# Patient Record
Sex: Male | Born: 1935 | Race: White | Hispanic: No | Smoking: Never smoker
Health system: Southern US, Community
[De-identification: ages and names within clinical notes are randomized; demographics above are authoritative.]

## PROBLEM LIST (undated history)

## (undated) DIAGNOSIS — M25559 Pain in unspecified hip: Secondary | ICD-10-CM

## (undated) DIAGNOSIS — I10 Essential (primary) hypertension: Secondary | ICD-10-CM

## (undated) DIAGNOSIS — K5900 Constipation, unspecified: Secondary | ICD-10-CM

## (undated) HISTORY — PX: BACK SURGERY: SHX140

---

## 2003-10-25 ENCOUNTER — Ambulatory Visit (HOSPITAL_COMMUNITY): Admission: RE | Admit: 2003-10-25 | Discharge: 2003-10-25 | Payer: Self-pay | Admitting: Family Medicine

## 2008-05-24 ENCOUNTER — Encounter: Admission: RE | Admit: 2008-05-24 | Discharge: 2008-05-24 | Payer: Self-pay | Admitting: Family Medicine

## 2015-06-11 DIAGNOSIS — N400 Enlarged prostate without lower urinary tract symptoms: Secondary | ICD-10-CM | POA: Diagnosis not present

## 2015-06-11 DIAGNOSIS — I10 Essential (primary) hypertension: Secondary | ICD-10-CM | POA: Diagnosis not present

## 2015-06-11 DIAGNOSIS — R252 Cramp and spasm: Secondary | ICD-10-CM | POA: Diagnosis not present

## 2015-06-21 DIAGNOSIS — M5416 Radiculopathy, lumbar region: Secondary | ICD-10-CM | POA: Diagnosis not present

## 2015-07-18 DIAGNOSIS — M5136 Other intervertebral disc degeneration, lumbar region: Secondary | ICD-10-CM | POA: Diagnosis not present

## 2015-08-06 DIAGNOSIS — M4806 Spinal stenosis, lumbar region: Secondary | ICD-10-CM | POA: Diagnosis not present

## 2015-08-06 DIAGNOSIS — M4126 Other idiopathic scoliosis, lumbar region: Secondary | ICD-10-CM | POA: Diagnosis not present

## 2015-08-06 DIAGNOSIS — M5136 Other intervertebral disc degeneration, lumbar region: Secondary | ICD-10-CM | POA: Diagnosis not present

## 2015-08-15 DIAGNOSIS — M545 Low back pain: Secondary | ICD-10-CM | POA: Diagnosis not present

## 2015-08-21 DIAGNOSIS — M4806 Spinal stenosis, lumbar region: Secondary | ICD-10-CM | POA: Diagnosis not present

## 2015-08-21 DIAGNOSIS — M5136 Other intervertebral disc degeneration, lumbar region: Secondary | ICD-10-CM | POA: Diagnosis not present

## 2015-08-21 DIAGNOSIS — M4126 Other idiopathic scoliosis, lumbar region: Secondary | ICD-10-CM | POA: Diagnosis not present

## 2015-08-28 DIAGNOSIS — M5136 Other intervertebral disc degeneration, lumbar region: Secondary | ICD-10-CM | POA: Diagnosis not present

## 2015-09-02 DIAGNOSIS — Z0000 Encounter for general adult medical examination without abnormal findings: Secondary | ICD-10-CM | POA: Diagnosis not present

## 2015-09-02 DIAGNOSIS — N138 Other obstructive and reflux uropathy: Secondary | ICD-10-CM | POA: Diagnosis not present

## 2015-09-02 DIAGNOSIS — N401 Enlarged prostate with lower urinary tract symptoms: Secondary | ICD-10-CM | POA: Diagnosis not present

## 2015-09-02 DIAGNOSIS — N281 Cyst of kidney, acquired: Secondary | ICD-10-CM | POA: Diagnosis not present

## 2015-09-02 DIAGNOSIS — N433 Hydrocele, unspecified: Secondary | ICD-10-CM | POA: Diagnosis not present

## 2015-09-09 DIAGNOSIS — N281 Cyst of kidney, acquired: Secondary | ICD-10-CM | POA: Diagnosis not present

## 2015-10-01 DIAGNOSIS — M4806 Spinal stenosis, lumbar region: Secondary | ICD-10-CM | POA: Diagnosis not present

## 2015-10-15 DIAGNOSIS — D3001 Benign neoplasm of right kidney: Secondary | ICD-10-CM | POA: Diagnosis not present

## 2015-10-24 DIAGNOSIS — I10 Essential (primary) hypertension: Secondary | ICD-10-CM | POA: Diagnosis not present

## 2015-10-24 DIAGNOSIS — E782 Mixed hyperlipidemia: Secondary | ICD-10-CM | POA: Diagnosis not present

## 2015-10-24 DIAGNOSIS — K219 Gastro-esophageal reflux disease without esophagitis: Secondary | ICD-10-CM | POA: Diagnosis not present

## 2015-10-24 DIAGNOSIS — E559 Vitamin D deficiency, unspecified: Secondary | ICD-10-CM | POA: Diagnosis not present

## 2015-10-24 DIAGNOSIS — N400 Enlarged prostate without lower urinary tract symptoms: Secondary | ICD-10-CM | POA: Diagnosis not present

## 2015-10-24 DIAGNOSIS — M5416 Radiculopathy, lumbar region: Secondary | ICD-10-CM | POA: Diagnosis not present

## 2015-10-31 DIAGNOSIS — M5416 Radiculopathy, lumbar region: Secondary | ICD-10-CM | POA: Diagnosis not present

## 2015-11-13 DIAGNOSIS — M4806 Spinal stenosis, lumbar region: Secondary | ICD-10-CM | POA: Diagnosis not present

## 2015-12-02 ENCOUNTER — Other Ambulatory Visit: Payer: Self-pay | Admitting: Family Medicine

## 2015-12-02 ENCOUNTER — Ambulatory Visit
Admission: RE | Admit: 2015-12-02 | Discharge: 2015-12-02 | Disposition: A | Discharge status: Home / Self Care | Payer: Medicare Other | Source: Ambulatory Visit | Attending: Family Medicine | Admitting: Family Medicine

## 2015-12-02 DIAGNOSIS — K5900 Constipation, unspecified: Secondary | ICD-10-CM

## 2015-12-02 DIAGNOSIS — I10 Essential (primary) hypertension: Secondary | ICD-10-CM | POA: Diagnosis not present

## 2015-12-02 DIAGNOSIS — M5416 Radiculopathy, lumbar region: Secondary | ICD-10-CM | POA: Diagnosis not present

## 2015-12-19 DIAGNOSIS — H01024 Squamous blepharitis left upper eyelid: Secondary | ICD-10-CM | POA: Diagnosis not present

## 2015-12-19 DIAGNOSIS — H01022 Squamous blepharitis right lower eyelid: Secondary | ICD-10-CM | POA: Diagnosis not present

## 2015-12-19 DIAGNOSIS — H01025 Squamous blepharitis left lower eyelid: Secondary | ICD-10-CM | POA: Diagnosis not present

## 2015-12-19 DIAGNOSIS — H01021 Squamous blepharitis right upper eyelid: Secondary | ICD-10-CM | POA: Diagnosis not present

## 2015-12-19 DIAGNOSIS — H26491 Other secondary cataract, right eye: Secondary | ICD-10-CM | POA: Diagnosis not present

## 2015-12-19 DIAGNOSIS — Z961 Presence of intraocular lens: Secondary | ICD-10-CM | POA: Diagnosis not present

## 2015-12-19 DIAGNOSIS — H04123 Dry eye syndrome of bilateral lacrimal glands: Secondary | ICD-10-CM | POA: Diagnosis not present

## 2015-12-19 DIAGNOSIS — H10413 Chronic giant papillary conjunctivitis, bilateral: Secondary | ICD-10-CM | POA: Diagnosis not present

## 2015-12-27 DIAGNOSIS — H26491 Other secondary cataract, right eye: Secondary | ICD-10-CM | POA: Diagnosis not present

## 2016-01-14 DIAGNOSIS — M5416 Radiculopathy, lumbar region: Secondary | ICD-10-CM | POA: Diagnosis not present

## 2016-02-20 ENCOUNTER — Encounter: Payer: Self-pay | Admitting: Internal Medicine

## 2016-04-16 DIAGNOSIS — L570 Actinic keratosis: Secondary | ICD-10-CM | POA: Diagnosis not present

## 2016-04-16 DIAGNOSIS — D1801 Hemangioma of skin and subcutaneous tissue: Secondary | ICD-10-CM | POA: Diagnosis not present

## 2016-04-16 DIAGNOSIS — L821 Other seborrheic keratosis: Secondary | ICD-10-CM | POA: Diagnosis not present

## 2016-04-16 DIAGNOSIS — Z85828 Personal history of other malignant neoplasm of skin: Secondary | ICD-10-CM | POA: Diagnosis not present

## 2016-04-16 DIAGNOSIS — L814 Other melanin hyperpigmentation: Secondary | ICD-10-CM | POA: Diagnosis not present

## 2016-04-16 DIAGNOSIS — D225 Melanocytic nevi of trunk: Secondary | ICD-10-CM | POA: Diagnosis not present

## 2016-04-21 ENCOUNTER — Ambulatory Visit: Payer: Medicare Other | Admitting: Internal Medicine

## 2016-04-21 DIAGNOSIS — K219 Gastro-esophageal reflux disease without esophagitis: Secondary | ICD-10-CM | POA: Diagnosis not present

## 2016-04-21 DIAGNOSIS — Z23 Encounter for immunization: Secondary | ICD-10-CM | POA: Diagnosis not present

## 2016-04-21 DIAGNOSIS — N400 Enlarged prostate without lower urinary tract symptoms: Secondary | ICD-10-CM | POA: Diagnosis not present

## 2016-04-21 DIAGNOSIS — E559 Vitamin D deficiency, unspecified: Secondary | ICD-10-CM | POA: Diagnosis not present

## 2016-04-21 DIAGNOSIS — E782 Mixed hyperlipidemia: Secondary | ICD-10-CM | POA: Diagnosis not present

## 2016-04-21 DIAGNOSIS — M5416 Radiculopathy, lumbar region: Secondary | ICD-10-CM | POA: Diagnosis not present

## 2016-04-21 DIAGNOSIS — I10 Essential (primary) hypertension: Secondary | ICD-10-CM | POA: Diagnosis not present

## 2016-05-11 DIAGNOSIS — M7138 Other bursal cyst, other site: Secondary | ICD-10-CM | POA: Diagnosis not present

## 2016-05-11 DIAGNOSIS — M5126 Other intervertebral disc displacement, lumbar region: Secondary | ICD-10-CM | POA: Diagnosis not present

## 2016-05-12 ENCOUNTER — Other Ambulatory Visit: Payer: Self-pay | Admitting: Neurosurgery

## 2016-05-12 DIAGNOSIS — M7138 Other bursal cyst, other site: Secondary | ICD-10-CM

## 2016-05-16 ENCOUNTER — Ambulatory Visit
Admission: RE | Admit: 2016-05-16 | Discharge: 2016-05-16 | Disposition: A | Discharge status: Home / Self Care | Payer: Medicare Other | Source: Ambulatory Visit | Attending: Neurosurgery | Admitting: Neurosurgery

## 2016-05-16 DIAGNOSIS — M48061 Spinal stenosis, lumbar region without neurogenic claudication: Secondary | ICD-10-CM | POA: Diagnosis not present

## 2016-05-16 DIAGNOSIS — M7138 Other bursal cyst, other site: Secondary | ICD-10-CM

## 2016-06-12 DIAGNOSIS — M7138 Other bursal cyst, other site: Secondary | ICD-10-CM | POA: Diagnosis not present

## 2016-06-12 DIAGNOSIS — M5126 Other intervertebral disc displacement, lumbar region: Secondary | ICD-10-CM | POA: Diagnosis not present

## 2016-07-23 ENCOUNTER — Other Ambulatory Visit: Payer: Self-pay | Admitting: Nurse Practitioner

## 2016-07-23 DIAGNOSIS — M5126 Other intervertebral disc displacement, lumbar region: Secondary | ICD-10-CM

## 2016-07-23 DIAGNOSIS — M7138 Other bursal cyst, other site: Secondary | ICD-10-CM | POA: Diagnosis not present

## 2016-07-24 ENCOUNTER — Ambulatory Visit
Admission: RE | Admit: 2016-07-24 | Discharge: 2016-07-24 | Disposition: A | Discharge status: Home / Self Care | Payer: Medicare Other | Source: Ambulatory Visit | Attending: Nurse Practitioner | Admitting: Nurse Practitioner

## 2016-07-24 DIAGNOSIS — M5126 Other intervertebral disc displacement, lumbar region: Secondary | ICD-10-CM | POA: Diagnosis not present

## 2016-07-24 MED ORDER — METHYLPREDNISOLONE ACETATE 40 MG/ML INJ SUSP (RADIOLOG
120.0000 mg | Freq: Once | INTRAMUSCULAR | Status: AC
  Administered 2016-07-24: 120 mg via EPIDURAL

## 2016-07-24 MED ORDER — IOPAMIDOL (ISOVUE-M 200) INJECTION 41%
1.0000 mL | Freq: Once | INTRAMUSCULAR | Status: AC
  Administered 2016-07-24: 1 mL via EPIDURAL

## 2016-07-24 NOTE — Discharge Instructions (Signed)

## 2016-08-06 DIAGNOSIS — M5126 Other intervertebral disc displacement, lumbar region: Secondary | ICD-10-CM | POA: Diagnosis not present

## 2016-08-06 DIAGNOSIS — M7138 Other bursal cyst, other site: Secondary | ICD-10-CM | POA: Diagnosis not present

## 2016-08-14 DIAGNOSIS — Z7982 Long term (current) use of aspirin: Secondary | ICD-10-CM | POA: Diagnosis not present

## 2016-08-14 DIAGNOSIS — Z79899 Other long term (current) drug therapy: Secondary | ICD-10-CM | POA: Diagnosis not present

## 2016-08-14 DIAGNOSIS — E875 Hyperkalemia: Secondary | ICD-10-CM | POA: Diagnosis not present

## 2016-08-14 DIAGNOSIS — M47816 Spondylosis without myelopathy or radiculopathy, lumbar region: Secondary | ICD-10-CM | POA: Diagnosis not present

## 2016-08-14 DIAGNOSIS — N400 Enlarged prostate without lower urinary tract symptoms: Secondary | ICD-10-CM | POA: Diagnosis not present

## 2016-08-14 DIAGNOSIS — M48061 Spinal stenosis, lumbar region without neurogenic claudication: Secondary | ICD-10-CM | POA: Diagnosis not present

## 2016-08-14 DIAGNOSIS — M6281 Muscle weakness (generalized): Secondary | ICD-10-CM | POA: Diagnosis not present

## 2016-08-14 DIAGNOSIS — R262 Difficulty in walking, not elsewhere classified: Secondary | ICD-10-CM | POA: Diagnosis not present

## 2016-08-14 DIAGNOSIS — M5126 Other intervertebral disc displacement, lumbar region: Secondary | ICD-10-CM | POA: Diagnosis not present

## 2016-08-14 DIAGNOSIS — I10 Essential (primary) hypertension: Secondary | ICD-10-CM | POA: Diagnosis not present

## 2016-08-17 DIAGNOSIS — R262 Difficulty in walking, not elsewhere classified: Secondary | ICD-10-CM | POA: Diagnosis not present

## 2016-08-17 DIAGNOSIS — I10 Essential (primary) hypertension: Secondary | ICD-10-CM | POA: Diagnosis not present

## 2016-08-17 DIAGNOSIS — M5136 Other intervertebral disc degeneration, lumbar region: Secondary | ICD-10-CM | POA: Diagnosis not present

## 2016-08-17 DIAGNOSIS — Z79899 Other long term (current) drug therapy: Secondary | ICD-10-CM | POA: Diagnosis not present

## 2016-08-17 DIAGNOSIS — Z7982 Long term (current) use of aspirin: Secondary | ICD-10-CM | POA: Diagnosis not present

## 2016-08-17 DIAGNOSIS — M6281 Muscle weakness (generalized): Secondary | ICD-10-CM | POA: Diagnosis not present

## 2016-08-17 DIAGNOSIS — M48061 Spinal stenosis, lumbar region without neurogenic claudication: Secondary | ICD-10-CM | POA: Diagnosis not present

## 2016-08-17 DIAGNOSIS — E875 Hyperkalemia: Secondary | ICD-10-CM | POA: Diagnosis not present

## 2016-08-17 DIAGNOSIS — M5126 Other intervertebral disc displacement, lumbar region: Secondary | ICD-10-CM | POA: Diagnosis not present

## 2016-08-17 DIAGNOSIS — N400 Enlarged prostate without lower urinary tract symptoms: Secondary | ICD-10-CM | POA: Diagnosis not present

## 2016-08-17 DIAGNOSIS — M47816 Spondylosis without myelopathy or radiculopathy, lumbar region: Secondary | ICD-10-CM | POA: Diagnosis not present

## 2016-08-18 DIAGNOSIS — M48061 Spinal stenosis, lumbar region without neurogenic claudication: Secondary | ICD-10-CM | POA: Diagnosis not present

## 2016-08-18 DIAGNOSIS — M47816 Spondylosis without myelopathy or radiculopathy, lumbar region: Secondary | ICD-10-CM | POA: Diagnosis not present

## 2016-08-18 DIAGNOSIS — Z79899 Other long term (current) drug therapy: Secondary | ICD-10-CM | POA: Diagnosis not present

## 2016-08-18 DIAGNOSIS — R262 Difficulty in walking, not elsewhere classified: Secondary | ICD-10-CM | POA: Diagnosis not present

## 2016-08-18 DIAGNOSIS — I10 Essential (primary) hypertension: Secondary | ICD-10-CM | POA: Diagnosis not present

## 2016-08-18 DIAGNOSIS — M5126 Other intervertebral disc displacement, lumbar region: Secondary | ICD-10-CM | POA: Diagnosis not present

## 2016-08-18 DIAGNOSIS — N400 Enlarged prostate without lower urinary tract symptoms: Secondary | ICD-10-CM | POA: Diagnosis not present

## 2016-08-18 DIAGNOSIS — M6281 Muscle weakness (generalized): Secondary | ICD-10-CM | POA: Diagnosis not present

## 2016-08-18 DIAGNOSIS — Z7982 Long term (current) use of aspirin: Secondary | ICD-10-CM | POA: Diagnosis not present

## 2016-08-18 DIAGNOSIS — E875 Hyperkalemia: Secondary | ICD-10-CM | POA: Diagnosis not present

## 2016-08-19 DIAGNOSIS — M5126 Other intervertebral disc displacement, lumbar region: Secondary | ICD-10-CM | POA: Diagnosis not present

## 2016-08-19 DIAGNOSIS — R262 Difficulty in walking, not elsewhere classified: Secondary | ICD-10-CM | POA: Diagnosis not present

## 2016-08-19 DIAGNOSIS — M519 Unspecified thoracic, thoracolumbar and lumbosacral intervertebral disc disorder: Secondary | ICD-10-CM | POA: Diagnosis not present

## 2016-08-19 DIAGNOSIS — I10 Essential (primary) hypertension: Secondary | ICD-10-CM | POA: Diagnosis not present

## 2016-08-19 DIAGNOSIS — Z7982 Long term (current) use of aspirin: Secondary | ICD-10-CM | POA: Diagnosis not present

## 2016-08-19 DIAGNOSIS — M47816 Spondylosis without myelopathy or radiculopathy, lumbar region: Secondary | ICD-10-CM | POA: Diagnosis not present

## 2016-08-19 DIAGNOSIS — Z79899 Other long term (current) drug therapy: Secondary | ICD-10-CM | POA: Diagnosis not present

## 2016-08-19 DIAGNOSIS — M48061 Spinal stenosis, lumbar region without neurogenic claudication: Secondary | ICD-10-CM | POA: Diagnosis not present

## 2016-08-19 DIAGNOSIS — M6281 Muscle weakness (generalized): Secondary | ICD-10-CM | POA: Diagnosis not present

## 2016-08-19 DIAGNOSIS — N400 Enlarged prostate without lower urinary tract symptoms: Secondary | ICD-10-CM | POA: Diagnosis not present

## 2016-08-19 DIAGNOSIS — E875 Hyperkalemia: Secondary | ICD-10-CM | POA: Diagnosis not present

## 2016-08-20 DIAGNOSIS — M5126 Other intervertebral disc displacement, lumbar region: Secondary | ICD-10-CM | POA: Diagnosis not present

## 2016-08-20 DIAGNOSIS — R262 Difficulty in walking, not elsewhere classified: Secondary | ICD-10-CM | POA: Diagnosis not present

## 2016-08-20 DIAGNOSIS — N400 Enlarged prostate without lower urinary tract symptoms: Secondary | ICD-10-CM | POA: Diagnosis not present

## 2016-08-20 DIAGNOSIS — E875 Hyperkalemia: Secondary | ICD-10-CM | POA: Diagnosis not present

## 2016-08-20 DIAGNOSIS — M47816 Spondylosis without myelopathy or radiculopathy, lumbar region: Secondary | ICD-10-CM | POA: Diagnosis not present

## 2016-08-20 DIAGNOSIS — I10 Essential (primary) hypertension: Secondary | ICD-10-CM | POA: Diagnosis not present

## 2016-08-20 DIAGNOSIS — Z79899 Other long term (current) drug therapy: Secondary | ICD-10-CM | POA: Diagnosis not present

## 2016-08-20 DIAGNOSIS — M6281 Muscle weakness (generalized): Secondary | ICD-10-CM | POA: Diagnosis not present

## 2016-08-20 DIAGNOSIS — Z7982 Long term (current) use of aspirin: Secondary | ICD-10-CM | POA: Diagnosis not present

## 2016-08-20 DIAGNOSIS — M48061 Spinal stenosis, lumbar region without neurogenic claudication: Secondary | ICD-10-CM | POA: Diagnosis not present

## 2016-08-21 DIAGNOSIS — Z79899 Other long term (current) drug therapy: Secondary | ICD-10-CM | POA: Diagnosis not present

## 2016-08-21 DIAGNOSIS — M48061 Spinal stenosis, lumbar region without neurogenic claudication: Secondary | ICD-10-CM | POA: Diagnosis not present

## 2016-08-21 DIAGNOSIS — G629 Polyneuropathy, unspecified: Secondary | ICD-10-CM | POA: Diagnosis not present

## 2016-08-21 DIAGNOSIS — R278 Other lack of coordination: Secondary | ICD-10-CM | POA: Diagnosis not present

## 2016-08-21 DIAGNOSIS — M47816 Spondylosis without myelopathy or radiculopathy, lumbar region: Secondary | ICD-10-CM | POA: Diagnosis not present

## 2016-08-21 DIAGNOSIS — M5126 Other intervertebral disc displacement, lumbar region: Secondary | ICD-10-CM | POA: Diagnosis not present

## 2016-08-21 DIAGNOSIS — Z7982 Long term (current) use of aspirin: Secondary | ICD-10-CM | POA: Diagnosis not present

## 2016-08-21 DIAGNOSIS — R2689 Other abnormalities of gait and mobility: Secondary | ICD-10-CM | POA: Diagnosis not present

## 2016-08-21 DIAGNOSIS — Z4789 Encounter for other orthopedic aftercare: Secondary | ICD-10-CM | POA: Diagnosis not present

## 2016-08-21 DIAGNOSIS — R262 Difficulty in walking, not elsewhere classified: Secondary | ICD-10-CM | POA: Diagnosis not present

## 2016-08-21 DIAGNOSIS — M6281 Muscle weakness (generalized): Secondary | ICD-10-CM | POA: Diagnosis not present

## 2016-08-21 DIAGNOSIS — E875 Hyperkalemia: Secondary | ICD-10-CM | POA: Diagnosis not present

## 2016-08-21 DIAGNOSIS — N400 Enlarged prostate without lower urinary tract symptoms: Secondary | ICD-10-CM | POA: Diagnosis not present

## 2016-08-21 DIAGNOSIS — M47896 Other spondylosis, lumbar region: Secondary | ICD-10-CM | POA: Diagnosis not present

## 2016-08-21 DIAGNOSIS — I10 Essential (primary) hypertension: Secondary | ICD-10-CM | POA: Diagnosis not present

## 2016-09-07 DIAGNOSIS — Z981 Arthrodesis status: Secondary | ICD-10-CM | POA: Diagnosis not present

## 2016-09-07 DIAGNOSIS — Z4789 Encounter for other orthopedic aftercare: Secondary | ICD-10-CM | POA: Diagnosis not present

## 2016-09-07 DIAGNOSIS — N400 Enlarged prostate without lower urinary tract symptoms: Secondary | ICD-10-CM | POA: Diagnosis not present

## 2016-09-07 DIAGNOSIS — R2689 Other abnormalities of gait and mobility: Secondary | ICD-10-CM | POA: Diagnosis not present

## 2016-09-07 DIAGNOSIS — I10 Essential (primary) hypertension: Secondary | ICD-10-CM | POA: Diagnosis not present

## 2016-09-15 DIAGNOSIS — N400 Enlarged prostate without lower urinary tract symptoms: Secondary | ICD-10-CM | POA: Diagnosis not present

## 2016-09-15 DIAGNOSIS — R609 Edema, unspecified: Secondary | ICD-10-CM | POA: Diagnosis not present

## 2016-09-15 DIAGNOSIS — M5416 Radiculopathy, lumbar region: Secondary | ICD-10-CM | POA: Diagnosis not present

## 2016-09-15 DIAGNOSIS — I10 Essential (primary) hypertension: Secondary | ICD-10-CM | POA: Diagnosis not present

## 2016-09-24 DIAGNOSIS — E785 Hyperlipidemia, unspecified: Secondary | ICD-10-CM | POA: Diagnosis not present

## 2016-09-24 DIAGNOSIS — I4510 Unspecified right bundle-branch block: Secondary | ICD-10-CM | POA: Diagnosis not present

## 2016-09-24 DIAGNOSIS — R609 Edema, unspecified: Secondary | ICD-10-CM | POA: Diagnosis not present

## 2016-09-24 DIAGNOSIS — R9431 Abnormal electrocardiogram [ECG] [EKG]: Secondary | ICD-10-CM | POA: Diagnosis not present

## 2016-09-24 DIAGNOSIS — I10 Essential (primary) hypertension: Secondary | ICD-10-CM | POA: Diagnosis not present

## 2016-10-05 DIAGNOSIS — R600 Localized edema: Secondary | ICD-10-CM | POA: Diagnosis not present

## 2016-10-08 IMAGING — CR DG ABDOMEN 2V
2 series · 2 of 2 positions shown · non-contrast
Comparison: CT abdomen 09/09/2015

CLINICAL DATA: Constipation

EXAM:
ABDOMEN - 2 VIEW

[w abdomen upright *]
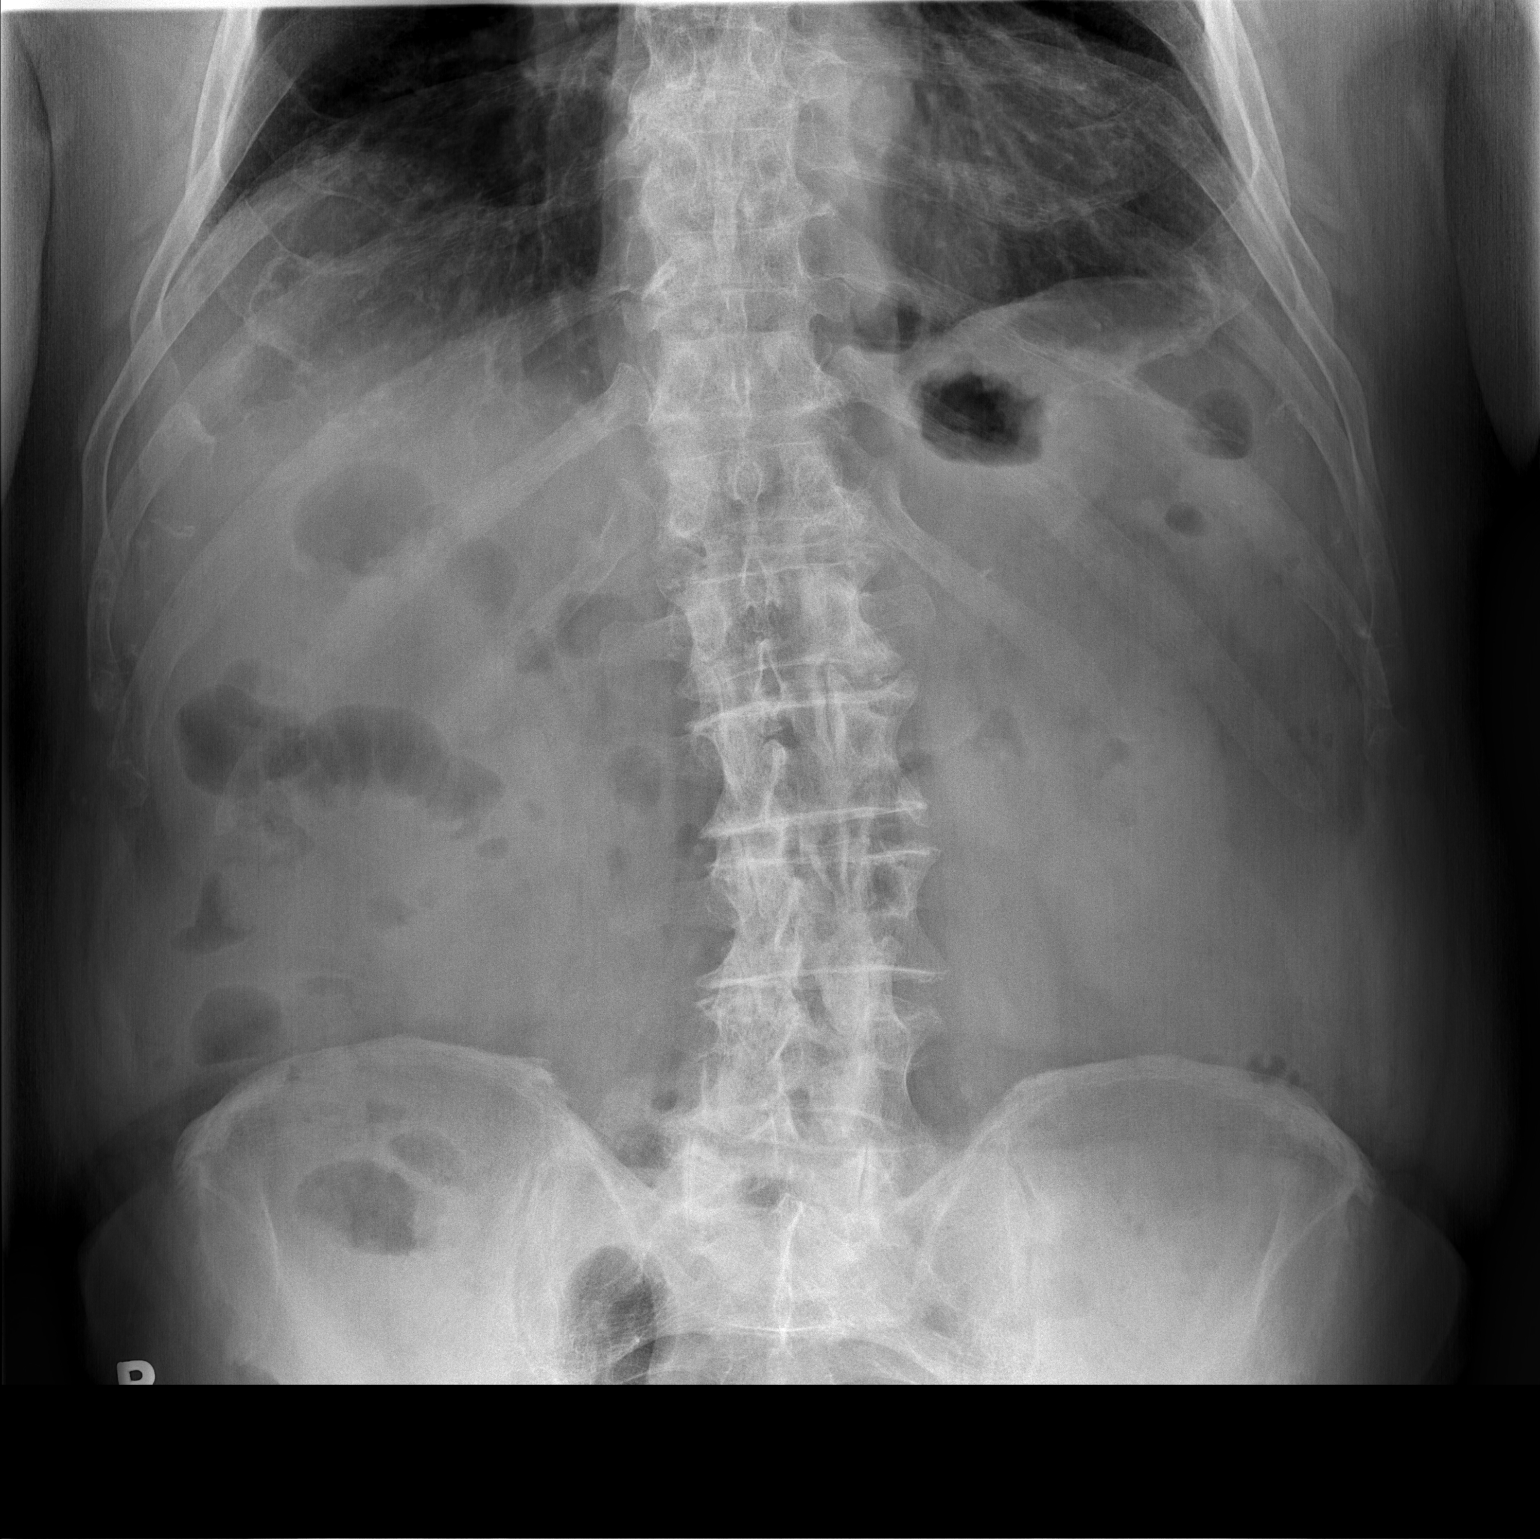

[t abdomen supine]
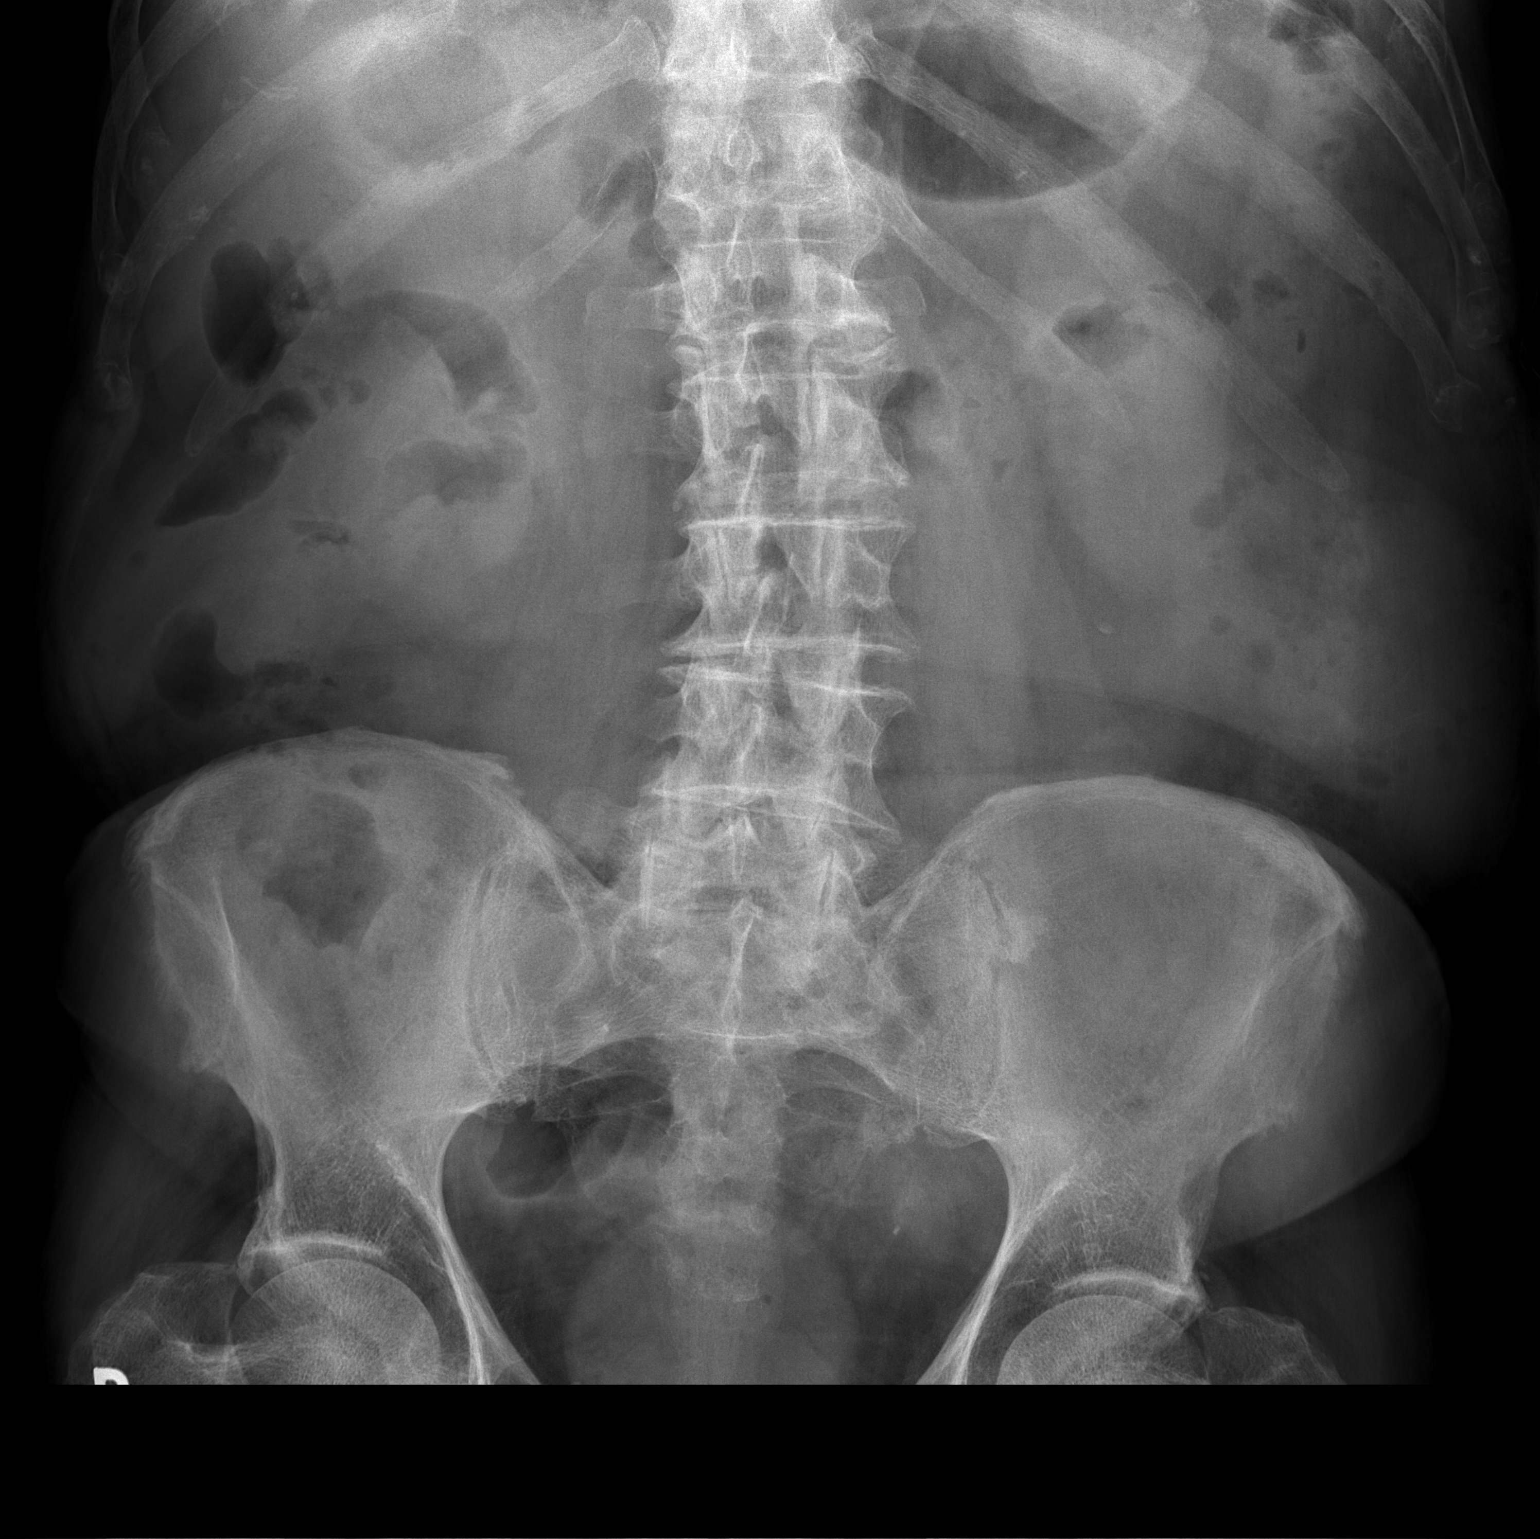

[2 of 2 positions shown; findings below may reference images not displayed]

FINDINGS: Nonobstructive bowel gas pattern. No free air. No dilated bowel
loops.

Lumbar levoscoliosis. No acute bony abnormality. No urinary tract
calculi.
IMPRESSION: Negative.

## 2016-10-15 DIAGNOSIS — L570 Actinic keratosis: Secondary | ICD-10-CM | POA: Diagnosis not present

## 2016-10-15 DIAGNOSIS — D1801 Hemangioma of skin and subcutaneous tissue: Secondary | ICD-10-CM | POA: Diagnosis not present

## 2016-10-15 DIAGNOSIS — D225 Melanocytic nevi of trunk: Secondary | ICD-10-CM | POA: Diagnosis not present

## 2016-10-15 DIAGNOSIS — L821 Other seborrheic keratosis: Secondary | ICD-10-CM | POA: Diagnosis not present

## 2016-10-27 DIAGNOSIS — I4510 Unspecified right bundle-branch block: Secondary | ICD-10-CM | POA: Diagnosis not present

## 2016-10-27 DIAGNOSIS — R609 Edema, unspecified: Secondary | ICD-10-CM | POA: Diagnosis not present

## 2016-10-27 DIAGNOSIS — I119 Hypertensive heart disease without heart failure: Secondary | ICD-10-CM | POA: Diagnosis not present

## 2016-10-27 DIAGNOSIS — R9431 Abnormal electrocardiogram [ECG] [EKG]: Secondary | ICD-10-CM | POA: Diagnosis not present

## 2016-10-28 ENCOUNTER — Other Ambulatory Visit: Payer: Self-pay | Admitting: Cardiology

## 2016-10-28 DIAGNOSIS — M7989 Other specified soft tissue disorders: Secondary | ICD-10-CM

## 2016-10-28 DIAGNOSIS — M79669 Pain in unspecified lower leg: Secondary | ICD-10-CM

## 2016-10-29 ENCOUNTER — Ambulatory Visit (HOSPITAL_COMMUNITY)
Admission: RE | Admit: 2016-10-29 | Discharge: 2016-10-29 | Disposition: A | Discharge status: Home / Self Care | Payer: Medicare Other | Source: Ambulatory Visit | Attending: Vascular Surgery | Admitting: Vascular Surgery

## 2016-10-29 DIAGNOSIS — M79661 Pain in right lower leg: Secondary | ICD-10-CM | POA: Insufficient documentation

## 2016-10-29 DIAGNOSIS — M7989 Other specified soft tissue disorders: Secondary | ICD-10-CM | POA: Diagnosis not present

## 2016-10-29 DIAGNOSIS — M79669 Pain in unspecified lower leg: Secondary | ICD-10-CM | POA: Diagnosis not present

## 2016-10-29 DIAGNOSIS — M79662 Pain in left lower leg: Secondary | ICD-10-CM | POA: Diagnosis present

## 2017-02-02 DIAGNOSIS — R9431 Abnormal electrocardiogram [ECG] [EKG]: Secondary | ICD-10-CM | POA: Diagnosis not present

## 2017-02-02 DIAGNOSIS — I4510 Unspecified right bundle-branch block: Secondary | ICD-10-CM | POA: Diagnosis not present

## 2017-02-02 DIAGNOSIS — R609 Edema, unspecified: Secondary | ICD-10-CM | POA: Diagnosis not present

## 2017-02-02 DIAGNOSIS — I119 Hypertensive heart disease without heart failure: Secondary | ICD-10-CM | POA: Diagnosis not present

## 2017-02-23 DIAGNOSIS — L570 Actinic keratosis: Secondary | ICD-10-CM | POA: Diagnosis not present

## 2017-02-23 DIAGNOSIS — C44311 Basal cell carcinoma of skin of nose: Secondary | ICD-10-CM | POA: Diagnosis not present

## 2017-02-23 DIAGNOSIS — D485 Neoplasm of uncertain behavior of skin: Secondary | ICD-10-CM | POA: Diagnosis not present

## 2017-03-18 DIAGNOSIS — I10 Essential (primary) hypertension: Secondary | ICD-10-CM | POA: Diagnosis not present

## 2017-03-18 DIAGNOSIS — N400 Enlarged prostate without lower urinary tract symptoms: Secondary | ICD-10-CM | POA: Diagnosis not present

## 2017-03-18 DIAGNOSIS — R609 Edema, unspecified: Secondary | ICD-10-CM | POA: Diagnosis not present

## 2017-03-18 DIAGNOSIS — E559 Vitamin D deficiency, unspecified: Secondary | ICD-10-CM | POA: Diagnosis not present

## 2017-03-23 DIAGNOSIS — M4716 Other spondylosis with myelopathy, lumbar region: Secondary | ICD-10-CM | POA: Diagnosis not present

## 2017-04-19 DIAGNOSIS — D229 Melanocytic nevi, unspecified: Secondary | ICD-10-CM | POA: Diagnosis not present

## 2017-04-19 DIAGNOSIS — L821 Other seborrheic keratosis: Secondary | ICD-10-CM | POA: Diagnosis not present

## 2017-04-19 DIAGNOSIS — Z85828 Personal history of other malignant neoplasm of skin: Secondary | ICD-10-CM | POA: Diagnosis not present

## 2017-05-11 DIAGNOSIS — D225 Melanocytic nevi of trunk: Secondary | ICD-10-CM | POA: Diagnosis not present

## 2017-05-11 DIAGNOSIS — L570 Actinic keratosis: Secondary | ICD-10-CM | POA: Diagnosis not present

## 2017-05-11 DIAGNOSIS — L821 Other seborrheic keratosis: Secondary | ICD-10-CM | POA: Diagnosis not present

## 2017-06-18 DIAGNOSIS — I10 Essential (primary) hypertension: Secondary | ICD-10-CM | POA: Diagnosis not present

## 2017-06-18 DIAGNOSIS — N400 Enlarged prostate without lower urinary tract symptoms: Secondary | ICD-10-CM | POA: Diagnosis not present

## 2017-06-18 DIAGNOSIS — E559 Vitamin D deficiency, unspecified: Secondary | ICD-10-CM | POA: Diagnosis not present

## 2017-07-23 DIAGNOSIS — I10 Essential (primary) hypertension: Secondary | ICD-10-CM | POA: Diagnosis not present

## 2017-08-03 DIAGNOSIS — I4510 Unspecified right bundle-branch block: Secondary | ICD-10-CM | POA: Diagnosis not present

## 2017-08-03 DIAGNOSIS — I872 Venous insufficiency (chronic) (peripheral): Secondary | ICD-10-CM | POA: Diagnosis not present

## 2017-08-03 DIAGNOSIS — I119 Hypertensive heart disease without heart failure: Secondary | ICD-10-CM | POA: Diagnosis not present

## 2017-08-03 DIAGNOSIS — R609 Edema, unspecified: Secondary | ICD-10-CM | POA: Diagnosis not present

## 2017-08-19 DIAGNOSIS — R609 Edema, unspecified: Secondary | ICD-10-CM | POA: Diagnosis not present

## 2017-08-19 DIAGNOSIS — I10 Essential (primary) hypertension: Secondary | ICD-10-CM | POA: Diagnosis not present

## 2017-08-19 DIAGNOSIS — R1310 Dysphagia, unspecified: Secondary | ICD-10-CM | POA: Diagnosis not present

## 2017-08-31 DIAGNOSIS — D3132 Benign neoplasm of left choroid: Secondary | ICD-10-CM | POA: Diagnosis not present

## 2017-08-31 DIAGNOSIS — H02103 Unspecified ectropion of right eye, unspecified eyelid: Secondary | ICD-10-CM | POA: Diagnosis not present

## 2017-08-31 DIAGNOSIS — Z961 Presence of intraocular lens: Secondary | ICD-10-CM | POA: Diagnosis not present

## 2017-08-31 DIAGNOSIS — H26492 Other secondary cataract, left eye: Secondary | ICD-10-CM | POA: Diagnosis not present

## 2017-09-08 DIAGNOSIS — H26492 Other secondary cataract, left eye: Secondary | ICD-10-CM | POA: Diagnosis not present

## 2017-11-08 DIAGNOSIS — D1801 Hemangioma of skin and subcutaneous tissue: Secondary | ICD-10-CM | POA: Diagnosis not present

## 2017-11-08 DIAGNOSIS — L821 Other seborrheic keratosis: Secondary | ICD-10-CM | POA: Diagnosis not present

## 2017-11-08 DIAGNOSIS — L814 Other melanin hyperpigmentation: Secondary | ICD-10-CM | POA: Diagnosis not present

## 2017-11-08 DIAGNOSIS — D229 Melanocytic nevi, unspecified: Secondary | ICD-10-CM | POA: Diagnosis not present

## 2017-12-16 DIAGNOSIS — N400 Enlarged prostate without lower urinary tract symptoms: Secondary | ICD-10-CM | POA: Diagnosis not present

## 2017-12-16 DIAGNOSIS — M5416 Radiculopathy, lumbar region: Secondary | ICD-10-CM | POA: Diagnosis not present

## 2017-12-16 DIAGNOSIS — E559 Vitamin D deficiency, unspecified: Secondary | ICD-10-CM | POA: Diagnosis not present

## 2017-12-16 DIAGNOSIS — I10 Essential (primary) hypertension: Secondary | ICD-10-CM | POA: Diagnosis not present

## 2018-02-10 DIAGNOSIS — M79605 Pain in left leg: Secondary | ICD-10-CM | POA: Diagnosis not present

## 2018-02-10 DIAGNOSIS — Z23 Encounter for immunization: Secondary | ICD-10-CM | POA: Diagnosis not present

## 2018-02-11 ENCOUNTER — Telehealth: Payer: Self-pay

## 2018-02-11 DIAGNOSIS — M7989 Other specified soft tissue disorders: Secondary | ICD-10-CM | POA: Diagnosis not present

## 2018-02-16 ENCOUNTER — Telehealth: Payer: Self-pay | Admitting: Cardiology

## 2018-02-16 NOTE — Telephone Encounter (Signed)
° ° ° °  1. Which medications need to be refilled? (please list name of each medication and dose if known) furosemide 20mg  tablets  2. Which pharmacy/location (including street and city if local pharmacy) is medication to be sent to? [facility] on [address] [place]  3. Do they need a 30 day or 90 day supply? 90 day

## 2018-02-17 ENCOUNTER — Other Ambulatory Visit: Payer: Self-pay

## 2018-02-17 MED ORDER — FUROSEMIDE 20 MG PO TABS: 20.0000 mg | ORAL_TABLET | Freq: Every day | ORAL | 0 refills | Status: DC

## 2018-02-17 NOTE — Telephone Encounter (Signed)
Refill has been sent.  °

## 2018-02-22 DIAGNOSIS — M25562 Pain in left knee: Secondary | ICD-10-CM | POA: Diagnosis not present

## 2018-03-18 NOTE — Telephone Encounter (Signed)
Refill already sent.

## 2018-03-22 DIAGNOSIS — M5432 Sciatica, left side: Secondary | ICD-10-CM | POA: Diagnosis not present

## 2018-03-22 DIAGNOSIS — M7122 Synovial cyst of popliteal space [Baker], left knee: Secondary | ICD-10-CM | POA: Diagnosis not present

## 2018-03-22 DIAGNOSIS — I10 Essential (primary) hypertension: Secondary | ICD-10-CM | POA: Diagnosis not present

## 2018-03-23 ENCOUNTER — Other Ambulatory Visit: Payer: Self-pay | Admitting: Family Medicine

## 2018-03-23 DIAGNOSIS — M5432 Sciatica, left side: Secondary | ICD-10-CM

## 2018-04-03 ENCOUNTER — Ambulatory Visit
Admission: RE | Admit: 2018-04-03 | Discharge: 2018-04-03 | Disposition: A | Discharge status: Home / Self Care | Payer: Medicare Other | Source: Ambulatory Visit | Attending: Family Medicine | Admitting: Family Medicine

## 2018-04-03 DIAGNOSIS — M5432 Sciatica, left side: Secondary | ICD-10-CM

## 2018-04-03 DIAGNOSIS — M48061 Spinal stenosis, lumbar region without neurogenic claudication: Secondary | ICD-10-CM | POA: Diagnosis not present

## 2018-04-28 DIAGNOSIS — M4716 Other spondylosis with myelopathy, lumbar region: Secondary | ICD-10-CM | POA: Diagnosis not present

## 2018-05-11 DIAGNOSIS — L578 Other skin changes due to chronic exposure to nonionizing radiation: Secondary | ICD-10-CM | POA: Diagnosis not present

## 2018-05-11 DIAGNOSIS — Z85828 Personal history of other malignant neoplasm of skin: Secondary | ICD-10-CM | POA: Diagnosis not present

## 2018-05-11 DIAGNOSIS — L570 Actinic keratosis: Secondary | ICD-10-CM | POA: Diagnosis not present

## 2018-05-16 DIAGNOSIS — M5432 Sciatica, left side: Secondary | ICD-10-CM | POA: Diagnosis not present

## 2018-05-16 DIAGNOSIS — R1310 Dysphagia, unspecified: Secondary | ICD-10-CM | POA: Diagnosis not present

## 2018-05-16 DIAGNOSIS — H6120 Impacted cerumen, unspecified ear: Secondary | ICD-10-CM | POA: Diagnosis not present

## 2018-05-16 DIAGNOSIS — N400 Enlarged prostate without lower urinary tract symptoms: Secondary | ICD-10-CM | POA: Diagnosis not present

## 2018-05-19 DIAGNOSIS — M25562 Pain in left knee: Secondary | ICD-10-CM | POA: Diagnosis not present

## 2018-05-19 DIAGNOSIS — M25561 Pain in right knee: Secondary | ICD-10-CM | POA: Diagnosis not present

## 2018-05-19 DIAGNOSIS — M170 Bilateral primary osteoarthritis of knee: Secondary | ICD-10-CM | POA: Diagnosis not present

## 2018-06-23 DIAGNOSIS — I119 Hypertensive heart disease without heart failure: Secondary | ICD-10-CM | POA: Diagnosis not present

## 2018-06-23 DIAGNOSIS — I872 Venous insufficiency (chronic) (peripheral): Secondary | ICD-10-CM | POA: Diagnosis not present

## 2018-06-23 DIAGNOSIS — M5416 Radiculopathy, lumbar region: Secondary | ICD-10-CM | POA: Diagnosis not present

## 2018-06-23 DIAGNOSIS — N400 Enlarged prostate without lower urinary tract symptoms: Secondary | ICD-10-CM | POA: Diagnosis not present

## 2018-10-18 DIAGNOSIS — Z85828 Personal history of other malignant neoplasm of skin: Secondary | ICD-10-CM | POA: Diagnosis not present

## 2018-10-18 DIAGNOSIS — L819 Disorder of pigmentation, unspecified: Secondary | ICD-10-CM | POA: Diagnosis not present

## 2018-10-18 DIAGNOSIS — D485 Neoplasm of uncertain behavior of skin: Secondary | ICD-10-CM | POA: Diagnosis not present

## 2018-10-19 DIAGNOSIS — C44321 Squamous cell carcinoma of skin of nose: Secondary | ICD-10-CM | POA: Diagnosis not present

## 2018-11-02 DIAGNOSIS — X32XXXD Exposure to sunlight, subsequent encounter: Secondary | ICD-10-CM | POA: Diagnosis not present

## 2018-11-02 DIAGNOSIS — L570 Actinic keratosis: Secondary | ICD-10-CM | POA: Diagnosis not present

## 2018-11-02 DIAGNOSIS — D0439 Carcinoma in situ of skin of other parts of face: Secondary | ICD-10-CM | POA: Diagnosis not present

## 2018-12-06 DIAGNOSIS — Z85828 Personal history of other malignant neoplasm of skin: Secondary | ICD-10-CM | POA: Diagnosis not present

## 2018-12-06 DIAGNOSIS — Z08 Encounter for follow-up examination after completed treatment for malignant neoplasm: Secondary | ICD-10-CM | POA: Diagnosis not present

## 2018-12-20 DIAGNOSIS — I119 Hypertensive heart disease without heart failure: Secondary | ICD-10-CM | POA: Diagnosis not present

## 2018-12-20 DIAGNOSIS — N400 Enlarged prostate without lower urinary tract symptoms: Secondary | ICD-10-CM | POA: Diagnosis not present

## 2018-12-20 DIAGNOSIS — M5416 Radiculopathy, lumbar region: Secondary | ICD-10-CM | POA: Diagnosis not present

## 2018-12-20 DIAGNOSIS — I872 Venous insufficiency (chronic) (peripheral): Secondary | ICD-10-CM | POA: Diagnosis not present

## 2018-12-26 DIAGNOSIS — E559 Vitamin D deficiency, unspecified: Secondary | ICD-10-CM | POA: Diagnosis not present

## 2018-12-26 DIAGNOSIS — I119 Hypertensive heart disease without heart failure: Secondary | ICD-10-CM | POA: Diagnosis not present

## 2019-04-05 DIAGNOSIS — Z0120 Encounter for dental examination and cleaning without abnormal findings: Secondary | ICD-10-CM | POA: Diagnosis not present

## 2019-04-07 DIAGNOSIS — N400 Enlarged prostate without lower urinary tract symptoms: Secondary | ICD-10-CM | POA: Diagnosis not present

## 2019-04-07 DIAGNOSIS — E782 Mixed hyperlipidemia: Secondary | ICD-10-CM | POA: Diagnosis not present

## 2019-04-07 DIAGNOSIS — I119 Hypertensive heart disease without heart failure: Secondary | ICD-10-CM | POA: Diagnosis not present

## 2019-04-07 DIAGNOSIS — I10 Essential (primary) hypertension: Secondary | ICD-10-CM | POA: Diagnosis not present

## 2019-06-06 DIAGNOSIS — Z08 Encounter for follow-up examination after completed treatment for malignant neoplasm: Secondary | ICD-10-CM | POA: Diagnosis not present

## 2019-06-06 DIAGNOSIS — D225 Melanocytic nevi of trunk: Secondary | ICD-10-CM | POA: Diagnosis not present

## 2019-06-06 DIAGNOSIS — Z85828 Personal history of other malignant neoplasm of skin: Secondary | ICD-10-CM | POA: Diagnosis not present

## 2019-06-06 DIAGNOSIS — L570 Actinic keratosis: Secondary | ICD-10-CM | POA: Diagnosis not present

## 2019-06-22 DIAGNOSIS — I119 Hypertensive heart disease without heart failure: Secondary | ICD-10-CM | POA: Diagnosis not present

## 2019-06-22 DIAGNOSIS — E782 Mixed hyperlipidemia: Secondary | ICD-10-CM | POA: Diagnosis not present

## 2019-06-22 DIAGNOSIS — I872 Venous insufficiency (chronic) (peripheral): Secondary | ICD-10-CM | POA: Diagnosis not present

## 2019-06-22 DIAGNOSIS — N400 Enlarged prostate without lower urinary tract symptoms: Secondary | ICD-10-CM | POA: Diagnosis not present

## 2019-07-25 DIAGNOSIS — N400 Enlarged prostate without lower urinary tract symptoms: Secondary | ICD-10-CM | POA: Diagnosis not present

## 2019-07-25 DIAGNOSIS — I10 Essential (primary) hypertension: Secondary | ICD-10-CM | POA: Diagnosis not present

## 2019-07-25 DIAGNOSIS — I119 Hypertensive heart disease without heart failure: Secondary | ICD-10-CM | POA: Diagnosis not present

## 2019-07-25 DIAGNOSIS — E782 Mixed hyperlipidemia: Secondary | ICD-10-CM | POA: Diagnosis not present

## 2019-09-12 ENCOUNTER — Ambulatory Visit: Payer: Medicare Other | Attending: Internal Medicine

## 2019-09-12 DIAGNOSIS — M5416 Radiculopathy, lumbar region: Secondary | ICD-10-CM | POA: Diagnosis not present

## 2019-09-12 DIAGNOSIS — Z23 Encounter for immunization: Secondary | ICD-10-CM

## 2019-09-12 DIAGNOSIS — K5900 Constipation, unspecified: Secondary | ICD-10-CM | POA: Diagnosis not present

## 2019-09-12 DIAGNOSIS — I10 Essential (primary) hypertension: Secondary | ICD-10-CM | POA: Diagnosis not present

## 2019-09-12 DIAGNOSIS — I872 Venous insufficiency (chronic) (peripheral): Secondary | ICD-10-CM | POA: Diagnosis not present

## 2019-09-12 NOTE — Progress Notes (Signed)
   Covid-19 Vaccination Clinic  Name:  Alexander Rich    MRN: [id number] DOB: [birth year]  09/12/2019  Mr. Alexander Rich was observed post Covid-19 immunization for 15 minutes without incident. He was provided with Vaccine Information Sheet and instruction to access the V-Safe system.   Mr. Alexander Rich was instructed to call 911 with any severe reactions post vaccine: Marland Kitchen Difficulty breathing  . Swelling of face and throat  . A fast heartbeat  . A bad rash all over body  . Dizziness and weakness   Immunizations Administered    Name Date Dose VIS Date Route   Pfizer COVID-19 Vaccine 09/12/2019  3:42 PM 0.3 mL 06/28/2018 Intramuscular   Manufacturer: [employer], [employer]   Lot: [id number]   NDC: [address]-8

## 2019-09-15 ENCOUNTER — Other Ambulatory Visit: Payer: Self-pay

## 2019-09-15 ENCOUNTER — Encounter (HOSPITAL_COMMUNITY): Payer: Self-pay | Admitting: Emergency Medicine

## 2019-09-15 ENCOUNTER — Emergency Department (HOSPITAL_COMMUNITY): Payer: Medicare Other

## 2019-09-15 ENCOUNTER — Emergency Department (HOSPITAL_COMMUNITY)
Admission: EM | Admit: 2019-09-15 | Discharge: 2019-09-15 | Disposition: A | Discharge status: Home / Self Care | Payer: Medicare Other | Attending: Emergency Medicine | Admitting: Emergency Medicine

## 2019-09-15 DIAGNOSIS — K5900 Constipation, unspecified: Secondary | ICD-10-CM | POA: Diagnosis not present

## 2019-09-15 DIAGNOSIS — Z79899 Other long term (current) drug therapy: Secondary | ICD-10-CM | POA: Insufficient documentation

## 2019-09-15 DIAGNOSIS — K5730 Diverticulosis of large intestine without perforation or abscess without bleeding: Secondary | ICD-10-CM | POA: Diagnosis not present

## 2019-09-15 LAB — CBC WITH DIFFERENTIAL/PLATELET
Abs Immature Granulocytes: 0.02 10*3/uL (ref 0.00–0.07)
Basophils Absolute: 0 10*3/uL (ref 0.0–0.1)
Basophils Relative: 1 %
Eosinophils Absolute: 0.1 10*3/uL (ref 0.0–0.5)
Eosinophils Relative: 1 %
HCT: 44 % (ref 39.0–52.0)
Hemoglobin: 14.3 g/dL (ref 13.0–17.0)
Immature Granulocytes: 0 %
Lymphocytes Relative: 30 %
Lymphs Abs: 2.1 10*3/uL (ref 0.7–4.0)
MCH: 33.5 pg (ref 26.0–34.0)
MCHC: 32.5 g/dL (ref 30.0–36.0)
MCV: 103 fL — ABNORMAL HIGH (ref 80.0–100.0)
Monocytes Absolute: 0.8 10*3/uL (ref 0.1–1.0)
Monocytes Relative: 10 %
Neutro Abs: 4.2 10*3/uL (ref 1.7–7.7)
Neutrophils Relative %: 58 %
Platelets: 133 10*3/uL — ABNORMAL LOW (ref 150–400)
RBC: 4.27 MIL/uL (ref 4.22–5.81)
RDW: 12.8 % (ref 11.5–15.5)
WBC: 7.2 10*3/uL (ref 4.0–10.5)
nRBC: 0 % (ref 0.0–0.2)

## 2019-09-15 LAB — COMPREHENSIVE METABOLIC PANEL
ALT: 23 U/L (ref 0–44)
AST: 31 U/L (ref 15–41)
Albumin: 3.7 g/dL (ref 3.5–5.0)
Alkaline Phosphatase: 54 U/L (ref 38–126)
Anion gap: 6 (ref 5–15)
BUN: 9 mg/dL (ref 8–23)
CO2: 28 mmol/L (ref 22–32)
Calcium: 8.7 mg/dL — ABNORMAL LOW (ref 8.9–10.3)
Chloride: 101 mmol/L (ref 98–111)
Creatinine, Ser: 1.08 mg/dL (ref 0.61–1.24)
GFR calc Af Amer: >60 mL/min (ref >60)
GFR calc non Af Amer: 60 mL/min — ABNORMAL LOW (ref >60)
Glucose, Bld: 97 mg/dL (ref 70–99)
Potassium: 4.1 mmol/L (ref 3.5–5.1)
Sodium: 135 mmol/L (ref 135–145)
Total Bilirubin: 1.5 mg/dL — ABNORMAL HIGH (ref 0.3–1.2)
Total Protein: 6.9 g/dL (ref 6.5–8.1)

## 2019-09-15 MED ORDER — IOHEXOL 300 MG/ML  SOLN
100.0000 mL | Freq: Once | INTRAMUSCULAR | Status: AC | PRN
  Administered 2019-09-15: 100 mL via INTRAVENOUS

## 2019-09-15 NOTE — Discharge Instructions (Addendum)
Per our discussion, I would recommend that you start taking your MiraLAX 2-3 times daily for the next few days.  Start with a dose tonight when you eat dinner.  Make sure you are adequately hydrating.  Tomorrow you can increase the dose to 2-3 times per day.  If you find that you have not had a bowel movement by Monday morning please follow-up with your primary care provider to discuss your symptoms.  You can always return to the emergency department if you have new or worsening symptoms.  It was a pleasure to meet you.

## 2019-09-15 NOTE — ED Notes (Signed)
ED Provider at bedside. 

## 2019-09-15 NOTE — ED Provider Notes (Signed)
Medical screening examination/treatment/procedure(s) were conducted as a shared visit with non-physician practitioner(s) and myself.  I personally evaluated the patient during the encounter.      Patient seen by me along with the physician assistant.  Patient is concerned about constipation.  Has not had a normal bowel movement for about a week.  But no vomiting.  He has tried some prune juice at home which resulted in a little bit of a liquid bowel movement.  Physician assistant seeing him is already done a rectal exam.  There was no evidence of any significant impaction.  So patient is gone to get CT scan of abdomen to determine if there is anything significant going inside the abdomen like an obstruction.  Once CT results are known then again plan for treatment can be worked out.  If it does show constipation would recommend MiraLAX.  Follow back up with primary care provider.  Patient's abdomen is soft and nontender no significant point tenderness anywhere.  No guarding no mass.  Patient nontoxic.  Not febrile not tachycardic little bit hypertensive oxygen sats are in the upper 90s on room air.   Vanetta Mulders, MD 09/15/19 [phone]

## 2019-09-15 NOTE — ED Provider Notes (Signed)
[facility]-EMERGENCY DEPT Provider Note   CSN: [id number] Arrival date & time: 09/15/19  1318     History Chief Complaint  Patient presents with  . Constipation    Alexander Rich is a 86 y.o. male.  HPI HPI Comments: Alexander Rich is a 86 y.o. male who presents to the Emergency Department complaining of constipation.  Patient reports a history of constipation but never of this severity.  He states he has not had a regular bowel movement in 6 days.  He saw his primary care provider 3 days ago was placed on 20 mg of lactulose daily in addition to once daily MiraLAX.  He denies relief.  He states he is passing gas as well as a small amount of diarrhea but denies any other appreciable bowel movement.  He reports associated abdominal distention with mild diffuse pain.  He denies a surgical history to his abdomen.  He denies nausea, vomiting, fevers, chills, chest pain, shortness of breath, urinary changes.      History reviewed. No pertinent past medical history.  There are no problems to display for this patient.   History reviewed. No pertinent surgical history.     No family history on file.  Social History   Tobacco Use  . Smoking status: Not on file  Substance Use Topics  . Alcohol use: Not on file  . Drug use: Not on file    Home Medications Prior to Admission medications   Medication Sig Start Date End Date Taking? Authorizing Provider  amLODipine (NORVASC) 2.5 MG tablet Take 2.5 mg by mouth daily. 09/12/19  Yes [provider]  Ascorbic Acid (VITAMIN C) 1000 MG tablet Take 1,000 mg by mouth daily.   Yes [provider]  furosemide (LASIX) 20 MG tablet Take 1 tablet (20 mg total) by mouth daily. 02/17/18 09/15/19 Yes Baldo Daub, MD  lactulose (CHRONULAC) 10 GM/15ML solution Take 15 mLs by mouth 2 (two) times daily as needed for moderate constipation. 09/12/19  Yes [provider]  losartan (COZAAR) 100 MG tablet  Take 100 mg by mouth daily. 09/12/19  Yes [provider]  oxyCODONE-acetaminophen (PERCOCET/ROXICET) 5-325 MG tablet Take 1-2 tablets by mouth every 6 (six) hours as needed for pain. 08/15/19  Yes [provider]  polyethylene glycol (MIRALAX / GLYCOLAX) 17 g packet Take 17 g by mouth daily as needed for mild constipation.   Yes [provider]  tamsulosin (FLOMAX) 0.4 MG CAPS capsule Take 0.4 mg by mouth daily. 09/12/19  Yes [provider]  zinc gluconate 50 MG tablet Take 50 mg by mouth daily.   Yes [provider]    Allergies    Patient has no known allergies.  Review of Systems   Review of Systems  All other systems reviewed and are negative. Ten systems reviewed and are negative for acute change, except as noted in the HPI.   Physical Exam Updated Vital Signs BP (!) 187/89 (BP Location: Right Arm)   Pulse 71   Temp 97.6 F (36.4 C) (Oral)   Resp 17   SpO2 97%   Physical Exam Vitals and nursing note reviewed.  Constitutional:      General: He is not in acute distress.    Appearance: Normal appearance. He is not ill-appearing, toxic-appearing or diaphoretic.  HENT:     Head: Normocephalic and atraumatic.     Right Ear: External ear normal.     Left Ear: External ear normal.  Nose: Nose normal.     Mouth/Throat:     Pharynx: Oropharynx is clear.  Eyes:     Extraocular Movements: Extraocular movements intact.  Cardiovascular:     Rate and Rhythm: Normal rate and regular rhythm.     Pulses: Normal pulses.     Heart sounds: Normal heart sounds. No murmur. No friction rub. No gallop.   Pulmonary:     Effort: Pulmonary effort is normal. No respiratory distress.     Breath sounds: Normal breath sounds. No stridor. No wheezing, rhonchi or rales.  Abdominal:     General: Abdomen is flat. There is distension.     Palpations: Abdomen is soft.     Tenderness: There is no abdominal tenderness.     Comments: Mildly distended abdomen  that is soft and nontender in all 4 abdominal quadrants.  Hypoactive bowel sounds noted diffusely.  Genitourinary:    Comments: Nursing chaperone present.  Normal-appearing anal sphincter.  No signs of external hemorrhoids.  No rectal pain appreciated.  No internal hemorrhoids palpated.  No gross rectal bleeding.  Unable to palpate hard stool in the rectum. Musculoskeletal:        General: Normal range of motion.     Cervical back: Normal range of motion and neck supple. No tenderness.  Skin:    General: Skin is warm and dry.  Neurological:     General: No focal deficit present.     Mental Status: He is alert and oriented to person, place, and time.  Psychiatric:        Mood and Affect: Mood normal.        Behavior: Behavior normal.    ED Results / Procedures / Treatments   Labs (all labs ordered are listed, but only abnormal results are displayed) Labs Reviewed - No data to display  EKG None  Radiology CT ABDOMEN PELVIS W CONTRAST  Result Date: 09/15/2019 CLINICAL DATA:  Constipation.  Abdominal distention EXAM: CT ABDOMEN AND PELVIS WITH CONTRAST TECHNIQUE: Multidetector CT imaging of the abdomen and pelvis was performed using the standard protocol following bolus administration of intravenous contrast. CONTRAST:  OMNIPAQUE IOHEXOL 300 MG/ML  SOLN COMPARISON:  None. FINDINGS: Lower chest: Small to moderate-sized hiatal hernia. Bronchiectasis and scarring in the right lung base. No acute abnormality. Hepatobiliary: Numerous hepatic cysts are stable. Small gallstone layering within the gallbladder, stable. No biliary ductal dilatation. Pancreas: No focal abnormality or ductal dilatation. Spleen: No focal abnormality.  Normal size. Adrenals/Urinary Tract: No adrenal abnormality. No focal renal abnormality. No stones or hydronephrosis. Urinary bladder is unremarkable. Stomach/Bowel: No evidence of bowel obstruction. Stomach and small bowel decompressed. Left colonic diverticulosis. No  active diverticulitis. Vascular/Lymphatic: Aortic atherosclerosis. No enlarged abdominal or pelvic lymph nodes. Reproductive: Marked prostate enlargement. Other: No free fluid or free air. Bilateral inguinal hernias containing fat. Musculoskeletal: No acute bony abnormality. IMPRESSION: No acute findings.  No evidence of bowel obstruction. Left colonic diverticulosis.  No active diverticulitis. Aortic atherosclerosis. Prostate enlargement. Small to moderate-sized hiatal hernia. Electronically Signed   By: Charlett Nose M.D.   On: 09/15/2019 18:38    Procedures Procedures   Medications Ordered in ED Medications  iohexol (OMNIPAQUE) 300 MG/ML solution 100 mL (100 mLs Intravenous Contrast Given 09/15/19 1740)    ED Course  I have reviewed the triage vital signs and the nursing notes.  Pertinent labs & imaging results that were available during my care of the patient were reviewed by me and considered in my medical decision  making (see chart for details).    MDM Rules/Calculators/A&P                      Patient is a 86 year old male that presents with acute on chronic constipation.  Patient states he has not had a bowel movement for 6 days.  Physical exam is generally reassuring.  I performed a rectal exam as well with no stool burden noted in the rectal vault.  Based on his age I obtain basic labs as well as a CT scan of his abdomen and pelvis.  CT scan showed no acute findings.  No evidence of small bowel obstruction.  Lab results are generally reassuring.  I discussed the imaging results as well as lab results with the patient.  He seemed relieved.  Upon further discussion he notes that he has never experienced any significant health crises and when he could not have a bowel movement 6 days ago decreased his p.o. intake because he was "nervous" of becoming obstructed.  He states for the last 6 days he has been eating very small meals or not eating at all due to this.  I recommended that he  start eating this evening and also begin taking MiraLAX starting tonight.  I recommended that he increase his dose to 2-3 times per day beginning tomorrow.  We discussed adequate hydration as well.  It is Friday, so I recommended that he follow-up with his primary care first thing on Monday morning if he still feels constipated.  He was given strict return precautions as well and knows he can return to the emergency department if he has any new or worsening symptoms.  His questions were answered and he was amicable the time of discharge.  His vital signs are stable.  Patient discharged to home/self care.  Condition at discharge: Stable  Note: Portions of this report may have been transcribed using voice recognition software. Every effort was made to ensure accuracy; however, inadvertent computerized transcription errors may be present.     Final Clinical Impression(s) / ED Diagnoses Final diagnoses:  Constipation, unspecified constipation type   Rx / DC Orders ED Discharge Orders    None       Rayna Sexton, PA-C 09/15/19 1947    Fredia Sorrow, MD 09/16/19 [phone]

## 2019-09-15 NOTE — ED Triage Notes (Signed)
Patient reports constipation x1 week. Last BM x6 days ago. Denies vomiting. States abdominal distension since yesterday.

## 2019-09-26 ENCOUNTER — Emergency Department (HOSPITAL_COMMUNITY)
Admission: EM | Admit: 2019-09-26 | Discharge: 2019-09-26 | Disposition: A | Discharge status: Home / Self Care | Payer: Medicare Other | Attending: Emergency Medicine | Admitting: Emergency Medicine

## 2019-09-26 ENCOUNTER — Emergency Department (HOSPITAL_COMMUNITY): Payer: Medicare Other

## 2019-09-26 ENCOUNTER — Encounter (HOSPITAL_COMMUNITY): Payer: Self-pay | Admitting: Emergency Medicine

## 2019-09-26 ENCOUNTER — Other Ambulatory Visit: Payer: Self-pay

## 2019-09-26 DIAGNOSIS — R1084 Generalized abdominal pain: Secondary | ICD-10-CM | POA: Diagnosis not present

## 2019-09-26 DIAGNOSIS — K8020 Calculus of gallbladder without cholecystitis without obstruction: Secondary | ICD-10-CM | POA: Diagnosis not present

## 2019-09-26 DIAGNOSIS — R140 Abdominal distension (gaseous): Secondary | ICD-10-CM | POA: Diagnosis present

## 2019-09-26 DIAGNOSIS — K5900 Constipation, unspecified: Secondary | ICD-10-CM | POA: Diagnosis not present

## 2019-09-26 DIAGNOSIS — Z79899 Other long term (current) drug therapy: Secondary | ICD-10-CM | POA: Insufficient documentation

## 2019-09-26 DIAGNOSIS — K5730 Diverticulosis of large intestine without perforation or abscess without bleeding: Secondary | ICD-10-CM | POA: Diagnosis not present

## 2019-09-26 LAB — LIPASE, BLOOD: Lipase: 29 U/L (ref 11–51)

## 2019-09-26 LAB — CBC WITH DIFFERENTIAL/PLATELET
Abs Immature Granulocytes: 0.02 10*3/uL (ref 0.00–0.07)
Basophils Absolute: 0 10*3/uL (ref 0.0–0.1)
Basophils Relative: 1 %
Eosinophils Absolute: 0.1 10*3/uL (ref 0.0–0.5)
Eosinophils Relative: 1 %
HCT: 45.5 % (ref 39.0–52.0)
Hemoglobin: 14.5 g/dL (ref 13.0–17.0)
Immature Granulocytes: 0 %
Lymphocytes Relative: 33 %
Lymphs Abs: 2.1 10*3/uL (ref 0.7–4.0)
MCH: 32.8 pg (ref 26.0–34.0)
MCHC: 31.9 g/dL (ref 30.0–36.0)
MCV: 102.9 fL — ABNORMAL HIGH (ref 80.0–100.0)
Monocytes Absolute: 0.7 10*3/uL (ref 0.1–1.0)
Monocytes Relative: 11 %
Neutro Abs: 3.5 10*3/uL (ref 1.7–7.7)
Neutrophils Relative %: 54 %
Platelets: 148 10*3/uL — ABNORMAL LOW (ref 150–400)
RBC: 4.42 MIL/uL (ref 4.22–5.81)
RDW: 12.9 % (ref 11.5–15.5)
WBC: 6.5 10*3/uL (ref 4.0–10.5)
nRBC: 0 % (ref 0.0–0.2)

## 2019-09-26 LAB — COMPREHENSIVE METABOLIC PANEL
ALT: 25 U/L (ref 0–44)
AST: 32 U/L (ref 15–41)
Albumin: 4.1 g/dL (ref 3.5–5.0)
Alkaline Phosphatase: 53 U/L (ref 38–126)
Anion gap: 9 (ref 5–15)
BUN: 13 mg/dL (ref 8–23)
CO2: 28 mmol/L (ref 22–32)
Calcium: 9.2 mg/dL (ref 8.9–10.3)
Chloride: 102 mmol/L (ref 98–111)
Creatinine, Ser: 1.13 mg/dL (ref 0.61–1.24)
GFR calc Af Amer: >60 mL/min (ref >60)
GFR calc non Af Amer: 57 mL/min — ABNORMAL LOW (ref >60)
Glucose, Bld: 106 mg/dL — ABNORMAL HIGH (ref 70–99)
Potassium: 4.4 mmol/L (ref 3.5–5.1)
Sodium: 139 mmol/L (ref 135–145)
Total Bilirubin: 1.3 mg/dL — ABNORMAL HIGH (ref 0.3–1.2)
Total Protein: 7.2 g/dL (ref 6.5–8.1)

## 2019-09-26 MED ORDER — IOHEXOL 9 MG/ML PO SOLN
500.0000 mL | ORAL | Status: AC
  Administered 2019-09-26: 500 mL via ORAL

## 2019-09-26 MED ORDER — FENTANYL CITRATE (PF) 100 MCG/2ML IJ SOLN: 50.0000 ug | Freq: Once | INTRAMUSCULAR | Status: DC

## 2019-09-26 MED ORDER — SENNOSIDES-DOCUSATE SODIUM 8.6-50 MG PO TABS: 2.0000 | ORAL_TABLET | Freq: Every day | ORAL | 0 refills | Status: AC

## 2019-09-26 MED ORDER — ONDANSETRON HCL 4 MG/2ML IJ SOLN: 4.0000 mg | Freq: Once | INTRAMUSCULAR | Status: DC

## 2019-09-26 MED ORDER — SODIUM CHLORIDE 0.9% FLUSH: 3.0000 mL | Freq: Once | INTRAVENOUS | Status: DC

## 2019-09-26 MED ORDER — IOHEXOL 9 MG/ML PO SOLN
ORAL | Status: AC
  Filled 2019-09-26: qty 1000

## 2019-09-26 MED ORDER — SODIUM CHLORIDE 0.9 % IV SOLN: INTRAVENOUS | Status: DC

## 2019-09-26 NOTE — Discharge Instructions (Signed)
As discussed, your evaluation today has been largely reassuring.  But, it is important that you monitor your condition carefully, and do not hesitate to return to the ED if you develop new, or concerning changes in your condition.  Otherwise, please follow-up with your physician and the gastroenterology team for appropriate ongoing care.

## 2019-09-26 NOTE — ED Notes (Signed)
Patient transported to CT 

## 2019-09-26 NOTE — ED Provider Notes (Signed)
[facility]-EMERGENCY DEPT Provider Note   CSN: [id number] Arrival date & time: 09/26/19  0753     History Chief Complaint  Patient presents with  . Constipation    Alexander Rich is a 86 y.o. male.  HPI    Patient presents with concern of ongoing inconsistent bowel movements. Patient has intermittent abdominal distention, swelling, and substantial change in bowel movement pattern. He was otherwise well until about 2 weeks ago, when this began. There is no clear precipitant. Initially patient notes diminished stool production, spoke with his physician and was seen here.  He is since started taking MiraLAX, and milk of magnesia.  He now notes that he has small amounts of liquidy stool about every other day, not necessarily associated with the timing of his MiraLAX and milk of magnesia intake. Currently no pain, vomiting.  No fever, no chills.  He was seen and evaluated here.,  Had a CT scan, which did not demonstrate obstruction.  Rectal exam at that point also did not have resulted report of palpable stool. History reviewed. No pertinent past medical history.  There are no problems to display for this patient.   History reviewed. No pertinent surgical history.     No family history on file.  Social History   Tobacco Use  . Smoking status: Never Smoker  . Smokeless tobacco: Never Used  Substance Use Topics  . Alcohol use: Never  . Drug use: Never    Home Medications Prior to Admission medications   Medication Sig Start Date End Date Taking? Authorizing Provider  amLODipine (NORVASC) 2.5 MG tablet Take 2.5 mg by mouth daily. 09/12/19  Yes [provider]  Ascorbic Acid (VITAMIN C) 1000 MG tablet Take 1,000 mg by mouth daily.   Yes [provider]  furosemide (LASIX) 20 MG tablet Take 1 tablet (20 mg total) by mouth daily. 02/17/18 09/26/19 Yes Baldo Daub, MD  lactulose (CHRONULAC) 10 GM/15ML solution Take 15 mLs by mouth 2  (two) times daily as needed for moderate constipation. 09/12/19  Yes [provider]  losartan (COZAAR) 100 MG tablet Take 100 mg by mouth daily. 09/12/19  Yes [provider]  magnesium hydroxide (MILK OF MAGNESIA) 400 MG/5ML suspension Take 30 mLs by mouth daily as needed for moderate constipation.   Yes [provider]  oxyCODONE-acetaminophen (PERCOCET/ROXICET) 5-325 MG tablet Take 1-2 tablets by mouth every 6 (six) hours as needed for pain. 08/15/19  Yes [provider]  polyethylene glycol (MIRALAX / GLYCOLAX) 17 g packet Take 17 g by mouth daily as needed for mild constipation.   Yes [provider]  tamsulosin (FLOMAX) 0.4 MG CAPS capsule Take 0.4 mg by mouth daily. 09/12/19  Yes [provider]  zinc gluconate 50 MG tablet Take 50 mg by mouth daily.   Yes [provider]    Allergies    Patient has no known allergies.  Review of Systems   Review of Systems  Constitutional:       Per HPI, otherwise negative  HENT:       Per HPI, otherwise negative  Respiratory:       Per HPI, otherwise negative  Cardiovascular:       Per HPI, otherwise negative  Gastrointestinal: Positive for diarrhea. Negative for vomiting.  Endocrine:       Negative aside from HPI  Genitourinary:       Neg aside from HPI   Musculoskeletal:       Per  HPI, otherwise negative  Skin: Negative.   Neurological: Negative for syncope.    Physical Exam Updated Vital Signs BP (!) 161/85   Pulse 60   Temp 98.8 F (37.1 C) (Oral)   Resp 17   SpO2 97%   Physical Exam Vitals and nursing note reviewed.  Constitutional:      General: He is not in acute distress.    Appearance: He is well-developed.  HENT:     Head: Normocephalic and atraumatic.  Eyes:     Conjunctiva/sclera: Conjunctivae normal.  Cardiovascular:     Rate and Rhythm: Normal rate and regular rhythm.  Pulmonary:     Effort: Pulmonary effort is normal. No respiratory distress.      Breath sounds: No stridor.  Abdominal:     General: There is no distension.     Tenderness: There is no abdominal tenderness. There is no guarding.  Skin:    General: Skin is warm and dry.  Neurological:     Mental Status: He is alert and oriented to person, place, and time.     ED Results / Procedures / Treatments   Labs (all labs ordered are listed, but only abnormal results are displayed) Labs Reviewed  COMPREHENSIVE METABOLIC PANEL - Abnormal; Notable for the following components:      Result Value   Glucose, Bld 106 (*)    Total Bilirubin 1.3 (*)    GFR calc non Af Amer 57 (*)    All other components within normal limits  CBC WITH DIFFERENTIAL/PLATELET - Abnormal; Notable for the following components:   MCV 102.9 (*)    Platelets 148 (*)    All other components within normal limits  LIPASE, BLOOD    EKG None  Radiology CT Abdomen Pelvis Wo Contrast  Result Date: 09/26/2019 CLINICAL DATA:  Constipation for 2 weeks, question bowel obstruction EXAM: CT ABDOMEN AND PELVIS WITHOUT CONTRAST TECHNIQUE: Multidetector CT imaging of the abdomen and pelvis was performed following the standard protocol without IV contrast. Sagittal and coronal MPR images reconstructed from axial data set. Patient drank dilute oral contrast for exam COMPARISON:  09/15/2019 FINDINGS: Lower chest: Peribronchial thickening and bronchiectasis in RIGHT lower lobe. Interstitial prominence at BILATERAL lung bases. Hepatobiliary: Multiple hepatic cysts largest 7.6 x 5.7 cm image 17. Tiny dependent calculus within gallbladder. Calcified granuloma RIGHT lobe liver. No additional hepatic lesions. Pancreas: Partial fatty replacement. 11 x 9 mm nodular focus proximal pancreatic tail image 19, appears cystic by measurement of attenuation and unchanged from prior exam. No additional masses. Spleen: Normal appearance Adrenals/Urinary Tract: Adrenal glands normal appearance. Question tiny RIGHT renal cyst 10 x 9 mm diameter  image 34. Kidneys and ureters normal appearance. Minimal bladder wall thickening. Stomach/Bowel: Small to moderate-sized hiatal hernia. Remainder of stomach unremarkable. Appendix not definitely visualized but no pericecal inflammatory process seen. Diverticulosis of distal descending and sigmoid colon without evidence of diverticulitis. Remaining bowel loops unremarkable. Vascular/Lymphatic: Atherosclerotic calcifications of aorta and iliac arteries without aneurysm. Additional calcified plaque at origin of celiac artery and SMA. No adenopathy. Reproductive: Prostatic enlargement, gland measuring 7.0 x 6.0 x 7.5 cm (volume = 160 cm^3). Other: Tiny umbilical hernia containing fat. BILATERAL inguinal hernias containing fat. No free air or free fluid. Musculoskeletal: Osseous demineralization with degenerative disc and facet disease changes lumbar spine. IMPRESSION: Distal colonic diverticulosis without evidence of diverticulitis. Small to moderate-sized hiatal hernia. Hepatic cysts. Stable tiny cystic lesion within proximal pancreatic tail 11 x 9 mm. Cholelithiasis. Prostatic enlargement. Hiatal hernia. BILATERAL  inguinal hernias and tiny umbilical hernia containing fat. Chronic bronchitic changes and bronchiectasis in RIGHT lower lobe. Aortic Atherosclerosis (ICD10-I70.0). Electronically Signed   By: Lavonia Dana M.D.   On: 09/26/2019 11:53    Procedures Procedures (including critical care time)  Medications Ordered in ED Medications  sodium chloride flush (NS) 0.9 % injection 3 mL (0 mLs Intravenous Hold 09/26/19 0929)  0.9 %  sodium chloride infusion ( Intravenous New Bag/Given 09/26/19 0929)  iohexol (OMNIPAQUE) 9 MG/ML oral solution 500 mL (500 mLs Oral Contrast Given 09/26/19 0934)  iohexol (OMNIPAQUE) 9 MG/ML oral solution (has no administration in time range)    ED Course  I have reviewed the triage vital signs and the nursing notes.  Pertinent labs & imaging results that were available during my  care of the patient were reviewed by me and considered in my medical decision making (see chart for details).   12:43 PM Patient awake, alert, in no distress.  He notes that he feels substantially better. He has received meds here, and recently had a substantial bowel movement, again urine production.  We lengthy conversation about patient CT scan which I reviewed, labs, also reviewed.  No evidence for obstruction, mass, diverticulitis, infection.  Given his substantial improvement, passage of stool, no medication for likely occult partial obstruction either. Patient has been speak with his primary care physician, has had a referral to our GI physicians made on their behalf, and he was encouraged to keep that appointment. Given his improvement, the patient is appropriate for discharge, though with a slight change in his stool softener regimen. Final Clinical Impression(s) / ED Diagnoses Final diagnoses:  Generalized abdominal pain    Rx / DC Orders ED Discharge Orders         Ordered    senna-docusate (SENOKOT-S) 8.6-50 MG tablet  Daily     09/26/19 1245           Carmin Muskrat, MD 09/26/19 1245

## 2019-09-26 NOTE — ED Notes (Signed)
Patient ambulatory to restroom at this time. 

## 2019-09-26 NOTE — ED Triage Notes (Signed)
Patient here from home with complaints of constipation x2 weeks. Hx of same. MOM with no relief.

## 2019-09-27 DIAGNOSIS — I119 Hypertensive heart disease without heart failure: Secondary | ICD-10-CM | POA: Diagnosis not present

## 2019-09-27 DIAGNOSIS — E782 Mixed hyperlipidemia: Secondary | ICD-10-CM | POA: Diagnosis not present

## 2019-09-27 DIAGNOSIS — N400 Enlarged prostate without lower urinary tract symptoms: Secondary | ICD-10-CM | POA: Diagnosis not present

## 2019-09-27 DIAGNOSIS — I10 Essential (primary) hypertension: Secondary | ICD-10-CM | POA: Diagnosis not present

## 2019-09-29 DIAGNOSIS — K5904 Chronic idiopathic constipation: Secondary | ICD-10-CM | POA: Diagnosis not present

## 2019-09-29 DIAGNOSIS — K449 Diaphragmatic hernia without obstruction or gangrene: Secondary | ICD-10-CM | POA: Diagnosis not present

## 2019-09-29 DIAGNOSIS — K5730 Diverticulosis of large intestine without perforation or abscess without bleeding: Secondary | ICD-10-CM | POA: Diagnosis not present

## 2019-10-03 ENCOUNTER — Ambulatory Visit: Payer: Medicare Other | Attending: Internal Medicine

## 2019-10-03 DIAGNOSIS — Z23 Encounter for immunization: Secondary | ICD-10-CM

## 2019-10-03 NOTE — Progress Notes (Signed)
   Covid-19 Vaccination Clinic  Name:  Alexander Rich    MRN: [id number] DOB: 02/18/1926  10/03/2019  [name] was observed post Covid-19 immunization for 15 minutes without incident. He was provided with Vaccine Information Sheet and instruction to access the V-Safe system.   [name] was instructed to call 911 with any severe reactions post vaccine: Marland Kitchen Difficulty breathing  . Swelling of face and throat  . A fast heartbeat  . A bad rash all over body  . Dizziness and weakness   Immunizations Administered    Name Date Dose VIS Date Route   Pfizer COVID-19 Vaccine 10/03/2019 10:34 AM 0.3 mL 06/28/2018 Intramuscular   Manufacturer: [employer], [employer]   Lot: [id number]   NDC: [address]-2

## 2019-10-10 DIAGNOSIS — N400 Enlarged prostate without lower urinary tract symptoms: Secondary | ICD-10-CM | POA: Diagnosis not present

## 2019-10-10 DIAGNOSIS — F339 Major depressive disorder, recurrent, unspecified: Secondary | ICD-10-CM | POA: Diagnosis not present

## 2019-10-10 DIAGNOSIS — I10 Essential (primary) hypertension: Secondary | ICD-10-CM | POA: Diagnosis not present

## 2019-10-10 DIAGNOSIS — E782 Mixed hyperlipidemia: Secondary | ICD-10-CM | POA: Diagnosis not present

## 2019-10-12 DIAGNOSIS — Z1159 Encounter for screening for other viral diseases: Secondary | ICD-10-CM | POA: Diagnosis not present

## 2019-10-12 DIAGNOSIS — R194 Change in bowel habit: Secondary | ICD-10-CM | POA: Diagnosis not present

## 2019-10-12 DIAGNOSIS — K5904 Chronic idiopathic constipation: Secondary | ICD-10-CM | POA: Diagnosis not present

## 2019-11-07 DIAGNOSIS — Z1159 Encounter for screening for other viral diseases: Secondary | ICD-10-CM | POA: Diagnosis not present

## 2019-11-10 DIAGNOSIS — K5730 Diverticulosis of large intestine without perforation or abscess without bleeding: Secondary | ICD-10-CM | POA: Diagnosis not present

## 2019-11-10 DIAGNOSIS — K648 Other hemorrhoids: Secondary | ICD-10-CM | POA: Diagnosis not present

## 2019-11-10 DIAGNOSIS — D122 Benign neoplasm of ascending colon: Secondary | ICD-10-CM | POA: Diagnosis not present

## 2019-11-10 DIAGNOSIS — K5900 Constipation, unspecified: Secondary | ICD-10-CM | POA: Diagnosis not present

## 2019-11-10 DIAGNOSIS — D123 Benign neoplasm of transverse colon: Secondary | ICD-10-CM | POA: Diagnosis not present

## 2019-11-10 DIAGNOSIS — R194 Change in bowel habit: Secondary | ICD-10-CM | POA: Diagnosis not present

## 2019-11-15 DIAGNOSIS — D122 Benign neoplasm of ascending colon: Secondary | ICD-10-CM | POA: Diagnosis not present

## 2019-11-15 DIAGNOSIS — D123 Benign neoplasm of transverse colon: Secondary | ICD-10-CM | POA: Diagnosis not present

## 2019-12-20 DIAGNOSIS — Z0120 Encounter for dental examination and cleaning without abnormal findings: Secondary | ICD-10-CM | POA: Diagnosis not present

## 2019-12-26 DIAGNOSIS — I119 Hypertensive heart disease without heart failure: Secondary | ICD-10-CM | POA: Diagnosis not present

## 2019-12-26 DIAGNOSIS — Z0000 Encounter for general adult medical examination without abnormal findings: Secondary | ICD-10-CM | POA: Diagnosis not present

## 2019-12-26 DIAGNOSIS — F330 Major depressive disorder, recurrent, mild: Secondary | ICD-10-CM | POA: Diagnosis not present

## 2019-12-26 DIAGNOSIS — E782 Mixed hyperlipidemia: Secondary | ICD-10-CM | POA: Diagnosis not present

## 2020-02-22 DIAGNOSIS — F339 Major depressive disorder, recurrent, unspecified: Secondary | ICD-10-CM | POA: Diagnosis not present

## 2020-02-22 DIAGNOSIS — N400 Enlarged prostate without lower urinary tract symptoms: Secondary | ICD-10-CM | POA: Diagnosis not present

## 2020-02-22 DIAGNOSIS — I10 Essential (primary) hypertension: Secondary | ICD-10-CM | POA: Diagnosis not present

## 2020-02-22 DIAGNOSIS — E782 Mixed hyperlipidemia: Secondary | ICD-10-CM | POA: Diagnosis not present

## 2020-05-08 DIAGNOSIS — X32XXXD Exposure to sunlight, subsequent encounter: Secondary | ICD-10-CM | POA: Diagnosis not present

## 2020-05-08 DIAGNOSIS — L218 Other seborrheic dermatitis: Secondary | ICD-10-CM | POA: Diagnosis not present

## 2020-05-08 DIAGNOSIS — C44311 Basal cell carcinoma of skin of nose: Secondary | ICD-10-CM | POA: Diagnosis not present

## 2020-05-08 DIAGNOSIS — L570 Actinic keratosis: Secondary | ICD-10-CM | POA: Diagnosis not present

## 2020-05-16 DIAGNOSIS — Z01812 Encounter for preprocedural laboratory examination: Secondary | ICD-10-CM | POA: Diagnosis not present

## 2020-06-18 DIAGNOSIS — C44311 Basal cell carcinoma of skin of nose: Secondary | ICD-10-CM | POA: Diagnosis not present

## 2020-06-18 DIAGNOSIS — C44319 Basal cell carcinoma of skin of other parts of face: Secondary | ICD-10-CM | POA: Diagnosis not present

## 2020-06-27 DIAGNOSIS — N400 Enlarged prostate without lower urinary tract symptoms: Secondary | ICD-10-CM | POA: Diagnosis not present

## 2020-06-27 DIAGNOSIS — E782 Mixed hyperlipidemia: Secondary | ICD-10-CM | POA: Diagnosis not present

## 2020-06-27 DIAGNOSIS — I119 Hypertensive heart disease without heart failure: Secondary | ICD-10-CM | POA: Diagnosis not present

## 2020-06-27 DIAGNOSIS — I872 Venous insufficiency (chronic) (peripheral): Secondary | ICD-10-CM | POA: Diagnosis not present

## 2020-07-16 DIAGNOSIS — I119 Hypertensive heart disease without heart failure: Secondary | ICD-10-CM | POA: Diagnosis not present

## 2020-07-23 DIAGNOSIS — H47091 Other disorders of optic nerve, not elsewhere classified, right eye: Secondary | ICD-10-CM | POA: Diagnosis not present

## 2020-07-23 DIAGNOSIS — H40011 Open angle with borderline findings, low risk, right eye: Secondary | ICD-10-CM | POA: Diagnosis not present

## 2020-07-23 DIAGNOSIS — X32XXXD Exposure to sunlight, subsequent encounter: Secondary | ICD-10-CM | POA: Diagnosis not present

## 2020-07-23 DIAGNOSIS — L570 Actinic keratosis: Secondary | ICD-10-CM | POA: Diagnosis not present

## 2020-07-23 DIAGNOSIS — H1045 Other chronic allergic conjunctivitis: Secondary | ICD-10-CM | POA: Diagnosis not present

## 2020-07-23 DIAGNOSIS — D3132 Benign neoplasm of left choroid: Secondary | ICD-10-CM | POA: Diagnosis not present

## 2020-07-23 DIAGNOSIS — Z85828 Personal history of other malignant neoplasm of skin: Secondary | ICD-10-CM | POA: Diagnosis not present

## 2020-07-23 DIAGNOSIS — Z08 Encounter for follow-up examination after completed treatment for malignant neoplasm: Secondary | ICD-10-CM | POA: Diagnosis not present

## 2020-08-02 IMAGING — CT CT ABD-PELV W/O CM
2 of 4 series · 15 of 46 positions shown, 17 images · non-contrast
Comparison: 09/15/2019

CLINICAL DATA: Constipation for 2 weeks, question bowel obstruction

EXAM:
CT ABDOMEN AND PELVIS WITHOUT CONTRAST
TECHNIQUE: Multidetector CT imaging of the abdomen and pelvis was performed
following the standard protocol without IV contrast. Sagittal and
coronal MPR images reconstructed from axial data set. Patient drank
dilute oral contrast for exam

[Series 2: axial st · axial · 0.98mm/px · z∈[-486,-96]mm · 12 of 88 slices shown, 14 images]
[im 5/88  soft-tissue]
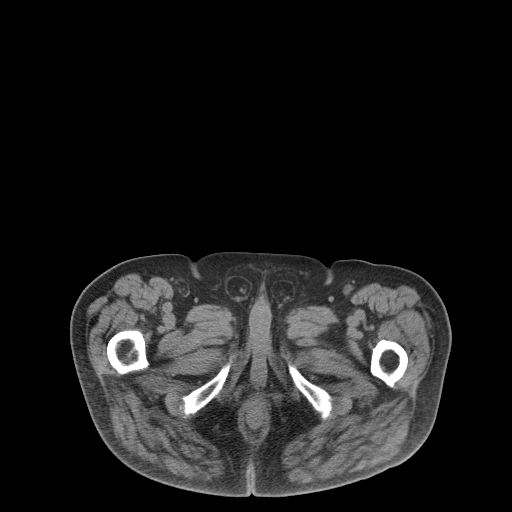
[im 5/88  bone]
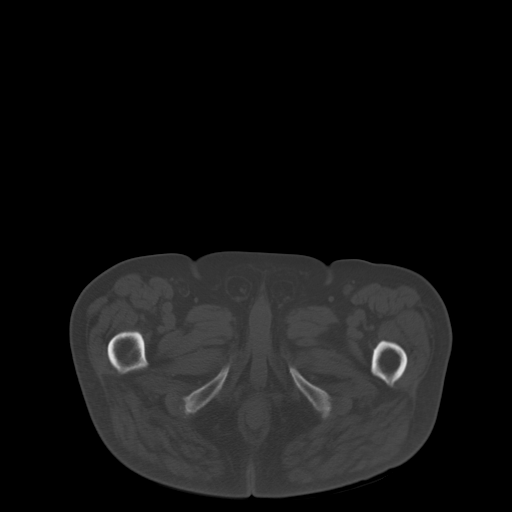
[im 14/88  soft-tissue]
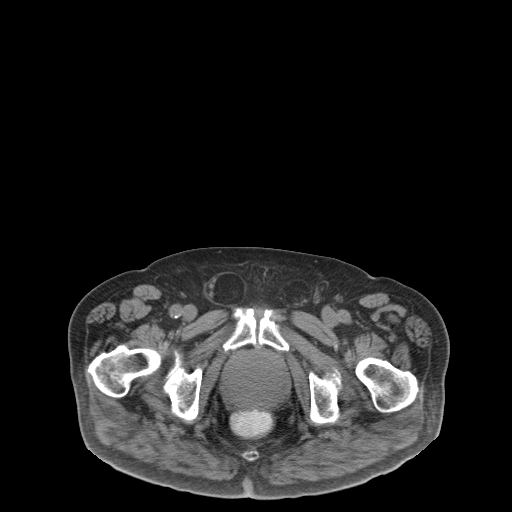
[im 19/88  soft-tissue]
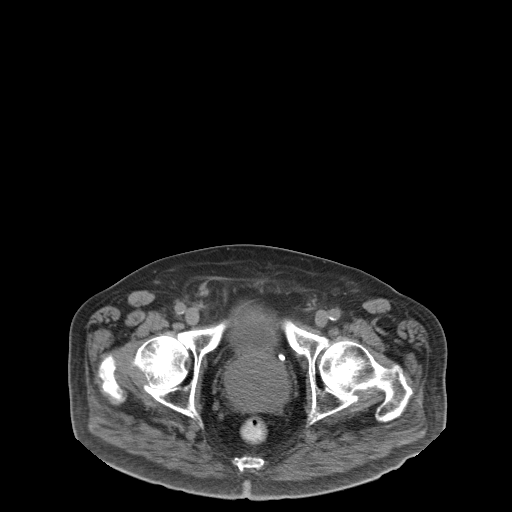
[im 28/88  soft-tissue]
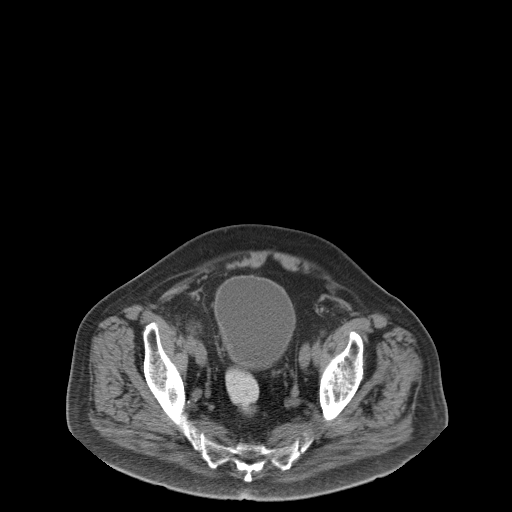
[im 33/88  soft-tissue]
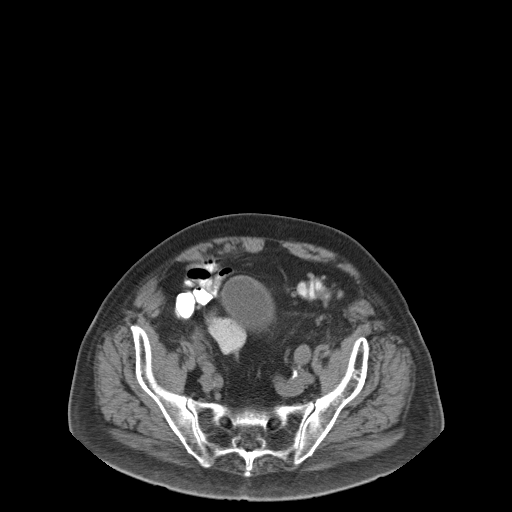
[im 42/88  soft-tissue]
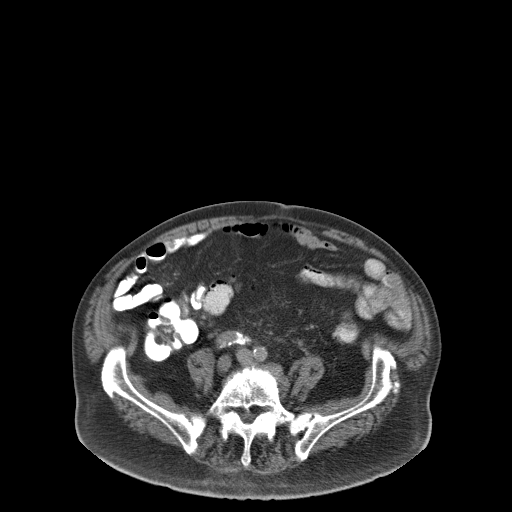
[im 46/88  soft-tissue]
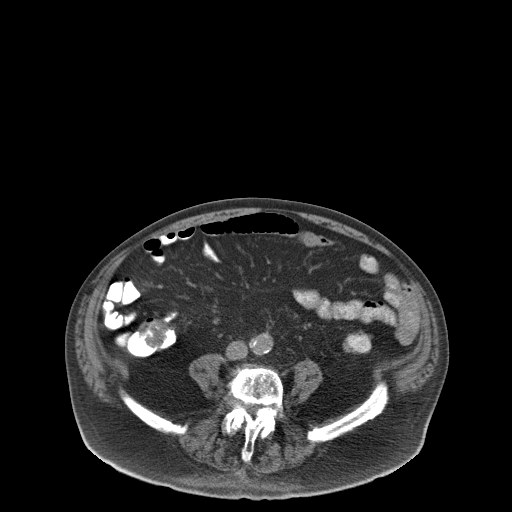
[im 55/88  soft-tissue]
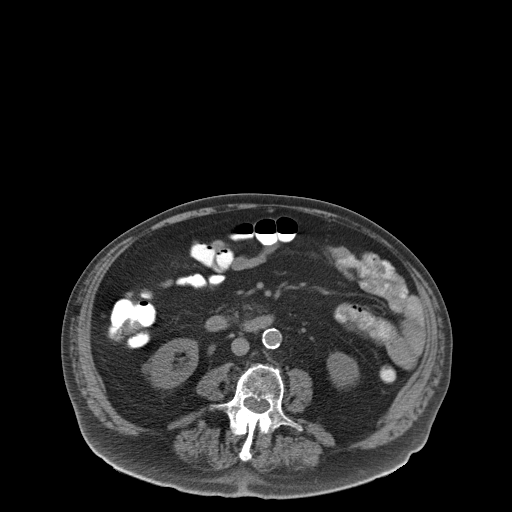
[im 60/88  soft-tissue]
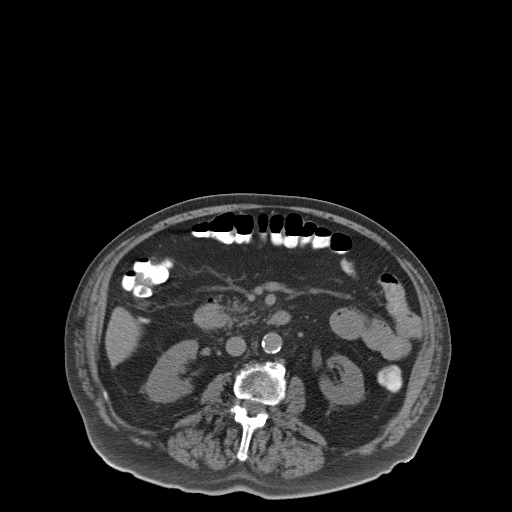
[im 60/88  bone]
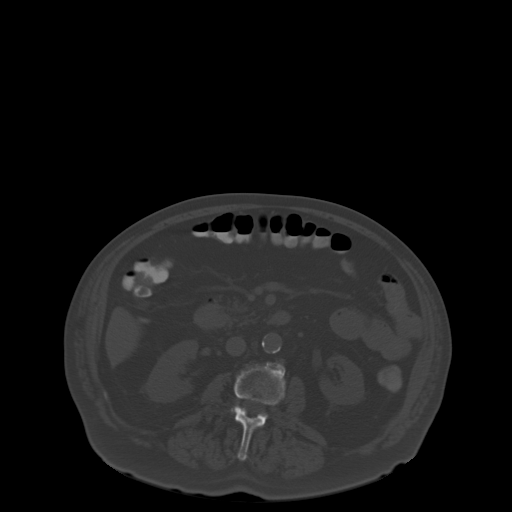
[im 69/88  soft-tissue]
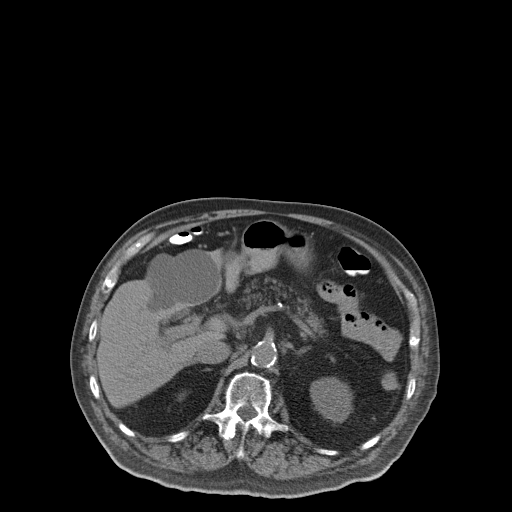
[im 74/88  soft-tissue]
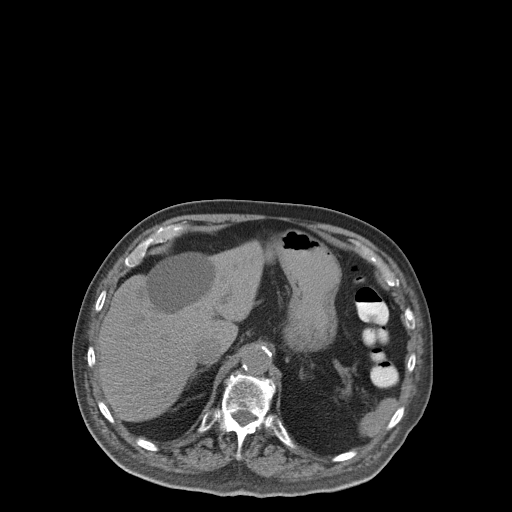
[im 83/88  soft-tissue]
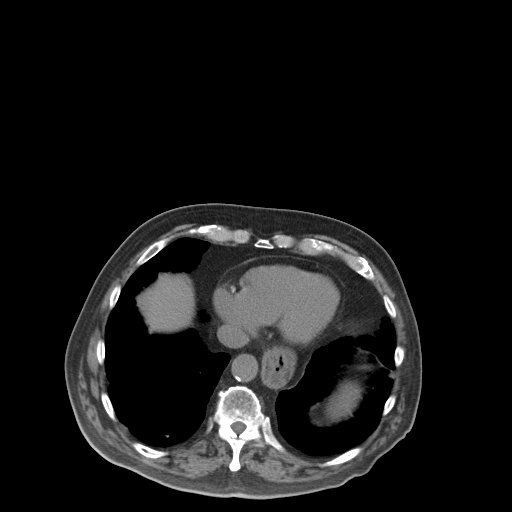

[Series 4: coronal st · coronal · 0.80mm/px · 3 of 136 slices shown]
[im 46/136  soft-tissue]
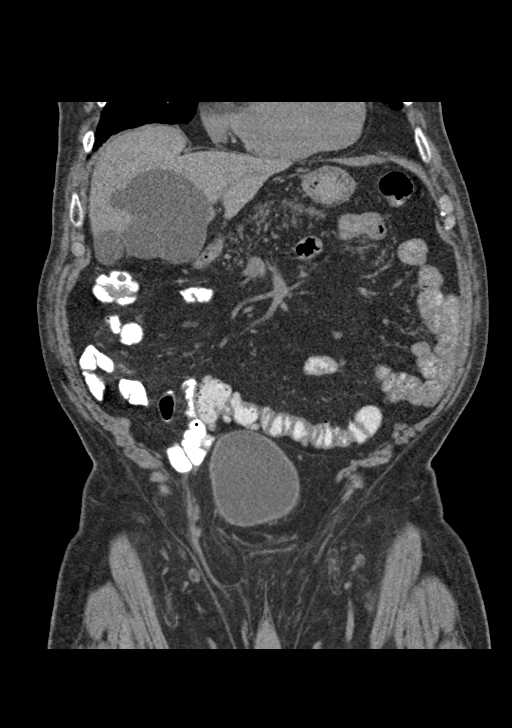
[im 61/136  soft-tissue]
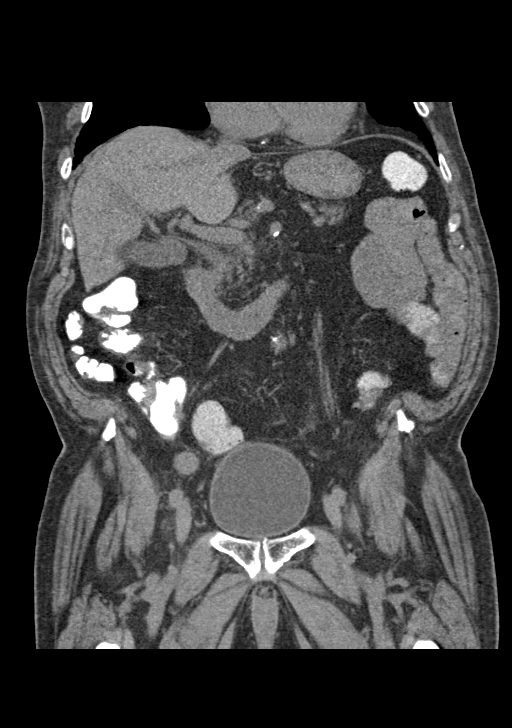
[im 76/136  soft-tissue]
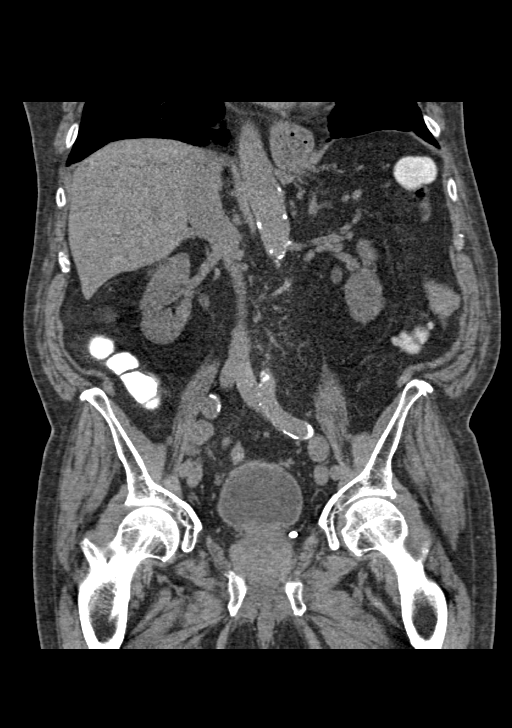

[15 of 46 positions shown; findings below may reference images not displayed]

FINDINGS: Lower chest: Peribronchial thickening and bronchiectasis in RIGHT
lower lobe. Interstitial prominence at BILATERAL lung bases.

Hepatobiliary: Multiple hepatic cysts largest 7.6 x 5.7 cm image 17.
Tiny dependent calculus within gallbladder. Calcified granuloma
RIGHT lobe liver. No additional hepatic lesions.

Pancreas: Partial fatty replacement. 11 x 9 mm nodular focus
proximal pancreatic tail image 19, appears cystic by measurement of
attenuation and unchanged from prior exam. No additional masses.

Spleen: Normal appearance

Adrenals/Urinary Tract: Adrenal glands normal appearance. Question
tiny RIGHT renal cyst 10 x 9 mm diameter image 34. Kidneys and
ureters normal appearance. Minimal bladder wall thickening.

Stomach/Bowel: Small to moderate-sized hiatal hernia. Remainder of
stomach unremarkable. Appendix not definitely visualized but no
pericecal inflammatory process seen. Diverticulosis of distal
descending and sigmoid colon without evidence of diverticulitis.
Remaining bowel loops unremarkable.

Vascular/Lymphatic: Atherosclerotic calcifications of aorta and
iliac arteries without aneurysm. Additional calcified plaque at
origin of celiac artery and SMA. No adenopathy.

Reproductive: Prostatic enlargement, gland measuring 7.0 x 6.0 x
cm (volume = 160 cm^3).

Other: Tiny umbilical hernia containing fat. BILATERAL inguinal
hernias containing fat. No free air or free fluid.

Musculoskeletal: Osseous demineralization with degenerative disc and
facet disease changes lumbar spine.
IMPRESSION: Distal colonic diverticulosis without evidence of diverticulitis.

Small to moderate-sized hiatal hernia.

Hepatic cysts.

Stable tiny cystic lesion within proximal pancreatic tail 11 x 9 mm.

Cholelithiasis.

Prostatic enlargement.

Hiatal hernia.

BILATERAL inguinal hernias and tiny umbilical hernia containing fat.

Chronic bronchitic changes and bronchiectasis in RIGHT lower lobe.

Aortic Atherosclerosis (Q7IFI-Y36.6).

## 2020-08-28 DIAGNOSIS — K219 Gastro-esophageal reflux disease without esophagitis: Secondary | ICD-10-CM | POA: Diagnosis not present

## 2020-08-28 DIAGNOSIS — I10 Essential (primary) hypertension: Secondary | ICD-10-CM | POA: Diagnosis not present

## 2020-08-28 DIAGNOSIS — E782 Mixed hyperlipidemia: Secondary | ICD-10-CM | POA: Diagnosis not present

## 2020-08-28 DIAGNOSIS — N400 Enlarged prostate without lower urinary tract symptoms: Secondary | ICD-10-CM | POA: Diagnosis not present

## 2020-09-09 DIAGNOSIS — H6121 Impacted cerumen, right ear: Secondary | ICD-10-CM | POA: Diagnosis not present

## 2020-10-31 DIAGNOSIS — K219 Gastro-esophageal reflux disease without esophagitis: Secondary | ICD-10-CM | POA: Diagnosis not present

## 2020-10-31 DIAGNOSIS — E782 Mixed hyperlipidemia: Secondary | ICD-10-CM | POA: Diagnosis not present

## 2020-10-31 DIAGNOSIS — N400 Enlarged prostate without lower urinary tract symptoms: Secondary | ICD-10-CM | POA: Diagnosis not present

## 2020-10-31 DIAGNOSIS — I10 Essential (primary) hypertension: Secondary | ICD-10-CM | POA: Diagnosis not present

## 2020-12-27 DIAGNOSIS — I119 Hypertensive heart disease without heart failure: Secondary | ICD-10-CM | POA: Diagnosis not present

## 2020-12-27 DIAGNOSIS — E559 Vitamin D deficiency, unspecified: Secondary | ICD-10-CM | POA: Diagnosis not present

## 2020-12-27 DIAGNOSIS — I872 Venous insufficiency (chronic) (peripheral): Secondary | ICD-10-CM | POA: Diagnosis not present

## 2020-12-27 DIAGNOSIS — Z0000 Encounter for general adult medical examination without abnormal findings: Secondary | ICD-10-CM | POA: Diagnosis not present

## 2020-12-27 DIAGNOSIS — K5900 Constipation, unspecified: Secondary | ICD-10-CM | POA: Diagnosis not present

## 2020-12-27 DIAGNOSIS — E782 Mixed hyperlipidemia: Secondary | ICD-10-CM | POA: Diagnosis not present

## 2021-01-03 DIAGNOSIS — M170 Bilateral primary osteoarthritis of knee: Secondary | ICD-10-CM | POA: Diagnosis not present

## 2021-01-13 DIAGNOSIS — I119 Hypertensive heart disease without heart failure: Secondary | ICD-10-CM | POA: Diagnosis not present

## 2021-01-17 DIAGNOSIS — I10 Essential (primary) hypertension: Secondary | ICD-10-CM | POA: Diagnosis not present

## 2021-01-17 DIAGNOSIS — N400 Enlarged prostate without lower urinary tract symptoms: Secondary | ICD-10-CM | POA: Diagnosis not present

## 2021-01-17 DIAGNOSIS — K219 Gastro-esophageal reflux disease without esophagitis: Secondary | ICD-10-CM | POA: Diagnosis not present

## 2021-01-17 DIAGNOSIS — E782 Mixed hyperlipidemia: Secondary | ICD-10-CM | POA: Diagnosis not present

## 2021-01-21 DIAGNOSIS — L570 Actinic keratosis: Secondary | ICD-10-CM | POA: Diagnosis not present

## 2021-01-21 DIAGNOSIS — X32XXXD Exposure to sunlight, subsequent encounter: Secondary | ICD-10-CM | POA: Diagnosis not present

## 2021-01-22 DIAGNOSIS — H02132 Senile ectropion of right lower eyelid: Secondary | ICD-10-CM | POA: Diagnosis not present

## 2021-01-22 DIAGNOSIS — H40011 Open angle with borderline findings, low risk, right eye: Secondary | ICD-10-CM | POA: Diagnosis not present

## 2021-01-22 DIAGNOSIS — H02135 Senile ectropion of left lower eyelid: Secondary | ICD-10-CM | POA: Diagnosis not present

## 2021-01-22 DIAGNOSIS — H47091 Other disorders of optic nerve, not elsewhere classified, right eye: Secondary | ICD-10-CM | POA: Diagnosis not present

## 2021-02-17 DIAGNOSIS — M179 Osteoarthritis of knee, unspecified: Secondary | ICD-10-CM | POA: Insufficient documentation

## 2021-02-17 DIAGNOSIS — M170 Bilateral primary osteoarthritis of knee: Secondary | ICD-10-CM | POA: Diagnosis not present

## 2021-02-19 DIAGNOSIS — K219 Gastro-esophageal reflux disease without esophagitis: Secondary | ICD-10-CM | POA: Diagnosis not present

## 2021-02-19 DIAGNOSIS — N400 Enlarged prostate without lower urinary tract symptoms: Secondary | ICD-10-CM | POA: Diagnosis not present

## 2021-02-19 DIAGNOSIS — E782 Mixed hyperlipidemia: Secondary | ICD-10-CM | POA: Diagnosis not present

## 2021-02-19 DIAGNOSIS — I10 Essential (primary) hypertension: Secondary | ICD-10-CM | POA: Diagnosis not present

## 2021-03-04 DIAGNOSIS — X32XXXD Exposure to sunlight, subsequent encounter: Secondary | ICD-10-CM | POA: Diagnosis not present

## 2021-03-04 DIAGNOSIS — C44319 Basal cell carcinoma of skin of other parts of face: Secondary | ICD-10-CM | POA: Diagnosis not present

## 2021-03-04 DIAGNOSIS — D044 Carcinoma in situ of skin of scalp and neck: Secondary | ICD-10-CM | POA: Diagnosis not present

## 2021-03-04 DIAGNOSIS — L570 Actinic keratosis: Secondary | ICD-10-CM | POA: Diagnosis not present

## 2021-04-15 DIAGNOSIS — Z08 Encounter for follow-up examination after completed treatment for malignant neoplasm: Secondary | ICD-10-CM | POA: Diagnosis not present

## 2021-04-15 DIAGNOSIS — Z85828 Personal history of other malignant neoplasm of skin: Secondary | ICD-10-CM | POA: Diagnosis not present

## 2021-04-15 DIAGNOSIS — L570 Actinic keratosis: Secondary | ICD-10-CM | POA: Diagnosis not present

## 2021-04-15 DIAGNOSIS — D225 Melanocytic nevi of trunk: Secondary | ICD-10-CM | POA: Diagnosis not present

## 2021-04-23 DIAGNOSIS — H02132 Senile ectropion of right lower eyelid: Secondary | ICD-10-CM | POA: Diagnosis not present

## 2021-04-23 DIAGNOSIS — H0102A Squamous blepharitis right eye, upper and lower eyelids: Secondary | ICD-10-CM | POA: Diagnosis not present

## 2021-04-23 DIAGNOSIS — H0102B Squamous blepharitis left eye, upper and lower eyelids: Secondary | ICD-10-CM | POA: Diagnosis not present

## 2021-04-23 DIAGNOSIS — H02054 Trichiasis without entropian left upper eyelid: Secondary | ICD-10-CM | POA: Diagnosis not present

## 2021-05-27 DIAGNOSIS — Z08 Encounter for follow-up examination after completed treatment for malignant neoplasm: Secondary | ICD-10-CM | POA: Diagnosis not present

## 2021-05-27 DIAGNOSIS — L570 Actinic keratosis: Secondary | ICD-10-CM | POA: Diagnosis not present

## 2021-05-27 DIAGNOSIS — X32XXXD Exposure to sunlight, subsequent encounter: Secondary | ICD-10-CM | POA: Diagnosis not present

## 2021-05-27 DIAGNOSIS — Z85828 Personal history of other malignant neoplasm of skin: Secondary | ICD-10-CM | POA: Diagnosis not present

## 2021-06-30 DIAGNOSIS — I119 Hypertensive heart disease without heart failure: Secondary | ICD-10-CM | POA: Diagnosis not present

## 2021-06-30 DIAGNOSIS — K5900 Constipation, unspecified: Secondary | ICD-10-CM | POA: Diagnosis not present

## 2021-06-30 DIAGNOSIS — N400 Enlarged prostate without lower urinary tract symptoms: Secondary | ICD-10-CM | POA: Diagnosis not present

## 2021-06-30 DIAGNOSIS — I872 Venous insufficiency (chronic) (peripheral): Secondary | ICD-10-CM | POA: Diagnosis not present

## 2021-07-23 DIAGNOSIS — H02132 Senile ectropion of right lower eyelid: Secondary | ICD-10-CM | POA: Diagnosis not present

## 2021-07-23 DIAGNOSIS — H0102A Squamous blepharitis right eye, upper and lower eyelids: Secondary | ICD-10-CM | POA: Diagnosis not present

## 2021-07-23 DIAGNOSIS — H02054 Trichiasis without entropian left upper eyelid: Secondary | ICD-10-CM | POA: Diagnosis not present

## 2021-07-23 DIAGNOSIS — H0102B Squamous blepharitis left eye, upper and lower eyelids: Secondary | ICD-10-CM | POA: Diagnosis not present

## 2021-11-17 DIAGNOSIS — M25552 Pain in left hip: Secondary | ICD-10-CM | POA: Diagnosis not present

## 2021-12-09 DIAGNOSIS — L570 Actinic keratosis: Secondary | ICD-10-CM | POA: Diagnosis not present

## 2021-12-09 DIAGNOSIS — Z08 Encounter for follow-up examination after completed treatment for malignant neoplasm: Secondary | ICD-10-CM | POA: Diagnosis not present

## 2021-12-09 DIAGNOSIS — X32XXXD Exposure to sunlight, subsequent encounter: Secondary | ICD-10-CM | POA: Diagnosis not present

## 2021-12-09 DIAGNOSIS — Z85828 Personal history of other malignant neoplasm of skin: Secondary | ICD-10-CM | POA: Diagnosis not present

## 2022-01-08 DIAGNOSIS — I872 Venous insufficiency (chronic) (peripheral): Secondary | ICD-10-CM | POA: Diagnosis not present

## 2022-01-08 DIAGNOSIS — E782 Mixed hyperlipidemia: Secondary | ICD-10-CM | POA: Diagnosis not present

## 2022-01-08 DIAGNOSIS — E559 Vitamin D deficiency, unspecified: Secondary | ICD-10-CM | POA: Diagnosis not present

## 2022-01-08 DIAGNOSIS — K5900 Constipation, unspecified: Secondary | ICD-10-CM | POA: Diagnosis not present

## 2022-01-08 DIAGNOSIS — I119 Hypertensive heart disease without heart failure: Secondary | ICD-10-CM | POA: Diagnosis not present

## 2022-01-08 DIAGNOSIS — Z0000 Encounter for general adult medical examination without abnormal findings: Secondary | ICD-10-CM | POA: Diagnosis not present

## 2022-01-14 DIAGNOSIS — M25552 Pain in left hip: Secondary | ICD-10-CM | POA: Diagnosis not present

## 2022-03-21 ENCOUNTER — Encounter (HOSPITAL_COMMUNITY): Payer: Self-pay

## 2022-03-21 ENCOUNTER — Other Ambulatory Visit: Payer: Self-pay

## 2022-03-21 ENCOUNTER — Inpatient Hospital Stay (HOSPITAL_COMMUNITY)
Admission: EM | Admit: 2022-03-21 | Discharge: 2022-03-25 | DRG: 378 | Disposition: A | Discharge status: Home Health | Payer: Medicare Other | Attending: Internal Medicine | Admitting: Internal Medicine

## 2022-03-21 DIAGNOSIS — R001 Bradycardia, unspecified: Secondary | ICD-10-CM | POA: Diagnosis not present

## 2022-03-21 DIAGNOSIS — D72829 Elevated white blood cell count, unspecified: Secondary | ICD-10-CM | POA: Diagnosis present

## 2022-03-21 DIAGNOSIS — D62 Acute posthemorrhagic anemia: Secondary | ICD-10-CM | POA: Diagnosis present

## 2022-03-21 DIAGNOSIS — Z6825 Body mass index (BMI) 25.0-25.9, adult: Secondary | ICD-10-CM

## 2022-03-21 DIAGNOSIS — R5381 Other malaise: Secondary | ICD-10-CM | POA: Diagnosis present

## 2022-03-21 DIAGNOSIS — M1612 Unilateral primary osteoarthritis, left hip: Secondary | ICD-10-CM | POA: Diagnosis not present

## 2022-03-21 DIAGNOSIS — N400 Enlarged prostate without lower urinary tract symptoms: Secondary | ICD-10-CM | POA: Diagnosis present

## 2022-03-21 DIAGNOSIS — I10 Essential (primary) hypertension: Secondary | ICD-10-CM | POA: Diagnosis not present

## 2022-03-21 DIAGNOSIS — K921 Melena: Secondary | ICD-10-CM | POA: Diagnosis not present

## 2022-03-21 DIAGNOSIS — D696 Thrombocytopenia, unspecified: Secondary | ICD-10-CM | POA: Diagnosis not present

## 2022-03-21 DIAGNOSIS — E441 Mild protein-calorie malnutrition: Secondary | ICD-10-CM | POA: Diagnosis present

## 2022-03-21 DIAGNOSIS — K264 Chronic or unspecified duodenal ulcer with hemorrhage: Secondary | ICD-10-CM | POA: Diagnosis not present

## 2022-03-21 DIAGNOSIS — D649 Anemia, unspecified: Secondary | ICD-10-CM | POA: Diagnosis not present

## 2022-03-21 DIAGNOSIS — D7589 Other specified diseases of blood and blood-forming organs: Secondary | ICD-10-CM | POA: Diagnosis present

## 2022-03-21 DIAGNOSIS — K269 Duodenal ulcer, unspecified as acute or chronic, without hemorrhage or perforation: Secondary | ICD-10-CM

## 2022-03-21 DIAGNOSIS — R739 Hyperglycemia, unspecified: Secondary | ICD-10-CM | POA: Diagnosis present

## 2022-03-21 DIAGNOSIS — K5900 Constipation, unspecified: Secondary | ICD-10-CM | POA: Diagnosis not present

## 2022-03-21 DIAGNOSIS — G8929 Other chronic pain: Secondary | ICD-10-CM | POA: Diagnosis not present

## 2022-03-21 DIAGNOSIS — L989 Disorder of the skin and subcutaneous tissue, unspecified: Secondary | ICD-10-CM | POA: Diagnosis not present

## 2022-03-21 DIAGNOSIS — K922 Gastrointestinal hemorrhage, unspecified: Secondary | ICD-10-CM | POA: Diagnosis present

## 2022-03-21 DIAGNOSIS — Z823 Family history of stroke: Secondary | ICD-10-CM

## 2022-03-21 DIAGNOSIS — Z8719 Personal history of other diseases of the digestive system: Secondary | ICD-10-CM

## 2022-03-21 DIAGNOSIS — K2970 Gastritis, unspecified, without bleeding: Secondary | ICD-10-CM | POA: Diagnosis not present

## 2022-03-21 DIAGNOSIS — L819 Disorder of pigmentation, unspecified: Secondary | ICD-10-CM

## 2022-03-21 DIAGNOSIS — M7062 Trochanteric bursitis, left hip: Secondary | ICD-10-CM | POA: Diagnosis present

## 2022-03-21 DIAGNOSIS — K449 Diaphragmatic hernia without obstruction or gangrene: Secondary | ICD-10-CM | POA: Diagnosis present

## 2022-03-21 DIAGNOSIS — K920 Hematemesis: Secondary | ICD-10-CM | POA: Diagnosis not present

## 2022-03-21 DIAGNOSIS — Z79899 Other long term (current) drug therapy: Secondary | ICD-10-CM

## 2022-03-21 DIAGNOSIS — D539 Nutritional anemia, unspecified: Secondary | ICD-10-CM | POA: Diagnosis present

## 2022-03-21 HISTORY — DX: Essential (primary) hypertension: I10

## 2022-03-21 HISTORY — DX: Pain in unspecified hip: M25.559

## 2022-03-21 HISTORY — DX: Disorder of pigmentation, unspecified: L81.9

## 2022-03-21 HISTORY — DX: Constipation, unspecified: K59.00

## 2022-03-21 LAB — COMPREHENSIVE METABOLIC PANEL
ALT: 17 U/L (ref 0–44)
AST: 28 U/L (ref 15–41)
Albumin: 3.2 g/dL — ABNORMAL LOW (ref 3.5–5.0)
Alkaline Phosphatase: 40 U/L (ref 38–126)
Anion gap: 7 (ref 5–15)
BUN: 43 mg/dL — ABNORMAL HIGH (ref 8–23)
CO2: 22 mmol/L (ref 22–32)
Calcium: 8.7 mg/dL — ABNORMAL LOW (ref 8.9–10.3)
Chloride: 107 mmol/L (ref 98–111)
Creatinine, Ser: 1.14 mg/dL (ref 0.61–1.24)
GFR, Estimated: 60 mL/min — ABNORMAL LOW (ref >60)
Glucose, Bld: 156 mg/dL — ABNORMAL HIGH (ref 70–99)
Potassium: 4.1 mmol/L (ref 3.5–5.1)
Sodium: 136 mmol/L (ref 135–145)
Total Bilirubin: 0.7 mg/dL (ref 0.3–1.2)
Total Protein: 5.9 g/dL — ABNORMAL LOW (ref 6.5–8.1)

## 2022-03-21 LAB — PROTIME-INR
INR: 1.1 (ref 0.8–1.2)
Prothrombin Time: 14.5 seconds (ref 11.4–15.2)

## 2022-03-21 LAB — CBC
HCT: 29 % — ABNORMAL LOW (ref 39.0–52.0)
Hemoglobin: 9.2 g/dL — ABNORMAL LOW (ref 13.0–17.0)
MCH: 34.7 pg — ABNORMAL HIGH (ref 26.0–34.0)
MCHC: 31.7 g/dL (ref 30.0–36.0)
MCV: 109.4 fL — ABNORMAL HIGH (ref 80.0–100.0)
Platelets: 141 10*3/uL — ABNORMAL LOW (ref 150–400)
RBC: 2.65 MIL/uL — ABNORMAL LOW (ref 4.22–5.81)
RDW: 12.8 % (ref 11.5–15.5)
WBC: 14.4 10*3/uL — ABNORMAL HIGH (ref 4.0–10.5)
nRBC: 0 % (ref 0.0–0.2)

## 2022-03-21 LAB — POC OCCULT BLOOD, ED: Fecal Occult Bld: POSITIVE — AB

## 2022-03-21 LAB — HEMOGLOBIN AND HEMATOCRIT, BLOOD
HCT: 28 % — ABNORMAL LOW (ref 39.0–52.0)
Hemoglobin: 9 g/dL — ABNORMAL LOW (ref 13.0–17.0)

## 2022-03-21 LAB — MAGNESIUM: Magnesium: 2.3 mg/dL (ref 1.7–2.4)

## 2022-03-21 LAB — VITAMIN B12: Vitamin B-12: 298 pg/mL (ref 180–914)

## 2022-03-21 LAB — ABO/RH: ABO/RH(D): O NEG

## 2022-03-21 LAB — MRSA NEXT GEN BY PCR, NASAL: MRSA by PCR Next Gen: NOT DETECTED

## 2022-03-21 LAB — PHOSPHORUS: Phosphorus: 3.4 mg/dL (ref 2.5–4.6)

## 2022-03-21 LAB — FOLATE: Folate: 16.1 ng/mL (ref >5.9)

## 2022-03-21 MED ORDER — ONDANSETRON HCL 4 MG/2ML IJ SOLN: 4.0000 mg | Freq: Four times a day (QID) | INTRAMUSCULAR | Status: DC | PRN

## 2022-03-21 MED ORDER — ARTIFICIAL TEARS OPHTHALMIC OINT: 1.0000 | TOPICAL_OINTMENT | Freq: Four times a day (QID) | OPHTHALMIC | Status: DC | PRN

## 2022-03-21 MED ORDER — PANTOPRAZOLE INFUSION (NEW) - SIMPLE MED
8.0000 mg/h | INTRAVENOUS | Status: DC
  Administered 2022-03-21 – 2022-03-22 (×3): 8 mg/h via INTRAVENOUS
  Filled 2022-03-21 (×3): qty 80

## 2022-03-21 MED ORDER — ONDANSETRON HCL 4 MG PO TABS: 4.0000 mg | ORAL_TABLET | Freq: Four times a day (QID) | ORAL | Status: DC | PRN

## 2022-03-21 MED ORDER — LINACLOTIDE 145 MCG PO CAPS
145.0000 ug | ORAL_CAPSULE | Freq: Every day | ORAL | Status: DC
  Administered 2022-03-22: 145 ug via ORAL
  Filled 2022-03-21 (×2): qty 1

## 2022-03-21 MED ORDER — ORAL CARE MOUTH RINSE: 15.0000 mL | OROMUCOSAL | Status: DC | PRN

## 2022-03-21 MED ORDER — CHLORHEXIDINE GLUCONATE CLOTH 2 % EX PADS
6.0000 | MEDICATED_PAD | Freq: Every day | CUTANEOUS | Status: DC
  Administered 2022-03-21 – 2022-03-25 (×3): 6 via TOPICAL

## 2022-03-21 MED ORDER — PANTOPRAZOLE SODIUM 40 MG IV SOLR
40.0000 mg | Freq: Once | INTRAVENOUS | Status: AC
  Administered 2022-03-21: 40 mg via INTRAVENOUS
  Filled 2022-03-21: qty 10

## 2022-03-21 MED ORDER — OXYCODONE-ACETAMINOPHEN 5-325 MG PO TABS
2.0000 | ORAL_TABLET | Freq: Four times a day (QID) | ORAL | Status: DC | PRN
  Filled 2022-03-21: qty 2

## 2022-03-21 MED ORDER — PANTOPRAZOLE SODIUM 40 MG IV SOLR: 40.0000 mg | Freq: Two times a day (BID) | INTRAVENOUS | Status: DC

## 2022-03-21 MED ORDER — ACETAMINOPHEN 650 MG RE SUPP: 650.0000 mg | Freq: Four times a day (QID) | RECTAL | Status: DC | PRN

## 2022-03-21 MED ORDER — ACETAMINOPHEN 325 MG PO TABS
650.0000 mg | ORAL_TABLET | Freq: Four times a day (QID) | ORAL | Status: DC | PRN
  Filled 2022-03-21: qty 2

## 2022-03-21 MED ORDER — POLYETHYLENE GLYCOL 3350 17 G PO PACK
17.0000 g | PACK | Freq: Every day | ORAL | Status: DC
  Administered 2022-03-21 – 2022-03-22 (×2): 17 g via ORAL
  Filled 2022-03-21 (×2): qty 1

## 2022-03-21 MED ORDER — HYDROMORPHONE HCL 1 MG/ML IJ SOLN
0.5000 mg | Freq: Once | INTRAMUSCULAR | Status: AC
  Administered 2022-03-21: 0.5 mg via INTRAVENOUS
  Filled 2022-03-21: qty 1

## 2022-03-21 MED ORDER — OXYCODONE-ACETAMINOPHEN 5-325 MG PO TABS
1.0000 | ORAL_TABLET | Freq: Four times a day (QID) | ORAL | Status: DC | PRN
  Administered 2022-03-21 – 2022-03-23 (×4): 1 via ORAL
  Filled 2022-03-21 (×3): qty 1

## 2022-03-21 MED ORDER — TAMSULOSIN HCL 0.4 MG PO CAPS
0.4000 mg | ORAL_CAPSULE | Freq: Every morning | ORAL | Status: DC
  Administered 2022-03-22 – 2022-03-25 (×4): 0.4 mg via ORAL
  Filled 2022-03-21 (×4): qty 1

## 2022-03-21 NOTE — H&P (View-Only) (Signed)
[facility] Gastroenterology Consultation Note  Referring Provider: No ref. provider found Primary Care Physician:  Tally Joe, MD Primary Gastroenterologist:  Dr. Marca Ancona  Reason for Consultation:  melena  HPI: Alexander Rich is a 88 y.o. male whom we were asked to see for melena.  Over the past few weeks, patient has had intermittent dark black stools.  Over past couple days he has had weakness and then some (?) coffee ground emesis.  Has been taking large dose NSAIDs for left hip pain recently.  Had colonoscopy with Dr. Marca Ancona 2021 with multiple polyps removed (some with high-grade dysplasia and for which repeat colonoscopy 6 months advised, and which does not seem to have been done).  No bowel movement or blood in stool x 2 days.     Past Medical History:  Diagnosis Date   Constipation    Hip pain    Hyperpigmented skin lesions 03/21/2022   Hypertension     Past Surgical History:  Procedure Laterality Date   BACK SURGERY      Prior to Admission medications   Medication Sig Start Date End Date Taking? Authorizing Provider  amLODipine (NORVASC) 2.5 MG tablet Take 2.5 mg by mouth in the morning. 09/12/19  Yes [provider]  ASCORBIC ACID PO Take 1 tablet by mouth in the morning. Vitamin C, unknown strength   Yes [provider]  furosemide (LASIX) 20 MG tablet Take 1 tablet (20 mg total) by mouth daily. Patient taking differently: Take 20 mg by mouth daily as needed for fluid or edema. 02/17/18 05/09/22 Yes Baldo Daub, MD  Ibuprofen (ADVIL LIQUI-GELS MINIS) 200 MG CAPS Take 400 mg by mouth every 4 (four) hours as needed (pain).   Yes [provider]  linaclotide (LINZESS) 145 MCG CAPS capsule Take 145 mcg by mouth daily before breakfast.   Yes [provider]  losartan (COZAAR) 100 MG tablet Take 100 mg by mouth in the morning. 09/12/19  Yes [provider]  Multiple Vitamins-Minerals (ZINC PO) Take 1 tablet by mouth in the morning. Zinc  supplement, unknown strength.   Yes [provider]  oxyCODONE-acetaminophen (PERCOCET/ROXICET) 5-325 MG tablet Take 2 tablets by mouth every 6 (six) hours as needed for pain. 08/15/19  Yes [provider]  Polyethyl Glycol-Propyl Glycol (SYSTANE OP) Place 1 drop into both eyes 4 (four) times daily as needed (dry eyes).   Yes [provider]  tamsulosin (FLOMAX) 0.4 MG CAPS capsule Take 0.4 mg by mouth in the morning. 09/12/19  Yes [provider]    Current Facility-Administered Medications  Medication Dose Route Frequency Provider Last Rate Last Admin   acetaminophen (TYLENOL) tablet 650 mg  650 mg Oral Q6H PRN Bobette Mo, MD       Or   acetaminophen (TYLENOL) suppository 650 mg  650 mg Rectal Q6H PRN Bobette Mo, MD       ondansetron [facility]) tablet 4 mg  4 mg Oral Q6H PRN Bobette Mo, MD       Or   ondansetron [facility]) injection 4 mg  4 mg Intravenous Q6H PRN Bobette Mo, MD       [START ON 03/24/2022] pantoprazole (PROTONIX) injection 40 mg  40 mg Intravenous Q12H Bobette Mo, MD       pantoprozole (PROTONIX) 80 mg /NS 100 mL infusion  8 mg/hr Intravenous Continuous Bobette Mo, MD 10 mL/hr at 03/21/22 1329 8 mg/hr at 03/21/22 1329    Allergies as of  03/21/2022   (No Known Allergies)    Family History  Problem Relation Age of Onset   Kidney disease Mother    CVA Father     Social History   Socioeconomic History   Marital status: Widowed    Spouse name: Not on file   Number of children: Not on file   Years of education: Not on file   Highest education level: Not on file  Occupational History   Not on file  Tobacco Use   Smoking status: Never   Smokeless tobacco: Never  Vaping Use   Vaping Use: Never used  Substance and Sexual Activity   Alcohol use: Never   Drug use: Never   Sexual activity: Not on file  Other Topics Concern   Not on file  Social History Narrative   Not on file    Social Determinants of Health   Financial Resource Strain: Not on file  Food Insecurity: Not on file  Transportation Needs: Not on file  Physical Activity: Not on file  Stress: Not on file  Social Connections: Not on file  Intimate Partner Violence: Not on file    Review of Systems: As per HPI, all others negative  Physical Exam: Vital signs in last 24 hours: Temp:  [97.9 F (36.6 C)-98.4 F (36.9 C)] 98.4 F (36.9 C) (11/18 1313) Pulse Rate:  [76-111] 76 (11/18 1215) Resp:  [16-24] 19 (11/18 1215) BP: (99-126)/(56-70) 126/62 (11/18 1215) SpO2:  [99 %-100 %] 99 % (11/18 1215) Weight:  [81.6 kg] 81.6 kg (11/18 0945)   General:   Alert, elderly, Well-developed, well-nourished, pleasant and cooperative in NAD Head:  Normocephalic and atraumatic. Eyes:  Sclera clear, no icterus.   Conjunctiva slight pale Ears:  Normal auditory acuity. Nose:  No deformity, discharge,  or lesions. Mouth:  No deformity or lesions.  Oropharynx dry, somewhat pale Neck:  Supple; no masses or thyromegaly. Lungs:  No acute distress. Heart:  Regular rate and rhythm Abdomen:  Soft, nontender and nondistended. No masses, hepatosplenomegaly or hernias noted. Normal bowel sounds, without guarding, and without rebound.     Msk:  Symmetrical without gross deformities. Normal posture. Pulses:  Normal pulses noted. Extremities:  Without clubbing or edema. Neurologic:  Alert and  oriented x4;  grossly normal neurologically. Skin:  Occasional scattered ecchymoses and rash; otherwise Intact without significant lesions or rashes. Cervical Nodes:  No significant cervical adenopathy. Psych:  Alert and cooperative. Normal mood and affect.   Lab Results: Recent Labs    03/21/22 1008  WBC 14.4*  HGB 9.2*  HCT 29.0*  PLT 141*   BMET Recent Labs    03/21/22 1008  NA 136  K 4.1  CL 107  CO2 22  GLUCOSE 156*  BUN 43*  CREATININE 1.14  CALCIUM 8.7*   LFT Recent Labs    03/21/22 1008  PROT 5.9*   ALBUMIN 3.2*  AST 28  ALT 17  ALKPHOS 40  BILITOT 0.7   PT/INR Recent Labs    03/21/22 1018  LABPROT 14.5  INR 1.1    Studies/Results: No results found.  Impression:   Melena. Acute blood loss anemia. Coffee ground emesis? Hip pain with high dose NSAID use. Overall constellation of findings most consistent with peptic ulcer.  No melena and no bowel movement x 2 days. Constipation.  Plan:   PPI IV at least BID. No ASA/NSAIDs. Clear liquid diet ok. Follow CBCs. Will manage this presumed peptic ulcer bleed medically, without endoscopy, in light of  patient's age and comorbidities, unless he develops persistent bleeding refractory to medical therapy. Miralax 17 grams po qd for constipation and to "flush out" old blood in GI tract. [facility] will follow.   LOS: 0 days   Yarelli Decelles M  03/21/2022, 2:40 PM  Cell [phone] If no answer or after 5 PM call [phone]

## 2022-03-21 NOTE — Consult Note (Signed)
[facility] Gastroenterology Consultation Note  Referring Provider: No ref. provider found Primary Care Physician:  Tally Joe, MD Primary Gastroenterologist:  Dr. Marca Ancona  Reason for Consultation:  melena  HPI: Alexander Rich is a 88 y.o. male whom we were asked to see for melena.  Over the past few weeks, patient has had intermittent dark black stools.  Over past couple days he has had weakness and then some (?) coffee ground emesis.  Has been taking large dose NSAIDs for left hip pain recently.  Had colonoscopy with Dr. Marca Ancona 2021 with multiple polyps removed (some with high-grade dysplasia and for which repeat colonoscopy 6 months advised, and which does not seem to have been done).  No bowel movement or blood in stool x 2 days.     Past Medical History:  Diagnosis Date   Constipation    Hip pain    Hyperpigmented skin lesions 03/21/2022   Hypertension     Past Surgical History:  Procedure Laterality Date   BACK SURGERY      Prior to Admission medications   Medication Sig Start Date End Date Taking? Authorizing Provider  amLODipine (NORVASC) 2.5 MG tablet Take 2.5 mg by mouth in the morning. 09/12/19  Yes [provider]  ASCORBIC ACID PO Take 1 tablet by mouth in the morning. Vitamin C, unknown strength   Yes [provider]  furosemide (LASIX) 20 MG tablet Take 1 tablet (20 mg total) by mouth daily. Patient taking differently: Take 20 mg by mouth daily as needed for fluid or edema. 02/17/18 05/09/22 Yes Baldo Daub, MD  Ibuprofen (ADVIL LIQUI-GELS MINIS) 200 MG CAPS Take 400 mg by mouth every 4 (four) hours as needed (pain).   Yes [provider]  linaclotide (LINZESS) 145 MCG CAPS capsule Take 145 mcg by mouth daily before breakfast.   Yes [provider]  losartan (COZAAR) 100 MG tablet Take 100 mg by mouth in the morning. 09/12/19  Yes [provider]  Multiple Vitamins-Minerals (ZINC PO) Take 1 tablet by mouth in the morning. Zinc  supplement, unknown strength.   Yes [provider]  oxyCODONE-acetaminophen (PERCOCET/ROXICET) 5-325 MG tablet Take 2 tablets by mouth every 6 (six) hours as needed for pain. 08/15/19  Yes [provider]  Polyethyl Glycol-Propyl Glycol (SYSTANE OP) Place 1 drop into both eyes 4 (four) times daily as needed (dry eyes).   Yes [provider]  tamsulosin (FLOMAX) 0.4 MG CAPS capsule Take 0.4 mg by mouth in the morning. 09/12/19  Yes [provider]    Current Facility-Administered Medications  Medication Dose Route Frequency Provider Last Rate Last Admin   acetaminophen (TYLENOL) tablet 650 mg  650 mg Oral Q6H PRN Bobette Mo, MD       Or   acetaminophen (TYLENOL) suppository 650 mg  650 mg Rectal Q6H PRN Bobette Mo, MD       ondansetron [facility]) tablet 4 mg  4 mg Oral Q6H PRN Bobette Mo, MD       Or   ondansetron [facility]) injection 4 mg  4 mg Intravenous Q6H PRN Bobette Mo, MD       [START ON 03/24/2022] pantoprazole (PROTONIX) injection 40 mg  40 mg Intravenous Q12H Bobette Mo, MD       pantoprozole (PROTONIX) 80 mg /NS 100 mL infusion  8 mg/hr Intravenous Continuous Bobette Mo, MD 10 mL/hr at 03/21/22 1329 8 mg/hr at 03/21/22 1329    Allergies as of  03/21/2022   (No Known Allergies)    Family History  Problem Relation Age of Onset   Kidney disease Mother    CVA Father     Social History   Socioeconomic History   Marital status: Widowed    Spouse name: Not on file   Number of children: Not on file   Years of education: Not on file   Highest education level: Not on file  Occupational History   Not on file  Tobacco Use   Smoking status: Never   Smokeless tobacco: Never  Vaping Use   Vaping Use: Never used  Substance and Sexual Activity   Alcohol use: Never   Drug use: Never   Sexual activity: Not on file  Other Topics Concern   Not on file  Social History Narrative   Not on file    Social Determinants of Health   Financial Resource Strain: Not on file  Food Insecurity: Not on file  Transportation Needs: Not on file  Physical Activity: Not on file  Stress: Not on file  Social Connections: Not on file  Intimate Partner Violence: Not on file    Review of Systems: As per HPI, all others negative  Physical Exam: Vital signs in last 24 hours: Temp:  [97.9 F (36.6 C)-98.4 F (36.9 C)] 98.4 F (36.9 C) (11/18 1313) Pulse Rate:  [76-111] 76 (11/18 1215) Resp:  [16-24] 19 (11/18 1215) BP: (99-126)/(56-70) 126/62 (11/18 1215) SpO2:  [99 %-100 %] 99 % (11/18 1215) Weight:  [81.6 kg] 81.6 kg (11/18 0945)   General:   Alert, elderly, Well-developed, well-nourished, pleasant and cooperative in NAD Head:  Normocephalic and atraumatic. Eyes:  Sclera clear, no icterus.   Conjunctiva slight pale Ears:  Normal auditory acuity. Nose:  No deformity, discharge,  or lesions. Mouth:  No deformity or lesions.  Oropharynx dry, somewhat pale Neck:  Supple; no masses or thyromegaly. Lungs:  No acute distress. Heart:  Regular rate and rhythm Abdomen:  Soft, nontender and nondistended. No masses, hepatosplenomegaly or hernias noted. Normal bowel sounds, without guarding, and without rebound.     Msk:  Symmetrical without gross deformities. Normal posture. Pulses:  Normal pulses noted. Extremities:  Without clubbing or edema. Neurologic:  Alert and  oriented x4;  grossly normal neurologically. Skin:  Occasional scattered ecchymoses and rash; otherwise Intact without significant lesions or rashes. Cervical Nodes:  No significant cervical adenopathy. Psych:  Alert and cooperative. Normal mood and affect.   Lab Results: Recent Labs    03/21/22 1008  WBC 14.4*  HGB 9.2*  HCT 29.0*  PLT 141*   BMET Recent Labs    03/21/22 1008  NA 136  K 4.1  CL 107  CO2 22  GLUCOSE 156*  BUN 43*  CREATININE 1.14  CALCIUM 8.7*   LFT Recent Labs    03/21/22 1008  PROT 5.9*   ALBUMIN 3.2*  AST 28  ALT 17  ALKPHOS 40  BILITOT 0.7   PT/INR Recent Labs    03/21/22 1018  LABPROT 14.5  INR 1.1    Studies/Results: No results found.  Impression:   Melena. Acute blood loss anemia. Coffee ground emesis? Hip pain with high dose NSAID use. Overall constellation of findings most consistent with peptic ulcer.  No melena and no bowel movement x 2 days. Constipation.  Plan:   PPI IV at least BID. No ASA/NSAIDs. Clear liquid diet ok. Follow CBCs. Will manage this presumed peptic ulcer bleed medically, without endoscopy, in light of   patient's age and comorbidities, unless he develops persistent bleeding refractory to medical therapy. Miralax 17 grams po qd for constipation and to "flush out" old blood in GI tract. [facility] will follow.   LOS: 0 days   Ngoc Detjen M  03/21/2022, 2:40 PM  Cell [phone] If no answer or after 5 PM call [phone]  

## 2022-03-21 NOTE — H&P (Signed)
History and Physical    Patient: Alexander Rich VPX:[id number] DOB: 05-Mar-1926 DOA: 03/21/2022 DOS: the patient was seen and examined on 03/21/2022 PCP: Tally Joe, MD  Patient coming from: Home  Chief Complaint:  Chief Complaint  Patient presents with   Weakness   Melena   Hematemesis   HPI: Alexander Rich is a 88 y.o. male with medical history significant of constipation, osteoarthritis of the hips, hypertension, overweight, hyperpigmented macules who is coming to the emergency department with complaints of multiple episodes of melena over the last 3 to 4 days and 2 episodes of hematemesis earlier today associated with mild epigastric discomfort and generalized weakness.  The patient stated he was constipated 4 days ago and used an enema bag.  After that he noticed that he was having melena.  He has been using ibuprofen and Aleve over the last few months for arthritis.  Most recently he has been using ibuprofen liquid capsules and was taking up to 12 oh then a day. He denied fever, chills, rhinorrhea, sore throat, wheezing or hemoptysis.  No chest pain, palpitations, diaphoresis, PND, orthopnea or pitting edema of the lower extremities. No flank pain, dysuria, frequency or hematuria.  No polyuria, polydipsia, polyphagia or blurred vision.   The patient stated that he has not been drinking for over 2 years.  He stated that he was drinking for several years after his wife passed away, but has not had alcohol recently.  ED course: Initial vital signs were temperature 97.9 F, pulse 111, respirations 16, BP 99/66 mmHg and O2 sat 100% on room air.  The patient received pantoprazole 40 mg IVP x1.  Lab work: Fecal occult blood was positive.  CBC showed white count of 14.4, hemoglobin 9.2 g/dL with an MCV of 462.7 fL and platelets 141.  Normal PT and INR.  CMP with normal electrolytes after calcium correction.  Glucose 156, BUN 43 and creatinine 1.14 mg deciliter.  Total protein is 5.9 and  albumin 3.2 g/dL.  The rest of the LFTs were normal.   Review of Systems: As mentioned in the history of present illness. All other systems reviewed and are negative. Past Medical History:  Diagnosis Date   Constipation    Hip pain    Hypertension    Past Surgical History:  Procedure Laterality Date   BACK SURGERY     Social History:  reports that he has never smoked. He has never used smokeless tobacco. He reports that he does not drink alcohol and does not use drugs.  No Known Allergies  Family History  Problem Relation Age of Onset   Kidney disease Mother    CVA Father     Prior to Admission medications   Medication Sig Start Date End Date Taking? Authorizing Provider  amLODipine (NORVASC) 2.5 MG tablet Take 2.5 mg by mouth in the morning. 09/12/19  Yes [provider]  ASCORBIC ACID PO Take 1 tablet by mouth in the morning. Vitamin C, unknown strength   Yes [provider]  furosemide (LASIX) 20 MG tablet Take 1 tablet (20 mg total) by mouth daily. Patient taking differently: Take 20 mg by mouth daily as needed for fluid or edema. 02/17/18 05/09/22 Yes Baldo Daub, MD  Ibuprofen (ADVIL LIQUI-GELS MINIS) 200 MG CAPS Take 400 mg by mouth every 4 (four) hours as needed (pain).   Yes [provider]  linaclotide (LINZESS) 145 MCG CAPS capsule Take 145 mcg by mouth daily before breakfast.   Yes [provider]  losartan (COZAAR) 100 MG tablet Take 100 mg by mouth in the morning. 09/12/19  Yes [provider]  Multiple Vitamins-Minerals (ZINC PO) Take 1 tablet by mouth in the morning. Zinc supplement, unknown strength.   Yes [provider]  oxyCODONE-acetaminophen (PERCOCET/ROXICET) 5-325 MG tablet Take 2 tablets by mouth every 6 (six) hours as needed for pain. 08/15/19  Yes [provider]  Polyethyl Glycol-Propyl Glycol (SYSTANE OP) Place 1 drop into both eyes 4 (four) times daily as needed (dry eyes).   Yes [provider]  tamsulosin (FLOMAX) 0.4 MG CAPS capsule Take 0.4 mg by mouth in the morning. 09/12/19  Yes [provider]    Physical Exam: Vitals:   03/21/22 1130 03/21/22 1200 03/21/22 1215 03/21/22 1313  BP: 117/64 116/70 126/62   Pulse: 76 76 76   Resp: (!) 24 17 19    Temp:    98.4 F (36.9 C)  TempSrc:    Oral  SpO2: 99% 99% 99%   Weight:      Height:       Physical Exam Vitals reviewed.  Constitutional:      General: He is awake. He is not in acute distress.    Appearance: He is overweight. He is ill-appearing.  HENT:     Head: Normocephalic.     Nose: No rhinorrhea.     Mouth/Throat:     Mouth: Mucous membranes are moist.     Pharynx: Oropharynx is clear.  Eyes:     General: Lids are everted, no foreign bodies appreciated. No scleral icterus.    Pupils: Pupils are equal, round, and reactive to light.  Neck:     Vascular: No JVD.  Cardiovascular:     Rate and Rhythm: Normal rate and regular rhythm.     Heart sounds: S1 normal and S2 normal.  Pulmonary:     Effort: Pulmonary effort is normal.     Breath sounds: Normal breath sounds. No wheezing, rhonchi or rales.  Abdominal:     General: Bowel sounds are normal. There is no distension.     Palpations: Abdomen is soft.     Tenderness: There is no abdominal tenderness.  Musculoskeletal:     Cervical back: Neck supple.     Right lower leg: No edema.     Left lower leg: No edema.  Skin:    General: Skin is warm and dry.     Coloration: Skin is pale. Skin is not jaundiced.     Comments: Multiple hyperpigmented macules and patches.  Neurological:     General: No focal deficit present.     Mental Status: He is alert and oriented to person, place, and time.  Psychiatric:        Mood and Affect: Mood normal.        Behavior: Behavior normal. Behavior is cooperative.     Data Reviewed:  There are no new results to review at this time.  Assessment and Plan: Principal Problem:   Acute blood loss  anemia In the setting of:   UGI bleed Admit to stepdown/inpatient. Keep NPO for now. Continue IV fluids. Continue pantoprazole infusion. Monitor H&H. Transfuse as needed. GI consult greatly requested.  Active Problems:   Macrocytosis  Per patient, quit drinking 2 years ago. Check B12 and folic acid level.    Thrombocytopenia Monitor platelet count.    Leukocytosis No fever or other signs/symptoms of infection. This is likely stress related. Follow-up white blood cell count  tomorrow morning.    Mild protein malnutrition (HCC) Protein supplementation once cleared for oral intake.    Hyperglycemia Recheck fasting glucose.    Hypertension Currently normotensive. Has not used antihypertensives in a few days. Hold antihypertensives for now. Monitor blood pressure.    Hyperpigmented skin lesions  Following twice a year with dermatology.    Advance Care Planning:   Code Status: Full Code   Consults: [facility].  Family Communication:   Severity of Illness: The appropriate patient status for this patient is INPATIENT. Inpatient status is judged to be reasonable and necessary in order to provide the required intensity of service to ensure the patient's safety. The patient's presenting symptoms, physical exam findings, and initial radiographic and laboratory data in the context of their chronic comorbidities is felt to place them at high risk for further clinical deterioration. Furthermore, it is not anticipated that the patient will be medically stable for discharge from the hospital within 2 midnights of admission.   * I certify that at the point of admission it is my clinical judgment that the patient will require inpatient hospital care spanning beyond 2 midnights from the point of admission due to high intensity of service, high risk for further deterioration and high frequency of surveillance required.*  Author: Bobette Mo, MD 03/21/2022 1:39 PM  For on call  review [email or web address].   This document was prepared using Dragon voice recognition software and may contain some unintended transcription errors.

## 2022-03-21 NOTE — ED Notes (Signed)
Pt's family member told this Clinical research associate that pt says he hasn't drunk in a few years but really he drinks all the time and they're worried that might be causing the bleeding. Family has had to pick him up off the floor. and also pt 'thinks that if a pill doesn't work in one dose, he stops taking it'. so said pt wouldn't be truthful about what meds he was actually taking or not taking.

## 2022-03-21 NOTE — ED Triage Notes (Signed)
Patient reports that he has been having weakness, black stools since last week and states he has had 5 or 6.  Patient states he vomited twice and it was "red in color."

## 2022-03-21 NOTE — ED Provider Notes (Signed)
[facility]ENCY DEPT Provider Note   CSN: [id number] Arrival date & time: 03/21/22  3875     History  Chief Complaint  Patient presents with   Weakness   Melena   Hematemesis    Alexander Rich is a 88 y.o. male.  HPI 88 year old male presents with black stools and concern for GI bleeding.  The dark stool started 3 or 4 days ago.  Occasionally he thinks he might of seen a little bit of red in there.  This morning he vomited after drinking coffee and then vomited again.  He thinks he might of been a slight bit of red in there as well.  He feels like his abdomen is bloated but denies any abdominal pain.  He denies any new weakness or fatigue though he has been fatigued for several months.  He is not on blood thinners.  He has been having hip pain issues and has been taking ibuprofen 3-4 times a day for couple months.  He denies any known gastric ulcer history.  He states he used to drink alcohol but has not drank in the last 2 years.  Home Medications Prior to Admission medications   Medication Sig Start Date End Date Taking? Authorizing Provider  amLODipine (NORVASC) 2.5 MG tablet Take 2.5 mg by mouth in the morning. 09/12/19  Yes [provider]  ASCORBIC ACID PO Take 1 tablet by mouth in the morning. Vitamin C, unknown strength   Yes [provider]  furosemide (LASIX) 20 MG tablet Take 1 tablet (20 mg total) by mouth daily. Patient taking differently: Take 20 mg by mouth daily as needed for fluid or edema. 02/17/18 05/09/22 Yes Baldo Daub, MD  Ibuprofen (ADVIL LIQUI-GELS MINIS) 200 MG CAPS Take 400 mg by mouth every 4 (four) hours as needed (pain).   Yes [provider]  linaclotide (LINZESS) 145 MCG CAPS capsule Take 145 mcg by mouth daily before breakfast.   Yes [provider]  losartan (COZAAR) 100 MG tablet Take 100 mg by mouth in the morning. 09/12/19  Yes [provider]  Multiple Vitamins-Minerals (ZINC  PO) Take 1 tablet by mouth in the morning. Zinc supplement, unknown strength.   Yes [provider]  oxyCODONE-acetaminophen (PERCOCET/ROXICET) 5-325 MG tablet Take 2 tablets by mouth every 6 (six) hours as needed for pain. 08/15/19  Yes [provider]  Polyethyl Glycol-Propyl Glycol (SYSTANE OP) Place 1 drop into both eyes 4 (four) times daily as needed (dry eyes).   Yes [provider]  tamsulosin (FLOMAX) 0.4 MG CAPS capsule Take 0.4 mg by mouth in the morning. 09/12/19  Yes [provider]      Allergies    Patient has no known allergies.    Review of Systems   Review of Systems  Respiratory:  Negative for shortness of breath.   Cardiovascular:  Negative for chest pain.  Gastrointestinal:  Positive for blood in stool and vomiting. Negative for abdominal pain.    Physical Exam Updated Vital Signs BP (!) 155/131 (BP Location: Left Arm)   Pulse (!) 111   Temp 98.3 F (36.8 C) (Oral)   Resp 19   Ht 5\' 9"  (1.753 m)   Wt 78.6 kg   SpO2 99%   BMI 25.59 kg/m  Physical Exam Vitals and nursing note reviewed. Exam conducted with a chaperone present.  Constitutional:      General: He is not in acute distress.    Appearance: He is well-developed.  He is not ill-appearing or diaphoretic.  HENT:     Head: Normocephalic and atraumatic.  Cardiovascular:     Rate and Rhythm: Normal rate and regular rhythm.     Heart sounds: Normal heart sounds.  Pulmonary:     Effort: Pulmonary effort is normal.     Breath sounds: Normal breath sounds.  Abdominal:     Palpations: Abdomen is soft.     Tenderness: There is no abdominal tenderness.  Genitourinary:    Rectum: Guaiac result positive.     Comments: Dark stool on exam. No fecal impaction. No obvious hemorrhoids.  Skin:    General: Skin is warm and dry.  Neurological:     Mental Status: He is alert.     ED Results / Procedures / Treatments   Labs (all labs ordered are listed, but only abnormal  results are displayed) Labs Reviewed  COMPREHENSIVE METABOLIC PANEL - Abnormal; Notable for the following components:      Result Value   Glucose, Bld 156 (*)    BUN 43 (*)    Calcium 8.7 (*)    Total Protein 5.9 (*)    Albumin 3.2 (*)    GFR, Estimated 60 (*)    All other components within normal limits  CBC - Abnormal; Notable for the following components:   WBC 14.4 (*)    RBC 2.65 (*)    Hemoglobin 9.2 (*)    HCT 29.0 (*)    MCV 109.4 (*)    MCH 34.7 (*)    Platelets 141 (*)    All other components within normal limits  HEMOGLOBIN AND HEMATOCRIT, BLOOD - Abnormal; Notable for the following components:   Hemoglobin 9.0 (*)    HCT 28.0 (*)    All other components within normal limits  POC OCCULT BLOOD, ED - Abnormal; Notable for the following components:   Fecal Occult Bld POSITIVE (*)    All other components within normal limits  MRSA NEXT GEN BY PCR, NASAL  PROTIME-INR  MAGNESIUM  PHOSPHORUS  HEMOGLOBIN AND HEMATOCRIT, BLOOD  VITAMIN B12  FOLATE  TYPE AND SCREEN  ABO/RH    EKG None  Radiology No results found.  Procedures Procedures    Medications Ordered in ED Medications  acetaminophen (TYLENOL) tablet 650 mg (has no administration in time range)    Or  acetaminophen (TYLENOL) suppository 650 mg (has no administration in time range)  ondansetron (ZOFRAN) tablet 4 mg (has no administration in time range)    Or  ondansetron (ZOFRAN) injection 4 mg (has no administration in time range)  pantoprozole (PROTONIX) 80 mg /NS 100 mL infusion (8 mg/hr Intravenous New Bag/Given 03/21/22 1329)  pantoprazole (PROTONIX) injection 40 mg (has no administration in time range)  Oral care mouth rinse (has no administration in time range)  Chlorhexidine Gluconate Cloth 2 % PADS 6 each (6 each Topical Given 03/21/22 1504)  polyethylene glycol (MIRALAX / GLYCOLAX) packet 17 g (17 g Oral Given 03/21/22 1534)  pantoprazole (PROTONIX) injection 40 mg (40 mg Intravenous Given  03/21/22 1017)  pantoprazole (PROTONIX) injection 40 mg (40 mg Intravenous Given 03/21/22 1159)    ED Course/ Medical Decision Making/ A&P                           Medical Decision Making Amount and/or Complexity of Data Reviewed Labs: ordered.    Details: Hemoglobin of 9.2 is significantly lower than baseline of around 4.  Elevated BUN  consistent with upper GI bleed.  Risk Prescription drug management. Decision regarding hospitalization.   Patient has soft but normal blood pressures.  On and off tachycardia by the time I am seeing him his heart rate is normal.  He does not appear ill overall though he does appear to have a GI bleed.  Likely from his significant NSAID use.  Given IV Protonix.  Discussed with Dr. Dulce Sellar who will consult but for now agrees to hold off on octreotide.  Dr. Robb Matar will admit.  At this point I do not think he needs an emergent blood transfusion but if he continues to bleed he may need 1 soon.  No blood thinner use.        Final Clinical Impression(s) / ED Diagnoses Final diagnoses:  Acute GI bleeding    Rx / DC Orders ED Discharge Orders     None         Pricilla Loveless, MD 03/21/22 1549

## 2022-03-22 DIAGNOSIS — N400 Enlarged prostate without lower urinary tract symptoms: Secondary | ICD-10-CM | POA: Diagnosis present

## 2022-03-22 DIAGNOSIS — D7589 Other specified diseases of blood and blood-forming organs: Secondary | ICD-10-CM

## 2022-03-22 DIAGNOSIS — K922 Gastrointestinal hemorrhage, unspecified: Secondary | ICD-10-CM | POA: Diagnosis not present

## 2022-03-22 DIAGNOSIS — R001 Bradycardia, unspecified: Secondary | ICD-10-CM | POA: Diagnosis present

## 2022-03-22 DIAGNOSIS — D72829 Elevated white blood cell count, unspecified: Secondary | ICD-10-CM | POA: Diagnosis not present

## 2022-03-22 DIAGNOSIS — D696 Thrombocytopenia, unspecified: Secondary | ICD-10-CM

## 2022-03-22 DIAGNOSIS — I10 Essential (primary) hypertension: Secondary | ICD-10-CM

## 2022-03-22 LAB — CBC
HCT: 24 % — ABNORMAL LOW (ref 39.0–52.0)
HCT: 24.1 % — ABNORMAL LOW (ref 39.0–52.0)
Hemoglobin: 7.8 g/dL — ABNORMAL LOW (ref 13.0–17.0)
Hemoglobin: 7.7 g/dL — ABNORMAL LOW (ref 13.0–17.0)
MCH: 35.1 pg — ABNORMAL HIGH (ref 26.0–34.0)
MCH: 34.5 pg — ABNORMAL HIGH (ref 26.0–34.0)
MCHC: 32.5 g/dL (ref 30.0–36.0)
MCHC: 32 g/dL (ref 30.0–36.0)
MCV: 108.1 fL — ABNORMAL HIGH (ref 80.0–100.0)
MCV: 108.1 fL — ABNORMAL HIGH (ref 80.0–100.0)
Platelets: 131 10*3/uL — ABNORMAL LOW (ref 150–400)
Platelets: 116 10*3/uL — ABNORMAL LOW (ref 150–400)
RBC: 2.22 MIL/uL — ABNORMAL LOW (ref 4.22–5.81)
RBC: 2.23 MIL/uL — ABNORMAL LOW (ref 4.22–5.81)
RDW: 13.2 % (ref 11.5–15.5)
RDW: 13.1 % (ref 11.5–15.5)
WBC: 9.3 10*3/uL (ref 4.0–10.5)
WBC: 9 10*3/uL (ref 4.0–10.5)
nRBC: 0 % (ref 0.0–0.2)
nRBC: 0 % (ref 0.0–0.2)

## 2022-03-22 LAB — COMPREHENSIVE METABOLIC PANEL
ALT: 16 U/L (ref 0–44)
AST: 30 U/L (ref 15–41)
Albumin: 2.8 g/dL — ABNORMAL LOW (ref 3.5–5.0)
Alkaline Phosphatase: 38 U/L (ref 38–126)
Anion gap: 4 — ABNORMAL LOW (ref 5–15)
BUN: 37 mg/dL — ABNORMAL HIGH (ref 8–23)
CO2: 24 mmol/L (ref 22–32)
Calcium: 8.4 mg/dL — ABNORMAL LOW (ref 8.9–10.3)
Chloride: 110 mmol/L (ref 98–111)
Creatinine, Ser: 1.07 mg/dL (ref 0.61–1.24)
GFR, Estimated: >60 mL/min (ref >60)
Glucose, Bld: 114 mg/dL — ABNORMAL HIGH (ref 70–99)
Potassium: 4.4 mmol/L (ref 3.5–5.1)
Sodium: 138 mmol/L (ref 135–145)
Total Bilirubin: 0.8 mg/dL (ref 0.3–1.2)
Total Protein: 5 g/dL — ABNORMAL LOW (ref 6.5–8.1)

## 2022-03-22 MED ORDER — HYDRALAZINE HCL 20 MG/ML IJ SOLN: 10.0000 mg | Freq: Four times a day (QID) | INTRAMUSCULAR | Status: DC | PRN

## 2022-03-22 MED ORDER — PANTOPRAZOLE SODIUM 40 MG IV SOLR
40.0000 mg | Freq: Two times a day (BID) | INTRAVENOUS | Status: DC
  Administered 2022-03-22 – 2022-03-23 (×4): 40 mg via INTRAVENOUS
  Filled 2022-03-22 (×4): qty 10

## 2022-03-22 NOTE — Plan of Care (Signed)
  Problem: Education: Goal: Knowledge of General Education information will improve Description: Including pain rating scale, medication(s)/side effects and non-pharmacologic comfort measures Outcome: Progressing   Problem: Health Behavior/Discharge Planning: Goal: Ability to manage health-related needs will improve Outcome: Progressing   Problem: Clinical Measurements: Goal: Ability to maintain clinical measurements within normal limits will improve Outcome: Progressing Goal: Will remain free from infection Outcome: Progressing Goal: Respiratory complications will improve Outcome: Progressing Goal: Cardiovascular complication will be avoided Outcome: Progressing   Problem: Activity: Goal: Risk for activity intolerance will decrease Outcome: Progressing   Problem: Coping: Goal: Level of anxiety will decrease Outcome: Progressing   Problem: Elimination: Goal: Will not experience complications related to bowel motility Outcome: Progressing Goal: Will not experience complications related to urinary retention Outcome: Progressing   Problem: Pain Managment: Goal: General experience of comfort will improve Outcome: Progressing   Problem: Safety: Goal: Ability to remain free from injury will improve Outcome: Progressing   Problem: Skin Integrity: Goal: Risk for impaired skin integrity will decrease Outcome: Progressing   Problem: Clinical Measurements: Goal: Diagnostic test results will improve Outcome: Not Progressing   Problem: Nutrition: Goal: Adequate nutrition will be maintained Outcome: Not Progressing

## 2022-03-22 NOTE — Hospital Course (Signed)
88 year old male with past medical history of hypertension, benign prostatic hyperplasia who presented to [facility] emergency department with melena, hematemesis, epigastric pain and generalized weakness.  Upon further investigation it was determined the patient has been taking large doses of NSAIDs for left hip pain for period time leading up to his presentation.  Upon evaluation in the emergency department patient was felt to be suffering from a likely upper gastrointestinal bleed, possibly secondary to NSAID use.  Hospice group was then called to assess the patient for admission to the hospital.  Patient was initially admitted to the stepdown unit and initiated on intravenous proton pump inhibitor.  Dr. Dulce Sellar with [facility] gastroenterology was consulted with initial recommended conservative management.  Over the course of the hospitalization patient began to experience multiple maroon-colored stools with a precipitous drop in hemoglobin to 6.8, down from a baseline of 14.5.  3 unit packed red blood cell transfusion was initiated with plans for endoscopic intervention on 11/21.

## 2022-03-22 NOTE — Progress Notes (Signed)
Subjective:  No abdominal pain. Some scant melena yesterday. No hematemesis.  Objective: Vital signs in last 24 hours: Temp:  [97.5 F (36.4 C)-99.3 F (37.4 C)] 97.9 F (36.6 C) (11/19 1148) Pulse Rate:  [73-111] 73 (11/19 1100) Resp:  [9-24] 16 (11/19 1200) BP: (110-155)/(43-131) 114/52 (11/19 1200) SpO2:  [97 %-100 %] 100 % (11/19 1100) Weight:  [78.6 kg] 78.6 kg (11/18 1450) Weight change:  Last BM Date : 03/21/22  PE: GEN:  Elderly, NAD ABD:  Soft, non-tender  Lab Results: CBC    Component Value Date/Time   WBC 9.0 03/22/2022 0856   RBC 2.23 (L) 03/22/2022 0856   HGB 7.7 (L) 03/22/2022 0856   HCT 24.1 (L) 03/22/2022 0856   PLT 116 (L) 03/22/2022 0856   MCV 108.1 (H) 03/22/2022 0856   MCH 34.5 (H) 03/22/2022 0856   MCHC 32.0 03/22/2022 0856   RDW 13.1 03/22/2022 0856   LYMPHSABS 2.1 09/26/2019 0929   MONOABS 0.7 09/26/2019 0929   EOSABS 0.1 09/26/2019 0929   BASOSABS 0.0 09/26/2019 0929  CMP     Component Value Date/Time   NA 138 03/22/2022 0252   K 4.4 03/22/2022 0252   CL 110 03/22/2022 0252   CO2 24 03/22/2022 0252   GLUCOSE 114 (H) 03/22/2022 0252   BUN 37 (H) 03/22/2022 0252   CREATININE 1.07 03/22/2022 0252   CALCIUM 8.4 (L) 03/22/2022 0252   PROT 5.0 (L) 03/22/2022 0252   ALBUMIN 2.8 (L) 03/22/2022 0252   AST 30 03/22/2022 0252   ALT 16 03/22/2022 0252   ALKPHOS 38 03/22/2022 0252   BILITOT 0.8 03/22/2022 0252   GFRNONAA >60 03/22/2022 0252   GFRAA >60 09/26/2019 0929   Assessment:   Melena. Acute blood loss anemia. Coffee ground emesis? Hip pain with high dose NSAID use. Overall constellation of findings most consistent with peptic ulcer.  No melena and no bowel movement x 2 days. Constipation.  Plan:   Continue PPI and Miralax. Advance to soft diet. Follow CBCs.  No ASA/NSAIDs. If drop hemoglobin +/- further melena (which, today, seem more like passage of old blood) over the next day or two, would consider endoscopy. [facility]  will follow.   Freddy Jaksch 03/22/2022, 12:57 PM   Cell [phone] If no answer or after 5 PM call [phone]

## 2022-03-22 NOTE — Plan of Care (Signed)

## 2022-03-22 NOTE — Progress Notes (Signed)
PROGRESS NOTE   Alexander Rich  OMV:[id number] DOB: [birth year] DOA: 03/21/2022 PCP: Tally Joe, MD   Date of Service: the patient was seen and examined on 03/22/2022  Brief Narrative:  88 year old male with past medical history of hypertension, benign prostatic hyperplasia who presented to [facility] emergency department with melena, hematemesis, epigastric pain and generalized weakness.  Upon further investigation it was determined the patient has been taking large doses of NSAIDs for left hip pain for period time leading up to his presentation.  Upon evaluation in the emergency department patient was felt to be suffering from a likely upper gastrointestinal bleed, possibly secondary to NSAID use.  Hospice group was then called to assess the patient for admission to the hospital.  Patient was initially admitted to the stepdown unit and initiated on intravenous proton pump habitus.  Dr. Dulce Sellar with [facility] gastroenterology was consulted with initial recommended strategy to manage this without endoscopic intervention.   Assessment and Plan: Problem  Acute Upper Gastrointestinal Bleeding  Hemoglobin currently stable Patient did experience at least 1 episode of blood in the stool approximately 12 hours ago according to the bedside nurse Hemodynamically stable Keeping on clear liquids for now Transitioning from Protonix infusion to Protonix 40 mg IV every 12 hours Transferring to medical telemetry bed considering ongoing hemodynamic stability and minimal bleeding   Leukocytosis  Likely stress-induced No clinical evidence of infection   Essential Hypertension  Normotensive despite not being on any antihypertensive therapy As needed intravenous antihypertensives for markedly elevated blood pressure   Macrocytosis  Vitamin B12 and folate unremarkable   Thrombocytopenia (Hcc)  Monitoring with serial CBCs Relatively stable   Benign Prostatic Hyperplasia Without  Urinary Obstruction  Continue home regimen of Flomax        Subjective:  Patient states that he has not had any blood blood in his stool or melena this morning.  Patient denies any associated abdominal pain.  Patient denies any hematemesis.  Nursing reports several episodes of severe bradycardia into the 40s overnight without associated symptoms or hypotension.  Physical Exam:  Vitals:   03/21/22 2000 03/22/22 0000 03/22/22 0400 03/22/22 0720  BP:    136/65  Pulse: 73 74 82 92  Resp: (!) 21  18 (!) 24  Temp: 99.3 F (37.4 C) 98.3 F (36.8 C) 98.5 F (36.9 C) 98 F (36.7 C)  TempSrc: Oral Oral Oral Oral  SpO2: 98% 98% 99% 99%  Weight:      Height:        Constitutional: Awake alert and oriented x3, no associated distress.   Skin: Numerous scaly lesions on the scalp and face, possible actinic keratosis.  Poor skin turgor. Eyes: Pupils are equally reactive to light.  No evidence of scleral icterus or conjunctival pallor.  ENMT: Moist mucous membranes noted.  Posterior pharynx clear of any exudate or lesions.   Respiratory: clear to auscultation bilaterally, no wheezing, no crackles. Normal respiratory effort. No accessory muscle use.  Cardiovascular: Regular rate and rhythm, no murmurs / rubs / gallops. No extremity edema. 2+ pedal pulses. No carotid bruits.  Abdomen: Abdomen is soft and nontender.  No evidence of intra-abdominal masses.  Positive bowel sounds noted in all quadrants.   Musculoskeletal: No joint deformity upper and lower extremities. Good ROM, no contractures. Normal muscle tone.    Data Reviewed:  I have personally reviewed and interpreted labs, imaging.  Significant findings are   CBC: Recent Labs  Lab 03/21/22 1008 03/21/22 1506 03/22/22 0252  WBC 14.4*  --  9.3  HGB 9.2* 9.0* 7.8*  HCT 29.0* 28.0* 24.0*  MCV 109.4*  --  108.1*  PLT 141*  --  131*   Basic Metabolic Panel: Recent Labs  Lab 03/21/22 1008 03/22/22 0252  NA 136 138  K 4.1  4.4  CL 107 110  CO2 22 24  GLUCOSE 156* 114*  BUN 43* 37*  CREATININE 1.14 1.07  CALCIUM 8.7* 8.4*  MG 2.3  --   PHOS 3.4  --    GFR: Estimated Creatinine Clearance: 43.1 mL/min (by C-G formula based on SCr of 1.07 mg/dL). Liver Function Tests: Recent Labs  Lab 03/21/22 1008 03/22/22 0252  AST 28 30  ALT 17 16  ALKPHOS 40 38  BILITOT 0.7 0.8  PROT 5.9* 5.0*  ALBUMIN 3.2* 2.8*    Coagulation Profile: Recent Labs  Lab 03/21/22 1018  INR 1.1     EKG/Telemetry: Personally reviewed.  Rhythm is normal sinus rhythm with heart rate of 90 bpm.  No dynamic ST segment changes appreciated.   Code Status:  Full code.  Code status decision has been confirmed with: patient Family Communication: deferred    Severity of Illness:  The appropriate patient status for this patient is INPATIENT. Inpatient status is judged to be reasonable and necessary in order to provide the required intensity of service to ensure the patient's safety. The patient's presenting symptoms, physical exam findings, and initial radiographic and laboratory data in the context of their chronic comorbidities is felt to place them at high risk for further clinical deterioration. Furthermore, it is not anticipated that the patient will be medically stable for discharge from the hospital within 2 midnights of admission.   * I certify that at the point of admission it is my clinical judgment that the patient will require inpatient hospital care spanning beyond 2 midnights from the point of admission due to high intensity of service, high risk for further deterioration and high frequency of surveillance required.*  Time spent:  51 minutes  Author:  Marinda Elk MD  03/22/2022 7:40 AM

## 2022-03-23 DIAGNOSIS — I10 Essential (primary) hypertension: Secondary | ICD-10-CM | POA: Diagnosis not present

## 2022-03-23 DIAGNOSIS — D696 Thrombocytopenia, unspecified: Secondary | ICD-10-CM

## 2022-03-23 DIAGNOSIS — D72829 Elevated white blood cell count, unspecified: Secondary | ICD-10-CM

## 2022-03-23 DIAGNOSIS — D7589 Other specified diseases of blood and blood-forming organs: Secondary | ICD-10-CM

## 2022-03-23 DIAGNOSIS — N400 Enlarged prostate without lower urinary tract symptoms: Secondary | ICD-10-CM

## 2022-03-23 DIAGNOSIS — K922 Gastrointestinal hemorrhage, unspecified: Secondary | ICD-10-CM

## 2022-03-23 LAB — COMPREHENSIVE METABOLIC PANEL
ALT: 14 U/L (ref 0–44)
AST: 33 U/L (ref 15–41)
Albumin: 2.6 g/dL — ABNORMAL LOW (ref 3.5–5.0)
Alkaline Phosphatase: 46 U/L (ref 38–126)
Anion gap: 4 — ABNORMAL LOW (ref 5–15)
BUN: 28 mg/dL — ABNORMAL HIGH (ref 8–23)
CO2: 23 mmol/L (ref 22–32)
Calcium: 8 mg/dL — ABNORMAL LOW (ref 8.9–10.3)
Chloride: 107 mmol/L (ref 98–111)
Creatinine, Ser: 1.06 mg/dL (ref 0.61–1.24)
GFR, Estimated: >60 mL/min (ref >60)
Glucose, Bld: 115 mg/dL — ABNORMAL HIGH (ref 70–99)
Potassium: 4 mmol/L (ref 3.5–5.1)
Sodium: 134 mmol/L — ABNORMAL LOW (ref 135–145)
Total Bilirubin: 0.9 mg/dL (ref 0.3–1.2)
Total Protein: 4.7 g/dL — ABNORMAL LOW (ref 6.5–8.1)

## 2022-03-23 LAB — CBC WITH DIFFERENTIAL/PLATELET
Abs Immature Granulocytes: 0.03 10*3/uL (ref 0.00–0.07)
Basophils Absolute: 0 10*3/uL (ref 0.0–0.1)
Basophils Relative: 0 %
Eosinophils Absolute: 0 10*3/uL (ref 0.0–0.5)
Eosinophils Relative: 0 %
HCT: 21 % — ABNORMAL LOW (ref 39.0–52.0)
Hemoglobin: 6.8 g/dL — CL (ref 13.0–17.0)
Immature Granulocytes: 0 %
Lymphocytes Relative: 27 %
Lymphs Abs: 1.9 10*3/uL (ref 0.7–4.0)
MCH: 35.2 pg — ABNORMAL HIGH (ref 26.0–34.0)
MCHC: 32.4 g/dL (ref 30.0–36.0)
MCV: 108.8 fL — ABNORMAL HIGH (ref 80.0–100.0)
Monocytes Absolute: 0.9 10*3/uL (ref 0.1–1.0)
Monocytes Relative: 12 %
Neutro Abs: 4.2 10*3/uL (ref 1.7–7.7)
Neutrophils Relative %: 61 %
Platelets: 109 10*3/uL — ABNORMAL LOW (ref 150–400)
RBC: 1.93 MIL/uL — ABNORMAL LOW (ref 4.22–5.81)
RDW: 13.2 % (ref 11.5–15.5)
WBC: 7.1 10*3/uL (ref 4.0–10.5)
nRBC: 0 % (ref 0.0–0.2)

## 2022-03-23 LAB — PREPARE RBC (CROSSMATCH)

## 2022-03-23 LAB — CBC
HCT: 28.5 % — ABNORMAL LOW (ref 39.0–52.0)
Hemoglobin: 9.4 g/dL — ABNORMAL LOW (ref 13.0–17.0)
MCH: 33.9 pg (ref 26.0–34.0)
MCHC: 33 g/dL (ref 30.0–36.0)
MCV: 102.9 fL — ABNORMAL HIGH (ref 80.0–100.0)
Platelets: 106 10*3/uL — ABNORMAL LOW (ref 150–400)
RBC: 2.77 MIL/uL — ABNORMAL LOW (ref 4.22–5.81)
RDW: 15.9 % — ABNORMAL HIGH (ref 11.5–15.5)
WBC: 8.2 10*3/uL (ref 4.0–10.5)
nRBC: 0 % (ref 0.0–0.2)

## 2022-03-23 LAB — MAGNESIUM: Magnesium: 2.2 mg/dL (ref 1.7–2.4)

## 2022-03-23 MED ORDER — SODIUM CHLORIDE 0.9% IV SOLUTION: Freq: Once | INTRAVENOUS | Status: AC

## 2022-03-23 MED ORDER — OXYCODONE-ACETAMINOPHEN 5-325 MG PO TABS: 1.0000 | ORAL_TABLET | ORAL | Status: DC | PRN

## 2022-03-23 MED ORDER — SODIUM CHLORIDE 0.9% IV SOLUTION: Freq: Once | INTRAVENOUS | Status: DC

## 2022-03-23 NOTE — Progress Notes (Signed)
Patient had an episode of dark red stool. He reports feeling weak. He is in the recliner with bed alarm on. Verbalizes understanding of fall prevention and not to get up without staff assistance.

## 2022-03-23 NOTE — Progress Notes (Signed)
PROGRESS NOTE   Alexander Rich  GEX:[id number] DOB: 24-May-1925 DOA: 03/21/2022 PCP: Tally Joe, MD   Date of Service: the patient was seen and examined on 03/23/2022  Brief Narrative:  88 year old male with past medical history of hypertension, benign prostatic hyperplasia who presented to [facility] emergency department with melena, hematemesis, epigastric pain and generalized weakness.  Upon further investigation it was determined the patient has been taking large doses of NSAIDs for left hip pain for period time leading up to his presentation.  Upon evaluation in the emergency department patient was felt to be suffering from a likely upper gastrointestinal bleed, possibly secondary to NSAID use.  Hospice group was then called to assess the patient for admission to the hospital.  Patient was initially admitted to the stepdown unit and initiated on intravenous proton pump inhibitor.  Dr. Dulce Sellar with [facility] gastroenterology was consulted with initial recommended conservative management.  Over the course of the hospitalization patient began to experience multiple maroon-colored stools with a precipitous drop in hemoglobin to 6.8, down from a baseline of 14.5.  3 unit packed red blood cell transfusion was initiated with plans for endoscopic intervention on 11/21.   Assessment and Plan: Problem  Acute Upper Gastrointestinal Bleeding  Hemoglobin dropped substantially overnight to 6.8 Patient continuing to exhibit maroon-colored stools throughout the day 3 units of packed red blood cell transfusion initiated Patient reevaluated by gastroenterology with tentative plans for patient undergo endoscopic intervention on 11/21 Monitoring closely in the progressive unit Keeping on clear liquids for now with patient to be n.p.o. after midnight  Protonix 40 mg IV every 12 hours    Leukocytosis  Likely stress-induced Resolved   Essential Hypertension  Normotensive despite not  being on any antihypertensive therapy As needed intravenous antihypertensives for markedly elevated blood pressure   Macrocytosis  Vitamin B12 and folate unremarkable   Thrombocytopenia (Hcc)  Downtrending slowly Continuing to monitor with serial CBCs   Benign Prostatic Hyperplasia Without Urinary Obstruction  Continue home regimen of Flomax        Subjective:  Patient reports that he feels "terrible."  Patient reports multiple episodes of maroon-colored stools earlier in the day today associated with severe generalized weakness and malaise.  Patient denies any associated chest pain or shortness of breath.  Patient denies abdominal pain.  Physical Exam:  Vitals:   03/23/22 1351 03/23/22 1420 03/23/22 1452 03/23/22 1710  BP: 119/66 119/62 120/64 137/64  Pulse: 90 84 84 79  Resp:  20 18   Temp: 98.5 F (36.9 C) 98.6 F (37 C) 98.6 F (37 C) 98.4 F (36.9 C)  TempSrc: Oral Oral Oral Oral  SpO2: 100% 99% 100% 100%  Weight:      Height:        Constitutional: Awake alert and oriented x3, no associated distress.   Skin: Increased pallor of the skin.  Numerous scaly lesions on the scalp and face, possible actinic keratosis.  Poor skin turgor. Eyes: Pupils are equally reactive to light.  Increased conjunctival pallor without any evidence of scleral icterus. ENMT: Moist mucous membranes noted.  Posterior pharynx clear of any exudate or lesions.   Respiratory: clear to auscultation bilaterally, no wheezing, no crackles. Normal respiratory effort. No accessory muscle use.  Cardiovascular: Regular rate and rhythm, no murmurs / rubs / gallops. No extremity edema. 2+ pedal pulses. No carotid bruits.  Abdomen: Hyperactive bowel sounds, abdomen is soft and nontender.  No evidence of intra-abdominal masses.   Musculoskeletal: No joint deformity  upper and lower extremities. Good ROM, no contractures. Normal muscle tone.    Data Reviewed:  I have personally reviewed and interpreted  labs, imaging.  Significant findings are   CBC: Recent Labs  Lab 03/21/22 1008 03/21/22 1506 03/22/22 0252 03/22/22 0856 03/23/22 0510  WBC 14.4*  --  9.3 9.0 7.1  NEUTROABS  --   --   --   --  4.2  HGB 9.2* 9.0* 7.8* 7.7* 6.8*  HCT 29.0* 28.0* 24.0* 24.1* 21.0*  MCV 109.4*  --  108.1* 108.1* 108.8*  PLT 141*  --  131* 116* 109*    Basic Metabolic Panel: Recent Labs  Lab 03/21/22 1008 03/22/22 0252 03/23/22 0510  NA 136 138 134*  K 4.1 4.4 4.0  CL 107 110 107  CO2 22 24 23   GLUCOSE 156* 114* 115*  BUN 43* 37* 28*  CREATININE 1.14 1.07 1.06  CALCIUM 8.7* 8.4* 8.0*  MG 2.3  --  2.2  PHOS 3.4  --   --     GFR: Estimated Creatinine Clearance: 43.5 mL/min (by C-G formula based on SCr of 1.06 mg/dL). Liver Function Tests: Recent Labs  Lab 03/21/22 1008 03/22/22 0252 03/23/22 0510  AST 28 30 33  ALT 17 16 14   ALKPHOS 40 38 46  BILITOT 0.7 0.8 0.9  PROT 5.9* 5.0* 4.7*  ALBUMIN 3.2* 2.8* 2.6*     Coagulation Profile: Recent Labs  Lab 03/21/22 1018  INR 1.1      EKG/Telemetry: Personally reviewed.  Rhythm is normal sinus rhythm with heart rate of 90 bpm.  No dynamic ST segment changes appreciated.   Code Status:  Full code.  Code status decision has been confirmed with: patient Family Communication: deferred    Severity of Illness:  The appropriate patient status for this patient is INPATIENT. Inpatient status is judged to be reasonable and necessary in order to provide the required intensity of service to ensure the patient's safety. The patient's presenting symptoms, physical exam findings, and initial radiographic and laboratory data in the context of their chronic comorbidities is felt to place them at high risk for further clinical deterioration. Furthermore, it is not anticipated that the patient will be medically stable for discharge from the hospital within 2 midnights of admission.   * I certify that at the point of admission it is my clinical  judgment that the patient will require inpatient hospital care spanning beyond 2 midnights from the point of admission due to high intensity of service, high risk for further deterioration and high frequency of surveillance required.*  Time spent:  58 minutes  Author:  MD  03/23/2022 7:13 PM

## 2022-03-23 NOTE — Progress Notes (Signed)
  Transition of Care [facility]) Screening Note   Patient Details  Name: Alexander Rich Date of Birth: March 26, 1926   Transition of Care [facility] CM/SW Contact:    Lanier Clam, RN Phone Number: 03/23/2022, 2:23 PM    Transition of Care Department [facility]) has reviewed patient and no TOC needs have been identified at this time. We will continue to monitor patient advancement through interdisciplinary progression rounds. If new patient transition needs arise, please place a TOC consult.

## 2022-03-23 NOTE — Progress Notes (Signed)
    OVERNIGHT PROGRESS REPORT  Notified by assigned RN for Hgb 6.8. No obvious bleeding known.  I unit PRBCs ordered with post transfusion H&H .   Chinita Greenland MSNA MSN ACNPC-AG Acute Care Nurse Practitioner [facility]

## 2022-03-23 NOTE — Progress Notes (Signed)
[facility] Gastroenterology Progress Note  LINDA BIEHN 88 y.o. 12/20/1925  CC: Melena   Subjective: Per RN report multiple episodes of melena last night.  Patient states he feels extremely fatigued and the worst he is ever felt in his life.  Tolerating diet without difficulty.  Denies nausea, vomiting, abdominal pain  ROS : Review of Systems  Constitutional:  Positive for malaise/fatigue. Negative for chills, fever and weight loss.  Gastrointestinal:  Positive for melena. Negative for abdominal pain, blood in stool, constipation, diarrhea, heartburn, nausea and vomiting.  Neurological:  Positive for weakness.      Objective: Vital signs in last 24 hours: Vitals:   03/22/22 2345 03/23/22 0308  BP: (!) 105/90 (!) 144/65  Pulse: 95 92  Resp: 18 (!) 24  Temp: 98.7 F (37.1 C) 97.7 F (36.5 C)  SpO2: 100% 100%    Physical Exam:  General:  Elderly male in no distress  Head:  Normocephalic, without obvious abnormality, atraumatic  Eyes:  Anicteric sclera, EOM's intact  Lungs:   Clear to auscultation bilaterally, respirations unlabored  Heart:  Regular rate and rhythm, S1, S2 normal  Abdomen:   Soft, non-tender, bowel sounds active all four quadrants,  no masses,     Lab Results: Recent Labs    03/21/22 1008 03/22/22 0252 03/23/22 0510  NA 136 138 134*  K 4.1 4.4 4.0  CL 107 110 107  CO2 22 24 23   GLUCOSE 156* 114* 115*  BUN 43* 37* 28*  CREATININE 1.14 1.07 1.06  CALCIUM 8.7* 8.4* 8.0*  MG 2.3  --  2.2  PHOS 3.4  --   --    Recent Labs    03/22/22 0252 03/23/22 0510  AST 30 33  ALT 16 14  ALKPHOS 38 46  BILITOT 0.8 0.9  PROT 5.0* 4.7*  ALBUMIN 2.8* 2.6*   Recent Labs    03/22/22 0856 03/23/22 0510  WBC 9.0 7.1  NEUTROABS  --  4.2  HGB 7.7* 6.8*  HCT 24.1* 21.0*  MCV 108.1* 108.8*  PLT 116* 109*   Recent Labs    03/21/22 1018  LABPROT 14.5  INR 1.1      Assessment Melena in the setting of NSAID use -Hgb 6.8 -BUN 28, creatinine  1.06  Plan: Plan for EGD tomorrow. I thoroughly discussed the procedures to include nature, alternatives, benefits, and risks including but not limited to bleeding, perforation, infection, anesthesia/cardiac and pulmonary complications. Patient provides understanding and gave verbal consent to proceed. Continue Protonix IV 40mg  BID NPO at midnight Continue supportive care as needed. [facility] will follow.     03/23/22 PA-C 03/23/2022, 9:46 AM  Contact #  [phone]

## 2022-03-23 NOTE — Progress Notes (Signed)
Hgb is 6.8 this morning. Garner Nash notified.

## 2022-03-24 ENCOUNTER — Inpatient Hospital Stay (HOSPITAL_COMMUNITY): Payer: Medicare Other | Admitting: Certified Registered Nurse Anesthetist

## 2022-03-24 ENCOUNTER — Encounter (HOSPITAL_COMMUNITY)
Admission: EM | Disposition: A | Discharge status: Home Health | Payer: Self-pay | Source: Home / Self Care | Attending: Internal Medicine

## 2022-03-24 DIAGNOSIS — K2970 Gastritis, unspecified, without bleeding: Secondary | ICD-10-CM

## 2022-03-24 DIAGNOSIS — I10 Essential (primary) hypertension: Secondary | ICD-10-CM

## 2022-03-24 DIAGNOSIS — K269 Duodenal ulcer, unspecified as acute or chronic, without hemorrhage or perforation: Secondary | ICD-10-CM

## 2022-03-24 DIAGNOSIS — N400 Enlarged prostate without lower urinary tract symptoms: Secondary | ICD-10-CM | POA: Diagnosis not present

## 2022-03-24 DIAGNOSIS — M1612 Unilateral primary osteoarthritis, left hip: Secondary | ICD-10-CM | POA: Diagnosis present

## 2022-03-24 DIAGNOSIS — R001 Bradycardia, unspecified: Secondary | ICD-10-CM

## 2022-03-24 DIAGNOSIS — K449 Diaphragmatic hernia without obstruction or gangrene: Secondary | ICD-10-CM

## 2022-03-24 DIAGNOSIS — K922 Gastrointestinal hemorrhage, unspecified: Secondary | ICD-10-CM | POA: Diagnosis not present

## 2022-03-24 DIAGNOSIS — D72829 Elevated white blood cell count, unspecified: Secondary | ICD-10-CM | POA: Diagnosis not present

## 2022-03-24 HISTORY — PX: ESOPHAGOGASTRODUODENOSCOPY: SHX5428

## 2022-03-24 LAB — BPAM RBC
Blood Product Expiration Date: 202312042359
Blood Product Expiration Date: 202312052359
ISSUE DATE / TIME: 202311201103
ISSUE DATE / TIME: 202311201429
Unit Type and Rh: 9500
Unit Type and Rh: 9500

## 2022-03-24 LAB — COMPREHENSIVE METABOLIC PANEL
ALT: 19 U/L (ref 0–44)
AST: 41 U/L (ref 15–41)
Albumin: 3.2 g/dL — ABNORMAL LOW (ref 3.5–5.0)
Alkaline Phosphatase: 50 U/L (ref 38–126)
Anion gap: 5 (ref 5–15)
BUN: 20 mg/dL (ref 8–23)
CO2: 24 mmol/L (ref 22–32)
Calcium: 8.4 mg/dL — ABNORMAL LOW (ref 8.9–10.3)
Chloride: 107 mmol/L (ref 98–111)
Creatinine, Ser: 0.89 mg/dL (ref 0.61–1.24)
GFR, Estimated: >60 mL/min (ref >60)
Glucose, Bld: 103 mg/dL — ABNORMAL HIGH (ref 70–99)
Potassium: 4.1 mmol/L (ref 3.5–5.1)
Sodium: 136 mmol/L (ref 135–145)
Total Bilirubin: 1.7 mg/dL — ABNORMAL HIGH (ref 0.3–1.2)
Total Protein: 6 g/dL — ABNORMAL LOW (ref 6.5–8.1)

## 2022-03-24 LAB — TYPE AND SCREEN
ABO/RH(D): O NEG
Antibody Screen: NEGATIVE
Unit division: 0
Unit division: 0

## 2022-03-24 LAB — CBC WITH DIFFERENTIAL/PLATELET
Abs Immature Granulocytes: 0.03 10*3/uL (ref 0.00–0.07)
Basophils Absolute: 0 10*3/uL (ref 0.0–0.1)
Basophils Relative: 0 %
Eosinophils Absolute: 0 10*3/uL (ref 0.0–0.5)
Eosinophils Relative: 0 %
HCT: 30.8 % — ABNORMAL LOW (ref 39.0–52.0)
Hemoglobin: 10.1 g/dL — ABNORMAL LOW (ref 13.0–17.0)
Immature Granulocytes: 0 %
Lymphocytes Relative: 22 %
Lymphs Abs: 1.7 10*3/uL (ref 0.7–4.0)
MCH: 34.2 pg — ABNORMAL HIGH (ref 26.0–34.0)
MCHC: 32.8 g/dL (ref 30.0–36.0)
MCV: 104.4 fL — ABNORMAL HIGH (ref 80.0–100.0)
Monocytes Absolute: 0.7 10*3/uL (ref 0.1–1.0)
Monocytes Relative: 10 %
Neutro Abs: 5.1 10*3/uL (ref 1.7–7.7)
Neutrophils Relative %: 68 %
Platelets: 125 10*3/uL — ABNORMAL LOW (ref 150–400)
RBC: 2.95 MIL/uL — ABNORMAL LOW (ref 4.22–5.81)
RDW: 16 % — ABNORMAL HIGH (ref 11.5–15.5)
WBC: 7.6 10*3/uL (ref 4.0–10.5)
nRBC: 0 % (ref 0.0–0.2)

## 2022-03-24 LAB — MAGNESIUM: Magnesium: 2.3 mg/dL (ref 1.7–2.4)

## 2022-03-24 SURGERY — EGD (ESOPHAGOGASTRODUODENOSCOPY)
Anesthesia: Monitor Anesthesia Care

## 2022-03-24 MED ORDER — LIDOCAINE 2% (20 MG/ML) 5 ML SYRINGE
INTRAMUSCULAR | Status: DC | PRN
  Administered 2022-03-24: 60 mg via INTRAVENOUS

## 2022-03-24 MED ORDER — PROPOFOL 1000 MG/100ML IV EMUL
INTRAVENOUS | Status: AC
  Filled 2022-03-24: qty 100

## 2022-03-24 MED ORDER — PROPOFOL 10 MG/ML IV BOLUS
INTRAVENOUS | Status: AC
  Filled 2022-03-24: qty 20

## 2022-03-24 MED ORDER — PANTOPRAZOLE SODIUM 40 MG PO TBEC
40.0000 mg | DELAYED_RELEASE_TABLET | Freq: Two times a day (BID) | ORAL | Status: DC
  Administered 2022-03-24 – 2022-03-25 (×2): 40 mg via ORAL
  Filled 2022-03-24 (×2): qty 1

## 2022-03-24 MED ORDER — PROPOFOL 500 MG/50ML IV EMUL
INTRAVENOUS | Status: DC | PRN
  Administered 2022-03-24: 75 ug/kg/min via INTRAVENOUS

## 2022-03-24 MED ORDER — SODIUM CHLORIDE 0.9 % IV SOLN: INTRAVENOUS | Status: DC

## 2022-03-24 MED ORDER — LACTATED RINGERS IV SOLN
INTRAVENOUS | Status: AC | PRN
  Administered 2022-03-24: 1000 mL via INTRAVENOUS

## 2022-03-24 MED ORDER — PROPOFOL 10 MG/ML IV BOLUS
INTRAVENOUS | Status: DC | PRN
  Administered 2022-03-24 (×2): 10 mg via INTRAVENOUS

## 2022-03-24 MED ORDER — ADULT MULTIVITAMIN W/MINERALS CH
1.0000 | ORAL_TABLET | Freq: Every day | ORAL | Status: DC
  Administered 2022-03-24 – 2022-03-25 (×2): 1 via ORAL
  Filled 2022-03-24 (×2): qty 1

## 2022-03-24 NOTE — Interval H&P Note (Signed)
History and Physical Interval Note:  03/24/2022 10:17 AM  Alexander Rich  has presented today for surgery, with the diagnosis of melena, anemia.  The various methods of treatment have been discussed with the patient and family. After consideration of risks, benefits and other options for treatment, the patient has consented to  Procedure(s): ESOPHAGOGASTRODUODENOSCOPY (EGD) (N/A) as a surgical intervention.  The patient's history has been reviewed, patient examined, no change in status, stable for surgery.  I have reviewed the patient's chart and labs.  Questions were answered to the patient's satisfaction.     Freddy Jaksch

## 2022-03-24 NOTE — Transfer of Care (Signed)
Immediate Anesthesia Transfer of Care Note  Patient: Alexander Rich  Procedure(s) Performed: ESOPHAGOGASTRODUODENOSCOPY (EGD)  Patient Location: Endoscopy Unit  Anesthesia Type:MAC  Level of Consciousness: drowsy and patient cooperative  Airway & Oxygen Therapy: Patient Spontanous Breathing and Patient connected to face mask oxygen  Post-op Assessment: Report given to RN and Post -op Vital signs reviewed and stable  Post vital signs: Reviewed and stable  Last Vitals:  Vitals Value Taken Time  BP 121/62 03/24/22 1043  Temp    Pulse 76 03/24/22 1043  Resp 18 03/24/22 1043  SpO2 100 % 03/24/22 1043    Last Pain:  Vitals:   03/24/22 1043  TempSrc:   PainSc: 0-No pain      Patients Stated Pain Goal: 0 (29/19/16 6060)  Complications: No notable events documented.

## 2022-03-24 NOTE — Op Note (Signed)
[facility] Patient Name: Alexander Rich Procedure Date: 03/24/2022 MRN: [id number] Attending MD: Willis Modena , MD, [id number] Date of Birth: November 25, 1925 CSN: [id number] Age: 88 Admit Type: Inpatient Procedure:                Upper GI endoscopy Indications:              Melena, Anemia Providers:                Willis Modena, MD, Estella Husk RN, RN, Beryle Beams, Technician, Milinda Cave, CRNA Referring MD:             [facility] Medicines:                Monitored Anesthesia Care Complications:            No immediate complications. Estimated Blood Loss:     Estimated blood loss: none. Procedure:                Pre-Anesthesia Assessment:                           - Prior to the procedure, a History and Physical                            was performed, and patient medications and                            allergies were reviewed. The patient's tolerance of                            previous anesthesia was also reviewed. The risks                            and benefits of the procedure and the sedation                            options and risks were discussed with the patient.                            All questions were answered, and informed consent                            was obtained. Prior Anticoagulants: The patient has                            taken no anticoagulant or antiplatelet agents. ASA                            Grade Assessment: III - A patient with severe                            systemic disease. After reviewing the risks and  benefits, the patient was deemed in satisfactory                            condition to undergo the procedure.                           After obtaining informed consent, the endoscope was                            passed under direct vision. Throughout the                            procedure, the patient's blood pressure, pulse, and                             oxygen saturations were monitored continuously. The                            [id number]-[id number] ([id number]) Olympus slim endoscope was                            introduced through the mouth, and advanced to the                            second part of duodenum. The upper GI endoscopy was                            accomplished without difficulty. The patient                            tolerated the procedure well. Scope In: Scope Out: Findings:      A medium-sized hiatal hernia was present.      The exam of the esophagus was otherwise normal.      The entire examined stomach was normal.      One non-bleeding superficial duodenal ulcer with no stigmata of bleeding       was found in the duodenal bulb. The lesion was 5 mm in largest dimension.      One non-bleeding cratered duodenal ulcer with no stigmata of bleeding       was found in the first portion of the duodenum. The lesion was 8 mm in       largest dimension.      The exam of the duodenum was otherwise normal.      No old or fresh blood was seen to the extent of our examination. Impression:               - Medium-sized hiatal hernia.                           - Normal stomach.                           - Non-bleeding duodenal ulcer with no stigmata of                            bleeding.                           -  Non-bleeding duodenal ulcer with no stigmata of                            bleeding.                           - No specimens collected. Moderate Sedation:      None Recommendation:           - Return patient to hospital ward for ongoing care.                           - Soft diet today.                           - No aspirin, ibuprofen, naproxen, or other                            non-steroidal anti-inflammatory drugs indefinitely.                           - Use Protonix (pantoprazole) 40 mg PO BID for 6                            weeks, then 40 mg po qd (and continue at this dose                             indefinitely).                           [facility] will sign-off; we can arrange outpatient                            follow-up with [place]; please call with any questions;                            thank you for the consultation. Procedure Code(s):        --- Professional ---                           [id number], Esophagogastroduodenoscopy, flexible,                            transoral; diagnostic, including collection of                            specimen(s) by brushing or washing, when performed                            (separate procedure) Diagnosis Code(s):        --- Professional ---                           K44.9, Diaphragmatic hernia without obstruction or                            gangrene  K26.9, Duodenal ulcer, unspecified as acute or                            chronic, without hemorrhage or perforation                           K92.1, Melena (includes Hematochezia)                           D64.9, Anemia, unspecified CPT copyright 2022 American Medical Association. All rights reserved. The codes documented in this report are preliminary and upon coder review may  be revised to meet current compliance requirements. Willis Modena, MD 03/24/2022 11:06:10 AM This report has been signed electronically. Number of Addenda: 0

## 2022-03-24 NOTE — Anesthesia Preprocedure Evaluation (Addendum)
Anesthesia Evaluation  Patient identified by MRN, date of birth, ID band Patient awake    Reviewed: Allergy & Precautions, NPO status , Patient's Chart, lab work & pertinent test results  Airway Mallampati: II  TM Distance: >3 FB Neck ROM: Full    Dental no notable dental hx.    Pulmonary neg pulmonary ROS   Pulmonary exam normal breath sounds clear to auscultation       Cardiovascular hypertension, Pt. on medications Normal cardiovascular exam Rhythm:Regular Rate:Normal     Neuro/Psych negative neurological ROS     GI/Hepatic negative GI ROS, Neg liver ROS,,,  Endo/Other  negative endocrine ROS    Renal/GU negative Renal ROS     Musculoskeletal  (+) Arthritis ,    Abdominal   Peds  Hematology negative hematology ROS (+)   Anesthesia Other Findings   Reproductive/Obstetrics                             Anesthesia Physical Anesthesia Plan  ASA: 3  Anesthesia Plan: MAC   Post-op Pain Management: Minimal or no pain anticipated   Induction: Intravenous  PONV Risk Score and Plan: 1 and Treatment may vary due to age or medical condition, Propofol infusion and TIVA  Airway Management Planned: Simple Face Mask  Additional Equipment:   Intra-op Plan:   Post-operative Plan:   Informed Consent: I have reviewed the patients History and Physical, chart, labs and discussed the procedure including the risks, benefits and alternatives for the proposed anesthesia with the patient or authorized representative who has indicated his/her understanding and acceptance.     Dental advisory given  Plan Discussed with: CRNA  Anesthesia Plan Comments:        Anesthesia Quick Evaluation

## 2022-03-24 NOTE — Care Management Important Message (Signed)
Important Message  Patient Details IM Letter given. Name: Alexander Rich MRN: [id number] Date of Birth: 11-13-1925   Medicare Important Message Given:  Yes     Caren Macadam 03/24/2022, 11:18 AM

## 2022-03-24 NOTE — Progress Notes (Signed)
Initial Nutrition Assessment  INTERVENTION:   -Multivitamin with minerals daily  -Encouraged PO intakes  NUTRITION DIAGNOSIS:   Increased nutrient needs related to acute illness as evidenced by estimated needs.  GOAL:   Patient will meet greater than or equal to 90% of their needs  MONITOR:   PO intake, Supplement acceptance, Labs, Weight trends, I & O's  REASON FOR ASSESSMENT:   Malnutrition Screening Tool    ASSESSMENT:   88 year old male with past medical history of hypertension, benign prostatic hyperplasia who presented to [facility] emergency department with melena, hematemesis, epigastric pain and generalized weakness.  Patient sitting in bed, just received lunch meal. Pt states he feel much better today. On a soft diet following EGD this morning. Pt states he doesn't eat healthy at home, he lives alone so doesn't cook much. Consumes peanut butter crackers, bologna and cheese sandwiches and food from [employer]. Will order daily MVI as pt has not consumed a high quality diet. Reports weight loss but was unable to provide details.  Lab came in for blood draw at end of visit. Encouraged pt to eat his lunch.  Medications: Lactated ringers  Labs reviewed.  NUTRITION - FOCUSED PHYSICAL EXAM:  Flowsheet Row Most Recent Value  Orbital Region Mild depletion  Upper Arm Region No depletion  Thoracic and Lumbar Region No depletion  Buccal Region No depletion  Temple Region Mild depletion  Clavicle Bone Region No depletion  Clavicle and Acromion Bone Region No depletion  Scapular Bone Region No depletion  Dorsal Hand No depletion  Patellar Region No depletion  Anterior Thigh Region No depletion  Posterior Calf Region No depletion  Edema (RD Assessment) Mild  [BLE]  Hair Reviewed  Eyes Reviewed  [bright red conjunctiva]  Mouth Reviewed  Skin Reviewed       Diet Order:   Diet Order             DIET SOFT Room service appropriate? Yes; Fluid  consistency: Thin  Diet effective now                   EDUCATION NEEDS:   No education needs have been identified at this time  Skin:  Skin Assessment: Reviewed RN Assessment  Last BM:  11/20 -type 6  Height:   Ht Readings from Last 1 Encounters:  03/21/22 5\' 9"  (1.753 m)    Weight:   Wt Readings from Last 1 Encounters:  03/21/22 78.6 kg   BMI:  Body mass index is 25.59 kg/m.  Estimated Nutritional Needs:   Kcal:  1700-1900  Protein:  85-95g  Fluid:  1.9L/day   03/23/22, MS, RD, LDN Inpatient Clinical Dietitian Contact information available via Amion

## 2022-03-24 NOTE — Progress Notes (Addendum)
PROGRESS NOTE   Alexander Rich  PZW:[id number] DOB: 18-Jun-1925 DOA: 03/21/2022 PCP: Tally Joe, MD   Date of Service: the patient was seen and examined on 03/24/2022  Brief Narrative:  88 year old male with past medical history of hypertension, benign prostatic hyperplasia who presented to [facility] emergency department with melena, hematemesis, epigastric pain and generalized weakness.  Upon further investigation it was determined the patient has been taking large doses of NSAIDs for left hip pain for period time leading up to his presentation.  Upon evaluation in the emergency department patient was felt to be suffering from a likely upper gastrointestinal bleed, possibly secondary to NSAID use.  Hospice group was then called to assess the patient for admission to the hospital.  Patient was initially admitted to the stepdown unit and initiated on intravenous proton pump inhibitor.  Dr. Dulce Sellar with [facility] gastroenterology was consulted with initial recommended conservative management.  Over the course of the hospitalization patient began to experience multiple maroon-colored stools with a precipitous drop in hemoglobin to 6.8, down from a baseline of 14.5.  3 unit packed red blood cell transfusion was administered.  Patient underwent upper endoscopy on 11/21 revealing nonbleeding 2 duodenal ulcers.  Gastroenterology recommended a 6-week course of Protonix by mouth twice daily followed by 40 mg by mouth indefinitely.     Assessment and Plan: Problem  Acute Upper Gastrointestinal Bleeding  Hemoglobin increased appropriately status post 3 units of blood transfused on 11/20. Hemoglobin and hematocrit remained stable today. Upper endoscopy performed by Dr. Dulce Sellar earlier today revealing 2 duodenal ulcers that are currently not bleeding. Gastrointestinal bleed thought to be secondary to NSAID overuse over the past several months. Dr. Dulce Sellar recommends transitioning patient to  Protonix 40 mg by mouth twice daily for 6 weeks followed by 40 mg by mouth once daily indefinitely. Monitoring overnight for any further evidence of bleeding Patient can likely be discharged in the morning if stable  Osteoarthritis of the left hip  Patient reports that he heavily used NSAIDs over the past several months due to severe chronic osteoarthritic pain of the left hip Patient has followed up with Dr. Darrelyn Hillock with [facility] in the outpatient setting and has already undergone 3 separate joint injections in the past Case discussed with both Dr. Darrelyn Hillock today as well as Irish Elders the on-call [facility] PA.  Orthopedics will come by and perform a joint injection on the patient in the morning of 11/22 prior to expected discharge.    Leukocytosis  Likely stress-induced Resolved   Essential Hypertension  Normotensive despite not being on any antihypertensive therapy As needed intravenous antihypertensives for markedly elevated blood pressure   Macrocytosis  Vitamin B12 and folate unremarkable   Thrombocytopenia (Hcc)  Downtrending slowly Continuing to monitor with serial CBCs   Benign Prostatic Hyperplasia Without Urinary Obstruction  Continue home regimen of Flomax        Subjective:  Patient reports a dramatic increase in strength and energy compared to yesterday.  Patient denies any associated abdominal pain.  Patient denies any further episodes of bloody stools.    Physical Exam:  Vitals:   03/24/22 1103 03/24/22 1149 03/24/22 1220 03/24/22 2022  BP: (!) 144/69 (!) 146/76  (!) 146/74  Pulse: 74 (!) 54  87  Resp: (!) 21 20 (!) 21 16  Temp:  97.8 F (36.6 C)  98.9 F (37.2 C)  TempSrc:  Oral  Oral  SpO2: 100% (!) 77% 99% 98%  Weight:  Height:        Constitutional: Awake alert and oriented x3, no associated distress.   Skin: Improved skin pallor.  Numerous scaly lesions on the scalp and face, possible actinic keratosis.  Poor skin  turgor. Eyes: Pupils are equally reactive to light.  Increased conjunctival pallor without any evidence of scleral icterus. ENMT: Moist mucous membranes noted.  Posterior pharynx clear of any exudate or lesions.   Respiratory: clear to auscultation bilaterally, no wheezing, no crackles. Normal respiratory effort. No accessory muscle use.  Cardiovascular: Regular rate and rhythm, no murmurs / rubs / gallops. No extremity edema. 2+ pedal pulses. No carotid bruits.  Abdomen: Hyperactive bowel sounds, abdomen is soft and nontender.  No evidence of intra-abdominal masses.   Musculoskeletal: No joint deformity upper and lower extremities. Good ROM, no contractures. Normal muscle tone.    Data Reviewed:  I have personally reviewed and interpreted labs, imaging.  Significant findings are   CBC: Recent Labs  Lab 03/22/22 0252 03/22/22 0856 03/23/22 0510 03/23/22 1936 03/24/22 1218  WBC 9.3 9.0 7.1 8.2 7.6  NEUTROABS  --   --  4.2  --  5.1  HGB 7.8* 7.7* 6.8* 9.4* 10.1*  HCT 24.0* 24.1* 21.0* 28.5* 30.8*  MCV 108.1* 108.1* 108.8* 102.9* 104.4*  PLT 131* 116* 109* 106* 125*   Basic Metabolic Panel: Recent Labs  Lab 03/21/22 1008 03/22/22 0252 03/23/22 0510 03/24/22 1218  NA 136 138 134* 136  K 4.1 4.4 4.0 4.1  CL 107 110 107 107  CO2 22 24 23 24   GLUCOSE 156* 114* 115* 103*  BUN 43* 37* 28* 20  CREATININE 1.14 1.07 1.06 0.89  CALCIUM 8.7* 8.4* 8.0* 8.4*  MG 2.3  --  2.2 2.3  PHOS 3.4  --   --   --    GFR: Estimated Creatinine Clearance: 51.9 mL/min (by C-G formula based on SCr of 0.89 mg/dL). Liver Function Tests: Recent Labs  Lab 03/21/22 1008 03/22/22 0252 03/23/22 0510 03/24/22 1218  AST 28 30 33 41  ALT 17 16 14 19   ALKPHOS 40 38 46 50  BILITOT 0.7 0.8 0.9 1.7*  PROT 5.9* 5.0* 4.7* 6.0*  ALBUMIN 3.2* 2.8* 2.6* 3.2*    Coagulation Profile: Recent Labs  Lab 03/21/22 1018  INR 1.1     EKG/Telemetry: Personally reviewed.  Rhythm is normal sinus rhythm  with heart rate of 90 bpm.  No dynamic ST segment changes appreciated.   Code Status:  Full code.  Code status decision has been confirmed with: patient Family Communication: Case discussed with patient's niece who is the patient's decision maker via phone conversation.   Severity of Illness:  The appropriate patient status for this patient is INPATIENT. Inpatient status is judged to be reasonable and necessary in order to provide the required intensity of service to ensure the patient's safety. The patient's presenting symptoms, physical exam findings, and initial radiographic and laboratory data in the context of their chronic comorbidities is felt to place them at high risk for further clinical deterioration. Furthermore, it is not anticipated that the patient will be medically stable for discharge from the hospital within 2 midnights of admission.   * I certify that at the point of admission it is my clinical judgment that the patient will require inpatient hospital care spanning beyond 2 midnights from the point of admission due to high intensity of service, high risk for further deterioration and high frequency of surveillance required.*  Time spent:  45 minutes  Author:  Marinda Elk MD  03/24/2022 9:49 PM

## 2022-03-25 ENCOUNTER — Other Ambulatory Visit (HOSPITAL_COMMUNITY): Payer: Self-pay

## 2022-03-25 DIAGNOSIS — K922 Gastrointestinal hemorrhage, unspecified: Secondary | ICD-10-CM | POA: Diagnosis not present

## 2022-03-25 DIAGNOSIS — N400 Enlarged prostate without lower urinary tract symptoms: Secondary | ICD-10-CM | POA: Diagnosis not present

## 2022-03-25 DIAGNOSIS — K269 Duodenal ulcer, unspecified as acute or chronic, without hemorrhage or perforation: Secondary | ICD-10-CM

## 2022-03-25 DIAGNOSIS — I10 Essential (primary) hypertension: Secondary | ICD-10-CM | POA: Diagnosis not present

## 2022-03-25 DIAGNOSIS — M1612 Unilateral primary osteoarthritis, left hip: Secondary | ICD-10-CM

## 2022-03-25 DIAGNOSIS — Z8719 Personal history of other diseases of the digestive system: Secondary | ICD-10-CM

## 2022-03-25 DIAGNOSIS — D72829 Elevated white blood cell count, unspecified: Secondary | ICD-10-CM | POA: Diagnosis not present

## 2022-03-25 LAB — COMPREHENSIVE METABOLIC PANEL
ALT: 21 U/L (ref 0–44)
AST: 45 U/L — ABNORMAL HIGH (ref 15–41)
Albumin: 2.8 g/dL — ABNORMAL LOW (ref 3.5–5.0)
Alkaline Phosphatase: 46 U/L (ref 38–126)
Anion gap: 4 — ABNORMAL LOW (ref 5–15)
BUN: 20 mg/dL (ref 8–23)
CO2: 26 mmol/L (ref 22–32)
Calcium: 8.5 mg/dL — ABNORMAL LOW (ref 8.9–10.3)
Chloride: 109 mmol/L (ref 98–111)
Creatinine, Ser: 0.87 mg/dL (ref 0.61–1.24)
GFR, Estimated: >60 mL/min (ref >60)
Glucose, Bld: 112 mg/dL — ABNORMAL HIGH (ref 70–99)
Potassium: 4.1 mmol/L (ref 3.5–5.1)
Sodium: 139 mmol/L (ref 135–145)
Total Bilirubin: 1.3 mg/dL — ABNORMAL HIGH (ref 0.3–1.2)
Total Protein: 5.4 g/dL — ABNORMAL LOW (ref 6.5–8.1)

## 2022-03-25 LAB — CBC WITH DIFFERENTIAL/PLATELET
Abs Immature Granulocytes: 0.03 10*3/uL (ref 0.00–0.07)
Basophils Absolute: 0 10*3/uL (ref 0.0–0.1)
Basophils Relative: 0 %
Eosinophils Absolute: 0 10*3/uL (ref 0.0–0.5)
Eosinophils Relative: 1 %
HCT: 28.5 % — ABNORMAL LOW (ref 39.0–52.0)
Hemoglobin: 9.3 g/dL — ABNORMAL LOW (ref 13.0–17.0)
Immature Granulocytes: 1 %
Lymphocytes Relative: 22 %
Lymphs Abs: 1.5 10*3/uL (ref 0.7–4.0)
MCH: 33.8 pg (ref 26.0–34.0)
MCHC: 32.6 g/dL (ref 30.0–36.0)
MCV: 103.6 fL — ABNORMAL HIGH (ref 80.0–100.0)
Monocytes Absolute: 0.8 10*3/uL (ref 0.1–1.0)
Monocytes Relative: 12 %
Neutro Abs: 4.3 10*3/uL (ref 1.7–7.7)
Neutrophils Relative %: 64 %
Platelets: 134 10*3/uL — ABNORMAL LOW (ref 150–400)
RBC: 2.75 MIL/uL — ABNORMAL LOW (ref 4.22–5.81)
RDW: 15.3 % (ref 11.5–15.5)
WBC: 6.6 10*3/uL (ref 4.0–10.5)
nRBC: 0 % (ref 0.0–0.2)

## 2022-03-25 LAB — MAGNESIUM: Magnesium: 2.4 mg/dL (ref 1.7–2.4)

## 2022-03-25 MED ORDER — PANTOPRAZOLE SODIUM 40 MG PO TBEC
DELAYED_RELEASE_TABLET | ORAL | 1 refills | Status: DC
  Filled 2022-03-25: qty 126, 84d supply, fill #0

## 2022-03-25 MED ORDER — FUROSEMIDE 20 MG PO TABS: 20.0000 mg | ORAL_TABLET | Freq: Every day | ORAL | Status: DC | PRN

## 2022-03-25 MED ORDER — METHYLPREDNISOLONE ACETATE 40 MG/ML IJ SUSP
40.0000 mg | Freq: Once | INTRAMUSCULAR | Status: AC
  Administered 2022-03-25: 40 mg via INTRA_ARTICULAR
  Filled 2022-03-25: qty 1

## 2022-03-25 MED ORDER — ACETAMINOPHEN 325 MG PO TABS: 325.0000 mg | ORAL_TABLET | Freq: Four times a day (QID) | ORAL | Status: AC | PRN

## 2022-03-25 MED ORDER — SENNOSIDES-DOCUSATE SODIUM 8.6-50 MG PO TABS
1.0000 | ORAL_TABLET | Freq: Two times a day (BID) | ORAL | 1 refills | Status: DC
  Filled 2022-03-25 (×2): qty 60, 30d supply, fill #0

## 2022-03-25 MED ORDER — POLYETHYLENE GLYCOL 3350 17 G PO PACK
17.0000 g | PACK | Freq: Every day | ORAL | 0 refills | Status: DC | PRN
  Filled 2022-03-25 (×2): qty 30, 30d supply, fill #0

## 2022-03-25 MED ORDER — POLYETHYLENE GLYCOL 3350 17 G PO PACK: 17.0000 g | PACK | Freq: Every day | ORAL | Status: DC

## 2022-03-25 MED ORDER — LIDOCAINE HCL (CARDIAC) PF 100 MG/5ML IV SOSY
PREFILLED_SYRINGE | INTRAVENOUS | Status: AC
  Filled 2022-03-25: qty 5

## 2022-03-25 MED ORDER — SENNOSIDES-DOCUSATE SODIUM 8.6-50 MG PO TABS: 1.0000 | ORAL_TABLET | Freq: Two times a day (BID) | ORAL | Status: DC

## 2022-03-25 MED ORDER — AMLODIPINE BESYLATE 5 MG PO TABS
2.5000 mg | ORAL_TABLET | Freq: Every morning | ORAL | Status: DC
  Administered 2022-03-25: 2.5 mg via ORAL
  Filled 2022-03-25: qty 1

## 2022-03-25 MED ORDER — LOSARTAN POTASSIUM 100 MG PO TABS: 100.0000 mg | ORAL_TABLET | Freq: Every morning | ORAL | Status: DC

## 2022-03-25 MED ORDER — LIDOCAINE HCL (PF) 1 % IJ SOLN
2.0000 mL | Freq: Once | INTRAMUSCULAR | Status: AC
  Administered 2022-03-25: 2 mL
  Filled 2022-03-25: qty 5

## 2022-03-25 NOTE — Discharge Summary (Addendum)
Physician Discharge Summary  Alexander Rich [id number] DOB: Nov 21, 1925 DOA: 03/21/2022  PCP: Antony Contras, MD  Admit date: 03/21/2022 Discharge date: 03/25/2022  Time spent: 60 minutes  Recommendations for Outpatient Follow-up:  Follow-up with Dr Therisa Doyne, gastroenterology in 2 to 3 weeks. Follow-up with Antony Contras, MD in 1 week.  On follow-up patient need a basic metabolic profile done to follow-up on electrolytes and renal function.  Patient will need a CBC done to follow-up on counts.  Patient's blood pressure need to be reassessed on follow-up.  Patient's constipation will need to be followed up upon. Follow-up with Dr. Gladstone Lighter, orthopedics as scheduled 03/31/2022.   Discharge Diagnoses:  Principal Problem:   Acute upper gastrointestinal bleeding Active Problems:   Leukocytosis   Primary localized osteoarthritis of left hip   Essential hypertension   Macrocytosis   Thrombocytopenia (HCC)   Benign prostatic hyperplasia without urinary obstruction   Sinus bradycardia   Duodenal ulcer   Discharge Condition: Stable and improved.  Diet recommendation: Heart healthy  Filed Weights   03/21/22 0945 03/21/22 1450  Weight: 81.6 kg 78.6 kg    History of present illness:  HPI per Dr. Rubin Payor is a 88 y.o. male with medical history significant of constipation, osteoarthritis of the hips, hypertension, overweight, hyperpigmented macules who is coming to the emergency department with complaints of multiple episodes of melena over the last 3 to 4 days and 2 episodes of hematemesis earlier today associated with mild epigastric discomfort and generalized weakness.  The patient stated he was constipated 4 days ago and used an enema bag.  After that he noticed that he was having melena.  He has been using ibuprofen and Aleve over the last few months for arthritis.  Most recently he has been using ibuprofen liquid capsules and was taking up to 12 oh then a day. He denied  fever, chills, rhinorrhea, sore throat, wheezing or hemoptysis.  No chest pain, palpitations, diaphoresis, PND, orthopnea or pitting edema of the lower extremities. No flank pain, dysuria, frequency or hematuria.  No polyuria, polydipsia, polyphagia or blurred vision.    The patient stated that he has not been drinking for over 2 years.  He stated that he was drinking for several years after his wife passed away, but has not had alcohol recently.   ED course: Initial vital signs were temperature 97.9 F, pulse 111, respirations 16, BP 99/66 mmHg and O2 sat 100% on room air.  The patient received pantoprazole 40 mg IVP x1.   Lab work: Fecal occult blood was positive.  CBC showed white count of 14.4, hemoglobin 9.2 g/dL with an MCV of 109.4 fL and platelets 141.  Normal PT and INR.  CMP with normal electrolytes after calcium correction.  Glucose 156, BUN 43 and creatinine 1.14 mg deciliter.  Total protein is 5.9 and albumin 3.2 g/dL.  The rest of the LFTs were normal.  Hospital Course:  #1 acute upper GI bleed/duodenal ulcers -Patient admitted with findings of upper GI bleed with melanotic stools, FOBT noted to be positive. -Hemoglobin noted to drop as low as 6.8 during the hospitalization, patient received 2 units packed red blood cells with hemoglobin stabilizing at 9.3. -Patient initially placed on a Protonix drip subsequently placed on IV PPI. -GI consulted and patient seen in consultation by Dr. Paulita Fujita -Patient subsequently underwent upper endoscopy which showed 1 nonbleeding abnormal ulcer in the duodenal bulb largest dimension of 5 mm, 1 nonbleeding cratered duodenal ulcer with no stigmata  of bleeding noted in the first portion of the duodenum largest diameter 8 mm. -Patient improved clinically, bleeding subsided. -GI recommended 6 weeks of PPI twice daily and subsequently PPI daily indefinitely.  Patient advised to avoid all NSAIDs. -Outpatient follow-up with GI, Dr. Abran Richard and outpatient  follow-up with PCP. -Patient was discharged in stable and improved condition.  2.  Osteoarthritis of the left hip -Patient noted to have reported heavily use NSAIDs over the past several months due to severe chronic osteoarthritic pain of the left hip. -Patient noted to be followed by Dr.Gioffre with [facility] in the outpatient setting noted to have undergone 3 separate joint injections in the past. -Dr. Cyd Silence discussed case with Dr Gladstone Lighter as well as Dierdre Highman, on-call [facility] PA who came by and patient received a joint injection on the morning of discharge 03/25/2022. -Patient stated had Percocet at home which he uses which helps his pain however developed constipation and as such hesitant to use it. -Patient will be discharged on Senokot-S twice daily as well as MiraLAX as needed to help prevent constipation. -Outpatient follow-up with orthopedics as scheduled.  3.  Leukocytosis -Likely reactive leukocytosis, resolved by day of discharge.  4.  Hypertension -Normotensive. -Patient noted to have been on Norvasc and Cozaar prior to admission which were held initially during the hospitalization. -Norvasc resumed, Cozaar will be resumed 1 week postdischarge. -Outpatient follow-up with PCP.  5.  Macrocytosis -Vitamin B12 and folate noted to be unremarkable. -Outpatient follow-up.  6.  Thrombocytopenia -Initially noted to be dropping during the hospitalization however improved and was up to 134 by day of discharge. -Outpatient follow-up.  7.  BPH -Patient maintained on home regimen Flomax.  Procedures: Upper endoscopy 03/24/2022 Transfusion 2 units packed red blood cells 03/23/2022  Consultations: Gastroenterology: Dr. Paulita Fujita 03/21/2022 Orthopedics: Lacie Draft, PA 03/25/2022  Discharge Exam: Vitals:   03/25/22 0605 03/25/22 1331  BP: (!) 149/71 127/77  Pulse: 80 95  Resp: 18 20  Temp: 98 F (36.7 C) 98.4 F (36.9 C)  SpO2:  98%    General:  NAD Cardiovascular: Regular rate and rhythm no murmurs rubs or gallops.  No JVD.  1+ bilateral pedal edema. Respiratory: Lungs clear to auscultation bilaterally.  No wheezes, no crackles, no rhonchi.  Fair air movement.  Speaking in full sentences.  Discharge Instructions   Discharge Instructions     Diet - low sodium heart healthy   Complete by: As directed    Discharge instructions   Complete by: As directed    Please do not use any aspirin, no ibuprofen, no naproxen, no Goody's powder, no NSAIDs forever/indefinitely.   Increase activity slowly   Complete by: As directed    Increase activity slowly   Complete by: As directed       Allergies as of 03/25/2022   No Known Allergies      Medication List     STOP taking these medications    Advil Liqui-Gels minis 200 MG Caps Generic drug: Ibuprofen       TAKE these medications    acetaminophen 325 MG tablet Commonly known as: TYLENOL Take 1 tablet (325 mg total) by mouth every 6 (six) hours as needed for mild pain (or Fever >/= 101).   amLODipine 2.5 MG tablet Commonly known as: NORVASC Take 2.5 mg by mouth in the morning.   ASCORBIC ACID PO Take 1 tablet by mouth in the morning. Vitamin C, unknown strength   furosemide 20 MG tablet Commonly known as: LASIX  Take 1 tablet (20 mg total) by mouth daily as needed for fluid or edema.   Linzess 145 MCG Caps capsule Generic drug: linaclotide Take 145 mcg by mouth daily before breakfast.   losartan 100 MG tablet Commonly known as: COZAAR Take 1 tablet (100 mg total) by mouth in the morning. Start taking on: March 30, 2022 What changed: These instructions start on March 30, 2022. If you are unsure what to do until then, ask your doctor or other care provider.   oxyCODONE-acetaminophen 5-325 MG tablet Commonly known as: PERCOCET/ROXICET Take 2 tablets by mouth every 6 (six) hours as needed for pain.   pantoprazole 40 MG tablet Commonly known as:  PROTONIX Take 1 tablet (40 mg total) by mouth 2 (two) times daily before a meal for 42 days, THEN 1 tablet (40 mg total) daily. Start taking on: March 25, 2022   polyethylene glycol 17 g packet Commonly known as: MIRALAX / GLYCOLAX Take 17 g by mouth daily as needed. Hold for diarrhea. Take when taking percocet   senna-docusate 8.6-50 MG tablet Commonly known as: Senokot-S Take 1 tablet by mouth 2 (two) times daily.   SYSTANE OP Place 1 drop into both eyes 4 (four) times daily as needed (dry eyes).   tamsulosin 0.4 MG Caps capsule Commonly known as: FLOMAX Take 0.4 mg by mouth in the morning.   ZINC PO Take 1 tablet by mouth in the morning. Zinc supplement, unknown strength.       No Known Allergies  Follow-up Information     Ranee Gosselin, MD. Go on 03/31/2022.   Specialty: Orthopedic Surgery Contact information: [address] [place] [address] [phone]         Tally Joe, MD Follow up in 1 week(s).   Specialty: Family Medicine Contact information: [phone] [place] [address] [phone]         Kerin Salen, MD. Schedule an appointment as soon as possible for a visit in 2 week(s).   Specialty: Gastroenterology Why: f/u in 2-3 weeks. Contact information: [address] [place] [address] [phone]                  The results of significant diagnostics from this hospitalization (including imaging, microbiology, ancillary and laboratory) are listed below for reference.    Significant Diagnostic Studies: No results found.  Microbiology: Recent Results (from the past 240 hour(s))  MRSA Next Gen by PCR, Nasal     Status: None   Collection Time: 03/21/22  2:45 PM   Specimen: Nasal Mucosa; Nasal Swab  Result Value Ref Range Status   MRSA by PCR Next Gen NOT DETECTED NOT DETECTED Final    Comment: (NOTE) The GeneXpert MRSA Assay (FDA approved for NASAL specimens only), is  one component of a comprehensive MRSA colonization surveillance program. It is not intended to diagnose MRSA infection nor to guide or monitor treatment for MRSA infections. Test performance is not FDA approved in patients less than 27 years old. Performed at [facility], [address], [place], [place] [address]      Labs: Basic Metabolic Panel: Recent Labs  Lab 03/21/22 1008 03/22/22 0252 03/23/22 0510 03/24/22 1218 03/25/22 0451  NA 136 138 134* 136 139  K 4.1 4.4 4.0 4.1 4.1  CL 107 110 107 107 109  CO2 22 24 23 24 26   GLUCOSE 156* 114* 115* 103* 112*  BUN 43* 37* 28* 20 20  CREATININE 1.14 1.07 1.06 0.89 0.87  CALCIUM 8.7* 8.4* 8.0* 8.4* 8.5*  MG 2.3  --  2.2 2.3 2.4  PHOS 3.4  --   --   --   --    Liver Function Tests: Recent Labs  Lab 03/21/22 1008 03/22/22 0252 03/23/22 0510 03/24/22 1218 03/25/22 0451  AST 28 30 33 41 45*  ALT 17 16 14 19 21   ALKPHOS 40 38 46 50 46  BILITOT 0.7 0.8 0.9 1.7* 1.3*  PROT 5.9* 5.0* 4.7* 6.0* 5.4*  ALBUMIN 3.2* 2.8* 2.6* 3.2* 2.8*   No results for input(s): "LIPASE", "AMYLASE" in the last 168 hours. No results for input(s): "AMMONIA" in the last 168 hours. CBC: Recent Labs  Lab 03/22/22 0856 03/23/22 0510 03/23/22 1936 03/24/22 1218 03/25/22 0451  WBC 9.0 7.1 8.2 7.6 6.6  NEUTROABS  --  4.2  --  5.1 4.3  HGB 7.7* 6.8* 9.4* 10.1* 9.3*  HCT 24.1* 21.0* 28.5* 30.8* 28.5*  MCV 108.1* 108.8* 102.9* 104.4* 103.6*  PLT 116* 109* 106* 125* 134*   Cardiac Enzymes: No results for input(s): "CKTOTAL", "CKMB", "CKMBINDEX", "TROPONINI" in the last 168 hours. BNP: BNP (last 3 results) No results for input(s): "BNP" in the last 8760 hours.  ProBNP (last 3 results) No results for input(s): "PROBNP" in the last 8760 hours.  CBG: No results for input(s): "GLUCAP" in the last 168 hours.     Signed:  Irine Seal MD.  [facility] 03/25/2022, 4:10 PM

## 2022-03-25 NOTE — Anesthesia Postprocedure Evaluation (Signed)
Anesthesia Post Note  Patient: Alexander Rich  Procedure(s) Performed: ESOPHAGOGASTRODUODENOSCOPY (EGD)     Patient location during evaluation: PACU Anesthesia Type: MAC Level of consciousness: awake and alert Pain management: pain level controlled Vital Signs Assessment: post-procedure vital signs reviewed and stable Respiratory status: spontaneous breathing Cardiovascular status: stable Anesthetic complications: no   No notable events documented.  Last Vitals:  Vitals:   03/24/22 2022 03/25/22 0605  BP: (!) 146/74 (!) 149/71  Pulse: 87 80  Resp: 16 18  Temp: 37.2 C 36.7 C  SpO2: 98%     Last Pain:  Vitals:   03/25/22 0605  TempSrc: Oral  PainSc:                  Nolon Nations

## 2022-03-25 NOTE — Evaluation (Signed)
Physical Therapy Evaluation Patient Details Name: Alexander Rich MRN: [id number] DOB: [birth year] Today's Date: 03/25/2022  History of Present Illness  Patient is a 88 year old who presented with epigastric pain, melena and hematemesis. Patient was found to have acute upper GI bleed, osteoarthritis of left hip, leukocytosis. Patient underwent EGD on 11/21. Noted to have cortisone shot in L hip this afternoon with ortho recommending WBAT.  PMH: HTN, benign prostatic hyperplasia, back surgery  Clinical Impression  Patient is a 88 year old male who was admitted for above and presents with functional mobility limitations 2* generalized weakness, decreased endurance and ambulatory balance deficits. Patient was living at home alone with PRN support from niece.  Patient will need increased support at time of d/c for medication management, and safety at home. Patient reported that he was not taking his prescribed medications daily at home and that he was taking aleve for pain without consulting the recommendations on bottle  prior to hospitalization. Patient would benefit from [facility] services in next level of care in addition to increased family support. Patient would be high risk for rehospitalization without continued interventions for medication management. Patient would continue to benefit from skilled PT services at this time while admitted and after d/c to address noted deficits in order to improve overall safety and independence.     Recommendations for follow up therapy are one component of a multi-disciplinary discharge planning process, led by the attending physician.  Recommendations may be updated based on patient status, additional functional criteria and insurance authorization.  Follow Up Recommendations Home health PT      Assistance Recommended at Discharge PRN  Patient can return home with the following  A little help with bathing/dressing/bathroom;A little help with walking and/or  transfers;Assist for transportation;Help with stairs or ramp for entrance;Assistance with cooking/housework    Equipment Recommendations None recommended by PT  Recommendations for Other Services       Functional Status Assessment Patient has had a recent decline in their functional status and demonstrates the ability to make significant improvements in function in a reasonable and predictable amount of time.     Precautions / Restrictions Precautions Precaution Comments: monitor HR Restrictions Weight Bearing Restrictions: No      Mobility  Bed Mobility               General bed mobility comments: up in recliner and in bathroom at end of session    Transfers Overall transfer level: Needs assistance Equipment used: Rolling walker (2 wheels) Transfers: Sit to/from Stand Sit to Stand: Modified independent (Device/Increase time)                Ambulation/Gait Ambulation/Gait assistance: Supervision Gait Distance (Feet): 250 Feet Assistive device: Rolling walker (2 wheels) Gait Pattern/deviations: Step-to pattern, Step-through pattern, Shuffle, Trunk flexed       General Gait Details: cues for posture and positon from RW;  Occasional steady assist for safety on turns and manouvering around Engineer, manufacturing systems    Modified Rankin (Stroke Patients Only)       Balance Overall balance assessment: Mild deficits observed, not formally tested                                           Pertinent Vitals/Pain Pain Assessment Pain Assessment: No/denies pain  Home Living Family/patient expects to be discharged to:: Private residence Living Arrangements: Alone Available Help at Discharge: Family;Available PRN/intermittently Type of Home: House Home Access: Stairs to enter Entrance Stairs-Rails: Right Entrance Stairs-Number of Steps: 3-4   Home Layout: One level Home Equipment: Rollator (4  wheels);Rolling Walker (2 wheels) Additional Comments: RW one in house and one in car,    Prior Function Prior Level of Function : Independent/Modified Independent             Mobility Comments: recently using RW for safety ADLs Comments: patient was doing own grocery shopping.     Hand Dominance        Extremity/Trunk Assessment   Upper Extremity Assessment Upper Extremity Assessment: Overall WFL for tasks assessed    Lower Extremity Assessment Lower Extremity Assessment: Generalized weakness    Cervical / Trunk Assessment Cervical / Trunk Assessment: Kyphotic  Communication   Communication: No difficulties  Cognition Arousal/Alertness: Awake/alert Behavior During Therapy: WFL for tasks assessed/performed Overall Cognitive Status: Within Functional Limits for tasks assessed                                          General Comments      Exercises     Assessment/Plan    PT Assessment Patient needs continued PT services  PT Problem List Decreased strength;Decreased balance;Decreased mobility       PT Treatment Interventions DME instruction;Gait training;Stair training;Functional mobility training;Therapeutic activities;Therapeutic exercise;Patient/family education;Balance training    PT Goals (Current goals can be found in the Care Plan section)  Acute Rehab PT Goals Patient Stated Goal: Regain IND PT Goal Formulation: With patient Time For Goal Achievement: 04/08/22 Potential to Achieve Goals: Good    Frequency Min 3X/week     Co-evaluation               AM-PAC PT "6 Clicks" Mobility  Outcome Measure Help needed turning from your back to your side while in a flat bed without using bedrails?: None Help needed moving from lying on your back to sitting on the side of a flat bed without using bedrails?: None Help needed moving to and from a bed to a chair (including a wheelchair)?: A Little Help needed standing up from a chair  using your arms (e.g., wheelchair or bedside chair)?: A Little Help needed to walk in hospital room?: A Little Help needed climbing 3-5 steps with a railing? : A Lot 6 Click Score: 19    End of Session Equipment Utilized During Treatment: Gait belt Activity Tolerance: Patient tolerated treatment well Patient left: Other (comment) (bathroom) Nurse Communication: Mobility status PT Visit Diagnosis: Difficulty in walking, not elsewhere classified (R26.2);Unsteadiness on feet (R26.81)    Time: 4481-8563 PT Time Calculation (min) (ACUTE ONLY): 18 min   Charges:   PT Evaluation $PT Eval Low Complexity: 1 Low          Mauro Kaufmann PT Acute Rehabilitation Services Pager [phone] Office [phone]   Rande Roylance 03/25/2022, 4:30 PM

## 2022-03-25 NOTE — TOC Progression Note (Signed)
Transition of Care [facility]) - Progression Note    Patient Details  Name: Alexander Rich MRN: [id number] Date of Birth: [birth year]  Transition of [facility]) CM/SW Contact  Lavenia Atlas, RN Phone Number: 03/25/2022, 4:44 PM  Clinical Narrative:   Received secure chat from Diane RN, regarding HHPT/OT services. This RNCM spoke with patient who reports he prefers a male physical therapist and will accept any HH agency that accepts his insurance. Notified Calvin with [facility] who will follow for HHPT/OT and patient requesting a male therapist.   No additional TOC needs     Expected Discharge Plan: Home w Home Health Services Barriers to Discharge: No Barriers Identified  Expected Discharge Plan and Services Expected Discharge Plan: Home w Home Health Services In-house Referral: NA Discharge Planning Services: CM Consult Post Acute Care Choice: Home Health Living arrangements for the past 2 months: Single Family Home Expected Discharge Date: 03/25/22               DME Arranged: N/A DME Agency: NA       HH Arranged: PT, OT HH Agency: [facility] Date HH Agency Contacted: 03/25/22 Time HH Agency Contacted: 1643 Representative spoke with at [facility] Agency: Jerilynn Som   Social Determinants of Health (SDOH) Interventions    Readmission Risk Interventions     No data to display

## 2022-03-25 NOTE — Evaluation (Signed)
Occupational Therapy Evaluation Patient Details Name: Alexander Rich MRN: [id number] DOB: Mar 21, 1926 Today's Date: 03/25/2022   History of Present Illness Patient is a 88 year old who presented with epigastric pain, melena and hematemesis. Patient was found to have acute upper GI bleed, osteoarthritis of left hip, leukocytosis. Patient underwent EGD on 11/21. Noted to have cortisone shot in L hip this afternoon with ortho recommending WBAT.  PMH: HTN, benign prostatic hyperplasia, back surgery   Clinical Impression   Patient is a 88 year old male who was admitted for above. Patient was living at home alone with PRN support from niece. Patient was noted to be min guard for ADL tasks on this date with increased time needed. Patients HR was noted to increase to 149 bpm during tasks with nurse made aware. Patient will need increased support at time of d/c for medication management, and safety at home. Patient reported that he was not taking his prescribed medications daily at home and that he was taking aleve for pain without consulting the recommendations on bottle  prior to hospitalization. Patient would benefit from [facility] services in next level of care in addition to increased family support. Patient would be high risk for rehospitalization without continued interventions for medication management. Patient would continue to benefit from skilled OT services at this time while admitted and after d/c to address noted deficits in order to improve overall safety and independence in ADLs.       Recommendations for follow up therapy are one component of a multi-disciplinary discharge planning process, led by the attending physician.  Recommendations may be updated based on patient status, additional functional criteria and insurance authorization.   Follow Up Recommendations  Home health OT     Assistance Recommended at Discharge Intermittent Supervision/Assistance  Patient can return home with the  following Assistance with cooking/housework;Assist for transportation;Direct supervision/assist for financial management;Direct supervision/assist for medications management    Functional Status Assessment  Patient has had a recent decline in their functional status and demonstrates the ability to make significant improvements in function in a reasonable and predictable amount of time.  Equipment Recommendations       Recommendations for Other Services       Precautions / Restrictions Precautions Precaution Comments: monitor HR Restrictions Weight Bearing Restrictions: No      Mobility Bed Mobility               General bed mobility comments: up in recliner and returned to the same.    Transfers                          Balance Overall balance assessment: Mild deficits observed, not formally tested                                         ADL either performed or assessed with clinical judgement   ADL Overall ADL's : Needs assistance/impaired Eating/Feeding: Set up;Sitting   Grooming: Set up;Standing   Upper Body Bathing: Set up;Sitting   Lower Body Bathing: Set up;Sitting/lateral leans Lower Body Bathing Details (indicate cue type and reason): noted increased edmea in BLE and difficulty with figure four positioning on LLE while sitting.                             Vision Patient  Visual Report: No change from baseline       Perception     Praxis      Pertinent Vitals/Pain Pain Assessment Pain Assessment: No/denies pain (recent cortisone injection in L hip)     Hand Dominance     Extremity/Trunk Assessment Upper Extremity Assessment Upper Extremity Assessment: Overall WFL for tasks assessed   Lower Extremity Assessment Lower Extremity Assessment: Defer to PT evaluation   Cervical / Trunk Assessment Cervical / Trunk Assessment: Normal   Communication Communication Communication: No difficulties    Cognition Arousal/Alertness: Awake/alert Behavior During Therapy: WFL for tasks assessed/performed Overall Cognitive Status: Within Functional Limits for tasks assessed                                       General Comments       Exercises     Shoulder Instructions      Home Living Family/patient expects to be discharged to:: Private residence Living Arrangements: Alone Available Help at Discharge: Family;Available PRN/intermittently (niece lives near by) Type of Home: House Home Access: Stairs to enter Secretary/administrator of Steps: 3-4 Entrance Stairs-Rails: Right Home Layout: One level     Bathroom Shower/Tub: Tub/shower unit         Home Equipment: Agricultural consultant (2 wheels)   Additional Comments: RW one in house and one in car,      Prior Functioning/Environment Prior Level of Function : Independent/Modified Independent               ADLs Comments: patient was doing own grocery shopping.        OT Problem List:        OT Treatment/Interventions: Self-care/ADL training;Therapeutic exercise;Therapeutic activities;Neuromuscular education;Energy conservation;DME and/or AE instruction;Balance training;Patient/family education    OT Goals(Current goals can be found in the care plan section) Acute Rehab OT Goals Patient Stated Goal: to get better OT Goal Formulation: With patient Time For Goal Achievement: 04/08/22 Potential to Achieve Goals: Fair  OT Frequency: Min 2X/week    Co-evaluation              AM-PAC OT "6 Clicks" Daily Activity     Outcome Measure Help from another person eating meals?: None Help from another person taking care of personal grooming?: A Little Help from another person toileting, which includes using toliet, bedpan, or urinal?: A Little Help from another person bathing (including washing, rinsing, drying)?: A Little Help from another person to put on and taking off regular upper body clothing?: A  Little Help from another person to put on and taking off regular lower body clothing?: A Little 6 Click Score: 19   End of Session Equipment Utilized During Treatment: Rolling walker (2 wheels) Nurse Communication: Other (comment) (HR increased to 149 bpm with mobility.)  Activity Tolerance: Patient tolerated treatment well Patient left: in chair;with call bell/phone within reach                   Time: 1126-1200 OT Time Calculation (min): 34 min Charges:  OT General Charges $OT Visit: 1 Visit OT Evaluation $OT Eval Low Complexity: 1 Low OT Treatments $Self Care/Home Management : 8-22 mins  Rosalio Loud, MS Acute Rehabilitation Department Office# [phone]   Selinda Flavin 03/25/2022, 12:20 PM

## 2022-03-25 NOTE — Consult Note (Signed)
Reason for Consult:left hip pain Referring Physician: Dr. [name] is an 88 y.o. male.  HPI: Pt with chronic hip pain, followed by Dr. Gladstone Rich outpatient. Recent admission for GI bleed. Reports lateral hip pain consistent with prior flareups which have resolved with local cortisone injections. Requesting injection today.  Past Medical History:  Diagnosis Date   Constipation    Hip pain    Hyperpigmented skin lesions 03/21/2022   Hypertension     Past Surgical History:  Procedure Laterality Date   BACK SURGERY      Family History  Problem Relation Age of Onset   Kidney disease Mother    CVA Father     Social History:  reports that he has never smoked. He has never used smokeless tobacco. He reports that he does not drink alcohol and does not use drugs.  Allergies: No Known Allergies  Medications: I have reviewed the patient's current medications.  Results for orders placed or performed during the hospital encounter of 03/21/22 (from the past 48 hour(s))  Prepare RBC (crossmatch)     Status: None   Collection Time: 03/23/22  1:17 PM  Result Value Ref Range   Order Confirmation      ORDER PROCESSED BY BLOOD BANK Performed at [facility] [address] [place], [place] [address]   CBC     Status: Abnormal   Collection Time: 03/23/22  7:36 PM  Result Value Ref Range   WBC 8.2 4.0 - 10.5 K/uL   RBC 2.77 (L) 4.22 - 5.81 MIL/uL   Hemoglobin 9.4 (L) 13.0 - 17.0 g/dL    Comment: REPEATED TO VERIFY POST TRANSFUSION SPECIMEN    HCT 28.5 (L) 39.0 - 52.0 %   MCV 102.9 (H) 80.0 - 100.0 fL   MCH 33.9 26.0 - 34.0 pg   MCHC 33.0 30.0 - 36.0 g/dL   RDW 15.9 (H) 11.5 - 15.5 %   Platelets 106 (L) 150 - 400 K/uL   nRBC 0.0 0.0 - 0.2 %    Comment: Performed at [facility], [place] [address], [place], [place] [address]  CBC with Differential/Platelet     Status: Abnormal   Collection Time: 03/24/22 12:18 PM  Result Value Ref  Range   WBC 7.6 4.0 - 10.5 K/uL   RBC 2.95 (L) 4.22 - 5.81 MIL/uL   Hemoglobin 10.1 (L) 13.0 - 17.0 g/dL   HCT 30.8 (L) 39.0 - 52.0 %   MCV 104.4 (H) 80.0 - 100.0 fL   MCH 34.2 (H) 26.0 - 34.0 pg   MCHC 32.8 30.0 - 36.0 g/dL   RDW 16.0 (H) 11.5 - 15.5 %   Platelets 125 (L) 150 - 400 K/uL   nRBC 0.0 0.0 - 0.2 %   Neutrophils Relative % 68 %   Neutro Abs 5.1 1.7 - 7.7 K/uL   Lymphocytes Relative 22 %   Lymphs Abs 1.7 0.7 - 4.0 K/uL   Monocytes Relative 10 %   Monocytes Absolute 0.7 0.1 - 1.0 K/uL   Eosinophils Relative 0 %   Eosinophils Absolute 0.0 0.0 - 0.5 K/uL   Basophils Relative 0 %   Basophils Absolute 0.0 0.0 - 0.1 K/uL   Immature Granulocytes 0 %   Abs Immature Granulocytes 0.03 0.00 - 0.07 K/uL    Comment: Performed at [facility], [place] [address], [place], [place] [address]  Comprehensive metabolic panel     Status: Abnormal   Collection Time: 03/24/22  12:18 PM  Result Value Ref Range   Sodium 136 135 - 145 mmol/L   Potassium 4.1 3.5 - 5.1 mmol/L   Chloride 107 98 - 111 mmol/L   CO2 24 22 - 32 mmol/L   Glucose, Bld 103 (H) 70 - 99 mg/dL    Comment: Glucose reference range applies only to samples taken after fasting for at least 8 hours.   BUN 20 8 - 23 mg/dL   Creatinine, Ser 0.89 0.61 - 1.24 mg/dL   Calcium 8.4 (L) 8.9 - 10.3 mg/dL   Total Protein 6.0 (L) 6.5 - 8.1 g/dL   Albumin 3.2 (L) 3.5 - 5.0 g/dL   AST 41 15 - 41 U/L   ALT 19 0 - 44 U/L   Alkaline Phosphatase 50 38 - 126 U/L   Total Bilirubin 1.7 (H) 0.3 - 1.2 mg/dL   GFR, Estimated >60 >60 mL/min    Comment: (NOTE) Calculated using the CKD-EPI Creatinine Equation (2021)    Anion gap 5 5 - 15    Comment: Performed at [facility], [place] [address], [place], [place] [address]  Magnesium     Status: None   Collection Time: 03/24/22 12:18 PM  Result Value Ref Range   Magnesium 2.3 1.7 - 2.4 mg/dL    Comment: Performed at [facility], [place]  [address] [place], [place] [address]  CBC with Differential/Platelet     Status: Abnormal   Collection Time: 03/25/22  4:51 AM  Result Value Ref Range   WBC 6.6 4.0 - 10.5 K/uL   RBC 2.75 (L) 4.22 - 5.81 MIL/uL   Hemoglobin 9.3 (L) 13.0 - 17.0 g/dL   HCT 28.5 (L) 39.0 - 52.0 %   MCV 103.6 (H) 80.0 - 100.0 fL   MCH 33.8 26.0 - 34.0 pg   MCHC 32.6 30.0 - 36.0 g/dL   RDW 15.3 11.5 - 15.5 %   Platelets 134 (L) 150 - 400 K/uL   nRBC 0.0 0.0 - 0.2 %   Neutrophils Relative % 64 %   Neutro Abs 4.3 1.7 - 7.7 K/uL   Lymphocytes Relative 22 %   Lymphs Abs 1.5 0.7 - 4.0 K/uL   Monocytes Relative 12 %   Monocytes Absolute 0.8 0.1 - 1.0 K/uL   Eosinophils Relative 1 %   Eosinophils Absolute 0.0 0.0 - 0.5 K/uL   Basophils Relative 0 %   Basophils Absolute 0.0 0.0 - 0.1 K/uL   Immature Granulocytes 1 %   Abs Immature Granulocytes 0.03 0.00 - 0.07 K/uL    Comment: Performed at [facility], [place] [address] [place], [place] [address]  Comprehensive metabolic panel     Status: Abnormal   Collection Time: 03/25/22  4:51 AM  Result Value Ref Range   Sodium 139 135 - 145 mmol/L   Potassium 4.1 3.5 - 5.1 mmol/L    Comment: HEMOLYSIS AT THIS LEVEL MAY AFFECT RESULT   Chloride 109 98 - 111 mmol/L   CO2 26 22 - 32 mmol/L   Glucose, Bld 112 (H) 70 - 99 mg/dL    Comment: Glucose reference range applies only to samples taken after fasting for at least 8 hours.   BUN 20 8 - 23 mg/dL   Creatinine, Ser 0.87 0.61 - 1.24 mg/dL   Calcium 8.5 (L) 8.9 - 10.3 mg/dL   Total Protein 5.4 (L) 6.5 - 8.1 g/dL   Albumin 2.8 (L) 3.5 - 5.0 g/dL   AST 45 (  H) 15 - 41 U/L    Comment: HEMOLYSIS AT THIS LEVEL MAY AFFECT RESULT   ALT 21 0 - 44 U/L    Comment: HEMOLYSIS AT THIS LEVEL MAY AFFECT RESULT   Alkaline Phosphatase 46 38 - 126 U/L   Total Bilirubin 1.3 (H) 0.3 - 1.2 mg/dL    Comment: HEMOLYSIS AT THIS LEVEL MAY AFFECT RESULT   GFR, Estimated >60 >60 mL/min    Comment: (NOTE) Calculated using  the CKD-EPI Creatinine Equation (2021)    Anion gap 4 (L) 5 - 15    Comment: Performed at [facility], [address], [place], [place] [address]  Magnesium     Status: None   Collection Time: 03/25/22  4:51 AM  Result Value Ref Range   Magnesium 2.4 1.7 - 2.4 mg/dL    Comment: Performed at [facility], [address], [place], [place] [address]    No results found.  Review of Systems  Constitutional: Negative.   HENT: Negative.    Eyes: Negative.   Respiratory: Negative.    Cardiovascular: Negative.   Gastrointestinal: Negative.   Endocrine: Negative.   Genitourinary: Negative.   Musculoskeletal:  Positive for arthralgias, gait problem, joint swelling and myalgias.  Skin: Negative.   Hematological: Negative.   Psychiatric/Behavioral: Negative.     Blood pressure (!) 149/71, pulse 80, temperature 98 F (36.7 C), temperature source Oral, resp. rate 18, height 5\' 9"  (1.753 m), weight 78.6 kg, SpO2 98 %. Physical Exam Constitutional:      Appearance: Normal appearance.  HENT:     Head: Normocephalic and atraumatic.     Right Ear: External ear normal.     Left Ear: External ear normal.     Nose: Nose normal.     Mouth/Throat:     Pharynx: Oropharynx is clear.  Eyes:     Conjunctiva/sclera: Conjunctivae normal.  Cardiovascular:     Rate and Rhythm: Normal rate and regular rhythm.     Pulses: Normal pulses.  Pulmonary:     Effort: Pulmonary effort is normal.     Breath sounds: Normal breath sounds.  Abdominal:     General: Bowel sounds are normal.  Musculoskeletal:     Cervical back: Normal range of motion.     Comments: Left hip tender laterally over greater trochanter No groin pain with rotation of the hip No calf pain or sign of DVT Normal sensation  Skin:    General: Skin is warm and dry.  Neurological:     Mental Status: He is alert.     Assessment/Plan: Recurrent L hip trochanteric bursitis, relief previously with  local cortisone injection in office  Plan: Inject at bedside today locally with cortisone prior to d/c May WBAT Follow up as needed as outpatient with Dr. June if pain ongoing  Alexander Rich 03/25/2022, 10:33 AM

## 2022-03-27 ENCOUNTER — Encounter (HOSPITAL_COMMUNITY): Payer: Self-pay | Admitting: Gastroenterology

## 2022-03-30 DIAGNOSIS — G8929 Other chronic pain: Secondary | ICD-10-CM | POA: Diagnosis not present

## 2022-03-30 DIAGNOSIS — E785 Hyperlipidemia, unspecified: Secondary | ICD-10-CM | POA: Diagnosis not present

## 2022-03-30 DIAGNOSIS — M160 Bilateral primary osteoarthritis of hip: Secondary | ICD-10-CM | POA: Diagnosis not present

## 2022-03-30 DIAGNOSIS — E663 Overweight: Secondary | ICD-10-CM | POA: Diagnosis not present

## 2022-03-30 DIAGNOSIS — D62 Acute posthemorrhagic anemia: Secondary | ICD-10-CM | POA: Diagnosis not present

## 2022-03-30 DIAGNOSIS — K269 Duodenal ulcer, unspecified as acute or chronic, without hemorrhage or perforation: Secondary | ICD-10-CM | POA: Diagnosis not present

## 2022-03-30 DIAGNOSIS — D7589 Other specified diseases of blood and blood-forming organs: Secondary | ICD-10-CM | POA: Diagnosis not present

## 2022-03-30 DIAGNOSIS — Z9181 History of falling: Secondary | ICD-10-CM | POA: Diagnosis not present

## 2022-03-30 DIAGNOSIS — Z6826 Body mass index (BMI) 26.0-26.9, adult: Secondary | ICD-10-CM | POA: Diagnosis not present

## 2022-03-30 DIAGNOSIS — D696 Thrombocytopenia, unspecified: Secondary | ICD-10-CM | POA: Diagnosis not present

## 2022-03-30 DIAGNOSIS — N400 Enlarged prostate without lower urinary tract symptoms: Secondary | ICD-10-CM | POA: Diagnosis not present

## 2022-03-30 DIAGNOSIS — I10 Essential (primary) hypertension: Secondary | ICD-10-CM | POA: Diagnosis not present

## 2022-03-30 DIAGNOSIS — K219 Gastro-esophageal reflux disease without esophagitis: Secondary | ICD-10-CM | POA: Diagnosis not present

## 2022-03-30 DIAGNOSIS — M7072 Other bursitis of hip, left hip: Secondary | ICD-10-CM | POA: Diagnosis not present

## 2022-04-03 DIAGNOSIS — Z8719 Personal history of other diseases of the digestive system: Secondary | ICD-10-CM | POA: Diagnosis not present

## 2022-04-03 DIAGNOSIS — R5381 Other malaise: Secondary | ICD-10-CM | POA: Diagnosis not present

## 2022-04-03 DIAGNOSIS — D649 Anemia, unspecified: Secondary | ICD-10-CM | POA: Diagnosis not present

## 2022-04-17 DIAGNOSIS — L720 Epidermal cyst: Secondary | ICD-10-CM | POA: Diagnosis not present

## 2022-05-06 DIAGNOSIS — G8929 Other chronic pain: Secondary | ICD-10-CM | POA: Diagnosis not present

## 2022-05-06 DIAGNOSIS — D696 Thrombocytopenia, unspecified: Secondary | ICD-10-CM | POA: Diagnosis not present

## 2022-05-06 DIAGNOSIS — D7589 Other specified diseases of blood and blood-forming organs: Secondary | ICD-10-CM | POA: Diagnosis not present

## 2022-05-06 DIAGNOSIS — N400 Enlarged prostate without lower urinary tract symptoms: Secondary | ICD-10-CM | POA: Diagnosis not present

## 2022-05-06 DIAGNOSIS — M7072 Other bursitis of hip, left hip: Secondary | ICD-10-CM | POA: Diagnosis not present

## 2022-05-06 DIAGNOSIS — I10 Essential (primary) hypertension: Secondary | ICD-10-CM | POA: Diagnosis not present

## 2022-05-06 DIAGNOSIS — K269 Duodenal ulcer, unspecified as acute or chronic, without hemorrhage or perforation: Secondary | ICD-10-CM | POA: Diagnosis not present

## 2022-05-06 DIAGNOSIS — M160 Bilateral primary osteoarthritis of hip: Secondary | ICD-10-CM | POA: Diagnosis not present

## 2022-05-06 DIAGNOSIS — Z9181 History of falling: Secondary | ICD-10-CM | POA: Diagnosis not present

## 2022-05-06 DIAGNOSIS — Z6826 Body mass index (BMI) 26.0-26.9, adult: Secondary | ICD-10-CM | POA: Diagnosis not present

## 2022-05-06 DIAGNOSIS — K219 Gastro-esophageal reflux disease without esophagitis: Secondary | ICD-10-CM | POA: Diagnosis not present

## 2022-05-06 DIAGNOSIS — E785 Hyperlipidemia, unspecified: Secondary | ICD-10-CM | POA: Diagnosis not present

## 2022-05-06 DIAGNOSIS — E663 Overweight: Secondary | ICD-10-CM | POA: Diagnosis not present

## 2022-05-06 DIAGNOSIS — D62 Acute posthemorrhagic anemia: Secondary | ICD-10-CM | POA: Diagnosis not present

## 2022-05-13 DIAGNOSIS — D696 Thrombocytopenia, unspecified: Secondary | ICD-10-CM | POA: Diagnosis not present

## 2022-05-13 DIAGNOSIS — D7589 Other specified diseases of blood and blood-forming organs: Secondary | ICD-10-CM | POA: Diagnosis not present

## 2022-05-13 DIAGNOSIS — D62 Acute posthemorrhagic anemia: Secondary | ICD-10-CM | POA: Diagnosis not present

## 2022-05-13 DIAGNOSIS — M160 Bilateral primary osteoarthritis of hip: Secondary | ICD-10-CM | POA: Diagnosis not present

## 2022-05-13 DIAGNOSIS — I10 Essential (primary) hypertension: Secondary | ICD-10-CM | POA: Diagnosis not present

## 2022-05-13 DIAGNOSIS — Z9181 History of falling: Secondary | ICD-10-CM | POA: Diagnosis not present

## 2022-05-13 DIAGNOSIS — Z6826 Body mass index (BMI) 26.0-26.9, adult: Secondary | ICD-10-CM | POA: Diagnosis not present

## 2022-05-13 DIAGNOSIS — E785 Hyperlipidemia, unspecified: Secondary | ICD-10-CM | POA: Diagnosis not present

## 2022-05-13 DIAGNOSIS — G8929 Other chronic pain: Secondary | ICD-10-CM | POA: Diagnosis not present

## 2022-05-13 DIAGNOSIS — K219 Gastro-esophageal reflux disease without esophagitis: Secondary | ICD-10-CM | POA: Diagnosis not present

## 2022-05-13 DIAGNOSIS — K269 Duodenal ulcer, unspecified as acute or chronic, without hemorrhage or perforation: Secondary | ICD-10-CM | POA: Diagnosis not present

## 2022-05-13 DIAGNOSIS — N400 Enlarged prostate without lower urinary tract symptoms: Secondary | ICD-10-CM | POA: Diagnosis not present

## 2022-05-13 DIAGNOSIS — E663 Overweight: Secondary | ICD-10-CM | POA: Diagnosis not present

## 2022-05-13 DIAGNOSIS — M7072 Other bursitis of hip, left hip: Secondary | ICD-10-CM | POA: Diagnosis not present

## 2022-05-14 DIAGNOSIS — M7062 Trochanteric bursitis, left hip: Secondary | ICD-10-CM | POA: Diagnosis not present

## 2022-05-21 DIAGNOSIS — G8929 Other chronic pain: Secondary | ICD-10-CM | POA: Diagnosis not present

## 2022-05-21 DIAGNOSIS — I10 Essential (primary) hypertension: Secondary | ICD-10-CM | POA: Diagnosis not present

## 2022-05-21 DIAGNOSIS — E663 Overweight: Secondary | ICD-10-CM | POA: Diagnosis not present

## 2022-05-21 DIAGNOSIS — D7589 Other specified diseases of blood and blood-forming organs: Secondary | ICD-10-CM | POA: Diagnosis not present

## 2022-05-21 DIAGNOSIS — K219 Gastro-esophageal reflux disease without esophagitis: Secondary | ICD-10-CM | POA: Diagnosis not present

## 2022-05-21 DIAGNOSIS — Z9181 History of falling: Secondary | ICD-10-CM | POA: Diagnosis not present

## 2022-05-21 DIAGNOSIS — M7072 Other bursitis of hip, left hip: Secondary | ICD-10-CM | POA: Diagnosis not present

## 2022-05-21 DIAGNOSIS — Z6826 Body mass index (BMI) 26.0-26.9, adult: Secondary | ICD-10-CM | POA: Diagnosis not present

## 2022-05-21 DIAGNOSIS — D62 Acute posthemorrhagic anemia: Secondary | ICD-10-CM | POA: Diagnosis not present

## 2022-05-21 DIAGNOSIS — D696 Thrombocytopenia, unspecified: Secondary | ICD-10-CM | POA: Diagnosis not present

## 2022-05-21 DIAGNOSIS — N400 Enlarged prostate without lower urinary tract symptoms: Secondary | ICD-10-CM | POA: Diagnosis not present

## 2022-05-21 DIAGNOSIS — E785 Hyperlipidemia, unspecified: Secondary | ICD-10-CM | POA: Diagnosis not present

## 2022-05-21 DIAGNOSIS — K269 Duodenal ulcer, unspecified as acute or chronic, without hemorrhage or perforation: Secondary | ICD-10-CM | POA: Diagnosis not present

## 2022-05-21 DIAGNOSIS — M160 Bilateral primary osteoarthritis of hip: Secondary | ICD-10-CM | POA: Diagnosis not present

## 2022-05-25 DIAGNOSIS — D649 Anemia, unspecified: Secondary | ICD-10-CM | POA: Diagnosis not present

## 2022-05-25 DIAGNOSIS — K921 Melena: Secondary | ICD-10-CM | POA: Diagnosis not present

## 2022-05-26 DIAGNOSIS — K219 Gastro-esophageal reflux disease without esophagitis: Secondary | ICD-10-CM | POA: Diagnosis not present

## 2022-05-26 DIAGNOSIS — M160 Bilateral primary osteoarthritis of hip: Secondary | ICD-10-CM | POA: Diagnosis not present

## 2022-05-26 DIAGNOSIS — M7072 Other bursitis of hip, left hip: Secondary | ICD-10-CM | POA: Diagnosis not present

## 2022-05-26 DIAGNOSIS — N400 Enlarged prostate without lower urinary tract symptoms: Secondary | ICD-10-CM | POA: Diagnosis not present

## 2022-05-26 DIAGNOSIS — I10 Essential (primary) hypertension: Secondary | ICD-10-CM | POA: Diagnosis not present

## 2022-05-26 DIAGNOSIS — D7589 Other specified diseases of blood and blood-forming organs: Secondary | ICD-10-CM | POA: Diagnosis not present

## 2022-05-26 DIAGNOSIS — Z9181 History of falling: Secondary | ICD-10-CM | POA: Diagnosis not present

## 2022-05-26 DIAGNOSIS — K269 Duodenal ulcer, unspecified as acute or chronic, without hemorrhage or perforation: Secondary | ICD-10-CM | POA: Diagnosis not present

## 2022-05-26 DIAGNOSIS — Z6826 Body mass index (BMI) 26.0-26.9, adult: Secondary | ICD-10-CM | POA: Diagnosis not present

## 2022-05-26 DIAGNOSIS — E785 Hyperlipidemia, unspecified: Secondary | ICD-10-CM | POA: Diagnosis not present

## 2022-05-26 DIAGNOSIS — D696 Thrombocytopenia, unspecified: Secondary | ICD-10-CM | POA: Diagnosis not present

## 2022-05-26 DIAGNOSIS — G8929 Other chronic pain: Secondary | ICD-10-CM | POA: Diagnosis not present

## 2022-05-26 DIAGNOSIS — D62 Acute posthemorrhagic anemia: Secondary | ICD-10-CM | POA: Diagnosis not present

## 2022-05-26 DIAGNOSIS — E663 Overweight: Secondary | ICD-10-CM | POA: Diagnosis not present

## 2022-06-03 DIAGNOSIS — L720 Epidermal cyst: Secondary | ICD-10-CM | POA: Diagnosis not present

## 2022-06-09 DIAGNOSIS — M25552 Pain in left hip: Secondary | ICD-10-CM | POA: Diagnosis not present

## 2022-06-23 DIAGNOSIS — L720 Epidermal cyst: Secondary | ICD-10-CM | POA: Diagnosis not present

## 2022-06-26 DIAGNOSIS — K635 Polyp of colon: Secondary | ICD-10-CM | POA: Diagnosis not present

## 2022-06-26 DIAGNOSIS — R600 Localized edema: Secondary | ICD-10-CM | POA: Diagnosis not present

## 2022-06-26 DIAGNOSIS — K269 Duodenal ulcer, unspecified as acute or chronic, without hemorrhage or perforation: Secondary | ICD-10-CM | POA: Diagnosis not present

## 2022-06-30 DIAGNOSIS — M25511 Pain in right shoulder: Secondary | ICD-10-CM | POA: Diagnosis not present

## 2022-07-08 DIAGNOSIS — R600 Localized edema: Secondary | ICD-10-CM | POA: Diagnosis not present

## 2022-07-09 DIAGNOSIS — I119 Hypertensive heart disease without heart failure: Secondary | ICD-10-CM | POA: Diagnosis not present

## 2022-07-09 DIAGNOSIS — M5416 Radiculopathy, lumbar region: Secondary | ICD-10-CM | POA: Diagnosis not present

## 2022-07-09 DIAGNOSIS — K5900 Constipation, unspecified: Secondary | ICD-10-CM | POA: Diagnosis not present

## 2022-07-09 DIAGNOSIS — I872 Venous insufficiency (chronic) (peripheral): Secondary | ICD-10-CM | POA: Diagnosis not present

## 2022-07-14 ENCOUNTER — Emergency Department (HOSPITAL_COMMUNITY): Payer: Medicare Other

## 2022-07-14 ENCOUNTER — Other Ambulatory Visit: Payer: Self-pay

## 2022-07-14 ENCOUNTER — Inpatient Hospital Stay (HOSPITAL_COMMUNITY)
Admission: EM | Admit: 2022-07-14 | Discharge: 2022-07-18 | DRG: 841 | Disposition: A | Discharge status: Home Health | Payer: Medicare Other | Attending: Internal Medicine | Admitting: Internal Medicine

## 2022-07-14 ENCOUNTER — Encounter (HOSPITAL_COMMUNITY): Payer: Self-pay

## 2022-07-14 DIAGNOSIS — N429 Disorder of prostate, unspecified: Secondary | ICD-10-CM | POA: Diagnosis not present

## 2022-07-14 DIAGNOSIS — Z8711 Personal history of peptic ulcer disease: Secondary | ICD-10-CM

## 2022-07-14 DIAGNOSIS — K6389 Other specified diseases of intestine: Secondary | ICD-10-CM | POA: Diagnosis not present

## 2022-07-14 DIAGNOSIS — C772 Secondary and unspecified malignant neoplasm of intra-abdominal lymph nodes: Secondary | ICD-10-CM | POA: Diagnosis not present

## 2022-07-14 DIAGNOSIS — I10 Essential (primary) hypertension: Secondary | ICD-10-CM | POA: Diagnosis not present

## 2022-07-14 DIAGNOSIS — N400 Enlarged prostate without lower urinary tract symptoms: Secondary | ICD-10-CM | POA: Diagnosis not present

## 2022-07-14 DIAGNOSIS — M1612 Unilateral primary osteoarthritis, left hip: Secondary | ICD-10-CM | POA: Diagnosis present

## 2022-07-14 DIAGNOSIS — Z841 Family history of disorders of kidney and ureter: Secondary | ICD-10-CM

## 2022-07-14 DIAGNOSIS — Z79899 Other long term (current) drug therapy: Secondary | ICD-10-CM

## 2022-07-14 DIAGNOSIS — G8929 Other chronic pain: Secondary | ICD-10-CM | POA: Diagnosis not present

## 2022-07-14 DIAGNOSIS — C7951 Secondary malignant neoplasm of bone: Secondary | ICD-10-CM | POA: Diagnosis not present

## 2022-07-14 DIAGNOSIS — R600 Localized edema: Secondary | ICD-10-CM | POA: Diagnosis present

## 2022-07-14 DIAGNOSIS — Z8719 Personal history of other diseases of the digestive system: Secondary | ICD-10-CM

## 2022-07-14 DIAGNOSIS — M160 Bilateral primary osteoarthritis of hip: Secondary | ICD-10-CM | POA: Diagnosis not present

## 2022-07-14 DIAGNOSIS — M7989 Other specified soft tissue disorders: Secondary | ICD-10-CM | POA: Diagnosis present

## 2022-07-14 DIAGNOSIS — Z823 Family history of stroke: Secondary | ICD-10-CM | POA: Diagnosis not present

## 2022-07-14 DIAGNOSIS — K8020 Calculus of gallbladder without cholecystitis without obstruction: Secondary | ICD-10-CM | POA: Diagnosis not present

## 2022-07-14 DIAGNOSIS — C61 Malignant neoplasm of prostate: Secondary | ICD-10-CM | POA: Diagnosis present

## 2022-07-14 DIAGNOSIS — Z87891 Personal history of nicotine dependence: Secondary | ICD-10-CM | POA: Diagnosis not present

## 2022-07-14 DIAGNOSIS — R590 Localized enlarged lymph nodes: Secondary | ICD-10-CM | POA: Diagnosis not present

## 2022-07-14 DIAGNOSIS — D539 Nutritional anemia, unspecified: Secondary | ICD-10-CM | POA: Diagnosis present

## 2022-07-14 DIAGNOSIS — R918 Other nonspecific abnormal finding of lung field: Secondary | ICD-10-CM | POA: Diagnosis not present

## 2022-07-14 DIAGNOSIS — I890 Lymphedema, not elsewhere classified: Secondary | ICD-10-CM | POA: Diagnosis not present

## 2022-07-14 DIAGNOSIS — M7072 Other bursitis of hip, left hip: Secondary | ICD-10-CM | POA: Diagnosis not present

## 2022-07-14 DIAGNOSIS — K639 Disease of intestine, unspecified: Secondary | ICD-10-CM

## 2022-07-14 DIAGNOSIS — J479 Bronchiectasis, uncomplicated: Secondary | ICD-10-CM | POA: Diagnosis not present

## 2022-07-14 DIAGNOSIS — M25552 Pain in left hip: Secondary | ICD-10-CM

## 2022-07-14 DIAGNOSIS — M899 Disorder of bone, unspecified: Secondary | ICD-10-CM | POA: Diagnosis not present

## 2022-07-14 DIAGNOSIS — N3289 Other specified disorders of bladder: Secondary | ICD-10-CM | POA: Diagnosis not present

## 2022-07-14 LAB — COMPREHENSIVE METABOLIC PANEL
ALT: 17 U/L (ref 0–44)
AST: 25 U/L (ref 15–41)
Albumin: 3.6 g/dL (ref 3.5–5.0)
Alkaline Phosphatase: 47 U/L (ref 38–126)
Anion gap: 8 (ref 5–15)
BUN: 17 mg/dL (ref 8–23)
CO2: 25 mmol/L (ref 22–32)
Calcium: 8.5 mg/dL — ABNORMAL LOW (ref 8.9–10.3)
Chloride: 102 mmol/L (ref 98–111)
Creatinine, Ser: 1.08 mg/dL (ref 0.61–1.24)
GFR, Estimated: >60 mL/min (ref >60)
Glucose, Bld: 109 mg/dL — ABNORMAL HIGH (ref 70–99)
Potassium: 4 mmol/L (ref 3.5–5.1)
Sodium: 135 mmol/L (ref 135–145)
Total Bilirubin: 0.9 mg/dL (ref 0.3–1.2)
Total Protein: 6.4 g/dL — ABNORMAL LOW (ref 6.5–8.1)

## 2022-07-14 LAB — CBC
HCT: 38.6 % — ABNORMAL LOW (ref 39.0–52.0)
Hemoglobin: 12.3 g/dL — ABNORMAL LOW (ref 13.0–17.0)
MCH: 31.5 pg (ref 26.0–34.0)
MCHC: 31.9 g/dL (ref 30.0–36.0)
MCV: 99 fL (ref 80.0–100.0)
Platelets: 181 10*3/uL (ref 150–400)
RBC: 3.9 MIL/uL — ABNORMAL LOW (ref 4.22–5.81)
RDW: 15 % (ref 11.5–15.5)
WBC: 7.1 10*3/uL (ref 4.0–10.5)
nRBC: 0 % (ref 0.0–0.2)

## 2022-07-14 LAB — BRAIN NATRIURETIC PEPTIDE: B Natriuretic Peptide: 84 pg/mL (ref 0.0–100.0)

## 2022-07-14 MED ORDER — VITAMIN B-12 1000 MCG PO TABS
1000.0000 ug | ORAL_TABLET | Freq: Every day | ORAL | Status: DC
  Administered 2022-07-15 – 2022-07-18 (×4): 1000 ug via ORAL
  Filled 2022-07-14 (×4): qty 1

## 2022-07-14 MED ORDER — FUROSEMIDE 10 MG/ML IJ SOLN
40.0000 mg | Freq: Two times a day (BID) | INTRAMUSCULAR | Status: DC
  Administered 2022-07-15 – 2022-07-17 (×5): 40 mg via INTRAVENOUS
  Filled 2022-07-14 (×5): qty 4

## 2022-07-14 MED ORDER — MORPHINE SULFATE (PF) 4 MG/ML IV SOLN
4.0000 mg | Freq: Once | INTRAVENOUS | Status: AC
  Administered 2022-07-14: 4 mg via INTRAVENOUS
  Filled 2022-07-14: qty 1

## 2022-07-14 MED ORDER — PANTOPRAZOLE SODIUM 40 MG PO TBEC
40.0000 mg | DELAYED_RELEASE_TABLET | Freq: Every day | ORAL | Status: DC
  Administered 2022-07-15 – 2022-07-18 (×4): 40 mg via ORAL
  Filled 2022-07-14 (×4): qty 1

## 2022-07-14 MED ORDER — FUROSEMIDE 10 MG/ML IJ SOLN
40.0000 mg | Freq: Once | INTRAMUSCULAR | Status: AC
  Administered 2022-07-14: 40 mg via INTRAVENOUS
  Filled 2022-07-14: qty 4

## 2022-07-14 MED ORDER — METHOCARBAMOL 500 MG PO TABS
500.0000 mg | ORAL_TABLET | Freq: Once | ORAL | Status: AC
  Administered 2022-07-14: 500 mg via ORAL
  Filled 2022-07-14: qty 1

## 2022-07-14 NOTE — ED Triage Notes (Signed)
Pt reports left hip pain radiating down leg.  Left lower extremity swelling.  Pt reports on lasix and hasn't missed any doses.  Told by orthopedic doctor had Bursitis.  Denies sob.

## 2022-07-14 NOTE — Assessment & Plan Note (Addendum)
-   continue home regimen  

## 2022-07-14 NOTE — ED Notes (Signed)
X ray in room.

## 2022-07-14 NOTE — Assessment & Plan Note (Signed)
Continue Protonix °

## 2022-07-14 NOTE — Assessment & Plan Note (Addendum)
-   Patient endorses dependent edema at end of the day however typically is resolved when waking up in the mornings.  Now notes that edema is persistent in the mornings -BNP 86.  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  He was recommended to continue supportive care with leg elevation and compression stockings as needed

## 2022-07-14 NOTE — Assessment & Plan Note (Signed)
-   Continue Flomax 

## 2022-07-14 NOTE — H&P (Signed)
History and Physical    Alexander Rich  [id number]  DOB: 1926/02/25  DOA: 07/14/2022  PCP: Antony Contras, MD Patient coming from: Home  Chief Complaint: LE edema, difficulty walking, pain in left hip  HPI:  Alexander Rich is a 89  yo male with PMH chronic left hip pain due to osteoarthritis, HTN, BPH, sinus brady, duodenal ulcer who presents with worsening left hip pain, increased swelling in bilateral legs, and difficulty ambulating due to this. He typically lives alone and is independent.  Due to worsening lower extremity edema and pain, he is now having much more difficulty completing typical ADLs.  He was dependent on his nephew to help him get to the hospital today for further evaluation.  He follows outpatient routinely with orthopedic surgery for routine left hip injections.  He also undergoes intermittent right shoulder injections as well. Last hospitalization in November 2023 when he presented for GI bleed, he also was able to undergo joint injection with Ortho prior to discharge to help with achieving adequate ambulation.  He was given a dose of IV Lasix which he responded well to with brisk diuresis.  Pain was treated with morphine and Robaxin.  His goal is to return to independent living status if able and is motivated for working with PT/OT also. Due to uncontrolled pain, he is admitted for further pain control and treatment of lower extremity edema.  I have personally briefly reviewed patient's old medical records in [facility] and discussed patient with the ER provider when appropriate/indicated.  Assessment and Plan: * Bilateral lower extremity edema - Patient endorses dependent edema at end of the day however typically is resolved when waking up in the mornings.  Now notes that edema is persistent in the mornings -BNP 84.  Denies any PND, orthopnea, dyspnea -Other considered etiology includes medication side effect of amlodipine which will be held.  Does respond to IV  Lasix which will be continued - monitor strict I&O - may consider LE duplex depending on response - place TED hose  Primary localized osteoarthritis of left hip - uncontrolled pain on admission and patient unable to ambulate due to this - Continue further pain control - PT/OT evaluation - Will likely try and reach out to Ortho in the morning to see if able to have steroid injection.  Has also received p.o. steroid courses in the past  Essential hypertension - Hold amlodipine in setting of lower extremity edema - Continue losartan  Macrocytic anemia - Hemoglobin has improved from prior hospitalization - prior B12 level low normal; start on supplementation    Benign prostatic hyperplasia without urinary obstruction - Continue Flomax  History of duodenal ulcer - Continue Protonix    Code Status:     Code Status: Full Code  DVT Prophylaxis:   Place TED hose Start: 07/14/22 2122   Anticipated disposition is to: Pending PT/OT eval  History: Past Medical History:  Diagnosis Date   Constipation    Hip pain    Hyperpigmented skin lesions 03/21/2022   Hypertension     Past Surgical History:  Procedure Laterality Date   BACK SURGERY     ESOPHAGOGASTRODUODENOSCOPY N/A 03/24/2022   Procedure: ESOPHAGOGASTRODUODENOSCOPY (EGD);  Surgeon: Arta Silence, MD;  Location: [facility];  Service: Gastroenterology;  Laterality: N/A;     reports that he has never smoked. He has never used smokeless tobacco. He reports that he does not drink alcohol and does not use drugs.  No Known Allergies  Family History  Problem Relation Age of Onset   Kidney disease Mother    CVA Father     Home Medications: Prior to Admission medications   Medication Sig Start Date End Date Taking? Authorizing Provider  acetaminophen (TYLENOL) 325 MG tablet Take 1 tablet (325 mg total) by mouth every 6 (six) hours as needed for mild pain (or Fever >/= 101). 03/25/22   Eugenie Filler, MD  amLODipine  (NORVASC) 2.5 MG tablet Take 2.5 mg by mouth in the morning. 09/12/19   [provider]  ASCORBIC ACID PO Take 1 tablet by mouth in the morning. Vitamin C, unknown strength    [provider]  furosemide (LASIX) 20 MG tablet Take 1 tablet (20 mg total) by mouth daily as needed for fluid or edema. 03/25/22 06/23/22  Eugenie Filler, MD  linaclotide [facility]) 145 MCG CAPS capsule Take 145 mcg by mouth daily before breakfast.    [provider]  losartan (COZAAR) 100 MG tablet Take 1 tablet (100 mg total) by mouth in the morning. 03/30/22   Eugenie Filler, MD  Multiple Vitamins-Minerals (ZINC PO) Take 1 tablet by mouth in the morning. Zinc supplement, unknown strength.    [provider]  oxyCODONE-acetaminophen (PERCOCET/ROXICET) 5-325 MG tablet Take 2 tablets by mouth every 6 (six) hours as needed for pain. 08/15/19   [provider]  pantoprazole (PROTONIX) 40 MG tablet Take 1 tablet (40 mg total) by mouth 2 (two) times daily before a meal for 42 days, THEN 1 tablet (40 mg total) daily. 03/25/22 06/17/22  Eugenie Filler, MD  Polyethyl Glycol-Propyl Glycol (SYSTANE OP) Place 1 drop into both eyes 4 (four) times daily as needed (dry eyes).    [provider]  polyethylene glycol (MIRALAX / GLYCOLAX) 17 g packet Take 17 g by mouth daily as needed. Hold for diarrhea. Take when taking percocet 03/25/22   Eugenie Filler, MD  senna-docusate (SENOKOT-S) 8.6-50 MG tablet Take 1 tablet by mouth 2 (two) times daily. 03/25/22   Eugenie Filler, MD  tamsulosin (FLOMAX) 0.4 MG CAPS capsule Take 0.4 mg by mouth in the morning. 09/12/19   [provider]    Review of Systems:  Review of Systems  Constitutional:  Positive for malaise/fatigue.  HENT: Negative.    Eyes: Negative.   Respiratory:  Negative for shortness of breath and wheezing.   Cardiovascular:  Positive for leg swelling. Negative for orthopnea and PND.  Gastrointestinal:  Negative.   Genitourinary: Negative.   Musculoskeletal:  Positive for joint pain.  Skin: Negative.   Neurological: Negative.   Endo/Heme/Allergies: Negative.   Psychiatric/Behavioral: Negative.      Physical Exam:  Vitals:   07/14/22 1836 07/14/22 1837 07/14/22 1838 07/14/22 1923  BP:    (!) 156/84  Pulse:    84  Resp:    18  Temp:    97.9 F (36.6 C)  TempSrc:    Oral  SpO2: 100% 99% 100% 97%  Weight:       Physical Exam Constitutional:      General: He is not in acute distress.    Appearance: Normal appearance. He is not ill-appearing.  HENT:     Head: Normocephalic and atraumatic.     Mouth/Throat:     Mouth: Mucous membranes are moist.  Eyes:     Extraocular Movements: Extraocular movements intact.  Cardiovascular:     Rate and Rhythm: Normal rate and regular rhythm.  Pulmonary:     Effort: Pulmonary  effort is normal. No respiratory distress.     Breath sounds: Normal breath sounds. No wheezing.  Abdominal:     General: Bowel sounds are normal. There is no distension.     Palpations: Abdomen is soft.     Tenderness: There is no abdominal tenderness.  Musculoskeletal:        General: Swelling (3+ B/L LE pitting edema to knees) present.     Cervical back: Normal range of motion and neck supple.     Comments: Decreased passive ROM in left hip due to pain  Skin:    General: Skin is warm and dry.  Neurological:     General: No focal deficit present.     Mental Status: He is alert.  Psychiatric:        Mood and Affect: Mood normal.      Labs on Admission:  I have personally reviewed following labs and imaging studies Results for orders placed or performed during the hospital encounter of 07/14/22 (from the past 24 hour(s))  CBC     Status: Abnormal   Collection Time: 07/14/22  3:38 PM  Result Value Ref Range   WBC 7.1 4.0 - 10.5 K/uL   RBC 3.90 (L) 4.22 - 5.81 MIL/uL   Hemoglobin 12.3 (L) 13.0 - 17.0 g/dL   HCT 38.6 (L) 39.0 - 52.0 %   MCV 99.0 80.0 -  100.0 fL   MCH 31.5 26.0 - 34.0 pg   MCHC 31.9 30.0 - 36.0 g/dL   RDW 15.0 11.5 - 15.5 %   Platelets 181 150 - 400 K/uL   nRBC 0.0 0.0 - 0.2 %  Comprehensive metabolic panel     Status: Abnormal   Collection Time: 07/14/22  3:38 PM  Result Value Ref Range   Sodium 135 135 - 145 mmol/L   Potassium 4.0 3.5 - 5.1 mmol/L   Chloride 102 98 - 111 mmol/L   CO2 25 22 - 32 mmol/L   Glucose, Bld 109 (H) 70 - 99 mg/dL   BUN 17 8 - 23 mg/dL   Creatinine, Ser 1.08 0.61 - 1.24 mg/dL   Calcium 8.5 (L) 8.9 - 10.3 mg/dL   Total Protein 6.4 (L) 6.5 - 8.1 g/dL   Albumin 3.6 3.5 - 5.0 g/dL   AST 25 15 - 41 U/L   ALT 17 0 - 44 U/L   Alkaline Phosphatase 47 38 - 126 U/L   Total Bilirubin 0.9 0.3 - 1.2 mg/dL   GFR, Estimated >60 >60 mL/min   Anion gap 8 5 - 15  Brain natriuretic peptide     Status: None   Collection Time: 07/14/22  3:38 PM  Result Value Ref Range   B Natriuretic Peptide 84.0 0.0 - 100.0 pg/mL     Radiological Exams on Admission: DG Hip Unilat W or Wo Pelvis 2-3 Views Left  Result Date: 07/14/2022 CLINICAL DATA:  Left hip pain EXAM: DG HIP (WITH OR WITHOUT PELVIS) 2-3V LEFT COMPARISON:  None Available. FINDINGS: Bones are osteopenic. There is no evidence of hip fracture or dislocation. Hip joint spaces are maintained. There are degenerative changes of the lower lumbar spine. IMPRESSION: 1. No acute fracture or dislocation. 2. Osteopenia. Electronically Signed   By: Ronney Asters M.D.   On: 07/14/2022 19:19   DG Chest 2 View  Result Date: 07/14/2022 CLINICAL DATA:  Fluid retention EXAM: CHEST - 2 VIEW COMPARISON:  CXR 05/24/08 FINDINGS: Bibasilar airspace opacities, which could represent atelectasis or infection. Normal cardiac and  mediastinal contours. No radiographically apparent displaced rib fractures. Visualized upper abdomen is unremarkable. Vertebral body heights are maintained. IMPRESSION: No significant pulmonary edema or pleural effusions. Electronically Signed   By: Marin Roberts M.D.   On: 07/14/2022 15:58   DG Hip Unilat W or Wo Pelvis 2-3 Views Left  Final Result    DG Chest 2 View  Final Result       EKG: Independently reviewed. NSR with PVC   Dwyane Dee, MD [facility] 07/14/2022, 9:25 PM

## 2022-07-14 NOTE — ED Provider Notes (Signed)
[facility] Provider Note   CSN: [id number] Arrival date & time: 07/14/22  1441     History  Chief Complaint  Patient presents with   Hip Pain    Alexander Rich is a 89 y.o. male history of hypertension, here presenting with leg swelling and hip pain.  Patient has a history of leg swelling got progressively worse over the last several weeks.  He states that it is so painful that he has a hard time walking now.  He also lives at home by himself.  He also has left hip pain.  He saw orthopedic doctor several weeks ago and was given a steroid injection and put on a prednisone taper.  He is also on oxycodone at home.  He states that despite all these interventions, he still is in a lot of pain and unable to walk.  He also has nobody to take care of him at home as well.  Patient states that he takes Lasix 20 mg daily.  The history is provided by the patient.       Home Medications Prior to Admission medications   Medication Sig Start Date End Date Taking? Authorizing Provider  acetaminophen (TYLENOL) 325 MG tablet Take 1 tablet (325 mg total) by mouth every 6 (six) hours as needed for mild pain (or Fever >/= 101). 03/25/22   Eugenie Filler, MD  amLODipine (NORVASC) 2.5 MG tablet Take 2.5 mg by mouth in the morning. 09/12/19   [provider]  ASCORBIC ACID PO Take 1 tablet by mouth in the morning. Vitamin C, unknown strength    [provider]  furosemide (LASIX) 20 MG tablet Take 1 tablet (20 mg total) by mouth daily as needed for fluid or edema. 03/25/22 06/23/22  Eugenie Filler, MD  linaclotide [facility]) 145 MCG CAPS capsule Take 145 mcg by mouth daily before breakfast.    [provider]  losartan (COZAAR) 100 MG tablet Take 1 tablet (100 mg total) by mouth in the morning. 03/30/22   Eugenie Filler, MD  Multiple Vitamins-Minerals (ZINC PO) Take 1 tablet by mouth in the morning. Zinc supplement, unknown  strength.    [provider]  oxyCODONE-acetaminophen (PERCOCET/ROXICET) 5-325 MG tablet Take 2 tablets by mouth every 6 (six) hours as needed for pain. 08/15/19   [provider]  pantoprazole (PROTONIX) 40 MG tablet Take 1 tablet (40 mg total) by mouth 2 (two) times daily before a meal for 42 days, THEN 1 tablet (40 mg total) daily. 03/25/22 06/17/22  Eugenie Filler, MD  Polyethyl Glycol-Propyl Glycol (SYSTANE OP) Place 1 drop into both eyes 4 (four) times daily as needed (dry eyes).    [provider]  polyethylene glycol (MIRALAX / GLYCOLAX) 17 g packet Take 17 g by mouth daily as needed. Hold for diarrhea. Take when taking percocet 03/25/22   Eugenie Filler, MD  senna-docusate (SENOKOT-S) 8.6-50 MG tablet Take 1 tablet by mouth 2 (two) times daily. 03/25/22   Eugenie Filler, MD  tamsulosin (FLOMAX) 0.4 MG CAPS capsule Take 0.4 mg by mouth in the morning. 09/12/19   [provider]      Allergies    Patient has no known allergies.    Review of Systems   Review of Systems  Musculoskeletal:        Left hip pain  All other systems reviewed and are negative.   Physical Exam Updated Vital Signs BP (!) 144/81 (  BP Location: Right Arm)   Pulse 79   Temp 97.7 F (36.5 C) (Oral)   Resp 17   Wt 78 kg   SpO2 100%   BMI 25.39 kg/m  Physical Exam Vitals and nursing note reviewed.  Constitutional:      Comments: Chronically ill  HENT:     Head: Normocephalic.     Nose: Nose normal.     Mouth/Throat:     Mouth: Mucous membranes are moist.  Eyes:     Extraocular Movements: Extraocular movements intact.     Pupils: Pupils are equal, round, and reactive to light.  Cardiovascular:     Rate and Rhythm: Normal rate and regular rhythm.     Pulses: Normal pulses.     Heart sounds: Normal heart sounds.  Pulmonary:     Effort: Pulmonary effort is normal.     Breath sounds: Normal breath sounds.  Abdominal:     General: Abdomen is flat.      Palpations: Abdomen is soft.  Musculoskeletal:     Cervical back: Normal range of motion and neck supple.     Comments: + Left hip tenderness.  Normal range of motion bilateral hips.  2+ edema bilateral legs   Skin:    General: Skin is warm.     Capillary Refill: Capillary refill takes less than 2 seconds.  Neurological:     General: No focal deficit present.     Mental Status: He is alert and oriented to person, place, and time.  Psychiatric:        Mood and Affect: Mood normal.        Behavior: Behavior normal.     ED Results / Procedures / Treatments   Labs (all labs ordered are listed, but only abnormal results are displayed) Labs Reviewed  CBC - Abnormal; Notable for the following components:      Result Value   RBC 3.90 (*)    Hemoglobin 12.3 (*)    HCT 38.6 (*)    All other components within normal limits  COMPREHENSIVE METABOLIC PANEL - Abnormal; Notable for the following components:   Glucose, Bld 109 (*)    Calcium 8.5 (*)    Total Protein 6.4 (*)    All other components within normal limits  BRAIN NATRIURETIC PEPTIDE    EKG EKG Interpretation  Date/Time:  Tuesday July 14 2022 15:29:28 EDT Ventricular Rate:  93 PR Interval:  267 QRS Duration: 148 QT Interval:  391 QTC Calculation: 487 R Axis:   -61 Text Interpretation: Sinus rhythm Multiform ventricular premature complexes Prolonged PR interval RBBB and LAFB Left ventricular hypertrophy No significant change since last tracing Confirmed by Wandra Arthurs [phone]) on 07/14/2022 6:39:01 PM  Radiology DG Chest 2 View  Result Date: 07/14/2022 CLINICAL DATA:  Fluid retention EXAM: CHEST - 2 VIEW COMPARISON:  CXR 05/24/08 FINDINGS: Bibasilar airspace opacities, which could represent atelectasis or infection. Normal cardiac and mediastinal contours. No radiographically apparent displaced rib fractures. Visualized upper abdomen is unremarkable. Vertebral body heights are maintained. IMPRESSION: No significant pulmonary  edema or pleural effusions. Electronically Signed   By: Marin Roberts M.D.   On: 07/14/2022 15:58    Procedures Procedures    Medications Ordered in ED Medications  furosemide (LASIX) injection 40 mg (40 mg Intravenous Given 07/14/22 1905)  morphine (PF) 4 MG/ML injection 4 mg (4 mg Intravenous Given 07/14/22 1905)  methocarbamol (ROBAXIN) tablet 500 mg (500 mg Oral Given 07/14/22 1906)    ED Course/  Medical Decision Making/ A&P                             Medical Decision Making Alexander Rich is a 89 y.o. male here presenting with hip pain and leg swelling.  Patient has chronic hip pain and was diagnosed with hip bursitis and is on a course of steroids.  Patient also has worsening leg swelling.  Patient is already on pain medicine.  Will get x-rays to rule out fracture.  Will also get CBC and CMP and BNP.  Patient likely will need some diuresis.  Patient states that he cannot walk and has nobody to take care of him at home.  7:48 PM Labs unremarkable.  X-rays did not show any fracture.  Patient still unable to walk despite pain medicine and diuretics.  I consulted hospitalist regarding admission  Problems Addressed: Lymphedema: acute illness or injury Pain of left hip: acute illness or injury  Amount and/or Complexity of Data Reviewed Labs: ordered. Decision-making details documented in ED Course. Radiology: ordered and independent interpretation performed. Decision-making details documented in ED Course.  Risk Prescription drug management. Decision regarding hospitalization.    Final Clinical Impression(s) / ED Diagnoses Final diagnoses:  None    Rx / DC Orders ED Discharge Orders     None         Drenda Freeze, MD 07/14/22 [phone]

## 2022-07-14 NOTE — ED Notes (Signed)
Patient requesting pain meds. Provider notified

## 2022-07-14 NOTE — Assessment & Plan Note (Signed)
-   Hemoglobin has improved from prior hospitalization - prior B12 level low normal; start on supplementation

## 2022-07-14 NOTE — Hospital Course (Addendum)
Mr. Drakos is a 89  yo male with PMH chronic left hip pain due to osteoarthritis, HTN, BPH, sinus brady, duodenal ulcer who presents with worsening left hip pain, increased swelling in bilateral legs, and difficulty ambulating due to this. He typically lives alone and is independent.  Due to worsening lower extremity edema and pain, he is now having much more difficulty completing typical ADLs.  He was dependent on his nephew to help him get to the hospital today for further evaluation.  He follows outpatient routinely with orthopedic surgery for routine left hip injections.  He also undergoes intermittent right shoulder injections as well. Last hospitalization in November 2023 when he presented for GI bleed, he also was able to undergo joint injection with Ortho prior to discharge to help with achieving adequate ambulation.  He was given a dose of IV Lasix which he responded well to with brisk diuresis.  Pain was treated with morphine and Robaxin.  His goal is to return to independent living status if able and is motivated for working with PT/OT also. Due to uncontrolled pain, he is admitted for further pain control and treatment of lower extremity edema. He underwent further workup including MRI left hip which was notable for bilateral hip degenerative changes but no fracture or AVN.  It did note degeneration of the labrum and being torn.  Findings were discussed with orthopedic surgery with no recommendations for inpatient steroid injection. From a pain standpoint, he had slow improvement of his pain during hospitalization and was able to ambulate more independently with PT by the end of hospitalization as well.  Recommendations remained patient discharging home with home health which was also arranged.  Other findings that were noted on MRI hip included scattered bone lesions and pelvic lymphadenopathy concerning for metastatic prostate cancer.  Prostatomegaly also noted and this was then followed by CT  chest/abdomen/pelvis and a nuc med bone scan. This further revealed signs of prostate cancer with nodal and bone metastasis notably L3 metastatic lesion.  Patient had no neurologic deficits nor signs of cord compression. Bladder scans showed low PVR and patient was voiding well.  Images did show bladder wall thickening concerning for outlet obstruction although he had no clinical signs. He was evaluated by oncology as he wished for some treatment.  He was started on Firmagon and Casodex.  He will continue outpatient follow-up with oncology.

## 2022-07-14 NOTE — Assessment & Plan Note (Addendum)
-   uncontrolled pain on admission and patient unable to ambulate due to this - Continue further pain control - PT/OT evaluation: Evaluated twice during hospitalization with no change in recommendations which included home health PT/OT -Discussed with orthopedic surgery. MRI L Hip reviewed too. No need for inpatient injection, he can follow-up outpatient as needed

## 2022-07-14 NOTE — ED Provider Triage Note (Signed)
Emergency Medicine Provider Triage Evaluation Note  Alexander Rich , a 89 y.o. male  was evaluated in triage.  Pt complains of left hip pain and bilateral leg swelling for several weeks. Follows with [facility] and was told he has bursitis of his left hip but says "they haven't told me what to do about it". Noted bilateral leg swelling to the level of the knees, reports this has been going on for several weeks. Reports taking lasix, but hasn't been consistent with it until this past week. Denies hx of CHF. Cannot remember his dose of lasix.   Review of Systems  Positive: Leg swelling, L hip pain Negative: Chest pain, SOB, abd distention  Physical Exam  BP 128/76 (BP Location: Right Arm)   Pulse 86   Temp 97.7 F (36.5 C) (Oral)   Resp 16   SpO2 100%  Gen:   Awake, no distress   Resp:  Normal effort  MSK:   Moves extremities without difficulty  Other:  2+ pitting edema to the bilateral legs up to the knees  Medical Decision Making  Medically screening exam initiated at 3:18 PM.  Appropriate orders placed.  Alexander Rich was informed that the remainder of the evaluation will be completed by another provider, this initial triage assessment does not replace that evaluation, and the importance of remaining in the ED until their evaluation is complete.  In chart review, prescription for lasix expired 2/20 and cardiology note shows "needs appt for refill". No echo's on file to review.    Alexander Rich T, PA-C 07/14/22 1520

## 2022-07-15 ENCOUNTER — Inpatient Hospital Stay (HOSPITAL_COMMUNITY): Payer: Medicare Other

## 2022-07-15 DIAGNOSIS — R600 Localized edema: Secondary | ICD-10-CM | POA: Diagnosis not present

## 2022-07-15 DIAGNOSIS — M1612 Unilateral primary osteoarthritis, left hip: Secondary | ICD-10-CM | POA: Diagnosis not present

## 2022-07-15 LAB — CBC WITH DIFFERENTIAL/PLATELET
Abs Immature Granulocytes: 0.03 10*3/uL (ref 0.00–0.07)
Basophils Absolute: 0 10*3/uL (ref 0.0–0.1)
Basophils Relative: 0 %
Eosinophils Absolute: 0 10*3/uL (ref 0.0–0.5)
Eosinophils Relative: 0 %
HCT: 40.1 % (ref 39.0–52.0)
Hemoglobin: 12.8 g/dL — ABNORMAL LOW (ref 13.0–17.0)
Immature Granulocytes: 0 %
Lymphocytes Relative: 22 %
Lymphs Abs: 1.9 10*3/uL (ref 0.7–4.0)
MCH: 31.5 pg (ref 26.0–34.0)
MCHC: 31.9 g/dL (ref 30.0–36.0)
MCV: 98.8 fL (ref 80.0–100.0)
Monocytes Absolute: 1 10*3/uL (ref 0.1–1.0)
Monocytes Relative: 11 %
Neutro Abs: 5.6 10*3/uL (ref 1.7–7.7)
Neutrophils Relative %: 67 %
Platelets: 180 10*3/uL (ref 150–400)
RBC: 4.06 MIL/uL — ABNORMAL LOW (ref 4.22–5.81)
RDW: 14.8 % (ref 11.5–15.5)
WBC: 8.6 10*3/uL (ref 4.0–10.5)
nRBC: 0 % (ref 0.0–0.2)

## 2022-07-15 LAB — BASIC METABOLIC PANEL
Anion gap: 11 (ref 5–15)
BUN: 16 mg/dL (ref 8–23)
CO2: 24 mmol/L (ref 22–32)
Calcium: 8.8 mg/dL — ABNORMAL LOW (ref 8.9–10.3)
Chloride: 101 mmol/L (ref 98–111)
Creatinine, Ser: 1.07 mg/dL (ref 0.61–1.24)
GFR, Estimated: >60 mL/min (ref >60)
Glucose, Bld: 115 mg/dL — ABNORMAL HIGH (ref 70–99)
Potassium: 3.5 mmol/L (ref 3.5–5.1)
Sodium: 136 mmol/L (ref 135–145)

## 2022-07-15 LAB — MAGNESIUM: Magnesium: 2.3 mg/dL (ref 1.7–2.4)

## 2022-07-15 MED ORDER — OXYCODONE-ACETAMINOPHEN 5-325 MG PO TABS
1.0000 | ORAL_TABLET | Freq: Four times a day (QID) | ORAL | Status: DC | PRN
  Administered 2022-07-15: 1 via ORAL
  Filled 2022-07-15 (×2): qty 1

## 2022-07-15 MED ORDER — ONDANSETRON HCL 4 MG/2ML IJ SOLN: 4.0000 mg | Freq: Four times a day (QID) | INTRAMUSCULAR | Status: DC | PRN

## 2022-07-15 MED ORDER — ACETAMINOPHEN 650 MG RE SUPP: 650.0000 mg | Freq: Four times a day (QID) | RECTAL | Status: DC | PRN

## 2022-07-15 MED ORDER — ENOXAPARIN SODIUM 40 MG/0.4ML IJ SOSY
40.0000 mg | PREFILLED_SYRINGE | Freq: Every day | INTRAMUSCULAR | Status: DC
  Administered 2022-07-16 – 2022-07-18 (×3): 40 mg via SUBCUTANEOUS
  Filled 2022-07-15 (×3): qty 0.4

## 2022-07-15 MED ORDER — MORPHINE SULFATE (PF) 2 MG/ML IV SOLN: 2.0000 mg | INTRAVENOUS | Status: DC | PRN

## 2022-07-15 MED ORDER — SODIUM CHLORIDE 0.9% FLUSH
3.0000 mL | Freq: Two times a day (BID) | INTRAVENOUS | Status: DC
  Administered 2022-07-15 – 2022-07-17 (×6): 3 mL via INTRAVENOUS

## 2022-07-15 MED ORDER — ACETAMINOPHEN 325 MG PO TABS: 650.0000 mg | ORAL_TABLET | Freq: Four times a day (QID) | ORAL | Status: DC | PRN

## 2022-07-15 MED ORDER — OXYCODONE-ACETAMINOPHEN 5-325 MG PO TABS
1.0000 | ORAL_TABLET | ORAL | Status: DC | PRN
  Administered 2022-07-17: 1 via ORAL
  Filled 2022-07-15: qty 1

## 2022-07-15 MED ORDER — TAMSULOSIN HCL 0.4 MG PO CAPS
0.4000 mg | ORAL_CAPSULE | Freq: Every day | ORAL | Status: DC
  Administered 2022-07-16 – 2022-07-18 (×3): 0.4 mg via ORAL
  Filled 2022-07-15 (×3): qty 1

## 2022-07-15 MED ORDER — ONDANSETRON HCL 4 MG PO TABS: 4.0000 mg | ORAL_TABLET | Freq: Four times a day (QID) | ORAL | Status: DC | PRN

## 2022-07-15 MED ORDER — LOSARTAN POTASSIUM 25 MG PO TABS
100.0000 mg | ORAL_TABLET | Freq: Every morning | ORAL | Status: DC
  Administered 2022-07-16 – 2022-07-18 (×3): 100 mg via ORAL
  Filled 2022-07-15 (×3): qty 4

## 2022-07-15 NOTE — Progress Notes (Signed)
Progress Note    Alexander Rich   [id number]  DOB: 07-01-1925  DOA: 07/14/2022     1 PCP: Antony Contras, MD  Initial CC: LE swelling, severe left hip pain  Hospital Course: Alexander Rich is a 89  yo male with PMH chronic left hip pain due to osteoarthritis, HTN, BPH, sinus brady, duodenal ulcer who presents with worsening left hip pain, increased swelling in bilateral legs, and difficulty ambulating due to this. He typically lives alone and is independent.  Due to worsening lower extremity edema and pain, he is now having much more difficulty completing typical ADLs.  He was dependent on his nephew to help him get to the hospital today for further evaluation.  He follows outpatient routinely with orthopedic surgery for routine left hip injections.  He also undergoes intermittent right shoulder injections as well. Last hospitalization in November 2023 when he presented for GI bleed, he also was able to undergo joint injection with Ortho prior to discharge to help with achieving adequate ambulation.  He was given a dose of IV Lasix which he responded well to with brisk diuresis.  Pain was treated with morphine and Robaxin.  His goal is to return to independent living status if able and is motivated for working with PT/OT also. Due to uncontrolled pain, he is admitted for further pain control and treatment of lower extremity edema.  Interval History:  No events overnight.  He is still having ongoing left hip pain which is his biggest complaint.  Some mild improvement in lower extremity edema but still rather prominent.  Assessment and Plan: * Bilateral lower extremity edema - Patient endorses dependent edema at end of the day however typically is resolved when waking up in the mornings.  Now notes that edema is persistent in the mornings -BNP 84.  Denies any PND, orthopnea, dyspnea -Other considered etiology includes medication side effect of amlodipine which will be held.  Does respond to IV  Lasix which will be continued - monitor strict I&O - may consider LE duplex depending on response - place TED hose  Primary localized osteoarthritis of left hip - uncontrolled pain on admission and patient unable to ambulate due to this - Continue further pain control - PT/OT evaluation -Discussed with orthopedic surgery.  Recommendation was for obtaining MRI left hip to rule out deeper injury and if negative, possible consideration for injection while patient in hospital  Essential hypertension - Hold amlodipine in setting of lower extremity edema - Continue losartan  Macrocytic anemia - Hemoglobin has improved from prior hospitalization - prior B12 level low normal; start on supplementation    Benign prostatic hyperplasia without urinary obstruction - Continue Flomax  History of duodenal ulcer - Continue Protonix   Old records reviewed in assessment of this patient  Antimicrobials:   DVT prophylaxis:  enoxaparin (LOVENOX) injection 40 mg Start: 07/15/22 1000 Place TED hose Start: 07/14/22 2122   Code Status:   Code Status: Full Code  Mobility Assessment (last 72 hours)     Mobility Assessment     Row Name 07/15/22 1149 07/15/22 02:59:30         Does patient have an order for bedrest or is patient medically unstable No - Continue assessment No - Continue assessment      What is the highest level of mobility based on the progressive mobility assessment? Level 5 (Walks with assist in room/hall) - Balance while stepping forward/back and can walk in room with assist - Complete --  Barriers to discharge:  Disposition Plan:  Pending PT/OT eval Status is: Inpt  Objective: Blood pressure (!) 150/70, pulse 87, temperature 97.9 F (36.6 C), temperature source Oral, resp. rate 17, weight 78 kg, SpO2 98 %.  Examination:  Physical Exam Constitutional:      General: He is not in acute distress.    Appearance: Normal appearance. He is not  ill-appearing.  HENT:     Head: Normocephalic and atraumatic.     Mouth/Throat:     Mouth: Mucous membranes are moist.  Eyes:     Extraocular Movements: Extraocular movements intact.  Cardiovascular:     Rate and Rhythm: Normal rate and regular rhythm.  Pulmonary:     Effort: Pulmonary effort is normal. No respiratory distress.     Breath sounds: Normal breath sounds. No wheezing.  Abdominal:     General: Bowel sounds are normal. There is no distension.     Palpations: Abdomen is soft.     Tenderness: There is no abdominal tenderness.  Musculoskeletal:        General: Swelling (3+ B/L LE pitting edema to knees) present.     Cervical back: Normal range of motion and neck supple.     Comments: Decreased passive ROM in left hip due to pain  Skin:    General: Skin is warm and dry.  Neurological:     General: No focal deficit present.     Mental Status: He is alert.  Psychiatric:        Mood and Affect: Mood normal.      Consultants:    Procedures:    Data Reviewed: Results for orders placed or performed during the hospital encounter of 07/14/22 (from the past 24 hour(s))  Basic metabolic panel     Status: Abnormal   Collection Time: 07/15/22  4:24 AM  Result Value Ref Range   Sodium 136 135 - 145 mmol/L   Potassium 3.5 3.5 - 5.1 mmol/L   Chloride 101 98 - 111 mmol/L   CO2 24 22 - 32 mmol/L   Glucose, Bld 115 (H) 70 - 99 mg/dL   BUN 16 8 - 23 mg/dL   Creatinine, Ser 1.07 0.61 - 1.24 mg/dL   Calcium 8.8 (L) 8.9 - 10.3 mg/dL   GFR, Estimated >60 >60 mL/min   Anion gap 11 5 - 15  CBC with Differential/Platelet     Status: Abnormal   Collection Time: 07/15/22  4:24 AM  Result Value Ref Range   WBC 8.6 4.0 - 10.5 K/uL   RBC 4.06 (L) 4.22 - 5.81 MIL/uL   Hemoglobin 12.8 (L) 13.0 - 17.0 g/dL   HCT 40.1 39.0 - 52.0 %   MCV 98.8 80.0 - 100.0 fL   MCH 31.5 26.0 - 34.0 pg   MCHC 31.9 30.0 - 36.0 g/dL   RDW 14.8 11.5 - 15.5 %   Platelets 180 150 - 400 K/uL   nRBC 0.0 0.0  - 0.2 %   Neutrophils Relative % 67 %   Neutro Abs 5.6 1.7 - 7.7 K/uL   Lymphocytes Relative 22 %   Lymphs Abs 1.9 0.7 - 4.0 K/uL   Monocytes Relative 11 %   Monocytes Absolute 1.0 0.1 - 1.0 K/uL   Eosinophils Relative 0 %   Eosinophils Absolute 0.0 0.0 - 0.5 K/uL   Basophils Relative 0 %   Basophils Absolute 0.0 0.0 - 0.1 K/uL   Immature Granulocytes 0 %   Abs Immature Granulocytes 0.03 0.00 - 0.07  K/uL  Magnesium     Status: None   Collection Time: 07/15/22  4:24 AM  Result Value Ref Range   Magnesium 2.3 1.7 - 2.4 mg/dL    I have reviewed pertinent nursing notes, vitals, labs, and images as necessary. I have ordered labwork to follow up on as indicated.  I have reviewed the last notes from staff over past 24 hours. I have discussed patient's care plan and test results with nursing staff, CM/SW, and other staff as appropriate.  Time spent: Greater than 50% of the 55 minute visit was spent in counseling/coordination of care for the patient as laid out in the A&P.   LOS: 1 day   Dwyane Dee, MD [facility] 07/15/2022, 6:37 PM

## 2022-07-15 NOTE — ED Notes (Signed)
ED TO INPATIENT HANDOFF REPORT  ED Nurse Name and Phone #: Anh Bigos, Paramedic  S Name/Age/Gender Alexander Rich 89 y.o. male Room/Bed: WA11/WA11  Code Status   Code Status: Full Code  Home/SNF/Other    Is this baseline? Yes   Triage Complete: Triage complete  Chief Complaint Bilateral lower extremity edema [R60.0]  Triage Note Pt reports left hip pain radiating down leg.  Left lower extremity swelling.  Pt reports on lasix and hasn't missed any doses.  Told by orthopedic doctor had Bursitis.  Denies sob.    Allergies No Known Allergies  Level of Care/Admitting Diagnosis ED Disposition     ED Disposition  Admit   Condition  --   Comment  Hospital Area: [facility] [[id number]]  Level of Care: Med-Surg [16]  May admit patient to [facility] or [facility] if equivalent level of care is available:: No  Covid Evaluation: Asymptomatic - no recent exposure (last 10 days) testing not required  Diagnosis: Bilateral lower extremity edema [id number]  Admitting Physician: Dwyane Dee [phone]  Attending Physician: Dwyane Dee [id number]  Certification:: I certify this patient will need inpatient services for at least 2 midnights  Estimated Length of Stay: 2          B Medical/Surgery History Past Medical History:  Diagnosis Date   Constipation    Hip pain    Hyperpigmented skin lesions 03/21/2022   Hypertension    Past Surgical History:  Procedure Laterality Date   BACK SURGERY     ESOPHAGOGASTRODUODENOSCOPY N/A 03/24/2022   Procedure: ESOPHAGOGASTRODUODENOSCOPY (EGD);  Surgeon: Arta Silence, MD;  Location: [facility];  Service: Gastroenterology;  Laterality: N/A;     A IV Location/Drains/Wounds Patient Lines/Drains/Airways Status     Active Line/Drains/Airways     Name Placement date Placement time Site Days   Peripheral IV 07/14/22 22 G Right Antecubital 07/14/22  1905  Antecubital  1            Intake/Output Last 24  hours No intake or output data in the 24 hours ending 07/15/22 1019  Labs/Imaging Results for orders placed or performed during the hospital encounter of 07/14/22 (from the past 48 hour(s))  CBC     Status: Abnormal   Collection Time: 07/14/22  3:38 PM  Result Value Ref Range   WBC 7.1 4.0 - 10.5 K/uL   RBC 3.90 (L) 4.22 - 5.81 MIL/uL   Hemoglobin 12.3 (L) 13.0 - 17.0 g/dL   HCT 38.6 (L) 39.0 - 52.0 %   MCV 99.0 80.0 - 100.0 fL   MCH 31.5 26.0 - 34.0 pg   MCHC 31.9 30.0 - 36.0 g/dL   RDW 15.0 11.5 - 15.5 %   Platelets 181 150 - 400 K/uL   nRBC 0.0 0.0 - 0.2 %    Comment: Performed at [facility], [place] [address] [place], [place] [address]  Comprehensive metabolic panel     Status: Abnormal   Collection Time: 07/14/22  3:38 PM  Result Value Ref Range   Sodium 135 135 - 145 mmol/L   Potassium 4.0 3.5 - 5.1 mmol/L   Chloride 102 98 - 111 mmol/L   CO2 25 22 - 32 mmol/L   Glucose, Bld 109 (H) 70 - 99 mg/dL    Comment: Glucose reference range applies only to samples taken after fasting for at least 8 hours.   BUN 17 8 - 23 mg/dL   Creatinine, Ser 1.08 0.61 - 1.24 mg/dL  Calcium 8.5 (L) 8.9 - 10.3 mg/dL   Total Protein 6.4 (L) 6.5 - 8.1 g/dL   Albumin 3.6 3.5 - 5.0 g/dL   AST 25 15 - 41 U/L   ALT 17 0 - 44 U/L   Alkaline Phosphatase 47 38 - 126 U/L   Total Bilirubin 0.9 0.3 - 1.2 mg/dL   GFR, Estimated >60 >60 mL/min    Comment: (NOTE) Calculated using the CKD-EPI Creatinine Equation (2021)    Anion gap 8 5 - 15    Comment: Performed at [facility], [place] [address] [place], [place] [address]  Brain natriuretic peptide     Status: None   Collection Time: 07/14/22  3:38 PM  Result Value Ref Range   B Natriuretic Peptide 84.0 0.0 - 100.0 pg/mL    Comment: Performed at [facility], [place] [address] [place], [place] [id number]  Basic metabolic panel     Status: Abnormal   Collection Time: 07/15/22  4:24 AM  Result Value  Ref Range   Sodium 136 135 - 145 mmol/L   Potassium 3.5 3.5 - 5.1 mmol/L   Chloride 101 98 - 111 mmol/L   CO2 24 22 - 32 mmol/L   Glucose, Bld 115 (H) 70 - 99 mg/dL    Comment: Glucose reference range applies only to samples taken after fasting for at least 8 hours.   BUN 16 8 - 23 mg/dL   Creatinine, Ser 1.07 0.61 - 1.24 mg/dL   Calcium 8.8 (L) 8.9 - 10.3 mg/dL   GFR, Estimated >60 >60 mL/min    Comment: (NOTE) Calculated using the CKD-EPI Creatinine Equation (2021)    Anion gap 11 5 - 15    Comment: Performed at [facility], [place] [address], [place], [place] [address]  CBC with Differential/Platelet     Status: Abnormal   Collection Time: 07/15/22  4:24 AM  Result Value Ref Range   WBC 8.6 4.0 - 10.5 K/uL   RBC 4.06 (L) 4.22 - 5.81 MIL/uL   Hemoglobin 12.8 (L) 13.0 - 17.0 g/dL   HCT 40.1 39.0 - 52.0 %   MCV 98.8 80.0 - 100.0 fL   MCH 31.5 26.0 - 34.0 pg   MCHC 31.9 30.0 - 36.0 g/dL   RDW 14.8 11.5 - 15.5 %   Platelets 180 150 - 400 K/uL   nRBC 0.0 0.0 - 0.2 %   Neutrophils Relative % 67 %   Neutro Abs 5.6 1.7 - 7.7 K/uL   Lymphocytes Relative 22 %   Lymphs Abs 1.9 0.7 - 4.0 K/uL   Monocytes Relative 11 %   Monocytes Absolute 1.0 0.1 - 1.0 K/uL   Eosinophils Relative 0 %   Eosinophils Absolute 0.0 0.0 - 0.5 K/uL   Basophils Relative 0 %   Basophils Absolute 0.0 0.0 - 0.1 K/uL   Immature Granulocytes 0 %   Abs Immature Granulocytes 0.03 0.00 - 0.07 K/uL    Comment: Performed at [facility], [place] [address] [place], [place] [address]  Magnesium     Status: None   Collection Time: 07/15/22  4:24 AM  Result Value Ref Range   Magnesium 2.3 1.7 - 2.4 mg/dL    Comment: Performed at [facility], [place] [address] [place], [place] [address]   DG Hip Malvin Johns or Wo Pelvis 2-3 Views Left  Result Date: 07/14/2022 CLINICAL DATA:  Left hip pain EXAM: DG HIP (WITH OR WITHOUT PELVIS) 2-3V  LEFT COMPARISON:  None Available.  FINDINGS: Bones are osteopenic. There is no evidence of hip fracture or dislocation. Hip joint spaces are maintained. There are degenerative changes of the lower lumbar spine. IMPRESSION: 1. No acute fracture or dislocation. 2. Osteopenia. Electronically Signed   By: Ronney Asters M.D.   On: 07/14/2022 19:19   DG Chest 2 View  Result Date: 07/14/2022 CLINICAL DATA:  Fluid retention EXAM: CHEST - 2 VIEW COMPARISON:  CXR 05/24/08 FINDINGS: Bibasilar airspace opacities, which could represent atelectasis or infection. Normal cardiac and mediastinal contours. No radiographically apparent displaced rib fractures. Visualized upper abdomen is unremarkable. Vertebral body heights are maintained. IMPRESSION: No significant pulmonary edema or pleural effusions. Electronically Signed   By: Marin Roberts M.D.   On: 07/14/2022 15:58    Pending Labs Unresulted Labs (From admission, onward)     Start     Ordered   07/15/22 [id number]  Basic metabolic panel  Rich,   R     Question:  Specimen collection method  Answer:  Lab=Lab collect   07/14/22 2142   07/15/22 0500  CBC with Differential/Platelet  Rich,   R     Question:  Specimen collection method  Answer:  Lab=Lab collect   07/14/22 2142   07/15/22 0500  Magnesium  Rich,   R     Question:  Specimen collection method  Answer:  Lab=Lab collect   07/14/22 2142            Vitals/Pain Today's Vitals   07/15/22 0500 07/15/22 0600 07/15/22 0700 07/15/22 0807  BP: (!) 93/56 (!) 106/52 117/67 131/89  Pulse: 99 95 88 91  Resp:    18  Temp:    97.9 F (36.6 C)  TempSrc:    Oral  SpO2: 96% 97% 97% 100%  Weight:      PainSc:        Isolation Precautions No active isolations  Medications Medications  losartan (COZAAR) tablet 100 mg (has no administration in time range)  oxyCODONE-acetaminophen (PERCOCET/ROXICET) 5-325 MG per tablet 1 tablet (has no administration in time range)  tamsulosin (FLOMAX) capsule 0.4 mg (has no administration in time range)   enoxaparin (LOVENOX) injection 40 mg (has no administration in time range)  sodium chloride flush (NS) 0.9 % injection 3 mL (3 mLs Intravenous Given 07/15/22 1011)  acetaminophen (TYLENOL) tablet 650 mg (has no administration in time range)    Or  acetaminophen (TYLENOL) suppository 650 mg (has no administration in time range)  morphine (PF) 2 MG/ML injection 2 mg (has no administration in time range)  ondansetron (ZOFRAN) tablet 4 mg (has no administration in time range)    Or  ondansetron (ZOFRAN) injection 4 mg (has no administration in time range)  furosemide (LASIX) injection 40 mg (40 mg Intravenous Given 07/15/22 1007)  cyanocobalamin (VITAMIN B12) tablet 1,000 mcg (1,000 mcg Oral Given 07/15/22 1010)  pantoprazole (PROTONIX) EC tablet 40 mg (40 mg Oral Given 07/15/22 1010)  furosemide (LASIX) injection 40 mg (40 mg Intravenous Given 07/14/22 1905)  morphine (PF) 4 MG/ML injection 4 mg (4 mg Intravenous Given 07/14/22 1905)  methocarbamol (ROBAXIN) tablet 500 mg (500 mg Oral Given 07/14/22 1906)    Mobility manual wheelchair     Focused Assessments     R Recommendations: See Admitting Provider Note  Report given to:   Additional Notes: Pt has meds not verified yet by Pharmacy

## 2022-07-16 DIAGNOSIS — N400 Enlarged prostate without lower urinary tract symptoms: Secondary | ICD-10-CM | POA: Insufficient documentation

## 2022-07-16 DIAGNOSIS — M899 Disorder of bone, unspecified: Secondary | ICD-10-CM

## 2022-07-16 DIAGNOSIS — C61 Malignant neoplasm of prostate: Secondary | ICD-10-CM

## 2022-07-16 DIAGNOSIS — M1612 Unilateral primary osteoarthritis, left hip: Secondary | ICD-10-CM | POA: Diagnosis not present

## 2022-07-16 DIAGNOSIS — M25552 Pain in left hip: Secondary | ICD-10-CM | POA: Diagnosis not present

## 2022-07-16 DIAGNOSIS — R600 Localized edema: Secondary | ICD-10-CM | POA: Diagnosis not present

## 2022-07-16 DIAGNOSIS — N429 Disorder of prostate, unspecified: Secondary | ICD-10-CM

## 2022-07-16 DIAGNOSIS — Z87891 Personal history of nicotine dependence: Secondary | ICD-10-CM

## 2022-07-16 LAB — BASIC METABOLIC PANEL
Anion gap: 12 (ref 5–15)
BUN: 16 mg/dL (ref 8–23)
CO2: 26 mmol/L (ref 22–32)
Calcium: 8.7 mg/dL — ABNORMAL LOW (ref 8.9–10.3)
Chloride: 101 mmol/L (ref 98–111)
Creatinine, Ser: 1.16 mg/dL (ref 0.61–1.24)
GFR, Estimated: 58 mL/min — ABNORMAL LOW (ref >60)
Glucose, Bld: 116 mg/dL — ABNORMAL HIGH (ref 70–99)
Potassium: 3.3 mmol/L — ABNORMAL LOW (ref 3.5–5.1)
Sodium: 139 mmol/L (ref 135–145)

## 2022-07-16 LAB — CBC WITH DIFFERENTIAL/PLATELET
Abs Immature Granulocytes: 0.02 10*3/uL (ref 0.00–0.07)
Basophils Absolute: 0 10*3/uL (ref 0.0–0.1)
Basophils Relative: 1 %
Eosinophils Absolute: 0 10*3/uL (ref 0.0–0.5)
Eosinophils Relative: 1 %
HCT: 35 % — ABNORMAL LOW (ref 39.0–52.0)
Hemoglobin: 11.3 g/dL — ABNORMAL LOW (ref 13.0–17.0)
Immature Granulocytes: 0 %
Lymphocytes Relative: 30 %
Lymphs Abs: 1.8 10*3/uL (ref 0.7–4.0)
MCH: 31.7 pg (ref 26.0–34.0)
MCHC: 32.3 g/dL (ref 30.0–36.0)
MCV: 98.3 fL (ref 80.0–100.0)
Monocytes Absolute: 1 10*3/uL (ref 0.1–1.0)
Monocytes Relative: 17 %
Neutro Abs: 3.2 10*3/uL (ref 1.7–7.7)
Neutrophils Relative %: 51 %
Platelets: 155 10*3/uL (ref 150–400)
RBC: 3.56 MIL/uL — ABNORMAL LOW (ref 4.22–5.81)
RDW: 14.8 % (ref 11.5–15.5)
WBC: 6.2 10*3/uL (ref 4.0–10.5)
nRBC: 0 % (ref 0.0–0.2)

## 2022-07-16 LAB — MAGNESIUM: Magnesium: 2.2 mg/dL (ref 1.7–2.4)

## 2022-07-16 MED ORDER — POTASSIUM CHLORIDE CRYS ER 20 MEQ PO TBCR
40.0000 meq | EXTENDED_RELEASE_TABLET | Freq: Once | ORAL | Status: AC
  Administered 2022-07-16: 40 meq via ORAL
  Filled 2022-07-16: qty 2

## 2022-07-16 NOTE — Consult Note (Signed)
This is a consult note for [employer].  He is a very nice 89 year old veteran.  He was in the [employer].  He served in between [place].  He is from [place].  He was admitted on 07/14/2022.  He was having severe left hip pain.  He is having lower extremity swelling.  He initially had plain x-rays done.  This showed degenerative changes.  He subsequently had an MRI that was done.  The MRI showed some prostatomegaly.  He has some scattered bony lesions.  There was some adenopathy.  He had age-related degenerative changes in the left hip.  There is no obvious fracture.  He says he urinates quite often.  He is noted that his penis is shriveled up.  He has had leg swelling.  He had Dopplers done which were negative for any pulm embolic disease.  He has a history of recent GI bleeding back in November.  This may have been from nonsteroidal use.  His labs today show a sodium 139.  Potassium 3.3.  BUN 16 creatinine 1.16.  Calcium 8.7 with an albumin of 3.6.  His white cell count 6.2.  Hemoglobin is 11.3.  Platelet count 155,000.  He is 89 years old so I am sure has prostate cancer.  I think the question is how extensive it is this.  The PSA can help [place] out.  He used to smoke.  He stopped over 50 years ago.  He has rare alcohol use.  He does not recall ever being on any supplemental testosterone.   On his physical exam, his temperature is 97.9.  Pulse 96.  Blood pressure 116/65.  His weight is 171 pounds.  His head and neck exam shows no ocular or oral lesions.  He has no palpable cervical or supraclavicular lymph nodes.  There is no palpable thyroid.  Lungs are clear bilaterally.  Cardiac exam regular rate and rhythm.  He has no murmurs, rubs or bruits.  Abdomen is soft.  He has good bowel sounds.  There is no fluid wave.  There is no palpable liver or spleen tip.  Extremities does show 2+ edema in his legs.  He has some tenderness in the left hip to palpation.  He has decreased range of  motion of the left hip.  Neurological exam shows no focal neurological deficits.   Mr. Paco is a nice 89 year old white male.  I am sure that he probably has prostate cancer.  Somehow, he thinks he is somehow, he thinks he is going to have surgery for his prostate.  I told him that surgery is not an option for him.  We will have to see what the PSA is.  He needs to have a bone scan done.  He needs to have a CT scan to assess for his prostate and for any lymphadenopathy in bone lesions.  I would think that he should do very well with antiandrogen therapy.  Given that he is 89 result, we really have to keep everything quite simple.  If we do document a very high PSA, I probably would just give him an [facility] agonist.  I would also then put him on a oral antiandrogen.  He clearly is not candidate for any chemotherapy.  I would only consider chemotherapy if he has an incredibly extensive tumor burden which I doubt.  His main issue is his hip.  I do not know if he can have this fixed.  He has a torn labrum.  We will help follow along.  He may be a candidate for bisphosphonate therapy depending on what the bone scan and CT scan shows.  Again, I thanked him for service to our country.   Lattie Haw, MD  Lurena Joiner 1:37

## 2022-07-16 NOTE — Care Management Important Message (Signed)
Important Message  Patient Details  Name: Alexander Rich MRN: [id number] Date of Birth: 01/26/1926   Medicare Important Message Given:  Yes     Memory Argue 07/16/2022, 12:15 PM

## 2022-07-16 NOTE — Progress Notes (Signed)
Progress Note    Alexander Rich   [id number]  DOB: 1925-05-22  DOA: 07/14/2022     2 PCP: Antony Contras, MD  Initial CC: LE swelling, severe left hip pain  Hospital Course: Alexander Rich is a 89  yo male with PMH chronic left hip pain due to osteoarthritis, HTN, BPH, sinus brady, duodenal ulcer who presents with worsening left hip pain, increased swelling in bilateral legs, and difficulty ambulating due to this. He typically lives alone and is independent.  Due to worsening lower extremity edema and pain, he is now having much more difficulty completing typical ADLs.  He was dependent on his nephew to help him get to the hospital today for further evaluation.  He follows outpatient routinely with orthopedic surgery for routine left hip injections.  He also undergoes intermittent right shoulder injections as well. Last hospitalization in November 2023 when he presented for GI bleed, he also was able to undergo joint injection with Ortho prior to discharge to help with achieving adequate ambulation.  He was given a dose of IV Lasix which he responded well to with brisk diuresis.  Pain was treated with morphine and Robaxin.  His goal is to return to independent living status if able and is motivated for working with PT/OT also. Due to uncontrolled pain, he is admitted for further pain control and treatment of lower extremity edema.  Interval History:  No events overnight.  Reviewed MRI findings with patient this morning.  He states he would want oncology evaluation and treatment if offered.  He endorses his pain is improved today as well.  Assessment and Plan: * Bilateral lower extremity edema - Patient endorses dependent edema at end of the day however typically is resolved when waking up in the mornings.  Now notes that edema is persistent in the mornings -BNP 84.  Denies any PND, orthopnea, dyspnea -Other considered etiology includes medication side effect of amlodipine which will be held.   Does respond to IV Lasix which will be continued - monitor strict I&O - may consider LE duplex depending on response - place TED hose  Primary localized osteoarthritis of left hip - uncontrolled pain on admission and patient unable to ambulate due to this - Continue further pain control - PT/OT evaluation -Discussed with orthopedic surgery. MRI L Hip reviewed too. No need for inpatient injection, he can follow-up outpatient as needed  Prostate enlargement - MRI was obtained to better evaluate left hip but showed prostatomegaly with concern for prostate cancer and also noted to have scattered bone lesions worrisome for metastatic disease.  There is a lesion involving left acetabulum but no pathologic fracture and a large lesion involving L3 vertebral body.  Pelvic lymphadenopathy also appreciated - Discussed findings with patient and he would be interested in at least oncology evaluation as he is independent at home and states he would want treatment if offered - await further oncology evaluation and rec's  Essential hypertension - Hold amlodipine in setting of lower extremity edema - Continue losartan  Macrocytic anemia - Hemoglobin has improved from prior hospitalization - prior B12 level low normal; start on supplementation    History of duodenal ulcer - Continue Protonix   Old records reviewed in assessment of this patient  Antimicrobials:   DVT prophylaxis:  enoxaparin (LOVENOX) injection 40 mg Start: 07/15/22 1000 Place TED hose Start: 07/14/22 2122   Code Status:   Code Status: Full Code  Mobility Assessment (last 72 hours)     Mobility Assessment  Alexander Rich 07/16/22 0809 07/15/22 2131 07/15/22 1149 07/15/22 02:59:30     Does patient have an order for bedrest or is patient medically unstable No - Continue assessment No - Continue assessment No - Continue assessment No - Continue assessment    What is the highest level of mobility based on the progressive  mobility assessment? Level 5 (Walks with assist in room/hall) - Balance while stepping forward/back and can walk in room with assist - Complete Level 5 (Walks with assist in room/hall) - Balance while stepping forward/back and can walk in room with assist - Complete Level 5 (Walks with assist in room/hall) - Balance while stepping forward/back and can walk in room with assist - Complete --             Barriers to discharge:  Disposition Plan:  Pending PT/OT eval Status is: Inpt  Objective: Blood pressure 106/65, pulse 96, temperature 97.9 F (36.6 C), temperature source Oral, resp. rate 17, weight 78 kg, SpO2 99 %.  Examination:  Physical Exam Constitutional:      General: He is not in acute distress.    Appearance: Normal appearance. He is not ill-appearing.  HENT:     Head: Normocephalic and atraumatic.     Mouth/Throat:     Mouth: Mucous membranes are moist.  Eyes:     Extraocular Movements: Extraocular movements intact.  Cardiovascular:     Rate and Rhythm: Normal rate and regular rhythm.  Pulmonary:     Effort: Pulmonary effort is normal. No respiratory distress.     Breath sounds: Normal breath sounds. No wheezing.  Abdominal:     General: Bowel sounds are normal. There is no distension.     Palpations: Abdomen is soft.     Tenderness: There is no abdominal tenderness.  Musculoskeletal:        General: Swelling (3+ B/L LE edema has been slowly improving daily) present.     Cervical back: Normal range of motion and neck supple.     Comments: Decreased passive ROM in left hip due to pain  Skin:    General: Skin is warm and dry.  Neurological:     General: No focal deficit present.     Mental Status: He is alert.  Psychiatric:        Mood and Affect: Mood normal.      Consultants:    Procedures:    Data Reviewed: Results for orders placed or performed during the hospital encounter of 07/14/22 (from the past 24 hour(s))  Basic metabolic panel     Status:  Abnormal   Collection Time: 07/16/22  4:43 AM  Result Value Ref Range   Sodium 139 135 - 145 mmol/L   Potassium 3.3 (L) 3.5 - 5.1 mmol/L   Chloride 101 98 - 111 mmol/L   CO2 26 22 - 32 mmol/L   Glucose, Bld 116 (H) 70 - 99 mg/dL   BUN 16 8 - 23 mg/dL   Creatinine, Ser 1.16 0.61 - 1.24 mg/dL   Calcium 8.7 (L) 8.9 - 10.3 mg/dL   GFR, Estimated 58 (L) >60 mL/min   Anion gap 12 5 - 15  CBC with Differential/Platelet     Status: Abnormal   Collection Time: 07/16/22  4:43 AM  Result Value Ref Range   WBC 6.2 4.0 - 10.5 K/uL   RBC 3.56 (L) 4.22 - 5.81 MIL/uL   Hemoglobin 11.3 (L) 13.0 - 17.0 g/dL   HCT 35.0 (L) 39.0 - 52.0 %  MCV 98.3 80.0 - 100.0 fL   MCH 31.7 26.0 - 34.0 pg   MCHC 32.3 30.0 - 36.0 g/dL   RDW 14.8 11.5 - 15.5 %   Platelets 155 150 - 400 K/uL   nRBC 0.0 0.0 - 0.2 %   Neutrophils Relative % 51 %   Neutro Abs 3.2 1.7 - 7.7 K/uL   Lymphocytes Relative 30 %   Lymphs Abs 1.8 0.7 - 4.0 K/uL   Monocytes Relative 17 %   Monocytes Absolute 1.0 0.1 - 1.0 K/uL   Eosinophils Relative 1 %   Eosinophils Absolute 0.0 0.0 - 0.5 K/uL   Basophils Relative 1 %   Basophils Absolute 0.0 0.0 - 0.1 K/uL   Immature Granulocytes 0 %   Abs Immature Granulocytes 0.02 0.00 - 0.07 K/uL  Magnesium     Status: None   Collection Time: 07/16/22  4:43 AM  Result Value Ref Range   Magnesium 2.2 1.7 - 2.4 mg/dL    I have reviewed pertinent nursing notes, vitals, labs, and images as necessary. I have ordered labwork to follow up on as indicated.  I have reviewed the last notes from staff over past 24 hours. I have discussed patient's care plan and test results with nursing staff, CM/SW, and other staff as appropriate.  Time spent: Greater than 50% of the 55 minute visit was spent in counseling/coordination of care for the patient as laid out in the A&P.   LOS: 2 days   Dwyane Dee, MD [facility] 07/16/2022, 2:25 PM

## 2022-07-16 NOTE — Assessment & Plan Note (Deleted)
-   MRI was obtained to better evaluate left hip but showed prostatomegaly with concern for prostate cancer and also noted to have scattered bone lesions worrisome for metastatic disease.  There is a lesion involving left acetabulum but no pathologic fracture and a large lesion involving L3 vertebral body.  Pelvic lymphadenopathy also appreciated - Discussed findings with patient and he would be interested in at least oncology evaluation as he is independent at home and states he would want treatment if offered - await further oncology evaluation and rec's

## 2022-07-17 ENCOUNTER — Inpatient Hospital Stay (HOSPITAL_COMMUNITY): Payer: Medicare Other

## 2022-07-17 ENCOUNTER — Encounter (HOSPITAL_COMMUNITY): Payer: Self-pay | Admitting: Internal Medicine

## 2022-07-17 DIAGNOSIS — C61 Malignant neoplasm of prostate: Secondary | ICD-10-CM | POA: Diagnosis not present

## 2022-07-17 DIAGNOSIS — R600 Localized edema: Secondary | ICD-10-CM | POA: Diagnosis not present

## 2022-07-17 DIAGNOSIS — C7951 Secondary malignant neoplasm of bone: Secondary | ICD-10-CM | POA: Diagnosis not present

## 2022-07-17 DIAGNOSIS — M899 Disorder of bone, unspecified: Secondary | ICD-10-CM | POA: Diagnosis not present

## 2022-07-17 DIAGNOSIS — M25552 Pain in left hip: Secondary | ICD-10-CM | POA: Diagnosis not present

## 2022-07-17 DIAGNOSIS — M1612 Unilateral primary osteoarthritis, left hip: Secondary | ICD-10-CM | POA: Diagnosis not present

## 2022-07-17 HISTORY — DX: Secondary malignant neoplasm of bone: C61

## 2022-07-17 LAB — BASIC METABOLIC PANEL
Anion gap: 10 (ref 5–15)
BUN: 18 mg/dL (ref 8–23)
CO2: 27 mmol/L (ref 22–32)
Calcium: 8.4 mg/dL — ABNORMAL LOW (ref 8.9–10.3)
Chloride: 99 mmol/L (ref 98–111)
Creatinine, Ser: 1.16 mg/dL (ref 0.61–1.24)
GFR, Estimated: 58 mL/min — ABNORMAL LOW (ref >60)
Glucose, Bld: 129 mg/dL — ABNORMAL HIGH (ref 70–99)
Potassium: 3.2 mmol/L — ABNORMAL LOW (ref 3.5–5.1)
Sodium: 136 mmol/L (ref 135–145)

## 2022-07-17 LAB — CBC WITH DIFFERENTIAL/PLATELET
Abs Immature Granulocytes: 0.02 10*3/uL (ref 0.00–0.07)
Basophils Absolute: 0 10*3/uL (ref 0.0–0.1)
Basophils Relative: 0 %
Eosinophils Absolute: 0 10*3/uL (ref 0.0–0.5)
Eosinophils Relative: 1 %
HCT: 33.6 % — ABNORMAL LOW (ref 39.0–52.0)
Hemoglobin: 10.9 g/dL — ABNORMAL LOW (ref 13.0–17.0)
Immature Granulocytes: 0 %
Lymphocytes Relative: 30 %
Lymphs Abs: 1.9 10*3/uL (ref 0.7–4.0)
MCH: 31.8 pg (ref 26.0–34.0)
MCHC: 32.4 g/dL (ref 30.0–36.0)
MCV: 98 fL (ref 80.0–100.0)
Monocytes Absolute: 0.9 10*3/uL (ref 0.1–1.0)
Monocytes Relative: 15 %
Neutro Abs: 3.5 10*3/uL (ref 1.7–7.7)
Neutrophils Relative %: 54 %
Platelets: 146 10*3/uL — ABNORMAL LOW (ref 150–400)
RBC: 3.43 MIL/uL — ABNORMAL LOW (ref 4.22–5.81)
RDW: 15 % (ref 11.5–15.5)
WBC: 6.4 10*3/uL (ref 4.0–10.5)
nRBC: 0 % (ref 0.0–0.2)

## 2022-07-17 LAB — IRON AND TIBC
Iron: 53 ug/dL (ref 45–182)
Saturation Ratios: 18 % (ref 17.9–39.5)
TIBC: 290 ug/dL (ref 250–450)
UIBC: 237 ug/dL

## 2022-07-17 LAB — MAGNESIUM: Magnesium: 2.2 mg/dL (ref 1.7–2.4)

## 2022-07-17 LAB — PSA: Prostatic Specific Antigen: 587.81 ng/mL — ABNORMAL HIGH (ref 0.00–4.00)

## 2022-07-17 LAB — FERRITIN: Ferritin: 45 ng/mL (ref 24–336)

## 2022-07-17 MED ORDER — BICALUTAMIDE 50 MG PO TABS
50.0000 mg | ORAL_TABLET | Freq: Every day | ORAL | Status: DC
  Administered 2022-07-17 – 2022-07-18 (×2): 50 mg via ORAL
  Filled 2022-07-17 (×2): qty 1

## 2022-07-17 MED ORDER — DEGARELIX ACETATE(240 MG DOSE) 120 MG/VIAL ~~LOC~~ SOLR
240.0000 mg | Freq: Once | SUBCUTANEOUS | Status: AC
  Administered 2022-07-17: 240 mg via SUBCUTANEOUS
  Filled 2022-07-17: qty 6

## 2022-07-17 MED ORDER — SODIUM CHLORIDE (PF) 0.9 % IJ SOLN
INTRAMUSCULAR | Status: AC
  Filled 2022-07-17: qty 50

## 2022-07-17 MED ORDER — TECHNETIUM TC 99M MEDRONATE IV KIT
20.0000 | PACK | Freq: Once | INTRAVENOUS | Status: AC | PRN
  Administered 2022-07-17: 21.8 via INTRAVENOUS

## 2022-07-17 MED ORDER — IOHEXOL 9 MG/ML PO SOLN
ORAL | Status: AC
  Filled 2022-07-17: qty 1000

## 2022-07-17 MED ORDER — FUROSEMIDE 10 MG/ML IJ SOLN
40.0000 mg | Freq: Every day | INTRAMUSCULAR | Status: DC
  Administered 2022-07-18: 40 mg via INTRAVENOUS
  Filled 2022-07-17: qty 4

## 2022-07-17 MED ORDER — IOHEXOL 9 MG/ML PO SOLN
1000.0000 mL | ORAL | Status: AC
  Administered 2022-07-17: 1000 mL via ORAL

## 2022-07-17 MED ORDER — ZOLEDRONIC ACID 4 MG/5ML IV CONC: 4.0000 mg | Freq: Once | INTRAVENOUS | Status: DC

## 2022-07-17 MED ORDER — POTASSIUM CHLORIDE CRYS ER 20 MEQ PO TBCR
40.0000 meq | EXTENDED_RELEASE_TABLET | Freq: Once | ORAL | Status: AC
  Administered 2022-07-17: 40 meq via ORAL
  Filled 2022-07-17: qty 2

## 2022-07-17 MED ORDER — IOHEXOL 300 MG/ML  SOLN
100.0000 mL | Freq: Once | INTRAMUSCULAR | Status: AC | PRN
  Administered 2022-07-17: 100 mL via INTRAVENOUS

## 2022-07-17 NOTE — Evaluation (Signed)
Occupational Therapy Evaluation Patient Details Name: AKING SHULMAN MRN: [id number] DOB: 1925-06-07 Today's Date: 07/17/2022   History of Present Illness 89 yo male admitted to hospital on 3/12 due to B LE edema and L hip pain impacting pt functional mobility in home setting. L hip x-ray negative for acute findings, MRI revealed bony lesions and prostatemegaly and concern for prostate ca. Pt PMH includes but is not limited to OA, HTN, BPH, sinus bradycardia, duodenal ulcer, HTN, anemia. Pt started on IV lasix, will follow up OP Orthopedic for L hip pain and pending CT today.   Clinical Impression   Mr. Jakari Jean is a 89 year old man who presented to hospital with pain. On evaluation he reported no pain and was able to perform his baseline ADLs and ambulate with walker. He has difficulty with donning socks at baseline and typically doesn't wear them but other than that performed well. No OT needs at this time.       Recommendations for follow up therapy are one component of a multi-disciplinary discharge planning process, led by the attending physician.  Recommendations may be updated based on patient status, additional functional criteria and insurance authorization.   Follow Up Recommendations  No OT follow up     Assistance Recommended at Discharge PRN  Patient can return home with the following Assistance with cooking/housework    Functional Status Assessment  Patient has not had a recent decline in their functional status  Equipment Recommendations  None recommended by OT    Recommendations for Other Services       Precautions / Restrictions Precautions Precautions: Fall Restrictions Weight Bearing Restrictions: No      Mobility Bed Mobility Overal bed mobility: Independent                  Transfers Overall transfer level: Needs assistance Equipment used: Rolling walker (2 wheels) Transfers: Sit to/from Stand Sit to Stand: Supervision            General transfer comment: cues for safety and proper UE placement for bed and commode transfers      Balance Overall balance assessment: Mild deficits observed, not formally tested                                         ADL either performed or assessed with clinical judgement   ADL Overall ADL's : At baseline                                       General ADL Comments: Difficulty with socks and doesn't wear them at home.     Vision Patient Visual Report: No change from baseline       Perception     Praxis      Pertinent Vitals/Pain Pain Assessment Pain Assessment: No/denies pain     Hand Dominance Right   Extremity/Trunk Assessment Upper Extremity Assessment Upper Extremity Assessment: Overall WFL for tasks assessed   Lower Extremity Assessment Lower Extremity Assessment: Defer to PT evaluation   Cervical / Trunk Assessment Cervical / Trunk Assessment: Normal   Communication Communication Communication: No difficulties   Cognition Arousal/Alertness: Awake/alert Behavior During Therapy: WFL for tasks assessed/performed Overall Cognitive Status: Within Functional Limits for tasks assessed  General Comments  pt reports he has been immobile over the past few months and needs something to get him going again    Exercises     Shoulder Instructions      Home Living Family/patient expects to be discharged to:: Private residence Living Arrangements: Alone Available Help at Discharge: Family (Nephew PRN) Type of Home: House Home Access: Stairs to enter [employer] of Steps: 3 Entrance Stairs-Rails: Right Home Layout: One level     Bathroom Shower/Tub: Teacher, early years/pre: [place]: Conservation officer, nature (2 wheels);Cane - single point;Rollator (4 wheels)   Additional Comments: RW one in house and one in car,      Prior  Functioning/Environment Prior Level of Function : Independent/Modified Independent             Mobility Comments: pt reports mod I with all ADLs, self care tasks wtih the exception of doning socks, IADLs and driving. pt reports recently using rollator in home ADLs Comments: patient was doing own grocery shopping.        OT Problem List: Pain      OT Treatment/Interventions:      OT Goals(Current goals can be found in the care plan section) Acute Rehab OT Goals OT Goal Formulation: All assessment and education complete, DC therapy  OT Frequency:      Co-evaluation              AM-PAC OT "6 Clicks" Daily Activity     Outcome Measure Help from another person eating meals?: None Help from another person taking care of personal grooming?: None Help from another person toileting, which includes using toliet, bedpan, or urinal?: None Help from another person bathing (including washing, rinsing, drying)?: None Help from another person to put on and taking off regular upper body clothing?: None Help from another person to put on and taking off regular lower body clothing?: None 6 Click Score: 24   End of Session Equipment Utilized During Treatment: Rolling walker (2 wheels)  Activity Tolerance: Patient tolerated treatment well Patient left:  (left with Jana Half, PT)  OT Visit Diagnosis: Pain                [id number] OT Time Calculation (min): 14 min Charges:  OT General Charges $OT Visit: 1 Visit OT Evaluation $OT Eval Low Complexity: 1 Low  Gustavo Lah, OTR/L [place]  Office [phone]   Lenward Chancellor 07/17/2022, 11:43 AM

## 2022-07-17 NOTE — TOC Initial Note (Signed)
Transition of Care [facility]) - Initial/Assessment Note   Patient Details  Name: Alexander Rich MRN: [id number] Date of Birth: 10-11-1925  [facility] CM/SW Contact:    Sherie Don, LCSW Phone Number: 07/17/2022, 1:59 PM  Clinical Narrative: PT evaluation recommended HHPT. Patient is agreeable to HHPT referral to [facility] as he has used the agency before. CSW made [facility] referral to [facility] with [place], which was accepted. HH orders have been placed.  Expected Discharge Plan: [place] Barriers to Discharge: Continued Medical Work up  Patient Goals and CMS Choice Patient states their goals for this hospitalization and ongoing recovery are:: Go home with [place] CMS Medicare.gov Compare Post Acute Care list provided to:: Patient Choice offered to / list presented to : Patient  Expected Discharge Plan and Services In-house Referral: Clinical Social Work Post Acute Care Choice: [place] arrangements for the past 2 months: Single Family Home             DME Arranged: N/A DME Agency: NA HH Arranged: PT HH Agency: [place] Date [place]: 07/17/22 Time [place]: 60 Representative spoke with at [place]: Claiborne Billings  Prior Living Arrangements/Services Living arrangements for the past 2 months: [place] with:: Self Patient language and need for interpreter reviewed:: Yes Do you feel safe going back to the place where you live?: Yes      Need for Family Participation in Patient Care: No (Comment) Care giver support system in place?: Yes (comment) Criminal Activity/Legal Involvement Pertinent to Current Situation/Hospitalization: No - Comment as needed  Activities of Daily Living Home Assistive Devices/Equipment: None ADL Screening (condition at time of admission) Patient's cognitive ability adequate to safely complete daily activities?: Yes Is the patient deaf or have difficulty hearing?: No Does  the patient have difficulty seeing, even when wearing glasses/contacts?: No Does the patient have difficulty concentrating, remembering, or making decisions?: No Patient able to express need for assistance with ADLs?: Yes Does the patient have difficulty dressing or bathing?: No Independently performs ADLs?: Yes (appropriate for developmental age) Does the patient have difficulty walking or climbing stairs?: Yes Weakness of Legs: Both Weakness of Arms/Hands: None  Permission Sought/Granted Permission sought to share information with : Other (comment) Permission granted to share information with : Yes, Verbal Permission Granted Permission granted to share info w AGENCY: [facility]  Emotional Assessment Appearance:: Appears stated age Attitude/Demeanor/Rapport: Engaged Affect (typically observed): Accepting Orientation: : Oriented to Self, Oriented to Place, Oriented to  Time, Oriented to Situation Alcohol / Substance Use: Not Applicable Psych Involvement: No (comment)  Admission diagnosis:  Lymphedema [I89.0] Bilateral lower extremity edema [R60.0] Pain of left hip [M25.552] Patient Active Problem List   Diagnosis Date Noted   Prostate cancer metastatic to bone ([place]) 07/17/2022   Prostate enlargement 07/16/2022   Bilateral lower extremity edema 07/14/2022   History of duodenal ulcer 03/25/2022   Primary localized osteoarthritis of left hip 03/24/2022   Benign prostatic hyperplasia without urinary obstruction 03/22/2022   Sinus bradycardia 03/22/2022   Acute upper gastrointestinal bleeding 03/21/2022   Leukocytosis 03/21/2022   Macrocytic anemia 03/21/2022   Essential hypertension 03/21/2022   Hyperpigmented skin lesions 03/21/2022   Thrombocytopenia ([place]) 03/21/2022   Pain of left hip joint 11/17/2021   Osteoarthritis of knee 02/17/2021   PCP:  Antony Contras, MD Pharmacy:   [facility] [phone] - [place], [address] AT [facility]  [address] [place] [address] Phone: [phone] Fax: [place] [address] Phone: [phone] Fax: [phone]  Social Determinants of Health (SDOH) Social History: SDOH Screenings   Food Insecurity: No Food Insecurity (03/21/2022)  Housing: Low Risk  (03/21/2022)  Transportation Needs: No Transportation Needs (03/21/2022)  Utilities: Not At Risk (03/21/2022)  Tobacco Use: Low Risk  (07/17/2022)   SDOH Interventions:    Readmission Risk Interventions     No data to display

## 2022-07-17 NOTE — Progress Notes (Signed)
Progress Note    Alexander Rich   [id number]  DOB: [birth year]  DOA: 07/14/2022     3 PCP: Antony Contras, MD  Initial CC: LE swelling, severe left hip pain  Hospital Course: Mr. Alexander Rich is a 89  yo male with PMH chronic left hip pain due to osteoarthritis, HTN, BPH, sinus brady, duodenal ulcer who presents with worsening left hip pain, increased swelling in bilateral legs, and difficulty ambulating due to this. He typically lives alone and is independent.  Due to worsening lower extremity edema and pain, he is now having much more difficulty completing typical ADLs.  He was dependent on his nephew to help him get to the hospital today for further evaluation.  He follows outpatient routinely with orthopedic surgery for routine left hip injections.  He also undergoes intermittent right shoulder injections as well. Last hospitalization in November 2023 when he presented for GI bleed, he also was able to undergo joint injection with Ortho prior to discharge to help with achieving adequate ambulation.  He was given a dose of IV Lasix which he responded well to with brisk diuresis.  Pain was treated with morphine and Robaxin.  His goal is to return to independent living status if able and is motivated for working with PT/OT also. Due to uncontrolled pain, he is admitted for further pain control and treatment of lower extremity edema.  Interval History:  No events overnight.  Seen this afternoon after returning from his bone scan and CT.  Some improvement in his lower extremity swelling since yesterday.  TED hose in place.  Overall feeling better with improved pain in left hip.  Ambulated fairly well with PT/OT with recommendations for home health PT at discharge.  Assessment and Plan: * Bilateral lower extremity edema - Patient endorses dependent edema at end of the day however typically is resolved when waking up in the mornings.  Now notes that edema is persistent in the mornings -BNP 84.   Denies any PND, orthopnea, dyspnea -Other considered etiology includes medication side effect of amlodipine which will be held.  Does respond to IV Lasix which has been continued - monitor strict I&O - place TED hose  Primary localized osteoarthritis of left hip - uncontrolled pain on admission and patient unable to ambulate due to this - Continue further pain control - PT/OT evaluation -Discussed with orthopedic surgery. MRI L Hip reviewed too. No need for inpatient injection, he can follow-up outpatient as needed  Prostate cancer metastatic to bone [facility]) - MRI was obtained to better evaluate left hip but showed prostatomegaly with concern for prostate cancer and also noted to have scattered bone lesions worrisome for metastatic disease.  There is a lesion involving left acetabulum but no pathologic fracture and a large lesion involving L3 vertebral body.  Pelvic lymphadenopathy also appreciated - PSA 587 - oncology following appreciate assistance; tentative plan is for antiandrogen therapy -Follow-up bone scan and CT chest/abdomen/pelvis -Tentative plan for discharge on Saturday if okay with oncology  Essential hypertension - Hold amlodipine in setting of lower extremity edema - Continue losartan  Macrocytic anemia - Hemoglobin has improved from prior hospitalization - prior B12 level low normal; start on supplementation    History of duodenal ulcer - Continue Protonix   Old records reviewed in assessment of this patient  Antimicrobials:   DVT prophylaxis:  enoxaparin (LOVENOX) injection 40 mg Start: 07/15/22 1000 Place TED hose Start: 07/14/22 2122   Code Status:   Code Status: Full Code  Mobility Assessment (last 72 hours)     Mobility Assessment     Row Name 07/17/22 1141 07/17/22 0958 07/16/22 0809 07/15/22 2131 07/15/22 1149   Does patient have an order for bedrest or is patient medically unstable -- -- No - Continue assessment No - Continue assessment No -  Continue assessment   What is the highest level of mobility based on the progressive mobility assessment? Level 5 (Walks with assist in room/hall) - Balance while stepping forward/back and can walk in room with assist - Complete Level 5 (Walks with assist in room/hall) - Balance while stepping forward/back and can walk in room with assist - Complete Level 5 (Walks with assist in room/hall) - Balance while stepping forward/back and can walk in room with assist - Complete Level 5 (Walks with assist in room/hall) - Balance while stepping forward/back and can walk in room with assist - Complete Level 5 (Walks with assist in room/hall) - Balance while stepping forward/back and can walk in room with assist - Complete    Row Name 07/15/22 02:59:30           Does patient have an order for bedrest or is patient medically unstable No - Continue assessment                Barriers to discharge:  Disposition Plan: Home Saturday Status is: Inpt  Objective: Blood pressure 123/76, pulse 80, temperature 97.6 F (36.4 C), temperature source Oral, resp. rate 16, weight 78 kg, SpO2 98 %.  Examination:  Physical Exam Constitutional:      General: He is not in acute distress.    Appearance: Normal appearance. He is not ill-appearing.  HENT:     Head: Normocephalic and atraumatic.     Mouth/Throat:     Mouth: Mucous membranes are moist.  Eyes:     Extraocular Movements: Extraocular movements intact.  Cardiovascular:     Rate and Rhythm: Normal rate and regular rhythm.  Pulmonary:     Effort: Pulmonary effort is normal. No respiratory distress.     Breath sounds: Normal breath sounds. No wheezing.  Abdominal:     General: Bowel sounds are normal. There is no distension.     Palpations: Abdomen is soft.     Tenderness: There is no abdominal tenderness.  Musculoskeletal:        General: Swelling (3+ B/L LE edema has been slowly improving daily) present.     Cervical back: Normal range of motion  and neck supple.     Comments: Decreased passive ROM in left hip due to pain  Skin:    General: Skin is warm and dry.  Neurological:     General: No focal deficit present.     Mental Status: He is alert.  Psychiatric:        Mood and Affect: Mood normal.      Consultants:    Procedures:    Data Reviewed: Results for orders placed or performed during the hospital encounter of 07/14/22 (from the past 24 hour(s))  PSA     Status: Abnormal   Collection Time: 07/16/22  3:08 PM  Result Value Ref Range   Prostatic Specific Antigen 587.81 (H) 0.00 - 4.00 ng/mL  Basic metabolic panel     Status: Abnormal   Collection Time: 07/17/22  5:15 AM  Result Value Ref Range   Sodium 136 135 - 145 mmol/L   Potassium 3.2 (L) 3.5 - 5.1 mmol/L   Chloride 99 98 - 111 mmol/L  CO2 27 22 - 32 mmol/L   Glucose, Bld 129 (H) 70 - 99 mg/dL   BUN 18 8 - 23 mg/dL   Creatinine, Ser 1.16 0.61 - 1.24 mg/dL   Calcium 8.4 (L) 8.9 - 10.3 mg/dL   GFR, Estimated 58 (L) >60 mL/min   Anion gap 10 5 - 15  CBC with Differential/Platelet     Status: Abnormal   Collection Time: 07/17/22  5:15 AM  Result Value Ref Range   WBC 6.4 4.0 - 10.5 K/uL   RBC 3.43 (L) 4.22 - 5.81 MIL/uL   Hemoglobin 10.9 (L) 13.0 - 17.0 g/dL   HCT 33.6 (L) 39.0 - 52.0 %   MCV 98.0 80.0 - 100.0 fL   MCH 31.8 26.0 - 34.0 pg   MCHC 32.4 30.0 - 36.0 g/dL   RDW 15.0 11.5 - 15.5 %   Platelets 146 (L) 150 - 400 K/uL   nRBC 0.0 0.0 - 0.2 %   Neutrophils Relative % 54 %   Neutro Abs 3.5 1.7 - 7.7 K/uL   Lymphocytes Relative 30 %   Lymphs Abs 1.9 0.7 - 4.0 K/uL   Monocytes Relative 15 %   Monocytes Absolute 0.9 0.1 - 1.0 K/uL   Eosinophils Relative 1 %   Eosinophils Absolute 0.0 0.0 - 0.5 K/uL   Basophils Relative 0 %   Basophils Absolute 0.0 0.0 - 0.1 K/uL   Immature Granulocytes 0 %   Abs Immature Granulocytes 0.02 0.00 - 0.07 K/uL  Magnesium     Status: None   Collection Time: 07/17/22  5:15 AM  Result Value Ref Range   Magnesium  2.2 1.7 - 2.4 mg/dL  Ferritin     Status: None   Collection Time: 07/17/22  5:15 AM  Result Value Ref Range   Ferritin 45 24 - 336 ng/mL  Iron and TIBC     Status: None   Collection Time: 07/17/22  5:15 AM  Result Value Ref Range   Iron 53 45 - 182 ug/dL   TIBC 290 250 - 450 ug/dL   Saturation Ratios 18 17.9 - 39.5 %   UIBC 237 ug/dL    I have reviewed pertinent nursing notes, vitals, labs, and images as necessary. I have ordered labwork to follow up on as indicated.  I have reviewed the last notes from staff over past 24 hours. I have discussed patient's care plan and test results with nursing staff, CM/SW, and other staff as appropriate.  Time spent: Greater than 50% of the 55 minute visit was spent in counseling/coordination of care for the patient as laid out in the A&P.   LOS: 3 days   Dwyane Dee, MD [facility] 07/17/2022, 1:21 PM

## 2022-07-17 NOTE — Progress Notes (Signed)
Mr. Alexander Rich does have prostate cancer.  His PSA is about 600.  I am not sure that prostate cancer is the cause of his hip pain.  He is to have a CT scan done today and a bone scan done.  I also do not think that the prostate cancer is the cause of his swelling in the lower legs and feet.  Again, I suppose if he does have prostate cancer involving lymph nodes that might be an issue with lower extremity edema.  Since he is 89 years old, he should respond incredibly well to antiandrogen therapy.  We will keep it simple.  We will put him on degarelix and Casodex.  I think this is all at he will need.  I really do not think that he is a candidate for aggressive antiadjuvant therapy along with chemotherapy.  He also will need to have a bisphosphonate.  We will have to see how his renal function is.  Whether or not he needs XRT will be dictated by the scans.  I talked him about this.  I told that he does not need surgery lets his to replace his hip.  We can get his PSA down quite nicely, in my opinion, with antiandrogen therapy.  He has had no nausea or vomiting.  He says that he really does not have much pain over the left hip right now.  Again we have to await the results of the CT scan and  the bone scan.  I do think that he will do very well with prostate cancer therapy.  I just do not think that the prostate cancer will be the cause of his passing in the future.  Again, the CT scan and bone scan will be critical.  I do know that he is getting wonderful care from all the staff up on 3 E.   Lattie Haw, MD  [facility] 4:32

## 2022-07-17 NOTE — Assessment & Plan Note (Signed)
-   MRI was obtained to better evaluate left hip but showed prostatomegaly with concern for prostate cancer and also noted to have scattered bone lesions worrisome for metastatic disease.  There is a lesion involving left acetabulum but no pathologic fracture and a large lesion involving L3 vertebral body.  Pelvic lymphadenopathy also appreciated - PSA 587 - oncology following appreciate assistance; tentative plan is for antiandrogen therapy -Follow-up bone scan and CT chest/abdomen/pelvis -Tentative plan for discharge on Saturday if okay with oncology

## 2022-07-17 NOTE — Evaluation (Signed)
Physical Therapy Evaluation Patient Details Name: Alexander Rich MRN: [id number] DOB: [birth year] Today's Date: 07/17/2022  History of Present Illness  89 yo male admitted to hospital on 3/12 due to B LE edema and L hip pain impacting pt functional mobility in home setting. L hip x-ray negative for acute findings, MRI revealed bony lesions and prostatemegaly and concern for prostate ca. Pt PMH includes but is not limited to OA, HTN, BPH, sinus bradycardia, duodenal ulcer, HTN, anemia. Pt started on IV lasix, will follow up OP Orthopedic for L hip pain and pending CT today.  Clinical Impression      Pt admitted secondary to problem above with deficits below. Patient was Mod I at rollator level home alone PLOF. Pt currently requires S for bed mobility and transfer tasks at RW level, gait 220 feet with RW and min guard with good reciprocal pattern and cadence. Pt denies, pain, dizziness, SOB and falls. Pt is motivated to maintain IND in home setting.  Anticipate patient will benefit from PT to address problems listed below.Will continue to follow acutely to maximize functional mobility independence and safety.   96% on RA with exertion and PR 88    Recommendations for follow up therapy are one component of a multi-disciplinary discharge planning process, led by the attending physician.  Recommendations may be updated based on patient status, additional functional criteria and insurance authorization.  Follow Up Recommendations Home health PT      Assistance Recommended at Discharge Intermittent Supervision/Assistance  Patient can return home with the following  A little help with bathing/dressing/bathroom;Assistance with cooking/housework;Assist for transportation;Help with stairs or ramp for entrance    Equipment Recommendations None recommended by PT (pt reports all DME in home setting)  Recommendations for Other Services       Functional Status Assessment Patient has had a recent decline  in their functional status and demonstrates the ability to make significant improvements in function in a reasonable and predictable amount of time.     Precautions / Restrictions Precautions Precautions: Fall Restrictions Weight Bearing Restrictions: No      Mobility  Bed Mobility Overal bed mobility: Needs Assistance Bed Mobility: Supine to Sit, Sit to Supine     Supine to sit: Supervision Sit to supine: Supervision   General bed mobility comments: increased time    Transfers Overall transfer level: Needs assistance Equipment used: Rolling walker (2 wheels) Transfers: Sit to/from Stand Sit to Stand: Supervision           General transfer comment: cues for safety and proper UE placement for bed and commode transfers    Ambulation/Gait Ambulation/Gait assistance: Min guard Gait Distance (Feet): 220 Feet Assistive device: Rolling walker (2 wheels) Gait Pattern/deviations: Step-through pattern       General Gait Details: good reciprocal pattern, B LE passing in stance phase and B foot clearance, good cadence, slight flexed posture  Stairs            Wheelchair Mobility    Modified Rankin (Stroke Patients Only)       Balance Overall balance assessment: Needs assistance Sitting-balance support: Feet supported Sitting balance-Leahy Scale: Fair     Standing balance support: During functional activity, Reliant on assistive device for balance Standing balance-Leahy Scale: Poor                               Pertinent Vitals/Pain      Home Living Family/patient expects to  be discharged to:: Private residence Living Arrangements: Alone Available Help at Discharge: Family (Nephew PRN) Type of Home: House Home Access: Stairs to enter Entrance Stairs-Rails: Right Entrance Stairs-Number of Steps: 3   Home Layout: One level [place]: Conservation officer, nature (2 wheels);Cane - single point;Rollator (4 wheels) Additional Comments: RW one in  house and one in car,    Prior Function Prior Level of Function : Independent/Modified Independent             Mobility Comments: pt reports mod I with all ADLs, self care tasks wtih the exception of doning socks, IADLs and driving. pt reports recently using rollator in home ADLs Comments: patient was doing own grocery shopping.     Hand Dominance   Dominant Hand: Right    Extremity/Trunk Assessment        Lower Extremity Assessment Lower Extremity Assessment: Overall WFL for tasks assessed       Communication   Communication: No difficulties  Cognition Arousal/Alertness: Awake/alert Behavior During Therapy: WFL for tasks assessed/performed Overall Cognitive Status: Within Functional Limits for tasks assessed                                          General Comments General comments (skin integrity, edema, etc.): pt reports he has been immobile over the past few months and needs something to get him going again    Exercises     Assessment/Plan    PT Assessment Patient needs continued PT services  PT Problem List Decreased strength;Decreased activity tolerance;Decreased balance;Decreased mobility       PT Treatment Interventions DME instruction;Gait training;Stair training;Functional mobility training;Therapeutic activities;Therapeutic exercise;Balance training;Neuromuscular re-education;Patient/family education    PT Goals (Current goals can be found in the Care Plan section)  Acute Rehab PT Goals Patient Stated Goal: to maintain IND for as long as possible PT Goal Formulation: With patient Time For Goal Achievement: 07/31/22 Potential to Achieve Goals: Good    Frequency Min 3X/week     Co-evaluation               AM-PAC PT "6 Clicks" Mobility  Outcome Measure Help needed turning from your back to your side while in a flat bed without using bedrails?: None Help needed moving from lying on your back to sitting on the side of a  flat bed without using bedrails?: None Help needed moving to and from a bed to a chair (including a wheelchair)?: None Help needed standing up from a chair using your arms (e.g., wheelchair or bedside chair)?: A Little Help needed to walk in hospital room?: A Little Help needed climbing 3-5 steps with a railing? : Total 6 Click Score: 19    End of Session Equipment Utilized During Treatment: Gait belt Activity Tolerance: Patient tolerated treatment well Patient left: in bed;with call bell/phone within reach Nurse Communication: Mobility status PT Visit Diagnosis: Unsteadiness on feet (R26.81);Muscle weakness (generalized) (M62.81)    Time: [id number] PT Time Calculation (min) (ACUTE ONLY): 18 min   Charges:   PT Evaluation $PT Eval Low Complexity: [place], PT   Adair Patter 07/17/2022, 10:09 AM

## 2022-07-18 DIAGNOSIS — C61 Malignant neoplasm of prostate: Secondary | ICD-10-CM | POA: Diagnosis not present

## 2022-07-18 DIAGNOSIS — C7951 Secondary malignant neoplasm of bone: Secondary | ICD-10-CM | POA: Diagnosis not present

## 2022-07-18 DIAGNOSIS — M1612 Unilateral primary osteoarthritis, left hip: Secondary | ICD-10-CM | POA: Diagnosis not present

## 2022-07-18 DIAGNOSIS — K639 Disease of intestine, unspecified: Secondary | ICD-10-CM

## 2022-07-18 DIAGNOSIS — R600 Localized edema: Secondary | ICD-10-CM | POA: Diagnosis not present

## 2022-07-18 LAB — TESTOSTERONE: Testosterone: 534 ng/dL (ref 264–916)

## 2022-07-18 MED ORDER — BICALUTAMIDE 50 MG PO TABS: 50.0000 mg | ORAL_TABLET | Freq: Every day | ORAL | 3 refills | Status: DC

## 2022-07-18 MED ORDER — CYANOCOBALAMIN 1000 MCG PO TABS: 1000.0000 ug | ORAL_TABLET | Freq: Every day | ORAL | Status: DC

## 2022-07-18 NOTE — Assessment & Plan Note (Signed)
-   per CT: Eccentric thickening of the sigmoid in the setting of diverticular changes is nonspecific. Correlation with Cologuard testing could be considered to determine whether further evaluation may be warranted. - can follow up outpt for this although given advanced age and clinical status, not sure if would be worth being aggressive about

## 2022-07-18 NOTE — TOC Progression Note (Addendum)
Transition of Care [facility]) - Progression Note    Patient Details  Name: Alexander Rich MRN: [id number] Date of Birth: 1925/08/12  [facility] CM/SW Contact  Alexander Dine, RN Phone Number: 07/18/2022, 10:20 AM  Clinical Narrative:    Order added for [place]; notified Katina at [facility]; she says service can be added; spoke w/ pt in room; discussed d/c home w HHPT/SW; pt says his nephew Rushie Nyhan had requested MD send him to facility for a little while b/c he is too weak to go home; called pt's nephew Rushie Nyhan [phone]); and he confirms the same; Mr Domingo Dimes says he is 89 y/o and his wife is totally dependent on him; he says there is no one else and he can not be at home w/ pt; Dr Sabino Gasser notified; awaiting final disposition.  -1215- see today's pt note; attempted to inform pt's nephew Rushie Nyhan [phone]) of PT evaluation; awaiting return call.  -1226- pt's nephew Rushie Nyhan called back; he was notified of PT results/recc; he verbalized understanding and agrees to d/c plan; Mr Domingo Dimes said he will be here mid-afternoon to pick up pt; no TOC needs.  Expected Discharge Plan: [place] Barriers to Discharge: Continued Medical Work up  Expected Discharge Plan and Services In-house Referral: Clinical Social Work   Post Acute Care Choice: [place] arrangements for the past 2 months: [place] Expected Discharge Date: 07/18/22               DME Arranged: N/A DME Agency: NA       HH Arranged: PT HH Agency: [place] Date [place]: 07/17/22 Time [place]: [id number] Representative spoke with at [place]: [place] Determinants of Health ([place]) Interventions SDOH Screenings   Food Insecurity: No Food Insecurity (07/17/2022)  Housing: Low Risk  (07/17/2022)  Transportation Needs: No Transportation Needs (07/17/2022)  Utilities: Not At Risk (07/17/2022)  Tobacco Use: Low  Risk  (07/17/2022)    Readmission Risk Interventions     No data to display

## 2022-07-18 NOTE — Progress Notes (Signed)
Physical Therapy Treatment Patient Details Name: Alexander Rich MRN: [id number] DOB: 1925/06/08 Today's Date: 07/18/2022   History of Present Illness 89 yo male admitted to hospital on 3/12 due to B LE edema and L hip pain impacting pt functional mobility in home setting. L hip x-ray negative for acute findings, MRI revealed bony lesions and prostatemegaly and concern for prostate ca. Pt PMH includes but is not limited to OA, HTN, BPH, sinus bradycardia, duodenal ulcer, HTN, anemia. Pt started on IV lasix, will follow up [facility] for L hip pain and pending CT today.    PT Comments    Pt progressing quite well, incr giat distance/tolerance. See below for mobility.  Recommend HHPT/OT. Spoke with nephew on phone at pt request, nephew verbalizes he  wants pt to go SNF, pt states he wants to go home.  Reviewed with nephew that it is unlikely that insurance would approve SNF given pt mobility status but of course TOC would have to determine this.  Pt endorses that he has been "lazy" for over a month now.    Recommendations for follow up therapy are one component of a multi-disciplinary discharge planning process, led by the attending physician.  Recommendations may be updated based on patient status, additional functional criteria and insurance authorization.  Follow Up Recommendations  Home health PT     Assistance Recommended at Discharge Intermittent Supervision/Assistance  Patient can return home with the following A little help with bathing/dressing/bathroom;Assistance with cooking/housework;Assist for transportation;Help with stairs or ramp for entrance   Equipment Recommendations  Other (comment) (has RWs)    Recommendations for Other Services       Precautions / Restrictions Precautions Precautions: Fall Restrictions Weight Bearing Restrictions: No     Mobility  Bed Mobility Overal bed mobility: Independent                  Transfers   Equipment used:  Rolling walker (2 wheels) Transfers: Sit to/from Stand Sit to Stand: Supervision           General transfer comment: cues for safety and proper UE placement varied surfaces- bed and commode and recliner; repeated STS x7 for strengthening    Ambulation/Gait Ambulation/Gait assistance: Supervision, Min guard Gait Distance (Feet): 280 Feet (x2) Assistive device: Rolling walker (2 wheels) Gait Pattern/deviations: Step-through pattern, Trunk flexed, Wide base of support       General Gait Details: reciprocal gait, min/guard to supervision for safety. occaisonal cues for RW safety with turns   Stairs Stairs: Yes Stairs assistance: Min guard Stair Management: Step to pattern, One rail Right Number of Stairs: 3 General stair comments: cues for step to pattern for safety; pt verbalizes he uses this technique routinely at home; min/guard for safety   Wheelchair Mobility    Modified Rankin (Stroke Patients Only)       Balance Overall balance assessment: Needs assistance Sitting-balance support: No upper extremity supported, Feet supported Sitting balance-Leahy Scale: Normal     Standing balance support: During functional activity, Reliant on assistive device for balance Standing balance-Leahy Scale: Fair Standing balance comment: wide BOS but able to stand static without UE support                            Cognition Arousal/Alertness: Awake/alert Behavior During Therapy: WFL for tasks assessed/performed Overall Cognitive Status: Within Functional Limits for tasks assessed  Exercises General Exercises - Lower Extremity Long Arc Quad: AROM, Both, 10 reps    General Comments        Pertinent Vitals/Pain Pain Assessment Pain Assessment: No/denies pain (sometimes bursitis pain L hip)    Home Living                          Prior Function            PT Goals (current goals can now  be found in the care plan section) Acute Rehab PT Goals PT Goal Formulation: With patient Time For Goal Achievement: 07/31/22 Potential to Achieve Goals: Good Progress towards PT goals: Progressing toward goals    Frequency    Min 3X/week      PT Plan Current plan remains appropriate    Co-evaluation              AM-PAC PT "6 Clicks" Mobility   Outcome Measure  Help needed turning from your back to your side while in a flat bed without using bedrails?: None Help needed moving from lying on your back to sitting on the side of a flat bed without using bedrails?: None Help needed moving to and from a bed to a chair (including a wheelchair)?: None Help needed standing up from a chair using your arms (e.g., wheelchair or bedside chair)?: None Help needed to walk in hospital room?: A Little Help needed climbing 3-5 steps with a railing? : A Little 6 Click Score: 22    End of Session Equipment Utilized During Treatment: Gait belt Activity Tolerance: Patient tolerated treatment well Patient left: with call bell/phone within reach;Other (comment) (bathroom, NT notified) Nurse Communication: Mobility status PT Visit Diagnosis: Unsteadiness on feet (R26.81);Muscle weakness (generalized) (M62.81)     Time: [id number] PT Time Calculation (min) (ACUTE ONLY): 20 min  Charges:  $Gait Training: 8-22 mins                     Baxter Flattery, PT  Acute Rehab Dept [facility]) [phone]  [facility] Weekend Pager [facility] only)  [phone]  07/18/2022    [facility] 07/18/2022, 11:41 AM

## 2022-07-18 NOTE — Plan of Care (Signed)

## 2022-07-18 NOTE — Discharge Summary (Signed)
Physician Discharge Summary   Alexander Rich [id number] DOB: 12/29/1925 DOA: 07/14/2022  PCP: Antony Contras, MD  Admit date: 07/14/2022 Discharge date: 07/18/2022  Admitted From: Home Disposition:  Home Discharging physician: Dwyane Dee, MD Barriers to discharge: none  Recommendations for Outpatient Follow-up:  Follow up with oncology May need LTC if unable to live at home any further, but evaluated by PT x 2 with same rec's for HHPT/OT See sigmoid thickening below and in CT report   Home Health: PT, OT, SW  Discharge Condition: stable CODE STATUS: Full Diet recommendation:  Diet Orders (From admission, onward)     Start     Ordered   07/18/22 0000  Diet general        07/18/22 0919   07/15/22 0956  Diet regular Room service appropriate? Yes; Fluid consistency: Thin  Diet effective now       Question Answer Comment  Room service appropriate? Yes   Fluid consistency: Thin      07/15/22 0955            Hospital Course: Alexander Rich is a 89  yo male with PMH chronic left hip pain due to osteoarthritis, HTN, BPH, sinus brady, duodenal ulcer who presents with worsening left hip pain, increased swelling in bilateral legs, and difficulty ambulating due to this. He typically lives alone and is independent.  Due to worsening lower extremity edema and pain, he is now having much more difficulty completing typical ADLs.  He was dependent on his nephew to help him get to the hospital today for further evaluation.  He follows outpatient routinely with orthopedic surgery for routine left hip injections.  He also undergoes intermittent right shoulder injections as well. Last hospitalization in November 2023 when he presented for GI bleed, he also was able to undergo joint injection with Ortho prior to discharge to help with achieving adequate ambulation.  He was given a dose of IV Lasix which he responded well to with brisk diuresis.  Pain was treated with morphine and Robaxin.  His  goal is to return to independent living status if able and is motivated for working with PT/OT also. Due to uncontrolled pain, he is admitted for further pain control and treatment of lower extremity edema. He underwent further workup including MRI left hip which was notable for bilateral hip degenerative changes but no fracture or AVN.  It did note degeneration of the labrum and being torn.  Findings were discussed with orthopedic surgery with no recommendations for inpatient steroid injection. From a pain standpoint, he had slow improvement of his pain during hospitalization and was able to ambulate more independently with PT by the end of hospitalization as well.  Recommendations remained patient discharging home with home health which was also arranged.  Other findings that were noted on MRI hip included scattered bone lesions and pelvic lymphadenopathy concerning for metastatic prostate cancer.  Prostatomegaly also noted and this was then followed by CT chest/abdomen/pelvis and a nuc med bone scan. This further revealed signs of prostate cancer with nodal and bone metastasis notably L3 metastatic lesion.  Patient had no neurologic deficits nor signs of cord compression. Bladder scans showed low PVR and patient was voiding well.  Images did show bladder wall thickening concerning for outlet obstruction although he had no clinical signs. He was evaluated by oncology as he wished for some treatment.  He was started on Firmagon and Casodex.  He will continue outpatient follow-up with oncology.  Assessment and Plan: *  Bilateral lower extremity edema - Patient endorses dependent edema at end of the day however typically is resolved when waking up in the mornings.  Now notes that edema is persistent in the mornings -BNP 84.  Denies any PND, orthopnea, dyspnea -Treated with some Lasix during hospitalization with good response - CT showing bulky nodal disease in the pelvis was considered to explain his  lower extremity edema.  He was recommended to continue supportive care with leg elevation and compression stockings as needed  Primary localized osteoarthritis of left hip - uncontrolled pain on admission and patient unable to ambulate due to this - Continue further pain control - PT/OT evaluation: Evaluated twice during hospitalization with no change in recommendations which included home health PT/OT -Discussed with orthopedic surgery. MRI L Hip reviewed too. No need for inpatient injection, he can follow-up outpatient as needed  Prostate cancer metastatic to bone [facility]) - MRI was obtained to better evaluate left hip but showed prostatomegaly with concern for prostate cancer and also noted to have scattered bone lesions worrisome for metastatic disease.  There is a lesion involving left acetabulum but no pathologic fracture and a large lesion involving L3 vertebral body.  Pelvic lymphadenopathy also appreciated - PSA 587 - oncology following appreciate assistance; tentative plan is for antiandrogen therapy (started on Firmagon and casodex) - outpatient follow up with oncology   Sigmoid thickening - per CT: Eccentric thickening of the sigmoid in the setting of diverticular changes is nonspecific. Correlation with Cologuard testing could be considered to determine whether further evaluation may be warranted. - can follow up outpt for this although given advanced age and clinical status, not sure if would be worth being aggressive about   Essential hypertension - continue home regimen  Macrocytic anemia - Hemoglobin has improved from prior hospitalization - prior B12 level low normal; start on supplementation    History of duodenal ulcer - Continue Protonix   The patient's chronic medical conditions were treated accordingly per the patient's home medication regimen except as noted.  On day of discharge, patient was felt deemed stable for discharge. Patient/family member advised to call  PCP or come back to ER if needed.   Principal Diagnosis: Bilateral lower extremity edema  Discharge Diagnoses: Active Hospital Problems   Diagnosis Date Noted   Bilateral lower extremity edema 07/14/2022    Priority: 1.   Primary localized osteoarthritis of left hip 03/24/2022    Priority: 2.   Prostate cancer metastatic to bone ([place]) 07/17/2022    Priority: 3.   Sigmoid thickening 07/18/2022    Priority: 4.   Essential hypertension 03/21/2022    Priority: 4.   Macrocytic anemia 03/21/2022    Priority: 5.   History of duodenal ulcer 03/25/2022    Resolved Hospital Problems  No resolved problems to display.     Discharge Instructions     Diet general   Complete by: As directed    Increase activity slowly   Complete by: As directed       Allergies as of 07/18/2022       Reactions   Ibuprofen Other (See Comments)   GI bleed   Nebivolol Hcl Other (See Comments)   feels "blah"; low energy   Pravastatin Other (See Comments)   Lethargy   Sertraline Hcl Other (See Comments)   Terrible feeling   Simvastatin Other (See Comments)   Lethargy   Trazodone Other (See Comments)   Foggy headed        Medication List  STOP taking these medications    amLODipine 2.5 MG tablet Commonly known as: NORVASC       TAKE these medications    acetaminophen 325 MG tablet Commonly known as: TYLENOL Take 1 tablet (325 mg total) by mouth every 6 (six) hours as needed for mild pain (or Fever >/= 101).   ASCORBIC ACID PO Take 1 tablet by mouth in the morning. Vitamin C, unknown strength   bicalutamide 50 MG tablet Commonly known as: CASODEX Take 1 tablet (50 mg total) by mouth daily.   cyanocobalamin 1000 MCG tablet Take 1 tablet (1,000 mcg total) by mouth daily.   furosemide 20 MG tablet Commonly known as: LASIX Take 1 tablet (20 mg total) by mouth daily as needed for fluid or edema. What changed: when to take this   Linzess 145 MCG Caps capsule Generic drug:  linaclotide Take 145 mcg by mouth daily as needed (constipation).   losartan 100 MG tablet Commonly known as: COZAAR Take 1 tablet (100 mg total) by mouth in the morning.   oxyCODONE-acetaminophen 5-325 MG tablet Commonly known as: PERCOCET/ROXICET Take 2 tablets by mouth every 6 (six) hours as needed for pain.   polyethylene glycol 17 g packet Commonly known as: MIRALAX / GLYCOLAX Take 17 g by mouth daily as needed. Hold for diarrhea. Take when taking percocet   SYSTANE OP Place 1 drop into both eyes 4 (four) times daily as needed (dry eyes).   tamsulosin 0.4 MG Caps capsule Commonly known as: FLOMAX Take 0.4 mg by mouth in the morning.   VITAMIN D PO Take 1 tablet by mouth daily.        Follow-up Information     [facility] [place] [facility].   Specialty: Home Health Services Why: [facility] will provide PT in the home after discharge. Contact information: [address] [place] [address] [phone]         Volanda Napoleon, MD. Schedule an appointment as soon as possible for a visit in 2 week(s).   Specialty: Oncology Contact information: [place] [address] [phone]         Antony Contras, MD. Schedule an appointment as soon as possible for a visit in 1 week(s).   Specialty: Family Medicine Contact information: [address] [place] [address] [phone]                Allergies  Allergen Reactions   Ibuprofen Other (See Comments)    GI bleed   Nebivolol Hcl Other (See Comments)    feels "blah"; low energy   Pravastatin Other (See Comments)    Lethargy   Sertraline Hcl Other (See Comments)    Terrible feeling   Simvastatin Other (See Comments)    Lethargy   Trazodone Other (See Comments)    Foggy headed    Consultations: Oncology  Procedures:   Discharge Exam: BP 108/63 (BP Location: Left Arm)   Pulse 87   Temp 98.3 F (36.8 C) (Oral)   Resp 16   Wt 78  kg   SpO2 100%   BMI 25.39 kg/m  Physical Exam Constitutional:      General: He is not in acute distress.    Appearance: Normal appearance. He is not ill-appearing.  HENT:     Head: Normocephalic and atraumatic.     Mouth/Throat:     Mouth: Mucous membranes are moist.  Eyes:     Extraocular Movements: Extraocular movements intact.  Cardiovascular:     Rate and Rhythm: Normal rate and regular rhythm.  Pulmonary:     Effort: Pulmonary effort is normal. No respiratory distress.     Breath sounds: Normal breath sounds. No wheezing.  Abdominal:     General: Bowel sounds are normal. There is no distension.     Palpations: Abdomen is soft.     Tenderness: There is no abdominal tenderness.  Musculoskeletal:        General: Swelling (3+ B/L LE edema has been slowly improving daily) present.     Cervical back: Normal range of motion and neck supple.     Comments: Decreased passive ROM in left hip due to pain  Skin:    General: Skin is warm and dry.  Neurological:     General: No focal deficit present.     Mental Status: He is alert.  Psychiatric:        Mood and Affect: Mood normal.      The results of significant diagnostics from this hospitalization (including imaging, microbiology, ancillary and laboratory) are listed below for reference.   Microbiology: No results found for this or any previous visit (from the past 240 hour(s)).   Labs: BNP (last 3 results) Recent Labs    07/14/22 1538  BNP [id number]   Basic Metabolic Panel: Recent Labs  Lab 07/14/22 1538 07/15/22 0424 07/16/22 0443 07/17/22 0515  NA 135 136 139 136  K 4.0 3.5 3.3* 3.2*  CL 102 101 101 99  CO2 25 24 26 27   GLUCOSE 109* 115* 116* 129*  BUN 17 16 16 18   CREATININE 1.08 1.07 1.16 1.16  CALCIUM 8.5* 8.8* 8.7* 8.4*  MG  --  2.3 2.2 2.2   Liver Function Tests: Recent Labs  Lab 07/14/22 1538  AST 25  ALT 17  ALKPHOS 47  BILITOT 0.9  PROT 6.4*  ALBUMIN 3.6   No results for input(s): "LIPASE",  "AMYLASE" in the last 168 hours. No results for input(s): "AMMONIA" in the last 168 hours. CBC: Recent Labs  Lab 07/14/22 1538 07/15/22 0424 07/16/22 0443 07/17/22 0515  WBC 7.1 8.6 6.2 6.4  NEUTROABS  --  5.6 3.2 3.5  HGB 12.3* 12.8* 11.3* 10.9*  HCT 38.6* 40.1 35.0* 33.6*  MCV 99.0 98.8 98.3 98.0  PLT 181 180 155 146*   Cardiac Enzymes: No results for input(s): "CKTOTAL", "CKMB", "CKMBINDEX", "TROPONINI" in the last 168 hours. BNP: Invalid input(s): "POCBNP" CBG: No results for input(s): "GLUCAP" in the last 168 hours. D-Dimer No results for input(s): "DDIMER" in the last 72 hours. Hgb A1c No results for input(s): "HGBA1C" in the last 72 hours. Lipid Profile No results for input(s): "CHOL", "HDL", "LDLCALC", "TRIG", "CHOLHDL", "LDLDIRECT" in the last 72 hours. Thyroid function studies No results for input(s): "TSH", "T4TOTAL", "T3FREE", "THYROIDAB" in the last 72 hours.  Invalid input(s): "FREET3" Anemia work up Recent Labs    07/17/22 0515  FERRITIN 45  TIBC 290  IRON 53   Urinalysis No results found for: "COLORURINE", "APPEARANCEUR", "LABSPEC", "PHURINE", "GLUCOSEU", "HGBUR", "BILIRUBINUR", "KETONESUR", "PROTEINUR", "UROBILINOGEN", "NITRITE", "LEUKOCYTESUR" Sepsis Labs Recent Labs  Lab 07/14/22 1538 07/15/22 0424 07/16/22 0443 07/17/22 0515  WBC 7.1 8.6 6.2 6.4   Microbiology No results found for this or any previous visit (from the past 240 hour(s)).  Procedures/Studies: CT CHEST ABDOMEN PELVIS W CONTRAST  Result Date: 07/17/2022 CLINICAL DATA:  89 year old male presents for evaluation of prostate cancer with bilateral lower extremity swelling and hip pain. * Tracking Code:  BO * EXAM: CT CHEST, ABDOMEN, AND PELVIS WITH CONTRAST TECHNIQUE: Multidetector CT imaging of the chest, abdomen and pelvis was performed following the standard protocol during bolus administration of intravenous contrast. RADIATION DOSE REDUCTION: This exam was performed according to  the departmental dose-optimization program which includes automated exposure control, adjustment of the mA and/or kV according to patient size and/or use of iterative reconstruction technique. CONTRAST:  158mL OMNIPAQUE IOHEXOL 300 MG/ML  SOLN COMPARISON:  Sep 26, 2019 FINDINGS: CT CHEST FINDINGS Cardiovascular: Calcified and noncalcified aortic atherosclerosis. No aneurysmal dilation. Signs coronary artery calcification. No pericardial effusion. Mediastinum/Nodes: No thoracic inlet, axillary, mediastinal or hilar adenopathy. Esophagus grossly normal. Lungs/Pleura: No consolidation. No pleural effusion. No pneumothorax. Mild bronchiectasis at the RIGHT lung base similar to prior imaging associated with chronic changes. No suspicious nodules. Musculoskeletal: See below for full musculoskeletal details. No chest wall masses. CT ABDOMEN PELVIS FINDINGS Hepatobiliary: Hepatic cysts which are unchanged. No signs of biliary duct dilation. Cholelithiasis. No pericholecystic stranding. Tiny hypodensity in the posterior RIGHT hepatic lobe (image 57/2) stable since Sep 15, 2019. Pancreas: Atrophy of the pancreas without signs of inflammation or mass. Spleen: Normal. Adrenals/Urinary Tract: Adrenal glands are normal. Symmetric renal enhancement without hydronephrosis. Diffuse thickening of the urinary bladder sparing somewhat the posterior urinary bladder mainly lateral and anterior urinary bladder. Calcification at the bladder base. This could even be within the prostate measuring 4 mm. No ureteral dilation. Stomach/Bowel: Small hiatal hernia similar to previous imaging. No perigastric stranding. Small bowel without dilation or signs of inflammation. Appendix is normal. No colonic inflammation. Sigmoid diverticular changes with thickening of the sigmoid slightly more focal in eccentric on image 74/5 along the superior aspect of the sigmoid colon. Vascular/Lymphatic: Aortic atherosclerosis. No aneurysmal dilation of the  abdominal aorta. Bulky pelvic adenopathy. (Image 90/2) RIGHT common iliac node 15 mm. (Image 99/2) 27 mm short axis and 43 mm long axis. RIGHT external iliac lymph node (image 106/2) 31 mm short axis. Image 103/2 with a 27 mm LEFT external iliac lymph node. Hypogastric lymph node with rounded appearance 9 mm. Soft tissue tracking along neurovascular structures in the LEFT sacro sciatic notch (image 109/2 Enlarged mesorectal lymph node 14 mm 114/2. Smaller RIGHT pelvic lymph nodes involving internal iliac chain as well. Reproductive: Markedly heterogeneous and enlarged prostate in this patient with known prostate cancer. Other: No ascites. Musculoskeletal: Destructive lesion in the LEFT L3 transverse process which expands the transverse process and extends into the central canal. This may measure as much as 4 x 2.3 cm and may show encroachment upon the thecal sac. Just below this is severe central canal narrowing due to a combination of facet hypertrophy and discogenic changes. There is a pathologic fracture also at the L3 level along the superior endplate. Sclerosis at T1 compatible with metastatic involvement at this level, mild loss of height may represent mild pathologic fracture as well. Visualized clavicles and scapulae are unremarkable. Sternum is intact. No sign of displaced rib fracture. Bony pelvis is intact in hips are located. Levoconvex curvature of the spine is noted. IMPRESSION: 1. Signs of prostate cancer with nodal and bone metastasis. 2. L3 metastatic lesion with extensive soft tissue extending into a narrowed central canal. Correlate with any acute symptoms of cord compression and consider MRI for further assessment as warranted. Superior endplate deformity at L3 likely a combination of Schmorl's node and pathologic fracture. Potential for early pathologic fracture at T1 as well. 3. Soft tissue along neurovascular structures in the LEFT pelvis  raising the question of sciatic nerve involvement from  tumor extension in this area this patient with LEFT-sided hip pain. 4. Bulky nodal disease in the pelvis likely explains lower extremity edema. 5. Bladder wall thickening which is largely circumferential likely related to bladder outlet obstruction with potential small bladder calculus. 6. Eccentric thickening of the sigmoid in the setting of diverticular changes is nonspecific. Correlation with Cologuard testing could be considered to determine whether further evaluation may be warranted. Aortic Atherosclerosis (ICD10-I70.0). These results will be called to the ordering clinician or representative by the Radiologist Assistant, and communication documented in the PACS or [employer]. Electronically Signed   By: Zetta Bills M.D.   On: 07/17/2022 13:23   NM Bone Scan Whole Body  Result Date: 07/17/2022 CLINICAL DATA:  High risk prostate carcinoma EXAM: NUCLEAR MEDICINE WHOLE BODY BONE SCAN TECHNIQUE: Whole body anterior and posterior images were obtained approximately 3 hours after intravenous injection of radiopharmaceutical. RADIOPHARMACEUTICALS:  21.8 mCi Technetium-25m MDP IV COMPARISON:  Chest CT 07/17/2022 FINDINGS: Intense radiotracer activity in the posterior RIGHT aspect of the L3 vertebral body corresponds to expansile lytic lesion on comparison CT. No additional evidence of skeletal metastasis identified. Degenerative changes of the shoulders and knees. IMPRESSION: Solitary metastatic lesion to the L 3 vertebral body. Electronically Signed   By: Suzy Bouchard M.D.   On: 07/17/2022 13:02   MR HIP LEFT WO CONTRAST  Result Date: 07/16/2022 CLINICAL DATA:  Left hip pain. EXAM: MR OF THE LEFT HIP WITHOUT CONTRAST TECHNIQUE: Multiplanar, multisequence MR imaging was performed. No intravenous contrast was administered. COMPARISON:  CT scan 09/26/2019 FINDINGS: Both hips are normally located. Moderate bilateral hip joint degenerative changes with joint space narrowing and spurring. No stress  fracture or AVN. No hip joint effusion. No periarticular fluid collection to suggest a paralabral cyst. The labrum is degenerated and torn, not unexpected for age. Small paralabral cyst. The pubic symphysis and SI joints are intact. No pelvic fractures are identified. There are small scattered bone lesions suspicious for metastatic disease. There is a lesion involving the left acetabulum but no pathologic fractures identified. There is a large lesion involving the L3 vertebral body. There is also pelvic lymphadenopathy. The largest right external iliac node measures 5.6 x 2.8 cm. Left-sided node measures 3.7 x 2.4 cm. 11 mm node is noted in the mesorectum with smaller bilateral pelvic sidewall lymph nodes. Findings are highly suspicious for prostate cancer. The prostate gland is markedly enlarged. Mild inflammation/edema involving the iliacus muscles and also to a lesser extent the adductor muscles which could reflect muscle strains or partial tears. IMPRESSION: 1. Moderate bilateral hip joint degenerative changes but no stress fracture or AVN. 2. The labrum is degenerated and torn, not unexpected for age. Associated small paralabral cyst. 3. Numerous scattered bone lesions and pelvic lymphadenopathy highly suspicious for metastatic prostate cancer. Recommend correlation with PSA level. 4. Prostatomegaly. 5. Mild inflammation/edema involving the iliacus muscles and also to a lesser extent the adductor muscles which could reflect muscle strains or partial tears. Electronically Signed   By: Marijo Sanes M.D.   On: 07/16/2022 07:06   DG Hip Unilat W or Wo Pelvis 2-3 Views Left  Result Date: 07/14/2022 CLINICAL DATA:  Left hip pain EXAM: DG HIP (WITH OR WITHOUT PELVIS) 2-3V LEFT COMPARISON:  None Available. FINDINGS: Bones are osteopenic. There is no evidence of hip fracture or dislocation. Hip joint spaces are maintained. There are degenerative changes of the lower lumbar spine. IMPRESSION: 1. No  acute fracture or  dislocation. 2. Osteopenia. Electronically Signed   By: Ronney Asters M.D.   On: 07/14/2022 19:19   DG Chest 2 View  Result Date: 07/14/2022 CLINICAL DATA:  Fluid retention EXAM: CHEST - 2 VIEW COMPARISON:  CXR 05/24/08 FINDINGS: Bibasilar airspace opacities, which could represent atelectasis or infection. Normal cardiac and mediastinal contours. No radiographically apparent displaced rib fractures. Visualized upper abdomen is unremarkable. Vertebral body heights are maintained. IMPRESSION: No significant pulmonary edema or pleural effusions. Electronically Signed   By: Marin Roberts M.D.   On: 07/14/2022 15:58     Time coordinating discharge: Over 30 minutes    Dwyane Dee, MD  [facility] 07/18/2022, 2:44 PM

## 2022-07-18 NOTE — Progress Notes (Signed)
Reviewed written d/c instructions w pt and his nephew and all questions answered. They both verbalized understanding. D/C via w/c w all belongings in stable condition

## 2022-07-22 DIAGNOSIS — C7951 Secondary malignant neoplasm of bone: Secondary | ICD-10-CM | POA: Diagnosis not present

## 2022-07-22 DIAGNOSIS — C61 Malignant neoplasm of prostate: Secondary | ICD-10-CM | POA: Diagnosis not present

## 2022-07-22 DIAGNOSIS — I959 Hypotension, unspecified: Secondary | ICD-10-CM | POA: Diagnosis not present

## 2022-07-22 DIAGNOSIS — K639 Disease of intestine, unspecified: Secondary | ICD-10-CM | POA: Diagnosis not present

## 2022-08-10 DIAGNOSIS — M6281 Muscle weakness (generalized): Secondary | ICD-10-CM | POA: Diagnosis not present

## 2022-08-10 DIAGNOSIS — R269 Unspecified abnormalities of gait and mobility: Secondary | ICD-10-CM | POA: Diagnosis not present

## 2022-08-21 DIAGNOSIS — F330 Major depressive disorder, recurrent, mild: Secondary | ICD-10-CM | POA: Diagnosis not present

## 2022-08-21 DIAGNOSIS — C7951 Secondary malignant neoplasm of bone: Secondary | ICD-10-CM | POA: Diagnosis not present

## 2022-08-21 DIAGNOSIS — D692 Other nonthrombocytopenic purpura: Secondary | ICD-10-CM | POA: Diagnosis not present

## 2022-08-21 DIAGNOSIS — N400 Enlarged prostate without lower urinary tract symptoms: Secondary | ICD-10-CM | POA: Diagnosis not present

## 2022-08-21 DIAGNOSIS — C61 Malignant neoplasm of prostate: Secondary | ICD-10-CM | POA: Diagnosis not present

## 2022-08-21 DIAGNOSIS — I700 Atherosclerosis of aorta: Secondary | ICD-10-CM | POA: Diagnosis not present

## 2022-08-28 ENCOUNTER — Telehealth: Payer: Self-pay | Admitting: Hematology

## 2022-08-28 NOTE — Telephone Encounter (Signed)
Patient left message for someone to reach out to him regarding direction. Reached out to patient and gave him directions.

## 2022-09-02 ENCOUNTER — Other Ambulatory Visit: Payer: Self-pay

## 2022-09-02 ENCOUNTER — Inpatient Hospital Stay: Payer: Medicare Other

## 2022-09-02 ENCOUNTER — Other Ambulatory Visit (HOSPITAL_COMMUNITY): Payer: Self-pay

## 2022-09-02 ENCOUNTER — Encounter: Payer: Self-pay | Admitting: Hematology

## 2022-09-02 ENCOUNTER — Inpatient Hospital Stay: Payer: Medicare Other | Attending: Hematology | Admitting: Hematology

## 2022-09-02 VITALS — BP 168/77 | HR 80 | Temp 97.9°F | Resp 18 | Ht 67.0 in | Wt 166.9 lb

## 2022-09-02 DIAGNOSIS — Z5111 Encounter for antineoplastic chemotherapy: Secondary | ICD-10-CM | POA: Diagnosis not present

## 2022-09-02 DIAGNOSIS — E538 Deficiency of other specified B group vitamins: Secondary | ICD-10-CM | POA: Diagnosis not present

## 2022-09-02 DIAGNOSIS — C61 Malignant neoplasm of prostate: Secondary | ICD-10-CM | POA: Insufficient documentation

## 2022-09-02 DIAGNOSIS — C775 Secondary and unspecified malignant neoplasm of intrapelvic lymph nodes: Secondary | ICD-10-CM | POA: Insufficient documentation

## 2022-09-02 DIAGNOSIS — C7951 Secondary malignant neoplasm of bone: Secondary | ICD-10-CM | POA: Diagnosis not present

## 2022-09-02 MED ORDER — PREDNISONE 5 MG PO TABS
5.0000 mg | ORAL_TABLET | Freq: Every day | ORAL | 3 refills | Status: DC
  Filled 2022-09-02 – 2022-09-07 (×2): qty 30, 30d supply, fill #0
  Filled 2022-09-30: qty 30, 30d supply, fill #1

## 2022-09-02 MED ORDER — OXYCODONE HCL 5 MG PO TABS: 5.0000 mg | ORAL_TABLET | Freq: Three times a day (TID) | ORAL | 0 refills | Status: DC | PRN

## 2022-09-02 MED ORDER — ABIRATERONE ACETATE 250 MG PO TABS
1000.0000 mg | ORAL_TABLET | Freq: Every day | ORAL | 0 refills | Status: DC
  Filled 2022-09-02 – 2022-09-07 (×2): qty 120, 30d supply, fill #0

## 2022-09-02 NOTE — Progress Notes (Signed)
[facility]   Telephone:(336) [phone] Fax:(336) [phone]   Clinic New Consult Note   Patient Care Team: Tally Joe, MD as PCP - General (Family Medicine) Othella Boyer, MD as Consulting Physician (Cardiology) Tally Joe, MD as Attending Physician (Family Medicine)  Date of Service:  09/02/2022   CHIEF COMPLAINTS/PURPOSE OF CONSULTATION:  Prostate Cancer metastatic to bone  REFERRING PHYSICIAN:  Swayne,David,MD  ASSESSMENT & PLAN:  Alexander Rich is a 89 y.o.  male with a history of   1.  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  Workup showed enlarged prostate, but a pelvic adenopathy, and multiple bone lesions, especially L3 bone metastasis with tumor extending into central canal.  I personally reviewed the scan images and discussed the findings with patient. -His baseline PSA was significantly elevated at 587.  This is diagnostic for prostate cancer.  Given his advanced age, I do not think he needs any biopsy to confirm. -I discussed the natural course of metastatic prostate cancer and treatment options, I recommend androgen deprivation therapy, he started Degarelix on 07/17/2022 in the hospital, I will switch to Eligard 30 mg every 4 weeks for convenience.  Will get it approved this week, start next week. -Due to the bulky disease in his L3 and pelvic lymph nodes, with significant pain and leg edema, I recommend him to consider palliative radiation therapy.  I explained the logistics to him in detail, however patient is strongly against radiation therapy, he does not want to prolong his life, his goal of therapy is to improve his quality of life.  Also I think radiation will improve his pain control, he still declined RT -His leg edema has improved since he started ADT a month ago, however his pain is still significant, I recommend switch Casodex to  Zytiga and prednisone, for better disease control and symptom relief.  Potential benefit and side effect discussed with him, he agrees to proceed.  I called into [facility] pharmacy today. -Due to his advanced age, limited social support, if he progressed on ADT, I would recommend hospice care.  He is not a candidate for chemotherapy  2.  Cancer related pain -He has been having low back and left hip pain for the past 2 years, he receives oxycodone 30 tablets from his primary care physician every months, his pain is not adequately controlled.  I refilled oxycodone today, he can use every 8 hours as needed. -We discussed the potential side effect from narcotics, he has MiraLAX for constipation as needed  3.  Bone metastasis -we discussed the risk of fracture and cord compression from his bulky L3 bone mets -he reclined palliative RT -will start him on Xgeva injection next week   4.  Social support and deconditioning -He has no children, lives alone at home.  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 --Recommend Calcium and Vitamin D OTC -Phone visit in 1 months -lab and f/u in 2 months    Oncology  History Overview Note   Cancer Staging  Prostate cancer metastatic to bone [facility]) Staging form: Prostate, AJCC 8th Edition - Clinical: Stage IVB (cT3, cN1, pM1c, PSA: 587) - Signed by Josph Macho, MD on 07/17/2022 Prostate specific antigen (PSA) range: 20 or greater     Prostate cancer metastatic to bone (HCC)  07/17/2022 Initial Diagnosis   Prostate cancer metastatic to bone (HCC)    07/17/2022 Cancer Staging   Staging form: Prostate, AJCC 8th Edition - Clinical: Stage IVB (cT3, cN1, pM1c, PSA: 587) - Signed by Josph Macho, MD on 07/17/2022 Prostate specific antigen (PSA) range: 20 or greater   07/17/2022 Imaging    IMPRESSION: 1. Signs of prostate cancer with nodal and bone metastasis. 2. L3 metastatic lesion with extensive soft tissue extending into a narrowed central canal. Correlate with any acute symptoms of cord compression and consider MRI for further assessment as warranted. Superior endplate deformity at L3 likely a combination of Schmorl's node and pathologic fracture. Potential for early pathologic fracture at T1 as well. 3. Soft tissue along neurovascular structures in the LEFT pelvis raising the question of sciatic nerve involvement from tumor extension in this area this patient with LEFT-sided hip pain. 4. Bulky nodal disease in the pelvis likely explains lower extremity edema. 5. Bladder wall thickening which is largely circumferential likely related to bladder outlet obstruction with potential small bladder calculus. 6. Eccentric thickening of the sigmoid in the setting of diverticular changes is nonspecific. Correlation with Cologuard testing could be considered to determine whether further evaluation may be warranted.      HISTORY OF PRESENTING ILLNESS:  Alexander Rich 89 y.o. male is a here because of Prostate Cancer metastatic to bone. The patient was referred by Swayne,David,MD. The patient presents to the clinic today alone.  Patient has been having lower back and left hip pain for the past 2 years, this has been evaluated by his primary care physician, and felt to be arthritis related.  He is on oxycodone once a day which is prescribed by his primary care physician, but his pain is not controlled.  He also developed bilateral lower extremity edema a few months ago, he presented to hospital on July 14, 2022 for worsening pain and leg  edema.  Workup unfortunately reviewed metastatic prostate cancer with bone and nodal metastasis.  He received first dose Degarelix on July 17, 2022 in hospital.  He was discharged home on August 02, 2022.  He has been participating outpatient physical therapy, he has been using a walker in the past few months.  He still drives and came into the clinic by himself today.  Patient is widowed, has no children.  He has a niece and nephew who help him at home.  Pt state that his appetite isn't so  good. Pt has notice some weight loss since he has pain . Pt state that he fell two weeks ago.Pt has a lower back pain. Pt state that he uses a walker to help with mobility.   He has a PMHx of.... Bone Cancer Prostate Cancer Hypertension Back Surgery   Socially... Widower No children   REVIEW OF SYSTEMS:    Constitutional:(-)  Denies fevers, chills or abnormal night sweats Eyes: (-) Denies blurriness of vision, double vision or watery eyes Ears, nose, mouth, throat, and face: Denies mucositis or sore throat Respiratory:(-) Denies cough, dyspnea or wheezes Cardiovascular: Denies palpitation, chest discomfort or(+)  lower extremity swelling Gastrointestinal:  Denies nausea, heartburn or change in bowel  habits Skin:(-)  Denies abnormal skin rashes, Some visible bruises on the arm. Lymphatics: (-) Denies new lymphadenopathy or easy bruising Neurological:(-) Denies numbness, tingling or new weaknesses Behavioral/Psych: (-) Mood is stable, no new changes  All other systems were reviewed with the patient and are negative.   MEDICAL HISTORY:  Past Medical History:  Diagnosis Date   Constipation    Hip pain    Hyperpigmented skin lesions 03/21/2022   Hypertension    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    SURGICAL HISTORY: Past Surgical History:  Procedure Laterality Date   BACK SURGERY     ESOPHAGOGASTRODUODENOSCOPY N/A 03/24/2022   Procedure: ESOPHAGOGASTRODUODENOSCOPY (EGD);   Surgeon: Willis Modena, MD;  Location: [facility];  Service: Gastroenterology;  Laterality: N/A;    SOCIAL HISTORY: Social History   Socioeconomic History   Marital status: Widowed    Spouse name: Not on file   Number of children: 0   Years of education: Not on file   Highest education level: Not on file  Occupational History   Not on file  Tobacco Use   Smoking status: Never   Smokeless tobacco: Never  Vaping Use   Vaping Use: Never used  Substance and Sexual Activity   Alcohol use: Never   Drug use: Never   Sexual activity: Not on file  Other Topics Concern   Not on file  Social History Narrative   Not on file   Social Determinants of Health   Financial Resource Strain: Not on file  Food Insecurity: No Food Insecurity (07/17/2022)   Hunger Vital Sign    Worried About Running Out of Food in the Last Year: Never true    Ran Out of Food in the Last Year: Never true  Transportation Needs: No Transportation Needs (07/17/2022)   PRAPARE - Administrator, Civil Service (Medical): No    Lack of Transportation (Non-Medical): No  Physical Activity: Not on file  Stress: Not on file  Social Connections: Not on file  Intimate Partner Violence: Not At Risk (07/17/2022)   Humiliation, Afraid, Rape, and Kick questionnaire    Fear of Current or Ex-Partner: No    Emotionally Abused: No    Physically Abused: No    Sexually Abused: No    FAMILY HISTORY: Family History  Problem Relation Age of Onset   Kidney disease Mother    CVA Father     ALLERGIES:  is allergic to ibuprofen, nebivolol hcl, pravastatin, sertraline hcl, simvastatin, and trazodone.  MEDICATIONS:  Current Outpatient Medications  Medication Sig Dispense Refill   abiraterone acetate (ZYTIGA) 250 MG tablet Take 4 tablets (1,000 mg total) by mouth daily on an empty stomach 1 hour before meals OR 2 hours after a meal 120 tablet 0   oxyCODONE (OXY IR/ROXICODONE) 5 MG immediate release tablet Take 1-2  tablets (5-10 mg total) by mouth every 8 (eight) hours as needed for severe pain. 60 tablet 0   predniSONE (DELTASONE) 5 MG tablet Take 1 tablet (5 mg total) by mouth daily with breakfast. 30 tablet 3   acetaminophen (TYLENOL) 325 MG tablet Take 1 tablet (325 mg total) by mouth every 6 (six) hours as needed for mild pain (or Fever >/= 101).     ASCORBIC ACID PO Take 1 tablet by mouth in the morning. Vitamin C, unknown strength     bicalutamide (CASODEX) 50 MG tablet Take 1 tablet (50 mg total) by mouth daily. 30 tablet 3   cyanocobalamin 1000 MCG  tablet Take 1 tablet (1,000 mcg total) by mouth daily.     furosemide (LASIX) 20 MG tablet Take 1 tablet (20 mg total) by mouth daily as needed for fluid or edema. (Patient taking differently: Take 20 mg by mouth daily.)     linaclotide (LINZESS) 145 MCG CAPS capsule Take 145 mcg by mouth daily as needed (constipation).     losartan (COZAAR) 100 MG tablet Take 1 tablet (100 mg total) by mouth in the morning.     oxyCODONE-acetaminophen (PERCOCET/ROXICET) 5-325 MG tablet Take 2 tablets by mouth every 6 (six) hours as needed for pain.     Polyethyl Glycol-Propyl Glycol (SYSTANE OP) Place 1 drop into both eyes 4 (four) times daily as needed (dry eyes).     polyethylene glycol (MIRALAX / GLYCOLAX) 17 g packet Take 17 g by mouth daily as needed. Hold for diarrhea. Take when taking percocet 30 each 0   tamsulosin (FLOMAX) 0.4 MG CAPS capsule Take 0.4 mg by mouth in the morning.     VITAMIN D PO Take 1 tablet by mouth daily.     No current facility-administered medications for this visit.    PHYSICAL EXAMINATION: ECOG PERFORMANCE STATUS: 2 - Symptomatic, <50% confined to bed  Vitals:   09/02/22 1504  BP: (!) 168/77  Pulse: 80  Resp: 18  Temp: 97.9 F (36.6 C)  SpO2: 100%   Filed Weights   09/02/22 1504  Weight: 166 lb 14.4 oz (75.7 kg)     GENERAL:alert, no distress and comfortable SKIN: skin color normal, no rashes or significant  lesions EYES: normal, Conjunctiva are pink and non-injected, sclera clear  NEURO: alert & oriented x 3 with fluent speech LUNGS:(+)  clear to auscultation and percussion with normal breathing effort HEART: (-) regular rate & rhythm and no murmurs and(+)lower extremity edema LABORATORY DATA:  I have reviewed the data as listed    Latest Ref Rng & Units 07/17/2022    5:15 AM 07/16/2022    4:43 AM 07/15/2022    4:24 AM  CBC  WBC 4.0 - 10.5 K/uL 6.4  6.2  8.6   Hemoglobin 13.0 - 17.0 g/dL 16.1  09.6  04.5   Hematocrit 39.0 - 52.0 % 33.6  35.0  40.1   Platelets 150 - 400 K/uL 146  155  180        Latest Ref Rng & Units 07/17/2022    5:15 AM 07/16/2022    4:43 AM 07/15/2022    4:24 AM  CMP  Glucose 70 - 99 mg/dL 409  811  914   BUN 8 - 23 mg/dL 18  16  16    Creatinine 0.61 - 1.24 mg/dL 7.82  9.56  2.13   Sodium 135 - 145 mmol/L 136  139  136   Potassium 3.5 - 5.1 mmol/L 3.2  3.3  3.5   Chloride 98 - 111 mmol/L 99  101  101   CO2 22 - 32 mmol/L 27  26  24    Calcium 8.9 - 10.3 mg/dL 8.4  8.7  8.8      RADIOGRAPHIC STUDIES: I have personally reviewed the radiological images as listed and agreed with the findings in the report. No results found.   No orders of the defined types were placed in this encounter.   All questions were answered. The patient knows to call the clinic with any problems, questions or concerns. The total time spent in the appointment was 60 minutes.     Terrace Arabia  Mosetta Putt, MD 09/02/2022 4:14 PM  I, Monica Martinez am acting as scribe for Malachy Mood, MD.   I have reviewed the above documentation for accuracy and completeness, and I agree with the above.

## 2022-09-03 ENCOUNTER — Encounter: Payer: Self-pay | Admitting: Hematology

## 2022-09-03 ENCOUNTER — Other Ambulatory Visit (HOSPITAL_COMMUNITY): Payer: Self-pay

## 2022-09-03 ENCOUNTER — Telehealth: Payer: Self-pay | Admitting: Pharmacy Technician

## 2022-09-03 ENCOUNTER — Telehealth: Payer: Self-pay | Admitting: Pharmacist

## 2022-09-03 NOTE — Telephone Encounter (Signed)
Oral Oncology Patient Advocate Encounter  Prior Authorization for Abiraterone has been approved.    PA# [id number] Effective dates: 09/03/22 through 09/03/23  Patients co-pay is $266.67.    Jinger Neighbors, CPhT-Adv Oncology Pharmacy Patient Advocate [facility] Direct Number: [phone]  Fax: [phone]

## 2022-09-03 NOTE — Telephone Encounter (Signed)
Oral Oncology Pharmacist Encounter  Received new prescription for Zytiga (abiraterone) for the treatment of metastatic castrate sensitive prostate cancer in conjunction with prednisone and ADT, planned duration until disease progression or unacceptable drug toxicity.  CBC w/ Diff and BMP from 07/17/22 assessed, no baseline dose adjustments required. Prescription dose and frequency assessed for appropriateness.  Current medication list in Epic reviewed, DDIs with Zytiga identified: Category C drug-drug interaction between Zytiga and Tamsulosin - Zytiga, a CYP2D6 inhibitor may increase serum concentrations of tamsulosin, thus increase risk for side effects. Monitor for increased side effects from tamsulosin. No changes in therapy warranted at this time.   Evaluated chart and no patient barriers to medication adherence noted.   Patient agreement for treatment documented in MD note on 09/02/22.  Prescription has been e-scribed to the [facility] for benefits analysis and approval.  Oral Oncology Clinic will continue to follow for insurance authorization, copayment issues, initial counseling and start date.  [name], PharmD, BCPS, [facility] Hematology/Oncology Clinical Pharmacist [facility] Oral Chemotherapy Navigation Clinics [phone] 09/03/2022 8:32 AM

## 2022-09-03 NOTE — Telephone Encounter (Addendum)
Oral Oncology Patient Advocate Encounter   Received notification that prior authorization for Abiraterone is required.   PA submitted on 09/03/22 Key [id number] Status is pending     Jinger Neighbors, CPhT-Adv Oncology Pharmacy Patient Advocate [facility] Direct Number: [phone]  Fax: [phone]

## 2022-09-03 NOTE — Progress Notes (Signed)
Reached out to patient referred by Alan Ripper in pharmacy regarding financial concerns for medication. Introduced myself as Dance movement psychotherapist and to offer available resources. Discussed one-time $1000 Marketing executive to assist with personal expenses while going through treatment. Advised what is needed to apply. He will provide on 09/11/22 at next appointment to registrar and complete grant paperwork to be scanned and emailed to me. He will be given copies of documents as well as expense sheet. He will be approved for the $1000 grant as well as additional $200 Prostate grant for medication. My card will be given on that day as well.  Communicated approval to pharmacy and clinical staff via message.

## 2022-09-07 ENCOUNTER — Other Ambulatory Visit (HOSPITAL_COMMUNITY): Payer: Self-pay

## 2022-09-07 ENCOUNTER — Other Ambulatory Visit: Payer: Self-pay

## 2022-09-07 ENCOUNTER — Encounter: Payer: Self-pay | Admitting: Hematology

## 2022-09-07 NOTE — Progress Notes (Signed)
Spoke with patient via telephone to introduce myself as the prostate nurse navigator and discussed my role.  Pt has Med Onc Consult with Dr. Mosetta Putt on 5/1.  RN provided education on questions about Zytiga.  Patient is aware of upcoming injection appointment.  No barriers identified at this time.  RN provided patient with my direct contact number for future questions or concerns that may arise.

## 2022-09-07 NOTE — Telephone Encounter (Signed)
Oral Chemotherapy Pharmacist Encounter  I spoke with patient for overview of: Zytiga for the treatment of metastatic, castration-sensitive prostate cancer in conjunction with prednisone and ADT, planned duration until disease progression or unacceptable toxicity.   Counseled patient on administration, dosing, side effects, monitoring, drug-food interactions, safe handling, storage, and disposal.  Patient will take Zytiga 250mg  tablets, 4 tablets (1000mg ) by mouth once daily on an empty stomach, 1 hour before or 2 hours after a meal.  Patient states he will take his Zytiga in the morning and will wait at least 1 hour before eating.  Patient knows to discontinue his bicalutamide before he starts his Zytiga. He is aware to take his last dose of the bicalutamide on 09/10/22. I have called patient's [facility] pharmacy and cancelled the bicalutamide Rx as well.   Patient will take prednisone 5mg  tablet, 1 tablet by mouth one daily with breakfast.  Zytiga start date: 09/11/22  Adverse effects include but are not limited to: peripheral edema, GI upset, hypertension, hot flashes, fatigue, and arthralgias.    Reviewed with patient importance of keeping a medication schedule and plan for any missed doses. No barriers to medication adherence identified.  Medication reconciliation performed and medication/allergy list updated.  Insurance authorization for Alexander Rich has been obtained. This will ship from the [facility] outpatient pharmacy on 09/08/22 to deliver to patient's home on 09/09/22.  Patient informed the pharmacy will reach out 5-7 days prior to needing next fill of Zytiga to coordinate continued medication acquisition to prevent break in therapy.  All questions answered.  Alexander Rich voiced understanding and appreciation.   Medication education handout placed in mail for patient. Patient knows to call the office with questions or concerns. Oral Chemotherapy Clinic phone number provided to patient.    [name], PharmD, BCPS, [facility] Hematology/Oncology Clinical Pharmacist [facility] Oral Chemotherapy Navigation Clinics [phone] 09/07/2022 10:36 AM

## 2022-09-11 ENCOUNTER — Encounter: Payer: Self-pay | Admitting: Hematology

## 2022-09-11 ENCOUNTER — Inpatient Hospital Stay: Payer: Medicare Other

## 2022-09-11 ENCOUNTER — Other Ambulatory Visit: Payer: Self-pay

## 2022-09-11 VITALS — BP 146/62 | HR 88 | Temp 98.4°F | Resp 18

## 2022-09-11 DIAGNOSIS — C61 Malignant neoplasm of prostate: Secondary | ICD-10-CM

## 2022-09-11 DIAGNOSIS — Z5111 Encounter for antineoplastic chemotherapy: Secondary | ICD-10-CM | POA: Diagnosis not present

## 2022-09-11 DIAGNOSIS — C7951 Secondary malignant neoplasm of bone: Secondary | ICD-10-CM | POA: Diagnosis not present

## 2022-09-11 DIAGNOSIS — C775 Secondary and unspecified malignant neoplasm of intrapelvic lymph nodes: Secondary | ICD-10-CM | POA: Diagnosis not present

## 2022-09-11 DIAGNOSIS — E538 Deficiency of other specified B group vitamins: Secondary | ICD-10-CM | POA: Diagnosis not present

## 2022-09-11 LAB — CMP (CANCER CENTER ONLY)
ALT: 7 U/L (ref 0–44)
AST: 22 U/L (ref 15–41)
Albumin: 4 g/dL (ref 3.5–5.0)
Alkaline Phosphatase: 104 U/L (ref 38–126)
Anion gap: 6 (ref 5–15)
BUN: 19 mg/dL (ref 8–23)
CO2: 27 mmol/L (ref 22–32)
Calcium: 9.8 mg/dL (ref 8.9–10.3)
Chloride: 107 mmol/L (ref 98–111)
Creatinine: 1 mg/dL (ref 0.61–1.24)
GFR, Estimated: >60 mL/min (ref >60)
Glucose, Bld: 128 mg/dL — ABNORMAL HIGH (ref 70–99)
Potassium: 4.6 mmol/L (ref 3.5–5.1)
Sodium: 140 mmol/L (ref 135–145)
Total Bilirubin: 0.8 mg/dL (ref 0.3–1.2)
Total Protein: 7.2 g/dL (ref 6.5–8.1)

## 2022-09-11 LAB — CBC WITH DIFFERENTIAL (CANCER CENTER ONLY)
Abs Immature Granulocytes: 0.03 10*3/uL (ref 0.00–0.07)
Basophils Absolute: 0 10*3/uL (ref 0.0–0.1)
Basophils Relative: 0 %
Eosinophils Absolute: 0 10*3/uL (ref 0.0–0.5)
Eosinophils Relative: 0 %
HCT: 39.3 % (ref 39.0–52.0)
Hemoglobin: 12.6 g/dL — ABNORMAL LOW (ref 13.0–17.0)
Immature Granulocytes: 0 %
Lymphocytes Relative: 20 %
Lymphs Abs: 1.6 10*3/uL (ref 0.7–4.0)
MCH: 32.6 pg (ref 26.0–34.0)
MCHC: 32.1 g/dL (ref 30.0–36.0)
MCV: 101.6 fL — ABNORMAL HIGH (ref 80.0–100.0)
Monocytes Absolute: 0.4 10*3/uL (ref 0.1–1.0)
Monocytes Relative: 5 %
Neutro Abs: 6.1 10*3/uL (ref 1.7–7.7)
Neutrophils Relative %: 75 %
Platelet Count: 192 10*3/uL (ref 150–400)
RBC: 3.87 MIL/uL — ABNORMAL LOW (ref 4.22–5.81)
RDW: 13 % (ref 11.5–15.5)
WBC Count: 8.2 10*3/uL (ref 4.0–10.5)
nRBC: 0 % (ref 0.0–0.2)

## 2022-09-11 MED ORDER — LEUPROLIDE ACETATE (4 MONTH) 30 MG ~~LOC~~ KIT
30.0000 mg | PACK | Freq: Once | SUBCUTANEOUS | Status: AC
  Administered 2022-09-11: 30 mg via SUBCUTANEOUS
  Filled 2022-09-11: qty 30

## 2022-09-11 MED ORDER — DENOSUMAB 120 MG/1.7ML ~~LOC~~ SOLN
120.0000 mg | Freq: Once | SUBCUTANEOUS | Status: AC
  Administered 2022-09-11: 120 mg via SUBCUTANEOUS
  Filled 2022-09-11: qty 1.7

## 2022-09-11 NOTE — Progress Notes (Signed)
Patient provided documentation for [employer].  Patient approved for one-time $1000 [employer] grant and to assist with personal expenses while going through treatment and $200 Prostate grant for gas and medications. He received a gift card today from Prostate grant. He has a copy of the approval letter and expense sheet and advised to contact me on Monday to discuss other expenses in detail.  He has my card to do so and for any additional financial questions or concerns.

## 2022-09-11 NOTE — Patient Instructions (Signed)
Leuprolide Suspension for Injection (Prostate Cancer) What is this medication? LEUPROLIDE (loo PROE lide) reduces the symptoms of prostate cancer. It works by decreasing levels of the hormone testosterone in the body. This prevents prostate cancer cells from spreading or growing. This medicine may be used for other purposes; ask your health care provider or pharmacist if you have questions. COMMON BRAND NAME(S): Eligard, Lupron Depot, Lupron Depot-Ped, Lutrate Depot, Viadur What should I tell my care team before I take this medication? They need to know if you have any of these conditions: Diabetes Heart disease Heart failure High or low levels of electrolytes, such as magnesium, potassium, or sodium in your blood Irregular heartbeat or rhythm Seizures An unusual or allergic reaction to leuprolide, other medications, foods, dyes, or preservatives Pregnant or trying to get pregnant Breast-feeding How should I use this medication? This medication is injected under the skin or into a muscle. It is given by your care team in a hospital or clinic setting. Talk to your care team about the use of this medication in children. Special care may be needed. Overdosage: If you think you have taken too much of this medicine contact a poison control center or emergency room at once. NOTE: This medicine is only for you. Do not share this medicine with others. What if I miss a dose? Keep appointments for follow-up doses. It is important not to miss your dose. Call your care team if you are unable to keep an appointment. What may interact with this medication? Do not take this medication with any of the following: Cisapride Dronedarone Ketoconazole Levoketoconazole Pimozide Thioridazine This medication may also interact with the following: Other medications that cause heart rhythm changes This list may not describe all possible interactions. Give your health care provider a list of all the medicines,  herbs, non-prescription drugs, or dietary supplements you use. Also tell them if you smoke, drink alcohol, or use illegal drugs. Some items may interact with your medicine. What should I watch for while using this medication? Visit your care team for regular checks on your progress. Tell your care team if your symptoms do not start to get better or if they get worse. This medication may increase blood sugar. The risk may be higher in patients who already have diabetes. Ask your care team what you can do to lower the risk of diabetes while taking this medication. This medication may cause infertility. Talk to your care team if you are concerned about your fertility. Heart attacks and strokes have been reported with the use of this medication. Get emergency help if you develop signs or symptoms of a heart attack or stroke. Talk to your care team about the risks and benefits of this medication. What side effects may I notice from receiving this medication? Side effects that you should report to your care team as soon as possible: Allergic reactions--skin rash, itching, hives, swelling of the face, lips, tongue, or throat Heart attack--pain or tightness in the chest, shoulders, arms, or jaw, nausea, shortness of breath, cold or clammy skin, feeling faint or lightheaded Heart rhythm changes--fast or irregular heartbeat, dizziness, feeling faint or lightheaded, chest pain, trouble breathing High blood sugar (hyperglycemia)--increased thirst or amount of urine, unusual weakness or fatigue, blurry vision Mood swings, irritability, hostility Seizures Stroke--sudden numbness or weakness of the face, arm, or leg, trouble speaking, confusion, trouble walking, loss of balance or coordination, dizziness, severe headache, change in vision Thoughts of suicide or self-harm, worsening mood, feelings of depression Side   effects that usually do not require medical attention (report to your care team if they continue or  are bothersome): Bone pain Change in sex drive or performance General discomfort and fatigue Hot flashes Muscle pain Pain, redness, or irritation at injection site Swelling of the ankles, hands, or feet This list may not describe all possible side effects. Call your doctor for medical advice about side effects. You may report side effects to FDA at 1-800-FDA-1088. Where should I keep my medication? This medication is given in a hospital or clinic. It will not be stored at home. NOTE: This sheet is a summary. It may not cover all possible information. If you have questions about this medicine, talk to your doctor, pharmacist, or health care provider.  2023 Elsevier/Gold Standard (2021-06-30 00:00:00)  

## 2022-09-15 ENCOUNTER — Encounter: Payer: Self-pay | Admitting: Hematology

## 2022-09-15 NOTE — Progress Notes (Signed)
RN meet with patient in person prior to his injection appointment.  Patient denies any barriers. RN encouraged him to reach out to myself if anything should arise.  Verbalized understanding.    Patient received Eligard and Xgeva on 5/10, and has been contacted/educated from our pharmacist regarding his oral hormonal therapy.  Plan of care in progress.

## 2022-09-22 DIAGNOSIS — R2681 Unsteadiness on feet: Secondary | ICD-10-CM | POA: Diagnosis not present

## 2022-09-22 DIAGNOSIS — M5416 Radiculopathy, lumbar region: Secondary | ICD-10-CM | POA: Diagnosis not present

## 2022-09-22 DIAGNOSIS — K639 Disease of intestine, unspecified: Secondary | ICD-10-CM | POA: Diagnosis not present

## 2022-09-22 DIAGNOSIS — C7951 Secondary malignant neoplasm of bone: Secondary | ICD-10-CM | POA: Diagnosis not present

## 2022-09-22 DIAGNOSIS — C61 Malignant neoplasm of prostate: Secondary | ICD-10-CM | POA: Diagnosis not present

## 2022-09-22 DIAGNOSIS — F330 Major depressive disorder, recurrent, mild: Secondary | ICD-10-CM | POA: Diagnosis not present

## 2022-09-22 DIAGNOSIS — I119 Hypertensive heart disease without heart failure: Secondary | ICD-10-CM | POA: Diagnosis not present

## 2022-09-22 DIAGNOSIS — I872 Venous insufficiency (chronic) (peripheral): Secondary | ICD-10-CM | POA: Diagnosis not present

## 2022-09-25 ENCOUNTER — Other Ambulatory Visit: Payer: Self-pay

## 2022-09-29 ENCOUNTER — Other Ambulatory Visit: Payer: Self-pay

## 2022-09-30 ENCOUNTER — Other Ambulatory Visit: Payer: Self-pay | Admitting: Hematology

## 2022-09-30 ENCOUNTER — Other Ambulatory Visit: Payer: Self-pay

## 2022-09-30 MED ORDER — ABIRATERONE ACETATE 250 MG PO TABS
1000.0000 mg | ORAL_TABLET | Freq: Every day | ORAL | 0 refills | Status: DC
  Filled 2022-09-30: qty 120, 30d supply, fill #0

## 2022-10-02 ENCOUNTER — Other Ambulatory Visit (HOSPITAL_COMMUNITY): Payer: Self-pay

## 2022-10-06 ENCOUNTER — Other Ambulatory Visit: Payer: Self-pay

## 2022-10-06 ENCOUNTER — Telehealth: Payer: Self-pay

## 2022-10-06 ENCOUNTER — Other Ambulatory Visit (HOSPITAL_COMMUNITY): Payer: Self-pay

## 2022-10-06 ENCOUNTER — Encounter: Payer: Self-pay | Admitting: Hematology

## 2022-10-06 ENCOUNTER — Other Ambulatory Visit: Payer: Self-pay | Admitting: Nurse Practitioner

## 2022-10-06 MED ORDER — PREDNISONE 5 MG PO TABS: 5.0000 mg | ORAL_TABLET | Freq: Every day | ORAL | 3 refills | Status: DC

## 2022-10-06 MED ORDER — OXYCODONE HCL 5 MG PO TABS: 5.0000 mg | ORAL_TABLET | Freq: Three times a day (TID) | ORAL | 0 refills | Status: DC | PRN

## 2022-10-06 NOTE — Telephone Encounter (Signed)
Patient called in needing refills on 3 medications. He needs Abiraterone Acetate 250mg , Prednisone 5mg , and his Oxycodone-acetaminophen 5-325mg .

## 2022-10-07 ENCOUNTER — Other Ambulatory Visit (HOSPITAL_COMMUNITY): Payer: Self-pay

## 2022-10-09 ENCOUNTER — Other Ambulatory Visit: Payer: Self-pay

## 2022-10-09 ENCOUNTER — Inpatient Hospital Stay: Payer: Medicare Other | Attending: Hematology | Admitting: Hematology

## 2022-10-09 ENCOUNTER — Encounter: Payer: Self-pay | Admitting: Hematology

## 2022-10-09 ENCOUNTER — Other Ambulatory Visit (HOSPITAL_COMMUNITY): Payer: Self-pay

## 2022-10-09 DIAGNOSIS — C61 Malignant neoplasm of prostate: Secondary | ICD-10-CM

## 2022-10-09 DIAGNOSIS — C7951 Secondary malignant neoplasm of bone: Secondary | ICD-10-CM | POA: Diagnosis not present

## 2022-10-09 MED ORDER — ABIRATERONE ACETATE 250 MG PO TABS
1000.0000 mg | ORAL_TABLET | Freq: Every day | ORAL | 2 refills | Status: DC
  Filled 2022-10-09 – 2022-10-29 (×2): qty 120, 30d supply, fill #0
  Filled 2022-11-19: qty 120, 30d supply, fill #1
  Filled 2022-12-23: qty 120, 30d supply, fill #2

## 2022-10-09 NOTE — Progress Notes (Signed)
[facility]   Telephone:(336) [phone] Fax:(336) [phone]   Clinic Follow up Note   Patient Care Team: Tally Joe, MD as PCP - General (Family Medicine) Othella Boyer, MD as Consulting Physician (Cardiology) Tally Joe, MD as Attending Physician (Family Medicine) Cherlyn Cushing, RN as Oncology Nurse Navigator  Date of Service:  10/09/2022  I connected with Alexander Rich on 10/09/2022 at  4:00 PM EDT by telephone visit and verified that I am speaking with the correct person using two identifiers.  I discussed the limitations, risks, security and privacy concerns of performing an evaluation and management service by telephone and the availability of in person appointments. I also discussed with the patient that there may be a patient responsible charge related to this service. The patient expressed understanding and agreed to proceed.   Other persons participating in the visit and their role in the encounter:  No  Patient's location:  Home Provider's location:  [facility] Office  CHIEF COMPLAINT: f/u of Prostate Cancer metastatic to bone   CURRENT THERAPY:  Eigard Injection  q32months Zytiga 1000 mg daily Prednisone 5 mg daily  ASSESSMENT & PLAN:  Alexander Rich is a 89 y.o. male with    Prostate cancer metastatic to bone [facility]) Patient presented with worsening low back and left hip pain, and bilateral leg edema.  Workup showed enlarged prostate, but a pelvic adenopathy, and multiple bone lesions, especially L3 bone metastasis with tumor extending into central canal.  I personally reviewed the scan images and discussed the findings with patient. -His baseline PSA was significantly elevated at 587.  This is diagnostic for prostate cancer.  Given his advanced age, I do not think he needs any biopsy to confirm. -I discussed the natural course of metastatic prostate cancer and treatment options, I recommend androgen deprivation therapy, he started Degarelix on 07/17/2022 in  the hospital, I will switch to Eligard 30 mg every 4 weeks for convenience.  Will get it approved this week, start next week. -Due to the bulky disease in his L3 and pelvic lymph nodes, with significant pain and leg edema, I recommend him to consider palliative radiation therapy.  I explained the logistics to him in detail, however patient is strongly against radiation therapy, he does not want to prolong his life, his goal of therapy is to improve his quality of life.  Also I think radiation will improve his pain control, he still declined RT -His leg edema has improved since he started ADT a month ago, however his pain is still significant, I recommend switch Casodex to Zytiga and prednisone, for better disease control and symptom relief.  Potential benefit and side effect discussed with him, he agrees to proceed.  I called into [facility] pharmacy today. -Due to his advanced age, limited social support, if he progressed on ADT, I would recommend hospice care.  He is not a candidate for chemotherapy -He started treatment a month ago, his pain has much improved, he is back to oxycodone once a day now.  He is very happy and appreciative for the treatment he received.  PLAN: -Patient is clinically doing better, pain much improved,  -I refill Zytiga -lab and f/u and injection  11/2022 as scheduled    SUMMARY OF ONCOLOGIC HISTORY: Oncology History Overview Note   Cancer Staging  Prostate cancer metastatic to bone [facility]) Staging form: Prostate, AJCC 8th Edition - Clinical: Stage IVB (cT3, cN1, pM1c, PSA: 587) - Signed by Josph Macho, MD on 07/17/2022  Prostate specific antigen (PSA) range: 20 or greater     Prostate cancer metastatic to bone (HCC)  07/17/2022 Initial Diagnosis   Prostate cancer metastatic to bone (HCC)   07/17/2022 Cancer Staging   Staging form: Prostate, AJCC 8th Edition - Clinical: Stage IVB (cT3, cN1, pM1c, PSA: 587) - Signed by Josph Macho, MD on 07/17/2022 Prostate  specific antigen (PSA) range: 20 or greater   07/17/2022 Imaging    IMPRESSION: 1. Signs of prostate cancer with nodal and bone metastasis. 2. L3 metastatic lesion with extensive soft tissue extending into a narrowed central canal. Correlate with any acute symptoms of cord compression and consider MRI for further assessment as warranted. Superior endplate deformity at L3 likely a combination of Schmorl's node and pathologic fracture. Potential for early pathologic fracture at T1 as well. 3. Soft tissue along neurovascular structures in the LEFT pelvis raising the question of sciatic nerve involvement from tumor extension in this area this patient with LEFT-sided hip pain. 4. Bulky nodal disease in the pelvis likely explains lower extremity edema. 5. Bladder wall thickening which is largely circumferential likely related to bladder outlet obstruction with potential small bladder calculus. 6. Eccentric thickening of the sigmoid in the setting of diverticular changes is nonspecific. Correlation with Cologuard testing could be considered to determine whether further evaluation may be warranted.      INTERVAL HISTORY:  Alexander Rich was contacted for a follow up of Prostate Cancer metastatic to bone . He was last seen by me on 09/02/2022. Pt state that the swelling in his leg has subsided tremendously. Pt is still taking his Zytiga and prednisone.   All other systems were reviewed with the patient and are negative.  MEDICAL HISTORY:  Past Medical History:  Diagnosis Date   Constipation    Hip pain    Hyperpigmented skin lesions 03/21/2022   Hypertension    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    SURGICAL HISTORY: Past Surgical History:  Procedure Laterality Date   BACK SURGERY     ESOPHAGOGASTRODUODENOSCOPY N/A 03/24/2022   Procedure: ESOPHAGOGASTRODUODENOSCOPY (EGD);  Surgeon: Willis Modena, MD;  Location: [facility];  Service: Gastroenterology;   Laterality: N/A;    I have reviewed the social history and family history with the patient and they are unchanged from previous note.  ALLERGIES:  is allergic to ibuprofen, nebivolol hcl, pravastatin, sertraline hcl, simvastatin, and trazodone.  MEDICATIONS:  Current Outpatient Medications  Medication Sig Dispense Refill   abiraterone acetate (ZYTIGA) 250 MG tablet Take 4 tablets (1,000 mg total) by mouth daily on an empty stomach 1 hour before meals OR 2 hours after a meal 120 tablet 2   acetaminophen (TYLENOL) 325 MG tablet Take 1 tablet (325 mg total) by mouth every 6 (six) hours as needed for mild pain (or Fever >/= 101).     ASCORBIC ACID PO Take 1 tablet by mouth in the morning. Vitamin C, unknown strength     cyanocobalamin 1000 MCG tablet Take 1 tablet (1,000 mcg total) by mouth daily.     furosemide (LASIX) 20 MG tablet Take 1 tablet (20 mg total) by mouth daily as needed for fluid or edema. (Patient taking differently: Take 20 mg by mouth daily.)     linaclotide (LINZESS) 145 MCG CAPS capsule Take 145 mcg by mouth daily as needed (constipation).     losartan (COZAAR) 100 MG tablet Take 1 tablet (100 mg total) by mouth in the morning.     oxyCODONE (OXY  IR/ROXICODONE) 5 MG immediate release tablet Take 1-2 tablets (5-10 mg total) by mouth every 8 (eight) hours as needed for severe pain. 60 tablet 0   oxyCODONE-acetaminophen (PERCOCET/ROXICET) 5-325 MG tablet Take 2 tablets by mouth every 6 (six) hours as needed for pain.     Polyethyl Glycol-Propyl Glycol (SYSTANE OP) Place 1 drop into both eyes 4 (four) times daily as needed (dry eyes).     polyethylene glycol (MIRALAX / GLYCOLAX) 17 g packet Take 17 g by mouth daily as needed. Hold for diarrhea. Take when taking percocet 30 each 0   predniSONE (DELTASONE) 5 MG tablet Take 1 tablet (5 mg total) by mouth daily with breakfast. 30 tablet 3   tamsulosin (FLOMAX) 0.4 MG CAPS capsule Take 0.4 mg by mouth in the morning.     VITAMIN D PO  Take 1 tablet by mouth daily.     No current facility-administered medications for this visit.    PHYSICAL EXAMINATION: ECOG PERFORMANCE STATUS: 0 - Asymptomatic  There were no vitals filed for this visit. Wt Readings from Last 3 Encounters:  09/02/22 166 lb 14.4 oz (75.7 kg)  07/14/22 171 lb 15.3 oz (78 kg)  03/21/22 173 lb 4.5 oz (78.6 kg)     No vitals taken today, Exam not performed today  LABORATORY DATA:  I have reviewed the data as listed    Latest Ref Rng & Units 09/11/2022    2:42 PM 07/17/2022    5:15 AM 07/16/2022    4:43 AM  CBC  WBC 4.0 - 10.5 K/uL 8.2  6.4  6.2   Hemoglobin 13.0 - 17.0 g/dL 91.4  78.2  95.6   Hematocrit 39.0 - 52.0 % 39.3  33.6  35.0   Platelets 150 - 400 K/uL 192  146  155         Latest Ref Rng & Units 09/11/2022    2:42 PM 07/17/2022    5:15 AM 07/16/2022    4:43 AM  CMP  Glucose 70 - 99 mg/dL 213  086  578   BUN 8 - 23 mg/dL 19  18  16    Creatinine 0.61 - 1.24 mg/dL 4.69  6.29  5.28   Sodium 135 - 145 mmol/L 140  136  139   Potassium 3.5 - 5.1 mmol/L 4.6  3.2  3.3   Chloride 98 - 111 mmol/L 107  99  101   CO2 22 - 32 mmol/L 27  27  26    Calcium 8.9 - 10.3 mg/dL 9.8  8.4  8.7   Total Protein 6.5 - 8.1 g/dL 7.2     Total Bilirubin 0.3 - 1.2 mg/dL 0.8     Alkaline Phos 38 - 126 U/L 104     AST 15 - 41 U/L 22     ALT 0 - 44 U/L 7         RADIOGRAPHIC STUDIES: I have personally reviewed the radiological images as listed and agreed with the findings in the report. No results found.    No orders of the defined types were placed in this encounter.  All questions were answered. The patient knows to call the clinic with any problems, questions or concerns. No barriers to learning was detected. The total time spent in the appointment was 15 minutes.     Malachy Mood, MD 10/09/2022   Carolin Coy am acting as scribe for Malachy Mood, MD.   I have reviewed the above documentation for accuracy and completeness, and  I agree with the  above.

## 2022-10-09 NOTE — Assessment & Plan Note (Signed)
Patient presented with worsening low back and left hip pain, and bilateral leg edema.  Workup showed enlarged prostate, but a pelvic adenopathy, and multiple bone lesions, especially L3 bone metastasis with tumor extending into central canal.  I personally reviewed the scan images and discussed the findings with patient. -His baseline PSA was significantly elevated at 587.  This is diagnostic for prostate cancer.  Given his advanced age, I do not think he needs any biopsy to confirm. -I discussed the natural course of metastatic prostate cancer and treatment options, I recommend androgen deprivation therapy, he started Degarelix on 07/17/2022 in the hospital, I will switch to Eligard 30 mg every 4 weeks for convenience.  Will get it approved this week, start next week. -Due to the bulky disease in his L3 and pelvic lymph nodes, with significant pain and leg edema, I recommend him to consider palliative radiation therapy.  I explained the logistics to him in detail, however patient is strongly against radiation therapy, he does not want to prolong his life, his goal of therapy is to improve his quality of life.  Also I think radiation will improve his pain control, he still declined RT -His leg edema has improved since he started ADT a month ago, however his pain is still significant, I recommend switch Casodex to Zytiga and prednisone, for better disease control and symptom relief.  Potential benefit and side effect discussed with him, he agrees to proceed.  I called into [facility] pharmacy today. -Due to his advanced age, limited social support, if he progressed on ADT, I would recommend hospice care.  He is not a candidate for chemotherapy

## 2022-10-29 ENCOUNTER — Other Ambulatory Visit (HOSPITAL_COMMUNITY): Payer: Self-pay

## 2022-10-29 ENCOUNTER — Encounter: Payer: Self-pay | Admitting: Hematology

## 2022-10-29 ENCOUNTER — Other Ambulatory Visit: Payer: Self-pay

## 2022-10-30 ENCOUNTER — Other Ambulatory Visit (HOSPITAL_COMMUNITY): Payer: Self-pay

## 2022-11-02 ENCOUNTER — Inpatient Hospital Stay (HOSPITAL_COMMUNITY)
Admission: EM | Admit: 2022-11-02 | Discharge: 2022-11-04 | DRG: 178 | Disposition: A | Discharge status: Home Health | Payer: Medicare Other | Attending: Internal Medicine | Admitting: Internal Medicine

## 2022-11-02 ENCOUNTER — Emergency Department (HOSPITAL_COMMUNITY): Payer: Medicare Other

## 2022-11-02 ENCOUNTER — Other Ambulatory Visit: Payer: Self-pay

## 2022-11-02 DIAGNOSIS — C7951 Secondary malignant neoplasm of bone: Secondary | ICD-10-CM | POA: Diagnosis not present

## 2022-11-02 DIAGNOSIS — R55 Syncope and collapse: Secondary | ICD-10-CM

## 2022-11-02 DIAGNOSIS — Y92009 Unspecified place in unspecified non-institutional (private) residence as the place of occurrence of the external cause: Secondary | ICD-10-CM

## 2022-11-02 DIAGNOSIS — Z79899 Other long term (current) drug therapy: Secondary | ICD-10-CM | POA: Diagnosis not present

## 2022-11-02 DIAGNOSIS — U071 COVID-19: Secondary | ICD-10-CM | POA: Diagnosis not present

## 2022-11-02 DIAGNOSIS — Z7952 Long term (current) use of systemic steroids: Secondary | ICD-10-CM | POA: Diagnosis not present

## 2022-11-02 DIAGNOSIS — Z823 Family history of stroke: Secondary | ICD-10-CM

## 2022-11-02 DIAGNOSIS — W1811XA Fall from or off toilet without subsequent striking against object, initial encounter: Secondary | ICD-10-CM | POA: Diagnosis present

## 2022-11-02 DIAGNOSIS — E860 Dehydration: Secondary | ICD-10-CM | POA: Diagnosis present

## 2022-11-02 DIAGNOSIS — R531 Weakness: Secondary | ICD-10-CM | POA: Diagnosis not present

## 2022-11-02 DIAGNOSIS — Z841 Family history of disorders of kidney and ureter: Secondary | ICD-10-CM | POA: Diagnosis not present

## 2022-11-02 DIAGNOSIS — Y92002 Bathroom of unspecified non-institutional (private) residence single-family (private) house as the place of occurrence of the external cause: Secondary | ICD-10-CM

## 2022-11-02 DIAGNOSIS — E538 Deficiency of other specified B group vitamins: Secondary | ICD-10-CM | POA: Diagnosis not present

## 2022-11-02 DIAGNOSIS — I10 Essential (primary) hypertension: Secondary | ICD-10-CM | POA: Diagnosis present

## 2022-11-02 DIAGNOSIS — Z886 Allergy status to analgesic agent status: Secondary | ICD-10-CM

## 2022-11-02 DIAGNOSIS — N19 Unspecified kidney failure: Secondary | ICD-10-CM | POA: Diagnosis not present

## 2022-11-02 DIAGNOSIS — R29818 Other symptoms and signs involving the nervous system: Secondary | ICD-10-CM | POA: Diagnosis not present

## 2022-11-02 DIAGNOSIS — E8809 Other disorders of plasma-protein metabolism, not elsewhere classified: Secondary | ICD-10-CM | POA: Diagnosis present

## 2022-11-02 DIAGNOSIS — Z888 Allergy status to other drugs, medicaments and biological substances status: Secondary | ICD-10-CM

## 2022-11-02 DIAGNOSIS — I129 Hypertensive chronic kidney disease with stage 1 through stage 4 chronic kidney disease, or unspecified chronic kidney disease: Secondary | ICD-10-CM | POA: Diagnosis present

## 2022-11-02 DIAGNOSIS — S199XXA Unspecified injury of neck, initial encounter: Secondary | ICD-10-CM | POA: Diagnosis not present

## 2022-11-02 DIAGNOSIS — W19XXXA Unspecified fall, initial encounter: Secondary | ICD-10-CM

## 2022-11-02 DIAGNOSIS — N1831 Chronic kidney disease, stage 3a: Secondary | ICD-10-CM | POA: Diagnosis present

## 2022-11-02 DIAGNOSIS — I452 Bifascicular block: Secondary | ICD-10-CM | POA: Diagnosis not present

## 2022-11-02 DIAGNOSIS — E876 Hypokalemia: Secondary | ICD-10-CM | POA: Diagnosis present

## 2022-11-02 DIAGNOSIS — R627 Adult failure to thrive: Secondary | ICD-10-CM | POA: Diagnosis not present

## 2022-11-02 DIAGNOSIS — C61 Malignant neoplasm of prostate: Secondary | ICD-10-CM | POA: Diagnosis present

## 2022-11-02 DIAGNOSIS — I517 Cardiomegaly: Secondary | ICD-10-CM | POA: Diagnosis not present

## 2022-11-02 DIAGNOSIS — S0990XA Unspecified injury of head, initial encounter: Secondary | ICD-10-CM | POA: Diagnosis not present

## 2022-11-02 LAB — DIFFERENTIAL
Abs Immature Granulocytes: 0.03 10*3/uL (ref 0.00–0.07)
Basophils Absolute: 0 10*3/uL (ref 0.0–0.1)
Basophils Relative: 0 %
Eosinophils Absolute: 0 10*3/uL (ref 0.0–0.5)
Eosinophils Relative: 0 %
Immature Granulocytes: 0 %
Lymphocytes Relative: 24 %
Lymphs Abs: 1.9 10*3/uL (ref 0.7–4.0)
Monocytes Absolute: 1.3 10*3/uL — ABNORMAL HIGH (ref 0.1–1.0)
Monocytes Relative: 17 %
Neutro Abs: 4.7 10*3/uL (ref 1.7–7.7)
Neutrophils Relative %: 59 %

## 2022-11-02 LAB — I-STAT CHEM 8, ED
BUN: 19 mg/dL (ref 8–23)
Calcium, Ion: 1.15 mmol/L (ref 1.15–1.40)
Chloride: 105 mmol/L (ref 98–111)
Creatinine, Ser: 0.9 mg/dL (ref 0.61–1.24)
Glucose, Bld: 94 mg/dL (ref 70–99)
HCT: 38 % — ABNORMAL LOW (ref 39.0–52.0)
Hemoglobin: 12.9 g/dL — ABNORMAL LOW (ref 13.0–17.0)
Potassium: 3.8 mmol/L (ref 3.5–5.1)
Sodium: 139 mmol/L (ref 135–145)
TCO2: 26 mmol/L (ref 22–32)

## 2022-11-02 LAB — CBC
HCT: 39 % (ref 39.0–52.0)
Hemoglobin: 12.3 g/dL — ABNORMAL LOW (ref 13.0–17.0)
MCH: 31.5 pg (ref 26.0–34.0)
MCHC: 31.5 g/dL (ref 30.0–36.0)
MCV: 100 fL (ref 80.0–100.0)
Platelets: 132 10*3/uL — ABNORMAL LOW (ref 150–400)
RBC: 3.9 MIL/uL — ABNORMAL LOW (ref 4.22–5.81)
RDW: 12.9 % (ref 11.5–15.5)
WBC: 8 10*3/uL (ref 4.0–10.5)
nRBC: 0 % (ref 0.0–0.2)

## 2022-11-02 LAB — COMPREHENSIVE METABOLIC PANEL
ALT: 13 U/L (ref 0–44)
AST: 28 U/L (ref 15–41)
Albumin: 3.4 g/dL — ABNORMAL LOW (ref 3.5–5.0)
Alkaline Phosphatase: 54 U/L (ref 38–126)
Anion gap: 6 (ref 5–15)
BUN: 20 mg/dL (ref 8–23)
CO2: 23 mmol/L (ref 22–32)
Calcium: 8.3 mg/dL — ABNORMAL LOW (ref 8.9–10.3)
Chloride: 107 mmol/L (ref 98–111)
Creatinine, Ser: 0.91 mg/dL (ref 0.61–1.24)
GFR, Estimated: >60 mL/min (ref >60)
Glucose, Bld: 98 mg/dL (ref 70–99)
Potassium: 3.8 mmol/L (ref 3.5–5.1)
Sodium: 136 mmol/L (ref 135–145)
Total Bilirubin: 1 mg/dL (ref 0.3–1.2)
Total Protein: 6.4 g/dL — ABNORMAL LOW (ref 6.5–8.1)

## 2022-11-02 LAB — RESP PANEL BY RT-PCR (RSV, FLU A&B, COVID)  RVPGX2
Influenza A by PCR: NEGATIVE
Influenza B by PCR: NEGATIVE
Resp Syncytial Virus by PCR: NEGATIVE
SARS Coronavirus 2 by RT PCR: POSITIVE — AB

## 2022-11-02 LAB — CBG MONITORING, ED: Glucose-Capillary: 86 mg/dL (ref 70–99)

## 2022-11-02 LAB — CK: Total CK: 280 U/L (ref 49–397)

## 2022-11-02 MED ORDER — NIRMATRELVIR/RITONAVIR (PAXLOVID)TABLET: 3.0000 | ORAL_TABLET | Freq: Two times a day (BID) | ORAL | Status: DC

## 2022-11-02 MED ORDER — ONDANSETRON HCL 4 MG/2ML IJ SOLN: 4.0000 mg | Freq: Four times a day (QID) | INTRAMUSCULAR | Status: DC | PRN

## 2022-11-02 MED ORDER — ACETAMINOPHEN 325 MG PO TABS: 650.0000 mg | ORAL_TABLET | Freq: Four times a day (QID) | ORAL | Status: DC | PRN

## 2022-11-02 MED ORDER — BENZONATATE 100 MG PO CAPS: 100.0000 mg | ORAL_CAPSULE | Freq: Three times a day (TID) | ORAL | Status: DC | PRN

## 2022-11-02 MED ORDER — SODIUM CHLORIDE 0.9 % IV BOLUS
500.0000 mL | Freq: Once | INTRAVENOUS | Status: AC
  Administered 2022-11-02: 500 mL via INTRAVENOUS

## 2022-11-02 MED ORDER — MELATONIN 3 MG PO TABS: 3.0000 mg | ORAL_TABLET | Freq: Every evening | ORAL | Status: DC | PRN

## 2022-11-02 MED ORDER — ALBUTEROL SULFATE (2.5 MG/3ML) 0.083% IN NEBU: 2.5000 mg | INHALATION_SOLUTION | RESPIRATORY_TRACT | Status: DC | PRN

## 2022-11-02 MED ORDER — SODIUM CHLORIDE 0.9 % IV SOLN
100.0000 mL/h | INTRAVENOUS | Status: DC
  Administered 2022-11-03: 100 mL/h via INTRAVENOUS

## 2022-11-02 MED ORDER — ACETAMINOPHEN 650 MG RE SUPP: 650.0000 mg | Freq: Four times a day (QID) | RECTAL | Status: DC | PRN

## 2022-11-02 NOTE — ED Provider Notes (Signed)
[facility] EMERGENCY DEPARTMENT AT [facility] Provider Note   CSN: [id number] Arrival date & time: 11/02/22  1850     History  Chief Complaint  Patient presents with   Weakness   Fall   Failure To Thrive    Alexander Rich is a 89 y.o. male.   Weakness Fall   Patient has a history of hypertension, constipation, hip pain, metastatic prostate cancer who presents to the ED for evaluation of weakness.  Patient states yesterday Alexander Rich was feeling fine.  Today Alexander Rich went to the bathroom and was too weak and ended up falling off the toilet.  Alexander Rich was not able to get up.  Alexander Rich was found by his family members lying on the floor this afternoon.  Patient had been there since the morning.  Alexander Rich denies any loss of consciousness.  Denies any focal areas of numbness or weakness but feels weak all over    Home Medications Prior to Admission medications   Medication Sig Start Date End Date Taking? Authorizing Provider  abiraterone acetate (ZYTIGA) 250 MG tablet Take 4 tablets (1,000 mg total) by mouth daily on an empty stomach 1 hour before meals OR 2 hours after a meal 10/09/22   Malachy Mood, MD  acetaminophen (TYLENOL) 325 MG tablet Take 1 tablet (325 mg total) by mouth every 6 (six) hours as needed for mild pain (or Fever >/= 101). 03/25/22   Rodolph Bong, MD  ASCORBIC ACID PO Take 1 tablet by mouth in the morning. Vitamin C, unknown strength    [provider]  cyanocobalamin 1000 MCG tablet Take 1 tablet (1,000 mcg total) by mouth daily. 07/18/22   Lewie Chamber, MD  furosemide (LASIX) 20 MG tablet Take 1 tablet (20 mg total) by mouth daily as needed for fluid or edema. Patient taking differently: Take 20 mg by mouth daily. 03/25/22 07/15/22  Rodolph Bong, MD  linaclotide Karlene Einstein) 145 MCG CAPS capsule Take 145 mcg by mouth daily as needed (constipation).    [provider]  losartan (COZAAR) 100 MG tablet Take 1 tablet (100 mg total) by mouth in the morning. 03/30/22    Rodolph Bong, MD  oxyCODONE (OXY IR/ROXICODONE) 5 MG immediate release tablet Take 1-2 tablets (5-10 mg total) by mouth every 8 (eight) hours as needed for severe pain. 10/06/22   Pollyann Samples, NP  oxyCODONE-acetaminophen (PERCOCET/ROXICET) 5-325 MG tablet Take 2 tablets by mouth every 6 (six) hours as needed for pain. 08/15/19   [provider]  Polyethyl Glycol-Propyl Glycol (SYSTANE OP) Place 1 drop into both eyes 4 (four) times daily as needed (dry eyes).    [provider]  polyethylene glycol (MIRALAX / GLYCOLAX) 17 g packet Take 17 g by mouth daily as needed. Hold for diarrhea. Take when taking percocet 03/25/22   Rodolph Bong, MD  predniSONE (DELTASONE) 5 MG tablet Take 1 tablet (5 mg total) by mouth daily with breakfast. 10/06/22   Pollyann Samples, NP  tamsulosin (FLOMAX) 0.4 MG CAPS capsule Take 0.4 mg by mouth in the morning. 09/12/19   [provider]  VITAMIN D PO Take 1 tablet by mouth daily.    [provider]      Allergies    Ibuprofen, Nebivolol hcl, Pravastatin, Sertraline hcl, Simvastatin, and Trazodone    Review of Systems   Review of Systems  Neurological:  Positive for weakness.    Physical Exam Updated Vital Signs BP (!) 146/71   Pulse  74   Temp 100.2 F (37.9 C) (Oral)   Resp 16   SpO2 100%  Physical Exam Vitals and nursing note reviewed.  Constitutional:      Appearance: Alexander Rich is well-developed.     Comments: Elderly, frail  HENT:     Head: Normocephalic and atraumatic.     Right Ear: External ear normal.     Left Ear: External ear normal.  Eyes:     General: No scleral icterus.       Right eye: No discharge.        Left eye: No discharge.     Conjunctiva/sclera: Conjunctivae normal.  Neck:     Trachea: No tracheal deviation.  Cardiovascular:     Rate and Rhythm: Normal rate and regular rhythm.  Pulmonary:     Effort: Pulmonary effort is normal. No respiratory distress.     Breath sounds: Normal breath  sounds. No stridor. No wheezing or rales.  Abdominal:     General: Bowel sounds are normal. There is no distension.     Palpations: Abdomen is soft.     Tenderness: There is no abdominal tenderness. There is no guarding or rebound.  Musculoskeletal:        General: No tenderness or deformity.     Cervical back: Normal and neck supple.     Thoracic back: Normal.     Lumbar back: Normal.     Comments: Limited range of motion right shoulder, patient states this has been a chronic issue for several months  Skin:    General: Skin is warm and dry.     Findings: No rash.  Neurological:     General: No focal deficit present.     Mental Status: Alexander Rich is alert.     Cranial Nerves: No cranial nerve deficit, dysarthria or facial asymmetry.     Sensory: No sensory deficit.     Motor: Weakness present. No abnormal muscle tone or seizure activity.     Coordination: Coordination normal.     Comments: Able to lift arms and legs off the bed, weak doing so  Psychiatric:        Mood and Affect: Mood normal.     ED Results / Procedures / Treatments   Labs (all labs ordered are listed, but only abnormal results are displayed) Labs Reviewed  RESP PANEL BY RT-PCR (RSV, FLU A&B, COVID)  RVPGX2 - Abnormal; Notable for the following components:      Result Value   SARS Coronavirus 2 by RT PCR POSITIVE (*)    All other components within normal limits  CBC - Abnormal; Notable for the following components:   RBC 3.90 (*)    Hemoglobin 12.3 (*)    Platelets 132 (*)    All other components within normal limits  DIFFERENTIAL - Abnormal; Notable for the following components:   Monocytes Absolute 1.3 (*)    All other components within normal limits  COMPREHENSIVE METABOLIC PANEL - Abnormal; Notable for the following components:   Calcium 8.3 (*)    Total Protein 6.4 (*)    Albumin 3.4 (*)    All other components within normal limits  I-STAT CHEM 8, ED - Abnormal; Notable for the following components:    Hemoglobin 12.9 (*)    HCT 38.0 (*)    All other components within normal limits  CK  RAPID URINE DRUG SCREEN, HOSP PERFORMED  URINALYSIS, ROUTINE W REFLEX MICROSCOPIC  CBG MONITORING, ED    EKG EKG Interpretation Date/Time:  Monday November 02 2022 19:05:43 EDT Ventricular Rate:  74 PR Interval:  202 QRS Duration:  142 QT Interval:  420 QTC Calculation: 466 R Axis:   -68  Text Interpretation: Sinus rhythm RBBB and LAFB No significant change since last tracing Confirmed by Linwood Dibbles [phone]) on 11/02/2022 7:17:53 PM  Radiology CT HEAD WO CONTRAST  Result Date: 11/02/2022 CLINICAL DATA:  Neuro deficit, acute, stroke suspected; Neck trauma (Age >= 65y) EXAM: CT HEAD WITHOUT CONTRAST CT CERVICAL SPINE WITHOUT CONTRAST TECHNIQUE: Multidetector CT imaging of the head and cervical spine was performed following the standard protocol without intravenous contrast. Multiplanar CT image reconstructions of the cervical spine were also generated. RADIATION DOSE REDUCTION: This exam was performed according to the departmental dose-optimization program which includes automated exposure control, adjustment of the mA and/or kV according to patient size and/or use of iterative reconstruction technique. COMPARISON:  None Available. FINDINGS: CT HEAD FINDINGS Brain: No evidence of acute infarction, hemorrhage, hydrocephalus, extra-axial collection or mass lesion/mass effect. Vascular: No hyperdense vessel or unexpected calcification. Skull: Normal. Negative for fracture or focal lesion. Sinuses/Orbits: Moderate paranasal sinus mucosal thickening. No acute orbital findings. Other: No mastoid effusions. CT CERVICAL SPINE FINDINGS Alignment: No substantial sagittal subluxation. Skull base and vertebrae: T1 sclerosis, which appears similar to prior CT March 15, 24. no evidence of acute fracture. Soft tissues and spinal canal: No prevertebral fluid or swelling. No visible canal hematoma. Disc levels:  Mild for age  multilevel bony degenerative change. Upper chest: Visualized lung apices are clear. IMPRESSION: 1. No evidence of acute intracranial abnormality. 2. No evidence of acute fracture or traumatic malalignment in the cervical spine. 3. Chronic sclerotic T1 metastasis. Electronically Signed   By: Feliberto Harts M.D.   On: 11/02/2022 20:17   CT CERVICAL SPINE WO CONTRAST  Result Date: 11/02/2022 CLINICAL DATA:  Neuro deficit, acute, stroke suspected; Neck trauma (Age >= 65y) EXAM: CT HEAD WITHOUT CONTRAST CT CERVICAL SPINE WITHOUT CONTRAST TECHNIQUE: Multidetector CT imaging of the head and cervical spine was performed following the standard protocol without intravenous contrast. Multiplanar CT image reconstructions of the cervical spine were also generated. RADIATION DOSE REDUCTION: This exam was performed according to the departmental dose-optimization program which includes automated exposure control, adjustment of the mA and/or kV according to patient size and/or use of iterative reconstruction technique. COMPARISON:  None Available. FINDINGS: CT HEAD FINDINGS Brain: No evidence of acute infarction, hemorrhage, hydrocephalus, extra-axial collection or mass lesion/mass effect. Vascular: No hyperdense vessel or unexpected calcification. Skull: Normal. Negative for fracture or focal lesion. Sinuses/Orbits: Moderate paranasal sinus mucosal thickening. No acute orbital findings. Other: No mastoid effusions. CT CERVICAL SPINE FINDINGS Alignment: No substantial sagittal subluxation. Skull base and vertebrae: T1 sclerosis, which appears similar to prior CT March 15, 24. no evidence of acute fracture. Soft tissues and spinal canal: No prevertebral fluid or swelling. No visible canal hematoma. Disc levels:  Mild for age multilevel bony degenerative change. Upper chest: Visualized lung apices are clear. IMPRESSION: 1. No evidence of acute intracranial abnormality. 2. No evidence of acute fracture or traumatic malalignment  in the cervical spine. 3. Chronic sclerotic T1 metastasis. Electronically Signed   By: Feliberto Harts M.D.   On: 11/02/2022 20:17   DG Chest Portable 1 View  Result Date: 11/02/2022 CLINICAL DATA:  Weakness EXAM: PORTABLE CHEST 1 VIEW COMPARISON:  07/14/2022 FINDINGS: Lungs are clear. No pneumothorax or pleural effusion. Cardiac size appears mildly enlarged, though this is likely in central weighted by semi-erect positioning. Pulmonary  vascularity is normal. No acute bone abnormality. IMPRESSION: 1. No active disease. Electronically Signed   By: Helyn Numbers M.D.   On: 11/02/2022 20:14    Procedures Procedures    Medications Ordered in ED Medications  sodium chloride 0.9 % bolus 500 mL (500 mLs Intravenous New Bag/Given 11/02/22 2004)    Followed by  0.9 %  sodium chloride infusion (has no administration in time range)    ED Course/ Medical Decision Making/ A&P Clinical Course as of 11/02/22 2308  Mon Nov 02, 2022  2120 CBC(!) Hemoglobin stable compared to previous.  Metabolic panel normal.  CK normal [JK]  2121 Head CT and C-spine CT without acute abnormality [JK]  2206 CBC normal.  Metabolic panel normal. [JK]  2206 COVID test positive. [JK]  2308 Case discussed with Dr Arlean Hopping [JK]    Clinical Course User Index [JK] Linwood Dibbles, MD                             Medical Decision Making Problems Addressed: COVID: acute illness or injury that poses a threat to life or bodily functions Near syncope: acute illness or injury that poses a threat to life or bodily functions Prostate cancer metastatic to bone [facility]): chronic illness or injury with exacerbation, progression, or side effects of treatment Weakness: acute illness or injury that poses a threat to life or bodily functions  Amount and/or Complexity of Data Reviewed Labs: ordered. Decision-making details documented in ED Course. Radiology: ordered and independent interpretation performed.  Risk Prescription drug  management. Decision regarding hospitalization.   Patient presented to ED for evaluation of weakness.  Patient does have a history of metastatic prostate cancer.  Per previous oncology notes, if the patient's symptoms progress his option at that time would be hospice care.  Patient lives at home alone.  Alexander Rich came to the ED t today after fall off the commode.  Patient did not lose consciousness.  Patient was too weak to stand.  In the ED Alexander Rich does not have any focal deficits but is generally weak.  Patient is not anemic or severely dehydrated.  Patient is positive for COVID.  Possible this could be causing his acute weakness as well.  Patient does not have any oxygen requirement but with his low-grade temperature weakness covid virus infection Alexander Rich is not safe to go home.  I will consult the medical service for admission        Final Clinical Impression(s) / ED Diagnoses Final diagnoses:  Weakness  Prostate cancer metastatic to bone [facility])  Near syncope  COVID    Rx / DC Orders ED Discharge Orders     None         Linwood Dibbles, MD 11/02/22 2308

## 2022-11-02 NOTE — ED Triage Notes (Signed)
EMS reports from home, lives alone, c/o increasing weakness. Due to weakness Pt was unable to get up off of toilet and fell, found on floor this afternoon by family, states he was there since this morning. Denies head strike or LOC, no injury or blood thinners. Pt alert and oriented. Family requesting placement to due reduced ability to complete ADLs independently.  BP 160/74 HR 76 RR 16 Sp02 97  CBG 102

## 2022-11-02 NOTE — H&P (Signed)
History and Physical      Alexander Rich:[id number] DOB: 10-10-1925 DOA: 11/02/2022; DOS: 11/02/2022  PCP: Tally Joe, MD *** Patient coming from: home ***  I have personally briefly reviewed patient's old medical records in [facility]  Chief Complaint: ***  HPI: Alexander Rich is a 89 y.o. male with medical history significant for *** who is [facility] 11/02/2022 with *** after presenting from home*** to [facility] ED complaining of ***.   ***        ***  ED Course:  Vital signs in the ED were notable for the following: ***  Labs were notable for the following: ***  Per my interpretation, EKG in ED demonstrated the following:  ***  Imaging in the ED, per corresponding formal radiology read, was notable for the following: ***  While in the ED, the following were administered: ***  Subsequently, the patient was admitted  ***  ***red   Review of Systems: As per HPI otherwise 10 point review of systems negative.   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 He has never used smokeless tobacco. He reports that he does not drink alcohol and does not use drugs.   Allergies  Allergen Reactions   Ibuprofen Other (See Comments)    GI bleed   Nebivolol Hcl Other (See Comments)    feels "blah"; low energy   Pravastatin Other (See Comments)    Lethargy   Sertraline Hcl Other (See Comments)    Terrible feeling   Simvastatin Other (See Comments)    Lethargy   Trazodone Other (See  Comments)    Foggy headed    Family History  Problem Relation Age of Onset   Kidney disease Mother    CVA Father     Family history reviewed and not pertinent ***   Prior to Admission medications   Medication Sig Start Date End Date Taking? Authorizing Provider  abiraterone acetate (ZYTIGA) 250 MG tablet Take 4 tablets (1,000 mg total) by mouth daily on an empty stomach 1 hour before meals OR 2 hours after a meal 10/09/22   Malachy Mood, MD  acetaminophen (TYLENOL) 325 MG tablet Take 1 tablet (325 mg total) by mouth every 6 (six) hours as needed for mild pain (or Fever >/= 101). 03/25/22   Rodolph Bong, MD  ASCORBIC ACID PO Take 1 tablet by mouth in the morning. Vitamin C, unknown strength    [provider]  cyanocobalamin 1000 MCG tablet Take 1 tablet (1,000 mcg total) by mouth daily. 07/18/22   Lewie Chamber, MD  furosemide (LASIX) 20 MG tablet Take 1 tablet (20 mg total) by mouth daily as needed for fluid or edema. Patient taking differently: Take 20 mg by mouth daily. 03/25/22 07/15/22  Rodolph Bong, MD  linaclotide Karlene Einstein) 145 MCG CAPS capsule Take 145 mcg by mouth daily as needed (constipation).    [provider]  losartan (COZAAR) 100 MG tablet Take 1 tablet (100 mg total) by mouth in the  morning. 03/30/22   Rodolph Bong, MD  oxyCODONE (OXY IR/ROXICODONE) 5 MG immediate release tablet Take 1-2 tablets (5-10 mg total) by mouth every 8 (eight) hours as needed for severe pain. 10/06/22   Pollyann Samples, NP  oxyCODONE-acetaminophen (PERCOCET/ROXICET) 5-325 MG tablet Take 2 tablets by mouth every 6 (six) hours as needed for pain. 08/15/19   [provider]  Polyethyl Glycol-Propyl Glycol (SYSTANE OP) Place 1 drop into both eyes 4 (four) times daily as needed (dry eyes).    [provider]  polyethylene glycol (MIRALAX / GLYCOLAX) 17 g packet Take 17 g by mouth daily as needed. Hold for diarrhea. Take when taking percocet 03/25/22    Rodolph Bong, MD  predniSONE (DELTASONE) 5 MG tablet Take 1 tablet (5 mg total) by mouth daily with breakfast. 10/06/22   Pollyann Samples, NP  tamsulosin (FLOMAX) 0.4 MG CAPS capsule Take 0.4 mg by mouth in the morning. 09/12/19   [provider]  VITAMIN D PO Take 1 tablet by mouth daily.    [provider]     Objective    Physical Exam: Vitals:   11/02/22 1856 11/02/22 2200 11/02/22 2307  BP: (!) 146/71 (!) 127/55   Pulse: 74 65   Resp: 16 (!) 24   Temp: 100.2 F (37.9 C)  100.3 F (37.9 C)  TempSrc: Oral  Oral  SpO2: 100% 98%     General: appears to be stated age; alert, oriented Skin: warm, dry, no rash Head:  AT/[place] Mouth:  Oral mucosa membranes appear moist, normal dentition Neck: supple; trachea midline Heart:  RRR; did not appreciate any M/R/G Lungs: CTAB, did not appreciate any wheezes, rales, or rhonchi Abdomen: + BS; soft, ND, NT Vascular: 2+ pedal pulses b/l; 2+ radial pulses b/l Extremities: no peripheral edema, no muscle wasting Neuro: strength and sensation intact in upper and lower extremities b/l    *** Neuro: 5/5 strength of the proximal and distal flexors and extensors of the upper and lower extremities bilaterally; sensation intact in upper and lower extremities b/l; cranial nerves II through XII grossly intact; no pronator drift; no evidence suggestive of slurred speech, dysarthria, or facial droop; Normal muscle tone. No tremors. *** Neuro: In the setting of the patient's current mental status and associated inability to follow instructions, unable to perform full neurologic exam at this time.  As such, assessment of strength, sensation, and cranial nerves is limited at this time. Patient noted to spontaneously move all 4 extremities. No tremors.  ***    Labs on Admission: I have personally reviewed following labs and imaging studies  CBC: Recent Labs  Lab 11/02/22 2003 11/02/22 2013  WBC 8.0  --   NEUTROABS 4.7  --    HGB 12.3* 12.9*  HCT 39.0 38.0*  MCV 100.0  --   PLT 132*  --    Basic Metabolic Panel: Recent Labs  Lab 11/02/22 2003 11/02/22 2013  NA 136 139  K 3.8 3.8  CL 107 105  CO2 23  --   GLUCOSE 98 94  BUN 20 19  CREATININE 0.91 0.90  CALCIUM 8.3*  --    GFR: CrCl cannot be calculated (Unknown ideal weight.). Liver Function Tests: Recent Labs  Lab 11/02/22 2003  AST 28  ALT 13  ALKPHOS 54  BILITOT 1.0  PROT 6.4*  ALBUMIN 3.4*   No results for input(s): "LIPASE", "AMYLASE" in the last 168 hours. No results for input(s): "AMMONIA" in the last 168  hours. Coagulation Profile: No results for input(s): "INR", "PROTIME" in the last 168 hours. Cardiac Enzymes: Recent Labs  Lab 11/02/22 2003  CKTOTAL 280   BNP (last 3 results) No results for input(s): "PROBNP" in the last 8760 hours. HbA1C: No results for input(s): "HGBA1C" in the last 72 hours. CBG: Recent Labs  Lab 11/02/22 1902  GLUCAP 86   Lipid Profile: No results for input(s): "CHOL", "HDL", "LDLCALC", "TRIG", "CHOLHDL", "LDLDIRECT" in the last 72 hours. Thyroid Function Tests: No results for input(s): "TSH", "T4TOTAL", "FREET4", "T3FREE", "THYROIDAB" in the last 72 hours. Anemia Panel: No results for input(s): "VITAMINB12", "FOLATE", "FERRITIN", "TIBC", "IRON", "RETICCTPCT" in the last 72 hours. Urine analysis: No results found for: "COLORURINE", "APPEARANCEUR", "LABSPEC", "PHURINE", "GLUCOSEU", "HGBUR", "BILIRUBINUR", "KETONESUR", "PROTEINUR", "UROBILINOGEN", "NITRITE", "LEUKOCYTESUR"  Radiological Exams on Admission: CT HEAD WO CONTRAST  Result Date: 11/02/2022 CLINICAL DATA:  Neuro deficit, acute, stroke suspected; Neck trauma (Age >= 65y) EXAM: CT HEAD WITHOUT CONTRAST CT CERVICAL SPINE WITHOUT CONTRAST TECHNIQUE: Multidetector CT imaging of the head and cervical spine was performed following the standard protocol without intravenous contrast. Multiplanar CT image reconstructions of the cervical spine  were also generated. RADIATION DOSE REDUCTION: This exam was performed according to the departmental dose-optimization program which includes automated exposure control, adjustment of the mA and/or kV according to patient size and/or use of iterative reconstruction technique. COMPARISON:  None Available. FINDINGS: CT HEAD FINDINGS Brain: No evidence of acute infarction, hemorrhage, hydrocephalus, extra-axial collection or mass lesion/mass effect. Vascular: No hyperdense vessel or unexpected calcification. Skull: Normal. Negative for fracture or focal lesion. Sinuses/Orbits: Moderate paranasal sinus mucosal thickening. No acute orbital findings. Other: No mastoid effusions. CT CERVICAL SPINE FINDINGS Alignment: No substantial sagittal subluxation. Skull base and vertebrae: T1 sclerosis, which appears similar to prior CT March 15, 24. no evidence of acute fracture. Soft tissues and spinal canal: No prevertebral fluid or swelling. No visible canal hematoma. Disc levels:  Mild for age multilevel bony degenerative change. Upper chest: Visualized lung apices are clear. IMPRESSION: 1. No evidence of acute intracranial abnormality. 2. No evidence of acute fracture or traumatic malalignment in the cervical spine. 3. Chronic sclerotic T1 metastasis. Electronically Signed   By: Feliberto Harts M.D.   On: 11/02/2022 20:17   CT CERVICAL SPINE WO CONTRAST  Result Date: 11/02/2022 CLINICAL DATA:  Neuro deficit, acute, stroke suspected; Neck trauma (Age >= 65y) EXAM: CT HEAD WITHOUT CONTRAST CT CERVICAL SPINE WITHOUT CONTRAST TECHNIQUE: Multidetector CT imaging of the head and cervical spine was performed following the standard protocol without intravenous contrast. Multiplanar CT image reconstructions of the cervical spine were also generated. RADIATION DOSE REDUCTION: This exam was performed according to the departmental dose-optimization program which includes automated exposure control, adjustment of the mA and/or kV  according to patient size and/or use of iterative reconstruction technique. COMPARISON:  None Available. FINDINGS: CT HEAD FINDINGS Brain: No evidence of acute infarction, hemorrhage, hydrocephalus, extra-axial collection or mass lesion/mass effect. Vascular: No hyperdense vessel or unexpected calcification. Skull: Normal. Negative for fracture or focal lesion. Sinuses/Orbits: Moderate paranasal sinus mucosal thickening. No acute orbital findings. Other: No mastoid effusions. CT CERVICAL SPINE FINDINGS Alignment: No substantial sagittal subluxation. Skull base and vertebrae: T1 sclerosis, which appears similar to prior CT March 15, 24. no evidence of acute fracture. Soft tissues and spinal canal: No prevertebral fluid or swelling. No visible canal hematoma. Disc levels:  Mild for age multilevel bony degenerative change. Upper chest: Visualized lung apices are clear. IMPRESSION: 1. No evidence of  acute intracranial abnormality. 2. No evidence of acute fracture or traumatic malalignment in the cervical spine. 3. Chronic sclerotic T1 metastasis. Electronically Signed   By: Feliberto Harts M.D.   On: 11/02/2022 20:17   DG Chest Portable 1 View  Result Date: 11/02/2022 CLINICAL DATA:  Weakness EXAM: PORTABLE CHEST 1 VIEW COMPARISON:  07/14/2022 FINDINGS: Lungs are clear. No pneumothorax or pleural effusion. Cardiac size appears mildly enlarged, though this is likely in central weighted by semi-erect positioning. Pulmonary vascularity is normal. No acute bone abnormality. IMPRESSION: 1. No active disease. Electronically Signed   By: Helyn Numbers M.D.   On: 11/02/2022 20:14      Assessment/Plan    Principal Problem:   Generalized weakness  ***              ***                ***                  ***INCIDENTAL COVID; SYMPTOMS, LESS THAN 5 DAYS DURATION #) Incidental positive COVID-19 result: COVID-19 PCR performed in the ED today found to be positive, which  appears to meet criteria for incidental finding, as COVID-19 is not the primary reason for admission.  However, in the context of increased risk for progression of COVID-19 infection in the setting of patient's age and multiple comorbidities, including  ***, criteria appear to be met in the setting of this incidental COVID-19 finding with symptoms of  *** starting less than than 5 days ago, for initiation of Paxlovid.  Not on any DOAC's, although use of statin is noted. will require holding of the statin over the course of paxlovid treatment.  Does not meet criteria for sepsis.  No evidence of acute hypoxia to warrant initiation of systemic corticosteroids.  *** In setting of DM2, will start linagliptin 5 mg PO Qdaily for associated mortality benefit.     Plan: Start paxlovid , as above.  prn albuterol.  Check/trend CRP.  Repeat CBC/CMP in the AM.  Airborne precautions.  Flutter valve/incentive spirometry. prn supplemental O2 to maintain O2 sats greater than or equal to 94%. PRN acetaminophen for fever. Check serum mag and phos levels. Check procalcitonin, which, if non-elevated in the context of the pro inflammatory state associated with COVID-19, would provide a high degree of negative predictive value that would further decrease the likelihood of any contribution from bacterial pneumonia.   *** paxlovid  *** molnupiravir *** start linagliptin 5 mg PO Qdaily, as above.                  ***               ***              ***               ***               ***               ***               ***               ***               ***              ***     DVT prophylaxis: SCD's ***  Code Status: Full code*** Family Communication: none***  Disposition Plan: Per Rounding Team Consults called: none***;  Admission status: ***    I SPENT GREATER THAN 75  *** MINUTES IN CLINICAL CARE TIME/MEDICAL DECISION-MAKING IN COMPLETING THIS ADMISSION.     Chaney Born Hartleigh Edmonston DO [facility] From 7PM - 7AM   11/02/2022, 11:10 PM   ***

## 2022-11-03 ENCOUNTER — Encounter (HOSPITAL_COMMUNITY): Payer: Self-pay | Admitting: Internal Medicine

## 2022-11-03 DIAGNOSIS — U071 COVID-19: Secondary | ICD-10-CM | POA: Diagnosis present

## 2022-11-03 DIAGNOSIS — R531 Weakness: Secondary | ICD-10-CM | POA: Diagnosis not present

## 2022-11-03 DIAGNOSIS — N19 Unspecified kidney failure: Secondary | ICD-10-CM | POA: Diagnosis present

## 2022-11-03 DIAGNOSIS — E860 Dehydration: Secondary | ICD-10-CM | POA: Diagnosis present

## 2022-11-03 DIAGNOSIS — Y92009 Unspecified place in unspecified non-institutional (private) residence as the place of occurrence of the external cause: Secondary | ICD-10-CM

## 2022-11-03 LAB — URINALYSIS, ROUTINE W REFLEX MICROSCOPIC
Bacteria, UA: NONE SEEN
Bilirubin Urine: NEGATIVE
Glucose, UA: NEGATIVE mg/dL
Hgb urine dipstick: NEGATIVE
Ketones, ur: 5 mg/dL — AB
Leukocytes,Ua: NEGATIVE
Nitrite: NEGATIVE
Protein, ur: 30 mg/dL — AB
Specific Gravity, Urine: 1.019 (ref 1.005–1.030)
pH: 5 (ref 5.0–8.0)

## 2022-11-03 LAB — CBC WITH DIFFERENTIAL/PLATELET
Abs Immature Granulocytes: 0.02 10*3/uL (ref 0.00–0.07)
Basophils Absolute: 0 10*3/uL (ref 0.0–0.1)
Basophils Relative: 0 %
Eosinophils Absolute: 0 10*3/uL (ref 0.0–0.5)
Eosinophils Relative: 0 %
HCT: 36.2 % — ABNORMAL LOW (ref 39.0–52.0)
Hemoglobin: 11.5 g/dL — ABNORMAL LOW (ref 13.0–17.0)
Immature Granulocytes: 0 %
Lymphocytes Relative: 32 %
Lymphs Abs: 1.8 10*3/uL (ref 0.7–4.0)
MCH: 31.9 pg (ref 26.0–34.0)
MCHC: 31.8 g/dL (ref 30.0–36.0)
MCV: 100.6 fL — ABNORMAL HIGH (ref 80.0–100.0)
Monocytes Absolute: 1.2 10*3/uL — ABNORMAL HIGH (ref 0.1–1.0)
Monocytes Relative: 21 %
Neutro Abs: 2.5 10*3/uL (ref 1.7–7.7)
Neutrophils Relative %: 47 %
Platelets: 104 10*3/uL — ABNORMAL LOW (ref 150–400)
RBC: 3.6 MIL/uL — ABNORMAL LOW (ref 4.22–5.81)
RDW: 12.9 % (ref 11.5–15.5)
WBC: 5.5 10*3/uL (ref 4.0–10.5)
nRBC: 0 % (ref 0.0–0.2)

## 2022-11-03 LAB — RAPID URINE DRUG SCREEN, HOSP PERFORMED
Amphetamines: NOT DETECTED
Barbiturates: NOT DETECTED
Benzodiazepines: NOT DETECTED
Cocaine: NOT DETECTED
Opiates: NOT DETECTED
Tetrahydrocannabinol: NOT DETECTED

## 2022-11-03 LAB — COMPREHENSIVE METABOLIC PANEL
ALT: 13 U/L (ref 0–44)
AST: 32 U/L (ref 15–41)
Albumin: 2.8 g/dL — ABNORMAL LOW (ref 3.5–5.0)
Alkaline Phosphatase: 45 U/L (ref 38–126)
Anion gap: 7 (ref 5–15)
BUN: 17 mg/dL (ref 8–23)
CO2: 20 mmol/L — ABNORMAL LOW (ref 22–32)
Calcium: 7.7 mg/dL — ABNORMAL LOW (ref 8.9–10.3)
Chloride: 108 mmol/L (ref 98–111)
Creatinine, Ser: 0.77 mg/dL (ref 0.61–1.24)
GFR, Estimated: >60 mL/min (ref >60)
Glucose, Bld: 93 mg/dL (ref 70–99)
Potassium: 3.4 mmol/L — ABNORMAL LOW (ref 3.5–5.1)
Sodium: 135 mmol/L (ref 135–145)
Total Bilirubin: 1.2 mg/dL (ref 0.3–1.2)
Total Protein: 5.6 g/dL — ABNORMAL LOW (ref 6.5–8.1)

## 2022-11-03 LAB — C-REACTIVE PROTEIN
CRP: 6.5 mg/dL — ABNORMAL HIGH (ref <1.0)
CRP: 7.7 mg/dL — ABNORMAL HIGH (ref <1.0)

## 2022-11-03 LAB — TSH: TSH: 1.15 u[IU]/mL (ref 0.350–4.500)

## 2022-11-03 LAB — MAGNESIUM
Magnesium: 2.3 mg/dL (ref 1.7–2.4)
Magnesium: 2 mg/dL (ref 1.7–2.4)

## 2022-11-03 LAB — VITAMIN B12: Vitamin B-12: 207 pg/mL (ref 180–914)

## 2022-11-03 MED ORDER — OXYCODONE HCL 5 MG PO TABS: 5.0000 mg | ORAL_TABLET | Freq: Three times a day (TID) | ORAL | Status: DC | PRN

## 2022-11-03 MED ORDER — TAMSULOSIN HCL 0.4 MG PO CAPS
0.4000 mg | ORAL_CAPSULE | Freq: Every morning | ORAL | Status: DC
  Administered 2022-11-03 – 2022-11-04 (×2): 0.4 mg via ORAL
  Filled 2022-11-03 (×2): qty 1

## 2022-11-03 MED ORDER — POTASSIUM CHLORIDE CRYS ER 20 MEQ PO TBCR
20.0000 meq | EXTENDED_RELEASE_TABLET | Freq: Once | ORAL | Status: AC
  Administered 2022-11-03: 20 meq via ORAL
  Filled 2022-11-03: qty 1

## 2022-11-03 MED ORDER — NIRMATRELVIR/RITONAVIR (PAXLOVID) TABLET (RENAL DOSING)
2.0000 | ORAL_TABLET | Freq: Two times a day (BID) | ORAL | Status: DC
  Administered 2022-11-03 – 2022-11-04 (×3): 2 via ORAL
  Filled 2022-11-03: qty 20

## 2022-11-03 MED ORDER — LACTATED RINGERS IV SOLN: INTRAVENOUS | Status: DC

## 2022-11-03 MED ORDER — PREDNISONE 5 MG PO TABS
5.0000 mg | ORAL_TABLET | Freq: Every day | ORAL | Status: DC
  Administered 2022-11-03 – 2022-11-04 (×2): 5 mg via ORAL
  Filled 2022-11-03 (×2): qty 1

## 2022-11-03 MED ORDER — ABIRATERONE ACETATE 250 MG PO TABS: 1000.0000 mg | ORAL_TABLET | Freq: Every day | ORAL | Status: DC

## 2022-11-03 MED ORDER — CYANOCOBALAMIN 1000 MCG/ML IJ SOLN
1000.0000 ug | INTRAMUSCULAR | Status: DC
  Administered 2022-11-03: 1000 ug via INTRAMUSCULAR
  Filled 2022-11-03: qty 1

## 2022-11-03 NOTE — Evaluation (Signed)
Physical Therapy Evaluation Patient Details Name: Alexander Rich MRN: [id number] DOB: [birth year] Today's Date: 11/03/2022  History of Present Illness  89 y.o. male with medical history significant for metastatic prostate cancer to the bone, essential hypertension, who is admitted to [facility] on 11/02/2022 with generalized weakness after presenting from home to [facility] ED complaining of generalized weakness.  Pt had fall from toilet to floor on day of admission, staying on floor for hours until family discovered him. COVID-19 positive finding with admission.  Imaging showed Chronic sclerotic T1 metastasis but no acute infarct.  Clinical Impression  On eval, pt required Min A for mobility. He walked ~100 feet with a RW. O2 >90% on  RA. Pt presents with general weakness, decreased activity tolerance. He is motivated to regain his PLOF. Will plan to follow and progress activity as tolerated. Pt will benefit from post acute rehab after this hospital stay.         Assistance Recommended at Discharge Intermittent Supervision/Assistance  If plan is discharge home, recommend the following:  Can travel by private vehicle  A little help with walking and/or transfers;A little help with bathing/dressing/bathroom;Assistance with cooking/housework;Assist for transportation;Help with stairs or ramp for entrance        Equipment Recommendations None recommended by PT  Recommendations for Other Services       Functional Status Assessment Patient has had a recent decline in their functional status and demonstrates the ability to make significant improvements in function in a reasonable and predictable amount of time.     Precautions / Restrictions Precautions Precautions: Fall Precaution Comments: Airborn/Contact precautions. Restrictions Weight Bearing Restrictions: No      Mobility  Bed Mobility               General bed mobility comments: oob in recliner    Transfers Overall  transfer level: Needs assistance Equipment used: Rolling walker (2 wheels) Transfers: Sit to/from Stand Sit to Stand: Min assist           General transfer comment: Assist to Alexander up , stabilize, control descent. Cues for safety, technique, hand placement.    Ambulation/Gait Ambulation/Gait assistance: Min guard Gait Distance (Feet): 100 Feet Assistive device: Rolling walker (2 wheels) Gait Pattern/deviations: Step-through pattern, Decreased stride length       General Gait Details: MIn guard For safety. No LOB with RW use. Tolerated distance well. O2 >90%  on RA  Stairs            Wheelchair Mobility     Tilt Bed    Modified Rankin (Stroke Patients Only)       Balance Overall balance assessment: Needs assistance         Standing balance support: Reliant on assistive device for balance, Bilateral upper extremity supported, During functional activity Standing balance-Leahy Scale: Fair                               Pertinent Vitals/Pain Pain Assessment Pain Assessment: No/denies pain    Home Living Family/patient expects to be discharged to:: Private residence Living Arrangements: Alone Available Help at Discharge: Family;Available PRN/intermittently Type of Home: House Home Access: Stairs to enter Entrance Stairs-Rails: Right Entrance Stairs-Number of Steps: 3   Home Layout: One level Home Equipment: Agricultural consultant (2 wheels);Cane - single point;Rollator (4 wheels) Additional Comments: RW one in house and one in car,    Prior Function Prior Level of Function : Independent/Modified  Independent;History of Falls (last six months);Driving             Mobility Comments: pt reports recently using rollator in home. ADLs Comments: patient was doing own grocery shopping.pt reports mod I with all ADLs, self care tasks wtih the exception of doning socks and just skips the socks, IADLs and driving.     Hand Dominance         Extremity/Trunk Assessment   Upper Extremity Assessment Upper Extremity Assessment: Defer to OT evaluation RUE Deficits / Details: Shoulder ROM limitations and discomfort with eccentric movements at shoulder which pt reports is new since fall. AAROM WFL. Elbow->hand WFL    Lower Extremity Assessment Lower Extremity Assessment: Generalized weakness    Cervical / Trunk Assessment Cervical / Trunk Assessment: Normal  Communication   Communication: No difficulties  Cognition Arousal/Alertness: Awake/alert Behavior During Therapy: WFL for tasks assessed/performed Overall Cognitive Status: Within Functional Limits for tasks assessed                                          General Comments      Exercises     Assessment/Plan    PT Assessment Patient needs continued PT services  PT Problem List Decreased strength;Decreased range of motion;Decreased activity tolerance;Decreased balance;Decreased mobility;Decreased knowledge of use of DME       PT Treatment Interventions DME instruction;Gait training;Therapeutic exercise;Balance training;Functional mobility training;Therapeutic activities;Patient/family education    PT Goals (Current goals can be found in the Care Plan section)  Acute Rehab PT Goals Patient Stated Goal: to regain PLOF/independence PT Goal Formulation: With patient Time For Goal Achievement: 11/17/22 Potential to Achieve Goals: Good    Frequency Min 1X/week     Co-evaluation               AM-PAC PT "6 Clicks" Mobility  Outcome Measure Help needed turning from your back to your side while in a flat bed without using bedrails?: A Little Help needed moving from lying on your back to sitting on the side of a flat bed without using bedrails?: A Little Help needed moving to and from a bed to a chair (including a wheelchair)?: A Little Help needed standing up from a chair using your arms (e.g., wheelchair or bedside chair)?: A Little Help  needed to walk in hospital room?: A Little Help needed climbing 3-5 steps with a railing? : A Little 6 Click Score: 18    End of Session Equipment Utilized During Treatment: Gait belt Activity Tolerance: Patient tolerated treatment well Patient left: in chair;with call bell/phone within reach;with chair alarm set   PT Visit Diagnosis: Difficulty in walking, not elsewhere classified (R26.2);Muscle weakness (generalized) (M62.81)    Time: 1610-9604 PT Time Calculation (min) (ACUTE ONLY): 14 min   Charges:   PT Evaluation $PT Eval Low Complexity: 1 Low   PT General Charges $$ ACUTE PT VISIT: 1 Visit           Faye Ramsay, PT Acute Rehabilitation  Office: [phone]

## 2022-11-03 NOTE — Progress Notes (Signed)
Prior-To-Admission Oral Chemotherapy for Treatment of Oncologic Disease   Order noted from Dr. Arlean Hopping to continue prior-to-admission oral chemotherapy regimen of Zytiga.  Procedure Per Pharmacy & Therapeutics Committee Policy: Orders for continuation of home oral chemotherapy for treatment of an oncologic disease will be held unless approved by an oncologist during current admission.    For patients receiving oncology care at [facility], inpatient pharmacist contacts patient's oncologist during regular office hours to review. If earlier review is medically necessary, attending physician consults [facility] on-call oncologist   For patients receiving oncology care outside of [facility], attending physician consults patient's oncologist to review. If this oncologist or their coverage cannot be reached, attending physician consults [facility] on-call oncologist   Oral chemotherapy continuation order is on hold pending oncologist review, [facility] oncologist Mosetta Putt will be notified by inpatient pharmacy during office hours   Herby Abraham, Pharm.D Use secure chat for questions 11/03/2022 7:56 AM

## 2022-11-03 NOTE — Progress Notes (Signed)
Prior-To-Admission Oral Chemotherapy for Treatment of Oncologic Disease   Per d/w Dr Bertis Ruddy (covering for Dr Feng)> Hold Zytiga for now.  Herby Abraham, Pharm.D Use secure chat for questions 11/03/2022 9:20 AM

## 2022-11-03 NOTE — Evaluation (Signed)
Occupational Therapy Evaluation Patient Details Name: Alexander Rich MRN: [id number] DOB: 03-Aug-1925 Today's Date: 11/03/2022   History of Present Illness Alexander Rich is a 89 y.o. male with medical history significant for metastatic prostate cancer to the bone, essential hypertension, who is admitted to [facility] on 11/02/2022 with generalized weakness after presenting from home to [facility] ED complaining of generalized weakness.  Pt had fall from toilet to floor on day of admission, staying on floor for hours until family discovered him. COVID-19 positive finding with admission.  Imaging showed Chronic sclerotic T1 metastasis but no acute infarct.   Clinical Impression   Patient is currently requiring assistance with ADLs including up to minimal assist with Lower body ADLs with exception of socks which pt does not wear at home, and up to Minimal assist with Upper body ADLs in part due to his Rt shoulder ROM limitations, as well as supervision with bed mobility and up to Minimal assist with functional transfers to toilet.   Current level of function is below patient's typical baseline.  During this evaluation, patient was limited by generalized weakness, impaired activity tolerance, and RT shoulder limitations, all of which has the potential to impact patient's safety and independence during functional mobility, as well as performance for ADLs.  Patient lives alone, with a nephew nearby who is able to provide PRN supervision and assistance, and is looking into an ILF or ALF for  the pt.  Patient demonstrates good rehab potential, and should benefit from continued skilled occupational therapy services while in acute care to maximize safety, independence and quality of life at home.  Continued occupational therapy services are recommended.  ?       Recommendations for follow up therapy are one component of a multi-disciplinary discharge planning process, led by the attending physician.   Recommendations may be updated based on patient status, additional functional criteria and insurance authorization.   Assistance Recommended at Discharge Intermittent Supervision/Assistance  Patient can return home with the following A little help with bathing/dressing/bathroom;A little help with walking and/or transfers;Assistance with cooking/housework    Functional Status Assessment  Patient has had a recent decline in their functional status and demonstrates the ability to make significant improvements in function in a reasonable and predictable amount of time.  Equipment Recommendations  Other (comment) (Medical alert button if pt to return home prior to going to ILF or ALF.  Pt educated to this but could benefit from reinforcement.)    Recommendations for Other Services       Precautions / Restrictions Precautions Precautions: Fall Precaution Comments: Airborn/Contact precautions.      Mobility Bed Mobility Overal bed mobility: Needs Assistance Bed Mobility: Supine to Sit     Supine to sit: HOB elevated, Supervision     General bed mobility comments: Increased time/effort.    Transfers                          Balance Overall balance assessment: Needs assistance Sitting-balance support: Feet supported Sitting balance-Leahy Scale: Good     Standing balance support: Reliant on assistive device for balance, Bilateral upper extremity supported Standing balance-Leahy Scale: Poor                             ADL either performed or assessed with clinical judgement   ADL Overall ADL's : Needs assistance/impaired Eating/Feeding: Independent   Grooming: Wash/dry hands;Standing;Cueing for  safety;Supervision/safety Grooming Details (indicate cue type and reason): Cues for safe positioning of RW anterior to sink. Pt declined oral care  but setup with basin, toothbrush/toothpaste once in recliner so it is available to him. Upper Body Bathing:  Supervision/ safety;Set up;Sitting   Lower Body Bathing: Minimal assistance;Sitting/lateral leans;Sit to/from stand   Upper Body Dressing : Set up;Sitting   Lower Body Dressing: Minimal assistance;Sit to/from stand;Sitting/lateral leans Lower Body Dressing Details (indicate cue type and reason): Total Assist to don socks which is baseline. Toilet Transfer: Ambulation;Rolling walker (2 wheels);Minimal assistance;Regular Toilet;Grab bars Toilet Transfer Details (indicate cue type and reason): Pt stood from low EOB to RW with Min Assist (light), ambulated to bathroom with RW and Min guard assist. Min As to slow descent to toilet. Toileting- Architect and Hygiene: Min guard;Sitting/lateral lean       Functional mobility during ADLs: Min guard;Minimal assistance;Cueing for safety;Rolling walker (2 wheels)       Vision Baseline Vision/History:  (able to read clock on wall) Vision Assessment?: No apparent visual deficits Additional Comments: Eyes appear red-rimmed or irritated.     Perception     Praxis      Pertinent Vitals/Pain Pain Assessment Pain Assessment: Faces Faces Pain Scale: Hurts little more Pain Location: RT shoulder with eccentric Pain Intervention(s): Limited activity within patient's tolerance, Monitored during session, Repositioned     Hand Dominance Right   Extremity/Trunk Assessment Upper Extremity Assessment Upper Extremity Assessment: Generalized weakness RUE Deficits / Details: Shoulder ROM limitations and discomfort with eccentric movements at shoulder which pt reports is new since fall. AAROM WFL. Elbow->hand [facility]   Lower Extremity Assessment Lower Extremity Assessment: Defer to PT evaluation       Communication Communication Communication: No difficulties   Cognition Arousal/Alertness: Awake/alert Behavior During Therapy: WFL for tasks assessed/performed Overall Cognitive Status: Impaired/Different from baseline Area of Impairment:  Orientation                 Orientation Level: Time, Disoriented to (Not month. Unable to guess what month)                   General Comments       Exercises Other Exercises Other Exercises: Pt shown self range using LUE to move RUE for shoulder flexion to avoid stiffness and limited ROM later. Pt demonstrated back correctly.   Shoulder Instructions      Home Living Family/patient expects to be discharged to:: Private residence Living Arrangements: Alone Available Help at Discharge: Family;Available PRN/intermittently (Nephew is availalbe PRN. Nephew is looking into ALFs) Type of Home: House Home Access: Stairs to enter [employer] of Steps: 3 Entrance Stairs-Rails: Right Home Layout: One level     Bathroom Shower/Tub: Chief Strategy Officer: Standard     Home Equipment: Agricultural consultant (2 wheels);Cane - single point;Rollator (4 wheels)   Additional Comments: RW one in house and one in car,      Prior Functioning/Environment Prior Level of Function : Independent/Modified Independent;History of Falls (last six months);Driving             Mobility Comments: pt reports mod I with all ADLs, self care tasks wtih the exception of doning socks and just skips the socks, IADLs and driving. pt reports recently using rollator in home. ADLs Comments: patient was doing own grocery shopping.        OT Problem List: Decreased range of motion;Decreased activity tolerance;Decreased safety awareness;Decreased knowledge of use of DME or AE;Impaired balance (  sitting and/or standing);Pain;Impaired UE functional use;Cardiopulmonary status limiting activity      OT Treatment/Interventions: Self-care/ADL training;Therapeutic activities;Therapeutic exercise;Patient/family education;Energy conservation;DME and/or AE instruction;Balance training    OT Goals(Current goals can be found in the care plan section) Acute Rehab OT Goals Patient Stated Goal: Go  home. OT Goal Formulation: With patient Time For Goal Achievement: 11/17/22 Potential to Achieve Goals: Good ADL Goals Pt Will Perform Grooming: with modified independence;standing Pt Will Perform Lower Body Dressing: with modified independence;with adaptive equipment;sitting/lateral leans;sit to/from stand Pt Will Transfer to Toilet: with modified independence;ambulating Pt/caregiver will Perform Home Exercise Program: Increased ROM;Increased strength;Both right and left upper extremity (Gentle ROM as tolerated to RT shoulder to increase ROM.) Additional ADL Goal #1: Patient will identify at least 3 energy conservation strategies to employ at home in order to maximize function and quality of life and decrease caregiver burden while preventing exacerbation of symptoms and rehospitalization.  OT Frequency: Min 1X/week    Co-evaluation              AM-PAC OT "6 Clicks" Daily Activity     Outcome Measure Help from another person eating meals?: None Help from another person taking care of personal grooming?: A Little Help from another person toileting, which includes using toliet, bedpan, or urinal?: A Little Help from another person bathing (including washing, rinsing, drying)?: A Little Help from another person to put on and taking off regular upper body clothing?: A Little Help from another person to put on and taking off regular lower body clothing?: A Little 6 Click Score: 19   End of Session Equipment Utilized During Treatment: Gait belt;Rolling walker (2 wheels) Nurse Communication: Other (comment) (Up to chair)  Activity Tolerance: Patient tolerated treatment well Patient left: in chair;with call bell/phone within reach;with chair alarm set  OT Visit Diagnosis: Muscle weakness (generalized) (M62.81);Pain Pain - Right/Left: Right Pain - part of body: Shoulder                Time: 1610-9604 OT Time Calculation (min): 41 min Charges:  OT General Charges $OT Visit: 1  Visit OT Evaluation $OT Eval Low Complexity: 1 Low OT Treatments $Self Care/Home Management : 8-22 mins $Therapeutic Activity: 8-22 mins  Victorino Dike, OT Acute Rehab Services Office: [phone] 11/03/2022  Theodoro Clock 11/03/2022, 11:53 AM

## 2022-11-03 NOTE — ED Notes (Signed)
ED TO INPATIENT HANDOFF REPORT  Name/Age/Gender Alexander Rich 89 y.o. male  Code Status    Code Status Orders  (From admission, onward)           Start     Ordered   11/02/22 2303  Full code  Continuous       Question:  By:  Answer:  Consent: discussion documented in EHR   11/02/22 2302           Code Status History     Date Active Date Inactive Code Status Order ID Comments User Context   07/14/2022 2105 07/18/2022 2118 Full Code [id number]  Lewie Chamber, MD ED   03/21/2022 1140 03/25/2022 2258 Full Code [id number]  Bobette Mo, MD ED       Home/SNF/Other Home  pt from home, possible need for assisted living facility due to increased weakness and inability to perform ADLs  Chief Complaint Generalized weakness [R53.1]  Level of Care/Admitting Diagnosis ED Disposition     ED Disposition  Admit   Condition  --   Comment  Hospital Area: [facility] [[id number]]  Level of Care: Telemetry [5]  Admit to tele based on following criteria: Monitor for Ischemic changes  May place patient in observation at [facility] or [facility] if equivalent level of care is available:: No  Covid Evaluation: Asymptomatic - no recent exposure (last 10 days) testing not required  Diagnosis: Generalized weakness [782956]  Admitting Physician: Angie Fava [[id number]]  Attending Physician: Angie Fava [[id number]]          Medical History Past Medical History:  Diagnosis Date   Constipation    Hip pain    Hyperpigmented skin lesions 03/21/2022   Hypertension    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    Allergies Allergies  Allergen Reactions   Ibuprofen Other (See Comments)    GI bleed   Nebivolol Hcl Other (See Comments)    feels "blah"; low energy   Pravastatin Other (See Comments)    Lethargy   Sertraline Hcl Other (See Comments)    Terrible feeling   Simvastatin Other (See Comments)    Lethargy   Trazodone  Other (See Comments)    Foggy headed    IV Location/Drains/Wounds Patient Lines/Drains/Airways Status     Active Line/Drains/Airways     Name Placement date Placement time Site Days   Peripheral IV 11/02/22 20 G Anterior;Right Antecubital 11/02/22  2003  Antecubital  1            Labs/Imaging Results for orders placed or performed during the hospital encounter of 11/02/22 (from the past 48 hour(s))  Urine rapid drug screen (hosp performed)     Status: None   Collection Time: 11/02/22 12:07 AM  Result Value Ref Range   Opiates NONE DETECTED NONE DETECTED   Cocaine NONE DETECTED NONE DETECTED   Benzodiazepines NONE DETECTED NONE DETECTED   Amphetamines NONE DETECTED NONE DETECTED   Tetrahydrocannabinol NONE DETECTED NONE DETECTED   Barbiturates NONE DETECTED NONE DETECTED    Comment: (NOTE) DRUG SCREEN FOR MEDICAL PURPOSES ONLY.  IF CONFIRMATION IS NEEDED FOR ANY PURPOSE, NOTIFY LAB WITHIN 5 DAYS.  LOWEST DETECTABLE LIMITS FOR URINE DRUG SCREEN Drug Class                     Cutoff (ng/mL) Amphetamine and metabolites    1000 Barbiturate and metabolites    200 Benzodiazepine  200 Opiates and metabolites        300 Cocaine and metabolites        300 THC                            50 Performed at [facility], [address], [place], [place] [address]   CBG monitoring, ED     Status: None   Collection Time: 11/02/22  7:02 PM  Result Value Ref Range   Glucose-Capillary 86 70 - 99 mg/dL    Comment: Glucose reference range applies only to samples taken after fasting for at least 8 hours.  Magnesium     Status: None   Collection Time: 11/02/22  8:01 PM  Result Value Ref Range   Magnesium 2.3 1.7 - 2.4 mg/dL    Comment: Performed at [facility], [address], [place], [place] [address]  Resp panel by RT-PCR (RSV, Flu A&B, Covid) Anterior Nasal Swab     Status: Abnormal   Collection Time: 11/02/22  8:03 PM    Specimen: Anterior Nasal Swab  Result Value Ref Range   SARS Coronavirus 2 by RT PCR POSITIVE (A) NEGATIVE    Comment: (NOTE) SARS-CoV-2 target nucleic acids are DETECTED.  The SARS-CoV-2 RNA is generally detectable in upper respiratory specimens during the acute phase of infection. Positive results are indicative of the presence of the identified virus, but do not rule out bacterial infection or co-infection with other pathogens not detected by the test. Clinical correlation with patient history and other diagnostic information is necessary to determine patient infection status. The expected result is Negative.  Fact Sheet for Patients: BloggerCourse.com  Fact Sheet for Healthcare Providers: SeriousBroker.it  This test is not yet approved or cleared by the [place] FDA and  has been authorized for detection and/or diagnosis of SARS-CoV-2 by FDA under an Emergency Use Authorization (EUA).  This EUA will remain in effect (meaning this test can be used) for the duration of  the COVID-19 declaration under Section 564(b)(1) of the A ct, 21 U.S.C. section 360bbb-3(b)(1), unless the authorization is terminated or revoked sooner.     Influenza A by PCR NEGATIVE NEGATIVE   Influenza B by PCR NEGATIVE NEGATIVE    Comment: (NOTE) The Xpert Xpress SARS-CoV-2/FLU/RSV plus assay is intended as an aid in the diagnosis of influenza from Nasopharyngeal swab specimens and should not be used as a sole basis for treatment. Nasal washings and aspirates are unacceptable for Xpert Xpress SARS-CoV-2/FLU/RSV testing.  Fact Sheet for Patients: BloggerCourse.com  Fact Sheet for Healthcare Providers: SeriousBroker.it  This test is not yet approved or cleared by the [place] FDA and has been authorized for detection and/or diagnosis of SARS-CoV-2 by FDA under an Emergency Use Authorization  (EUA). This EUA will remain in effect (meaning this test can be used) for the duration of the COVID-19 declaration under Section 564(b)(1) of the Act, 21 U.S.C. section 360bbb-3(b)(1), unless the authorization is terminated or revoked.     Resp Syncytial Virus by PCR NEGATIVE NEGATIVE    Comment: (NOTE) Fact Sheet for Patients: BloggerCourse.com  Fact Sheet for Healthcare Providers: SeriousBroker.it  This test is not yet approved or cleared by the [place] FDA and has been authorized for detection and/or diagnosis of SARS-CoV-2 by FDA under an Emergency Use Authorization (EUA). This EUA will remain in effect (meaning this test can be used) for the duration of the COVID-19 declaration  under Section 564(b)(1) of the Act, 21 U.S.C. section 360bbb-3(b)(1), unless the authorization is terminated or revoked.  Performed at [facility], [address] [place], [place] [address]   CBC     Status: Abnormal   Collection Time: 11/02/22  8:03 PM  Result Value Ref Range   WBC 8.0 4.0 - 10.5 K/uL   RBC 3.90 (L) 4.22 - 5.81 MIL/uL   Hemoglobin 12.3 (L) 13.0 - 17.0 g/dL   HCT 60.4 54.0 - 98.1 %   MCV 100.0 80.0 - 100.0 fL   MCH 31.5 26.0 - 34.0 pg   MCHC 31.5 30.0 - 36.0 g/dL   RDW 19.1 47.8 - 29.5 %   Platelets 132 (L) 150 - 400 K/uL   nRBC 0.0 0.0 - 0.2 %    Comment: Performed at [facility], [address] [place], [place] [address]  Differential     Status: Abnormal   Collection Time: 11/02/22  8:03 PM  Result Value Ref Range   Neutrophils Relative % 59 %   Neutro Abs 4.7 1.7 - 7.7 K/uL   Lymphocytes Relative 24 %   Lymphs Abs 1.9 0.7 - 4.0 K/uL   Monocytes Relative 17 %   Monocytes Absolute 1.3 (H) 0.1 - 1.0 K/uL   Eosinophils Relative 0 %   Eosinophils Absolute 0.0 0.0 - 0.5 K/uL   Basophils Relative 0 %   Basophils Absolute 0.0 0.0 - 0.1 K/uL   Immature Granulocytes 0 %   Abs  Immature Granulocytes 0.03 0.00 - 0.07 K/uL    Comment: Performed at [facility], [address], [place], [place] [address]  Comprehensive metabolic panel     Status: Abnormal   Collection Time: 11/02/22  8:03 PM  Result Value Ref Range   Sodium 136 135 - 145 mmol/L   Potassium 3.8 3.5 - 5.1 mmol/L   Chloride 107 98 - 111 mmol/L   CO2 23 22 - 32 mmol/L   Glucose, Bld 98 70 - 99 mg/dL    Comment: Glucose reference range applies only to samples taken after fasting for at least 8 hours.   BUN 20 8 - 23 mg/dL   Creatinine, Ser 4.69 0.61 - 1.24 mg/dL   Calcium 8.3 (L) 8.9 - 10.3 mg/dL   Total Protein 6.4 (L) 6.5 - 8.1 g/dL   Albumin 3.4 (L) 3.5 - 5.0 g/dL   AST 28 15 - 41 U/L   ALT 13 0 - 44 U/L   Alkaline Phosphatase 54 38 - 126 U/L   Total Bilirubin 1.0 0.3 - 1.2 mg/dL   GFR, Estimated >62 >95 mL/min    Comment: (NOTE) Calculated using the CKD-EPI Creatinine Equation (2021)    Anion gap 6 5 - 15    Comment: Performed at [facility], [address] [place], [place] [address]  CK     Status: None   Collection Time: 11/02/22  8:03 PM  Result Value Ref Range   Total CK 280 49 - 397 U/L    Comment: Performed at [facility], [address], [place], [place] [address]  I-stat chem 8, ED     Status: Abnormal   Collection Time: 11/02/22  8:13 PM  Result Value Ref Range   Sodium 139 135 - 145 mmol/L   Potassium 3.8 3.5 - 5.1 mmol/L   Chloride 105 98 - 111 mmol/L   BUN 19 8 - 23 mg/dL   Creatinine, Ser 0.27  0.61 - 1.24 mg/dL   Glucose, Bld 94 70 - 99 mg/dL    Comment: Glucose reference range applies only to samples taken after fasting for at least 8 hours.   Calcium, Ion 1.15 1.15 - 1.40 mmol/L   TCO2 26 22 - 32 mmol/L   Hemoglobin 12.9 (L) 13.0 - 17.0 g/dL   HCT 29.5 (L) 28.4 - 13.2 %  Urinalysis, Routine w reflex microscopic -Urine, Clean Catch     Status: Abnormal   Collection Time: 11/02/22 11:43 PM  Result Value Ref  Range   Color, Urine YELLOW YELLOW   APPearance HAZY (A) CLEAR   Specific Gravity, Urine 1.019 1.005 - 1.030   pH 5.0 5.0 - 8.0   Glucose, UA NEGATIVE NEGATIVE mg/dL   Hgb urine dipstick NEGATIVE NEGATIVE   Bilirubin Urine NEGATIVE NEGATIVE   Ketones, ur 5 (A) NEGATIVE mg/dL   Protein, ur 30 (A) NEGATIVE mg/dL   Nitrite NEGATIVE NEGATIVE   Leukocytes,Ua NEGATIVE NEGATIVE   RBC / HPF 11-20 0 - 5 RBC/hpf   WBC, UA 6-10 0 - 5 WBC/hpf   Bacteria, UA NONE SEEN NONE SEEN   Squamous Epithelial / HPF 0-5 0 - 5 /HPF   Mucus PRESENT     Comment: Performed at [facility], [address], [place], [place] [address]   CT HEAD WO CONTRAST  Result Date: 11/02/2022 CLINICAL DATA:  Neuro deficit, acute, stroke suspected; Neck trauma (Age >= 65y) EXAM: CT HEAD WITHOUT CONTRAST CT CERVICAL SPINE WITHOUT CONTRAST TECHNIQUE: Multidetector CT imaging of the head and cervical spine was performed following the standard protocol without intravenous contrast. Multiplanar CT image reconstructions of the cervical spine were also generated. RADIATION DOSE REDUCTION: This exam was performed according to the departmental dose-optimization program which includes automated exposure control, adjustment of the mA and/or kV according to patient size and/or use of iterative reconstruction technique. COMPARISON:  None Available. FINDINGS: CT HEAD FINDINGS Brain: No evidence of acute infarction, hemorrhage, hydrocephalus, extra-axial collection or mass lesion/mass effect. Vascular: No hyperdense vessel or unexpected calcification. Skull: Normal. Negative for fracture or focal lesion. Sinuses/Orbits: Moderate paranasal sinus mucosal thickening. No acute orbital findings. Other: No mastoid effusions. CT CERVICAL SPINE FINDINGS Alignment: No substantial sagittal subluxation. Skull base and vertebrae: T1 sclerosis, which appears similar to prior CT March 15, 24. no evidence of acute fracture. Soft tissues and spinal  canal: No prevertebral fluid or swelling. No visible canal hematoma. Disc levels:  Mild for age multilevel bony degenerative change. Upper chest: Visualized lung apices are clear. IMPRESSION: 1. No evidence of acute intracranial abnormality. 2. No evidence of acute fracture or traumatic malalignment in the cervical spine. 3. Chronic sclerotic T1 metastasis. Electronically Signed   By: Feliberto Harts M.D.   On: 11/02/2022 20:17   CT CERVICAL SPINE WO CONTRAST  Result Date: 11/02/2022 CLINICAL DATA:  Neuro deficit, acute, stroke suspected; Neck trauma (Age >= 65y) EXAM: CT HEAD WITHOUT CONTRAST CT CERVICAL SPINE WITHOUT CONTRAST TECHNIQUE: Multidetector CT imaging of the head and cervical spine was performed following the standard protocol without intravenous contrast. Multiplanar CT image reconstructions of the cervical spine were also generated. RADIATION DOSE REDUCTION: This exam was performed according to the departmental dose-optimization program which includes automated exposure control, adjustment of the mA and/or kV according to patient size and/or use of iterative reconstruction technique. COMPARISON:  None Available. FINDINGS: CT HEAD FINDINGS Brain: No evidence of acute infarction, hemorrhage, hydrocephalus, extra-axial collection or mass lesion/mass effect. Vascular: No  hyperdense vessel or unexpected calcification. Skull: Normal. Negative for fracture or focal lesion. Sinuses/Orbits: Moderate paranasal sinus mucosal thickening. No acute orbital findings. Other: No mastoid effusions. CT CERVICAL SPINE FINDINGS Alignment: No substantial sagittal subluxation. Skull base and vertebrae: T1 sclerosis, which appears similar to prior CT March 15, 24. no evidence of acute fracture. Soft tissues and spinal canal: No prevertebral fluid or swelling. No visible canal hematoma. Disc levels:  Mild for age multilevel bony degenerative change. Upper chest: Visualized lung apices are clear. IMPRESSION: 1. No evidence  of acute intracranial abnormality. 2. No evidence of acute fracture or traumatic malalignment in the cervical spine. 3. Chronic sclerotic T1 metastasis. Electronically Signed   By: Feliberto Harts M.D.   On: 11/02/2022 20:17   DG Chest Portable 1 View  Result Date: 11/02/2022 CLINICAL DATA:  Weakness EXAM: PORTABLE CHEST 1 VIEW COMPARISON:  07/14/2022 FINDINGS: Lungs are clear. No pneumothorax or pleural effusion. Cardiac size appears mildly enlarged, though this is likely in central weighted by semi-erect positioning. Pulmonary vascularity is normal. No acute bone abnormality. IMPRESSION: 1. No active disease. Electronically Signed   By: Helyn Numbers M.D.   On: 11/02/2022 20:14    Pending Labs Unresulted Labs (From admission, onward)     Start     Ordered   11/03/22 0500  CBC with Differential/Platelet  Tomorrow morning,   R        11/02/22 2303   11/03/22 0500  Comprehensive metabolic panel  Tomorrow morning,   R        11/02/22 2303   11/03/22 0500  Magnesium  Tomorrow morning,   R        11/02/22 2303   11/03/22 0500  C-reactive protein  Tomorrow morning,   R        11/02/22 2308   11/02/22 2309  C-reactive protein  Add-on,   AD        11/02/22 2308            Vitals/Pain Today's Vitals   11/03/22 0025 11/03/22 0030 11/03/22 0100 11/03/22 0128  BP:  (!) 132/58 119/67   Pulse: 65 70 81   Resp:  12    Temp:    99.3 F (37.4 C)  TempSrc:      SpO2: 97% 96% 99%   PainSc:        Isolation Precautions Airborne and Contact precautions  Medications Medications  sodium chloride 0.9 % bolus 500 mL (0 mLs Intravenous Stopped 11/03/22 0030)    Followed by  0.9 %  sodium chloride infusion (has no administration in time range)  acetaminophen (TYLENOL) tablet 650 mg (has no administration in time range)    Or  acetaminophen (TYLENOL) suppository 650 mg (has no administration in time range)  melatonin tablet 3 mg (has no administration in time range)  ondansetron (ZOFRAN)  injection 4 mg (has no administration in time range)  nirmatrelvir/ritonavir (PAXLOVID) 3 tablet (has no administration in time range)  benzonatate (TESSALON) capsule 100 mg (has no administration in time range)  albuterol (PROVENTIL) (2.5 MG/3ML) 0.083% nebulizer solution 2.5 mg (has no administration in time range)    Mobility walks with person assist

## 2022-11-03 NOTE — Progress Notes (Signed)
[facility] PROGRESS NOTE  Alexander Rich (DOB: 02-Aug-1925) ZOX:[id number] PCP: Tally Joe, MD  Brief Narrative: Alexander Rich is a 89 y.o. male with a history of metastatic prostate CA, HTN who presented to the ED on 11/02/2022 after falling from the toilet at home and being unable to get back up. He reported generalized weakness. CT head and C spine were nonacute and labs reassuring. Covid swab was positive. Due to persistent weakness, he was admitted and started on paxlovid with PT evaluation pending.   Subjective: Fearful of covid diagnosis as his brother died from it "6 years ago." Denies shortness of breath but does have a cough when directly asked. No chest pain. No focal weakness. No numbness.   Objective: BP 131/66 (BP Location: Left Arm)   Pulse 84 Comment: in reclinder after activity. HR during activity in low 90s.  Temp 98.1 F (36.7 C)   Resp 20   Wt 75.6 kg   SpO2 100%   BMI 26.10 kg/m   Gen: Elderly male in no distress Pulm: Clear, nonlabored  CV: RRR, no MRG, no edema this AM. GI: Soft, NT, ND, +BS Neuro: Alert and oriented, HOH. No new focal deficits. Ext: Warm, no deformities Skin: No rashes, lesions or ulcers on visualized skin   Assessment & Plan: Covid-19 infection: No respiratory symptoms or infiltrate on CXR. CRP is elevated to 6.5. While there is no pneumonia, I suspect this is the initial cause for weakness ultimately leading to fall and admission.  - Continue paxlovid x5 days. No indication for augmentation of chronic steroids. - Contact/airborne precautions per protocol  Generalized weakness: Negative neuroimaging. CK normal.  - PT/OT consulted. Given that he lives alone, suspect he may need escalation of care.  Metastatic prostate CA to bone: Followed by Dr. Mosetta Putt. - Hold abiraterone while dealing with acute infection. D/w Dr. Bertis Ruddy today.  - Continue flomax. There are WBCs and RBCs in UA but no bacteria and pt does not report urinary  symptoms.  - Continue prednisone 5mg   HTN:  - Hold daily lasix and ARB for now with clinical evidence of dehydration. Likely to be able to restart in 24 hours or so.  Vitamin B12 deficiency: Oral supplement listed on home meds, but level is at lower limit of normal at 207.  - Would advise parenteral administration, ordered for IM here.  Stage IIIa CKD: Suspected based on creatinine values. Cr trended downward since admission consistent with prerenal azotemia.  - Renally dose paxlovid - Avoid nephrotoxins - Reduce rate of IVF, could stop if he takes adequate po today. - Monitor.   Hypokalemia:  - Supplement.  Tyrone Nine, MD [facility] [email or web address] 11/03/2022, 12:15 PM

## 2022-11-04 ENCOUNTER — Telehealth: Payer: Self-pay

## 2022-11-04 ENCOUNTER — Telehealth: Payer: Self-pay | Admitting: Hematology

## 2022-11-04 ENCOUNTER — Other Ambulatory Visit: Payer: Self-pay

## 2022-11-04 DIAGNOSIS — C7951 Secondary malignant neoplasm of bone: Secondary | ICD-10-CM

## 2022-11-04 DIAGNOSIS — R627 Adult failure to thrive: Secondary | ICD-10-CM | POA: Diagnosis present

## 2022-11-04 DIAGNOSIS — E860 Dehydration: Secondary | ICD-10-CM | POA: Diagnosis present

## 2022-11-04 DIAGNOSIS — Z888 Allergy status to other drugs, medicaments and biological substances status: Secondary | ICD-10-CM | POA: Diagnosis not present

## 2022-11-04 DIAGNOSIS — W1811XA Fall from or off toilet without subsequent striking against object, initial encounter: Secondary | ICD-10-CM | POA: Diagnosis present

## 2022-11-04 DIAGNOSIS — E876 Hypokalemia: Secondary | ICD-10-CM | POA: Diagnosis present

## 2022-11-04 DIAGNOSIS — Z841 Family history of disorders of kidney and ureter: Secondary | ICD-10-CM | POA: Diagnosis not present

## 2022-11-04 DIAGNOSIS — C61 Malignant neoplasm of prostate: Secondary | ICD-10-CM | POA: Diagnosis present

## 2022-11-04 DIAGNOSIS — U071 COVID-19: Secondary | ICD-10-CM | POA: Diagnosis present

## 2022-11-04 DIAGNOSIS — Z823 Family history of stroke: Secondary | ICD-10-CM | POA: Diagnosis not present

## 2022-11-04 DIAGNOSIS — I129 Hypertensive chronic kidney disease with stage 1 through stage 4 chronic kidney disease, or unspecified chronic kidney disease: Secondary | ICD-10-CM | POA: Diagnosis present

## 2022-11-04 DIAGNOSIS — I452 Bifascicular block: Secondary | ICD-10-CM | POA: Diagnosis present

## 2022-11-04 DIAGNOSIS — E538 Deficiency of other specified B group vitamins: Secondary | ICD-10-CM | POA: Diagnosis present

## 2022-11-04 DIAGNOSIS — R531 Weakness: Secondary | ICD-10-CM | POA: Diagnosis present

## 2022-11-04 DIAGNOSIS — N1831 Chronic kidney disease, stage 3a: Secondary | ICD-10-CM | POA: Diagnosis present

## 2022-11-04 DIAGNOSIS — E8809 Other disorders of plasma-protein metabolism, not elsewhere classified: Secondary | ICD-10-CM | POA: Diagnosis present

## 2022-11-04 DIAGNOSIS — Z7952 Long term (current) use of systemic steroids: Secondary | ICD-10-CM | POA: Diagnosis not present

## 2022-11-04 DIAGNOSIS — Y92002 Bathroom of unspecified non-institutional (private) residence single-family (private) house as the place of occurrence of the external cause: Secondary | ICD-10-CM | POA: Diagnosis not present

## 2022-11-04 DIAGNOSIS — Z79899 Other long term (current) drug therapy: Secondary | ICD-10-CM | POA: Diagnosis not present

## 2022-11-04 DIAGNOSIS — Z886 Allergy status to analgesic agent status: Secondary | ICD-10-CM | POA: Diagnosis not present

## 2022-11-04 LAB — BASIC METABOLIC PANEL
Anion gap: 8 (ref 5–15)
BUN: 14 mg/dL (ref 8–23)
CO2: 19 mmol/L — ABNORMAL LOW (ref 22–32)
Calcium: 7.4 mg/dL — ABNORMAL LOW (ref 8.9–10.3)
Chloride: 107 mmol/L (ref 98–111)
Creatinine, Ser: 0.61 mg/dL (ref 0.61–1.24)
GFR, Estimated: >60 mL/min (ref >60)
Glucose, Bld: 104 mg/dL — ABNORMAL HIGH (ref 70–99)
Potassium: 3.4 mmol/L — ABNORMAL LOW (ref 3.5–5.1)
Sodium: 134 mmol/L — ABNORMAL LOW (ref 135–145)

## 2022-11-04 MED ORDER — NIRMATRELVIR/RITONAVIR (PAXLOVID) TABLET (RENAL DOSING): 2.0000 | ORAL_TABLET | Freq: Two times a day (BID) | ORAL | 0 refills | Status: AC

## 2022-11-04 MED ORDER — POTASSIUM CHLORIDE CRYS ER 20 MEQ PO TBCR
40.0000 meq | EXTENDED_RELEASE_TABLET | Freq: Once | ORAL | Status: AC
  Administered 2022-11-04: 40 meq via ORAL
  Filled 2022-11-04: qty 2

## 2022-11-04 NOTE — TOC Transition Note (Signed)
Transition of Care [facility]) - CM/SW Discharge Note   Patient Details  Name: Alexander Rich MRN: [id number] Date of Birth: Sep 06, 1925  Transition of Care [facility] CM/SW Contact:  Otelia Santee, LCSW Phone Number: 11/04/2022, 3:17 PM   Clinical Narrative:    Pt lives at home alone. Pt originally requesting placement however, was able to understand that qualifications for SNF are not met. Pt is agreeable to [facility] and reports having it in the past but, is unsure of which agency. HHPT/OT has been arranged with [facility]. Spoke with pt's nephew who has concerns with pt discharging home alone. Pt's nephew unable to provide transportation as pt is COVID positive and he is caregiver for his wife who is high risk. Pt's nephew agreed to unlock pt's house door this evening between 5-6pm. Taxi voucher placed on pt's chart for transportation at discharge.   Final next level of care: Home w Home Health Services Barriers to Discharge: No Barriers Identified   Patient Goals and CMS Choice CMS Medicare.gov Compare Post Acute Care list provided to:: Patient Choice offered to / list presented to : Patient  Discharge Placement                         Discharge Plan and Services Additional resources added to the After Visit Summary for                  DME Arranged: N/A DME Agency: NA       HH Arranged: PT, OT HH Agency: [facility] Date [facility] Agency Contacted: 11/04/22 Time HH Agency Contacted: [phone] Representative spoke with at [facility] Agency: Cindie  Social Determinants of Health (SDOH) Interventions SDOH Screenings   Food Insecurity: No Food Insecurity (11/03/2022)  Housing: Low Risk  (11/03/2022)  Transportation Needs: No Transportation Needs (11/03/2022)  Utilities: Not At Risk (11/03/2022)  Tobacco Use: Low Risk  (11/03/2022)     Readmission Risk Interventions    11/04/2022    1:30 PM  Readmission Risk Prevention Plan  Transportation Screening Complete  PCP or Specialist Appt  within 3-5 Days Complete  HRI or Home Care Consult Complete  Social Work Consult for Recovery Care Planning/Counseling Complete  Palliative Care Screening Not Applicable  Medication Review Oceanographer) Complete

## 2022-11-04 NOTE — Plan of Care (Signed)
Pt alert and oriented x 4 at beginning of shift. As night progressed disoriented to time. Pt 99% RA. Pt taking meds whole. Vitals stable. No prns given.  Problem: Education: Goal: Knowledge of risk factors and measures for prevention of condition will improve Outcome: Progressing   Problem: Coping: Goal: Psychosocial and spiritual needs will be supported Outcome: Progressing   Problem: Respiratory: Goal: Will maintain a patent airway Outcome: Progressing Goal: Complications related to the disease process, condition or treatment will be avoided or minimized Outcome: Progressing   Problem: Education: Goal: Knowledge of General Education information will improve Description: Including pain rating scale, medication(s)/side effects and non-pharmacologic comfort measures Outcome: Progressing   Problem: Health Behavior/Discharge Planning: Goal: Ability to manage health-related needs will improve Outcome: Progressing   Problem: Clinical Measurements: Goal: Ability to maintain clinical measurements within normal limits will improve Outcome: Progressing Goal: Will remain free from infection Outcome: Progressing Goal: Diagnostic test results will improve Outcome: Progressing Goal: Respiratory complications will improve Outcome: Progressing Goal: Cardiovascular complication will be avoided Outcome: Progressing   Problem: Activity: Goal: Risk for activity intolerance will decrease Outcome: Progressing   Problem: Nutrition: Goal: Adequate nutrition will be maintained Outcome: Progressing   Problem: Coping: Goal: Level of anxiety will decrease Outcome: Progressing   Problem: Elimination: Goal: Will not experience complications related to bowel motility Outcome: Progressing Goal: Will not experience complications related to urinary retention Outcome: Progressing   Problem: Pain Managment: Goal: General experience of comfort will improve Outcome: Progressing   Problem:  Safety: Goal: Ability to remain free from injury will improve Outcome: Progressing   Problem: Skin Integrity: Goal: Risk for impaired skin integrity will decrease Outcome: Progressing

## 2022-11-04 NOTE — Progress Notes (Signed)
Physical Therapy Treatment Patient Details Name: Alexander Rich MRN: [id number] DOB: 21-Jul-1925 Today's Date: 11/04/2022   History of Present Illness 89 y.o. male with medical history significant for metastatic prostate cancer to the bone, essential hypertension, who is admitted to [facility] on 11/02/2022 with generalized weakness after presenting from home to [facility] ED complaining of generalized weakness.  Pt had fall from toilet to floor on day of admission, staying on floor for hours until family discovered him. COVID-19 positive finding with admission.  Imaging showed Chronic sclerotic T1 metastasis but no acute infarct.    PT Comments  Pt agreeable to working with therapy. He reports he stll does not feel his best. He tolerated activity well. Recommend daily ambulation with nursing/mobility team as able.      Assistance Recommended at Discharge Intermittent Supervision/Assistance  If plan is discharge home, recommend the following:  Can travel by private vehicle    A little help with walking and/or transfers;A little help with bathing/dressing/bathroom;Assistance with cooking/housework;Assist for transportation;Help with stairs or ramp for entrance      Equipment Recommendations  None recommended by PT    Recommendations for Other Services       Precautions / Restrictions Precautions Precautions: Fall Precaution Comments: Airborn/Contact precautions. Restrictions Weight Bearing Restrictions: No     Mobility  Bed Mobility Overal bed mobility: Needs Assistance Bed Mobility: Supine to Sit     Supine to sit: Supervision, HOB elevated     General bed mobility comments: Supv for lines only. Increased time    Transfers Overall transfer level: Needs assistance Equipment used: Rolling walker (2 wheels) Transfers: Sit to/from Stand Sit to Stand: Supervision           General transfer comment: Increased time. Supv for hand placement.     Ambulation/Gait Ambulation/Gait assistance: Supervision Gait Distance (Feet): 250 Feet Assistive device: Rollator (4 wheels) Gait Pattern/deviations: Step-through pattern, Decreased stride length       General Gait Details: Supv for safety. Good gait speed. No LOB with rollator use. Dyspnea 2/4   Stairs             Wheelchair Mobility     Tilt Bed    Modified Rankin (Stroke Patients Only)       Balance Overall balance assessment: Needs assistance         Standing balance support: Reliant on assistive device for balance, Bilateral upper extremity supported, During functional activity Standing balance-Leahy Scale: Fair                              Cognition Arousal/Alertness: Awake/alert Behavior During Therapy: WFL for tasks assessed/performed Overall Cognitive Status: Within Functional Limits for tasks assessed                                          Exercises      General Comments        Pertinent Vitals/Pain Pain Assessment Pain Assessment: No/denies pain    Home Living                          Prior Function            PT Goals (current goals can now be found in the care plan section) Progress towards PT goals: Progressing toward goals  Frequency    Min 1X/week      PT Plan Current plan remains appropriate    Co-evaluation              AM-PAC PT "6 Clicks" Mobility   Outcome Measure  Help needed turning from your back to your side while in a flat bed without using bedrails?: None Help needed moving from lying on your back to sitting on the side of a flat bed without using bedrails?: A Little Help needed moving to and from a bed to a chair (including a wheelchair)?: A Little Help needed standing up from a chair using your arms (e.g., wheelchair or bedside chair)?: A Little Help needed to walk in hospital room?: A Little Help needed climbing 3-5 steps with a railing? : A  Little 6 Click Score: 19    End of Session Equipment Utilized During Treatment: Gait belt Activity Tolerance: Patient tolerated treatment well Patient left: in chair;with call bell/phone within reach;with chair alarm set   PT Visit Diagnosis: Difficulty in walking, not elsewhere classified (R26.2);Muscle weakness (generalized) (M62.81)     Time: 6440-3474 PT Time Calculation (min) (ACUTE ONLY): 23 min  Charges:    $Gait Training: 23-37 mins PT General Charges $$ ACUTE PT VISIT: 1 Visit                         Faye Ramsay, PT Acute Rehabilitation  Office: [phone]

## 2022-11-04 NOTE — Discharge Summary (Signed)
Physician Discharge Summary  Alexander Rich ZOX:[id number] DOB: 01-16-1926 DOA: 11/02/2022  PCP: Tally Joe, MD  Admit date: 11/02/2022 Discharge date: 11/04/2022  Admitted From: Home Disposition: Home  Recommendations for Outpatient Follow-up:  Follow up with PCP in 1-2 weeks Continue Paxlovid to complete 5-day course for Cova-19 viral infection  Home Health: PT/OT Equipment/Devices: None  Discharge Condition: Stable CODE STATUS: Full code Diet recommendation: Heart healthy diet  History of present illness:  Alexander Rich is a 89 y.o. male with a history of metastatic prostate CA, HTN who presented to the ED on 11/02/2022 after falling from the toilet at home and being unable to get back up. He reported generalized weakness. CT head and C spine were nonacute and labs reassuring. Covid swab was positive.  TRH consulted for admission for further evaluation and management Cova-19 viral infection.  Hospital course:  Covid-19 viral infection:  Patient presenting to ED with generalized weakness.  Covid-19 PCR positive. No respiratory symptoms or infiltrate on CXR. CRP is elevated to 6.5. While there is no pneumonia, I suspect this is the initial cause for weakness ultimately leading to fall and admission.  Patient was started on Paxlovid; he on discharge complete 5-day course.  Continue/isolation x 5 days.  Outpatient follow-up with PCP.   Generalized weakness: CT head/C-spine unrevealing.. CK normal.  Seen by PT and OT with recommendation of home health.   Metastatic prostate CA to bone:  Follows with medical oncology, Dr. Mosetta Putt outpatient.  Continue abiraterone and tamsulosin.  Continue prednisone 5 g p.o. daily.  Outpatient follow-up with oncology as scheduled on 12/02/2022.   HTN:  Continue losartan 100 mg p.o. daily, furosemide 20 mg p.o. daily.   Vitamin B12 deficiency:  Continue cyanocobalamin 1000 mcg p.o. daily.   Stage IIIa CKD: Suspected based on creatinine values.  Creatinine 0.61 at time of discharge.   Hypokalemia:  Repleted during hospitalization.  Discharge Diagnoses:  Principal Problem:   Generalized weakness Active Problems:   Prostate cancer metastatic to bone [facility])   Essential hypertension   Fall at home, initial encounter   COVID-19 virus infection   Dehydration   Acute prerenal azotemia    Discharge Instructions  Discharge Instructions     Call MD for:  difficulty breathing, headache or visual disturbances   Complete by: As directed    Call MD for:  extreme fatigue   Complete by: As directed    Call MD for:  persistant dizziness or light-headedness   Complete by: As directed    Call MD for:  persistant nausea and vomiting   Complete by: As directed    Call MD for:  severe uncontrolled pain   Complete by: As directed    Call MD for:  temperature >100.4   Complete by: As directed    Diet - low sodium heart healthy   Complete by: As directed    Increase activity slowly   Complete by: As directed       Allergies as of 11/04/2022       Reactions   Ibuprofen Other (See Comments)   GI bleed   Nebivolol Hcl Other (See Comments)   feels "blah"; low energy   Pravastatin Other (See Comments)   Lethargy   Sertraline Hcl Other (See Comments)   Terrible feeling   Simvastatin Other (See Comments)   Lethargy   Trazodone Other (See Comments)   Foggy headed        Medication List     STOP taking these  medications    oxyCODONE-acetaminophen 5-325 MG tablet Commonly known as: PERCOCET/ROXICET       TAKE these medications    abiraterone acetate 250 MG tablet Commonly known as: ZYTIGA Take 4 tablets (1,000 mg total) by mouth daily on an empty stomach 1 hour before meals OR 2 hours after a meal   acetaminophen 325 MG tablet Commonly known as: TYLENOL Take 1 tablet (325 mg total) by mouth every 6 (six) hours as needed for mild pain (or Fever >/= 101).   ASCORBIC ACID PO Take 1 tablet by mouth in the morning.  Vitamin C, unknown strength   bicalutamide 50 MG tablet Commonly known as: CASODEX Take 50 mg by mouth daily.   cyanocobalamin 1000 MCG tablet Take 1 tablet (1,000 mcg total) by mouth daily.   furosemide 20 MG tablet Commonly known as: LASIX Take 1 tablet (20 mg total) by mouth daily as needed for fluid or edema. What changed: when to take this   Linzess 145 MCG Caps capsule Generic drug: linaclotide Take 145 mcg by mouth daily as needed (constipation).   losartan 100 MG tablet Commonly known as: COZAAR Take 1 tablet (100 mg total) by mouth in the morning.   nirmatrelvir/ritonavir (renal dosing) 10 x 150 MG & 10 x 100MG  Tabs Commonly known as: PAXLOVID Take 2 tablets by mouth 2 (two) times daily for 5 days. Patient GFR is >60. Take nirmatrelvir (150 mg) one tablet twice daily for 5 days and ritonavir (100 mg) one tablet twice daily for 5 days.   oxyCODONE 5 MG immediate release tablet Commonly known as: Oxy IR/ROXICODONE Take 1-2 tablets (5-10 mg total) by mouth every 8 (eight) hours as needed for severe pain.   polyethylene glycol 17 g packet Commonly known as: MIRALAX / GLYCOLAX Take 17 g by mouth daily as needed. Hold for diarrhea. Take when taking percocet   predniSONE 5 MG tablet Commonly known as: DELTASONE Take 1 tablet (5 mg total) by mouth daily with breakfast.   SYSTANE OP Place 1 drop into both eyes 4 (four) times daily as needed (dry eyes).   tamsulosin 0.4 MG Caps capsule Commonly known as: FLOMAX Take 0.4 mg by mouth in the morning.   VITAMIN D PO Take 1 tablet by mouth daily.        Follow-up Information     Tally Joe, MD. Schedule an appointment as soon as possible for a visit in 1 week(s).   Specialty: Family Medicine Contact information: [phone] [place] [address] [phone]         Malachy Mood, MD. Go on 12/02/2022.   Specialties: Hematology, Oncology Contact information: [address]  [place] [address] [phone]                Allergies  Allergen Reactions   Ibuprofen Other (See Comments)    GI bleed   Nebivolol Hcl Other (See Comments)    feels "blah"; low energy   Pravastatin Other (See Comments)    Lethargy   Sertraline Hcl Other (See Comments)    Terrible feeling   Simvastatin Other (See Comments)    Lethargy   Trazodone Other (See Comments)    Foggy headed    Consultations: None   Procedures/Studies: CT HEAD WO CONTRAST  Result Date: 11/02/2022 CLINICAL DATA:  Neuro deficit, acute, stroke suspected; Neck trauma (Age >= 65y) EXAM: CT HEAD WITHOUT CONTRAST CT CERVICAL SPINE WITHOUT CONTRAST TECHNIQUE: Multidetector CT imaging of the head and  cervical spine was performed following the standard protocol without intravenous contrast. Multiplanar CT image reconstructions of the cervical spine were also generated. RADIATION DOSE REDUCTION: This exam was performed according to the departmental dose-optimization program which includes automated exposure control, adjustment of the mA and/or kV according to patient size and/or use of iterative reconstruction technique. COMPARISON:  None Available. FINDINGS: CT HEAD FINDINGS Brain: No evidence of acute infarction, hemorrhage, hydrocephalus, extra-axial collection or mass lesion/mass effect. Vascular: No hyperdense vessel or unexpected calcification. Skull: Normal. Negative for fracture or focal lesion. Sinuses/Orbits: Moderate paranasal sinus mucosal thickening. No acute orbital findings. Other: No mastoid effusions. CT CERVICAL SPINE FINDINGS Alignment: No substantial sagittal subluxation. Skull base and vertebrae: T1 sclerosis, which appears similar to prior CT March 15, 24. no evidence of acute fracture. Soft tissues and spinal canal: No prevertebral fluid or swelling. No visible canal hematoma. Disc levels:  Mild for age multilevel bony degenerative change. Upper chest: Visualized lung apices are clear.  IMPRESSION: 1. No evidence of acute intracranial abnormality. 2. No evidence of acute fracture or traumatic malalignment in the cervical spine. 3. Chronic sclerotic T1 metastasis. Electronically Signed   By: Feliberto Harts M.D.   On: 11/02/2022 20:17   CT CERVICAL SPINE WO CONTRAST  Result Date: 11/02/2022 CLINICAL DATA:  Neuro deficit, acute, stroke suspected; Neck trauma (Age >= 65y) EXAM: CT HEAD WITHOUT CONTRAST CT CERVICAL SPINE WITHOUT CONTRAST TECHNIQUE: Multidetector CT imaging of the head and cervical spine was performed following the standard protocol without intravenous contrast. Multiplanar CT image reconstructions of the cervical spine were also generated. RADIATION DOSE REDUCTION: This exam was performed according to the departmental dose-optimization program which includes automated exposure control, adjustment of the mA and/or kV according to patient size and/or use of iterative reconstruction technique. COMPARISON:  None Available. FINDINGS: CT HEAD FINDINGS Brain: No evidence of acute infarction, hemorrhage, hydrocephalus, extra-axial collection or mass lesion/mass effect. Vascular: No hyperdense vessel or unexpected calcification. Skull: Normal. Negative for fracture or focal lesion. Sinuses/Orbits: Moderate paranasal sinus mucosal thickening. No acute orbital findings. Other: No mastoid effusions. CT CERVICAL SPINE FINDINGS Alignment: No substantial sagittal subluxation. Skull base and vertebrae: T1 sclerosis, which appears similar to prior CT March 15, 24. no evidence of acute fracture. Soft tissues and spinal canal: No prevertebral fluid or swelling. No visible canal hematoma. Disc levels:  Mild for age multilevel bony degenerative change. Upper chest: Visualized lung apices are clear. IMPRESSION: 1. No evidence of acute intracranial abnormality. 2. No evidence of acute fracture or traumatic malalignment in the cervical spine. 3. Chronic sclerotic T1 metastasis. Electronically Signed    By: Feliberto Harts M.D.   On: 11/02/2022 20:17   DG Chest Portable 1 View  Result Date: 11/02/2022 CLINICAL DATA:  Weakness EXAM: PORTABLE CHEST 1 VIEW COMPARISON:  07/14/2022 FINDINGS: Lungs are clear. No pneumothorax or pleural effusion. Cardiac size appears mildly enlarged, though this is likely in central weighted by semi-erect positioning. Pulmonary vascularity is normal. No acute bone abnormality. IMPRESSION: 1. No active disease. Electronically Signed   By: Helyn Numbers M.D.   On: 11/02/2022 20:14     Subjective: Patient seen examined bedside, resting calmly.  Sitting in bedside chair.  No specific complaints.  Seen by therapy yesterday with recommendation of home health.  Not requiring oxygen.  Ready for discharge home.  Denies headache, no chest pain, no palpitations, no shortness of breath, no abdominal pain, no fever/chills/night sweats, no nausea cefonicid diarrhea, no focal weakness, no fatigue, no paresthesias.  No acute events overnight per nursing staff.  Discharge Exam: Vitals:   11/04/22 0610 11/04/22 1208  BP: (!) 153/73 (!) 143/96  Pulse: 71 70  Resp: 16 18  Temp: 98 F (36.7 C) 97.8 F (36.6 C)  SpO2: 96% 99%   Vitals:   11/03/22 2004 11/04/22 0500 11/04/22 0610 11/04/22 1208  BP: (!) 150/75  (!) 153/73 (!) 143/96  Pulse: 80  71 70  Resp: 16  16 18   Temp: 97.8 F (36.6 C)  98 F (36.7 C) 97.8 F (36.6 C)  TempSrc: Oral  Oral Oral  SpO2: 99%  96% 99%  Weight:  78.7 kg      Physical Exam: GEN: NAD, alert and oriented x 3, elderly/chronically ill in appearance HEENT: NCAT, PERRL, EOMI, sclera clear, MMM PULM: CTAB w/o wheezes/crackles, normal respiratory effort, on room air CV: RRR w/o M/G/R GI: abd soft, NTND, NABS MSK: no peripheral edema; moves all extremities independently NEURO: CN II-XII intact, no focal deficits, sensation to light touch intact PSYCH: normal mood/affect Integumentary: dry/intact, no rashes or wounds    The results of  significant diagnostics from this hospitalization (including imaging, microbiology, ancillary and laboratory) are listed below for reference.     Microbiology: Recent Results (from the past 240 hour(s))  Resp panel by RT-PCR (RSV, Flu A&B, Covid) Anterior Nasal Swab     Status: Abnormal   Collection Time: 11/02/22  8:03 PM   Specimen: Anterior Nasal Swab  Result Value Ref Range Status   SARS Coronavirus 2 by RT PCR POSITIVE (A) NEGATIVE Final    Comment: (NOTE) SARS-CoV-2 target nucleic acids are DETECTED.  The SARS-CoV-2 RNA is generally detectable in upper respiratory specimens during the acute phase of infection. Positive results are indicative of the presence of the identified virus, but do not rule out bacterial infection or co-infection with other pathogens not detected by the test. Clinical correlation with patient history and other diagnostic information is necessary to determine patient infection status. The expected result is Negative.  Fact Sheet for Patients: BloggerCourse.com  Fact Sheet for Healthcare Providers: SeriousBroker.it  This test is not yet approved or cleared by the [place] FDA and  has been authorized for detection and/or diagnosis of SARS-CoV-2 by FDA under an Emergency Use Authorization (EUA).  This EUA will remain in effect (meaning this test can be used) for the duration of  the COVID-19 declaration under Section 564(b)(1) of the A ct, 21 U.S.C. section 360bbb-3(b)(1), unless the authorization is terminated or revoked sooner.     Influenza A by PCR NEGATIVE NEGATIVE Final   Influenza B by PCR NEGATIVE NEGATIVE Final    Comment: (NOTE) The Xpert Xpress SARS-CoV-2/FLU/RSV plus assay is intended as an aid in the diagnosis of influenza from Nasopharyngeal swab specimens and should not be used as a sole basis for treatment. Nasal washings and aspirates are unacceptable for Xpert Xpress  SARS-CoV-2/FLU/RSV testing.  Fact Sheet for Patients: BloggerCourse.com  Fact Sheet for Healthcare Providers: SeriousBroker.it  This test is not yet approved or cleared by the [place] FDA and has been authorized for detection and/or diagnosis of SARS-CoV-2 by FDA under an Emergency Use Authorization (EUA). This EUA will remain in effect (meaning this test can be used) for the duration of the COVID-19 declaration under Section 564(b)(1) of the Act, 21 U.S.C. section 360bbb-3(b)(1), unless the authorization is terminated or revoked.     Resp Syncytial Virus by PCR NEGATIVE NEGATIVE Final    Comment: (NOTE) Fact Sheet  for Patients: BloggerCourse.com  Fact Sheet for Healthcare Providers: SeriousBroker.it  This test is not yet approved or cleared by the [place] FDA and has been authorized for detection and/or diagnosis of SARS-CoV-2 by FDA under an Emergency Use Authorization (EUA). This EUA will remain in effect (meaning this test can be used) for the duration of the COVID-19 declaration under Section 564(b)(1) of the Act, 21 U.S.C. section 360bbb-3(b)(1), unless the authorization is terminated or revoked.  Performed at [facility], [address], [place], [place] [address]      Labs: BNP (last 3 results) Recent Labs    07/14/22 1538  BNP 84.0   Basic Metabolic Panel: Recent Labs  Lab 11/02/22 2001 11/02/22 2003 11/02/22 2013 11/03/22 0557 11/04/22 0547  NA  --  136 139 135 134*  K  --  3.8 3.8 3.4* 3.4*  CL  --  107 105 108 107  CO2  --  23  --  20* 19*  GLUCOSE  --  98 94 93 104*  BUN  --  20 19 17 14   CREATININE  --  0.91 0.90 0.77 0.61  CALCIUM  --  8.3*  --  7.7* 7.4*  MG 2.3  --   --  2.0  --    Liver Function Tests: Recent Labs  Lab 11/02/22 2003 11/03/22 0557  AST 28 32  ALT 13 13  ALKPHOS 54 45  BILITOT 1.0 1.2   PROT 6.4* 5.6*  ALBUMIN 3.4* 2.8*   No results for input(s): "LIPASE", "AMYLASE" in the last 168 hours. No results for input(s): "AMMONIA" in the last 168 hours. CBC: Recent Labs  Lab 11/02/22 2003 11/02/22 2013 11/03/22 0557  WBC 8.0  --  5.5  NEUTROABS 4.7  --  2.5  HGB 12.3* 12.9* 11.5*  HCT 39.0 38.0* 36.2*  MCV 100.0  --  100.6*  PLT 132*  --  104*   Cardiac Enzymes: Recent Labs  Lab 11/02/22 2003  CKTOTAL 280   BNP: Invalid input(s): "POCBNP" CBG: Recent Labs  Lab 11/02/22 1902  GLUCAP 86   D-Dimer No results for input(s): "DDIMER" in the last 72 hours. Hgb A1c No results for input(s): "HGBA1C" in the last 72 hours. Lipid Profile No results for input(s): "CHOL", "HDL", "LDLCALC", "TRIG", "CHOLHDL", "LDLDIRECT" in the last 72 hours. Thyroid function studies Recent Labs    11/03/22 0557  TSH 1.150   Anemia work up Recent Labs    11/03/22 0557  VITAMINB12 207   Urinalysis    Component Value Date/Time   COLORURINE YELLOW 11/02/2022 2343   APPEARANCEUR HAZY (A) 11/02/2022 2343   LABSPEC 1.019 11/02/2022 2343   PHURINE 5.0 11/02/2022 2343   GLUCOSEU NEGATIVE 11/02/2022 2343   HGBUR NEGATIVE 11/02/2022 2343   BILIRUBINUR NEGATIVE 11/02/2022 2343   KETONESUR 5 (A) 11/02/2022 2343   PROTEINUR 30 (A) 11/02/2022 2343   NITRITE NEGATIVE 11/02/2022 2343   LEUKOCYTESUR NEGATIVE 11/02/2022 2343   Sepsis Labs Recent Labs  Lab 11/02/22 2003 11/03/22 0557  WBC 8.0 5.5   Microbiology Recent Results (from the past 240 hour(s))  Resp panel by RT-PCR (RSV, Flu A&B, Covid) Anterior Nasal Swab     Status: Abnormal   Collection Time: 11/02/22  8:03 PM   Specimen: Anterior Nasal Swab  Result Value Ref Range Status   SARS Coronavirus 2 by RT PCR POSITIVE (A) NEGATIVE Final    Comment: (NOTE) SARS-CoV-2 target nucleic acids are DETECTED.  The SARS-CoV-2 RNA is generally  detectable in upper respiratory specimens during the acute phase of infection.  Positive results are indicative of the presence of the identified virus, but do not rule out bacterial infection or co-infection with other pathogens not detected by the test. Clinical correlation with patient history and other diagnostic information is necessary to determine patient infection status. The expected result is Negative.  Fact Sheet for Patients: BloggerCourse.com  Fact Sheet for Healthcare Providers: SeriousBroker.it  This test is not yet approved or cleared by the [place] FDA and  has been authorized for detection and/or diagnosis of SARS-CoV-2 by FDA under an Emergency Use Authorization (EUA).  This EUA will remain in effect (meaning this test can be used) for the duration of  the COVID-19 declaration under Section 564(b)(1) of the A ct, 21 U.S.C. section 360bbb-3(b)(1), unless the authorization is terminated or revoked sooner.     Influenza A by PCR NEGATIVE NEGATIVE Final   Influenza B by PCR NEGATIVE NEGATIVE Final    Comment: (NOTE) The Xpert Xpress SARS-CoV-2/FLU/RSV plus assay is intended as an aid in the diagnosis of influenza from Nasopharyngeal swab specimens and should not be used as a sole basis for treatment. Nasal washings and aspirates are unacceptable for Xpert Xpress SARS-CoV-2/FLU/RSV testing.  Fact Sheet for Patients: BloggerCourse.com  Fact Sheet for Healthcare Providers: SeriousBroker.it  This test is not yet approved or cleared by the [place] FDA and has been authorized for detection and/or diagnosis of SARS-CoV-2 by FDA under an Emergency Use Authorization (EUA). This EUA will remain in effect (meaning this test can be used) for the duration of the COVID-19 declaration under Section 564(b)(1) of the Act, 21 U.S.C. section 360bbb-3(b)(1), unless the authorization is terminated or revoked.     Resp Syncytial Virus by PCR  NEGATIVE NEGATIVE Final    Comment: (NOTE) Fact Sheet for Patients: BloggerCourse.com  Fact Sheet for Healthcare Providers: SeriousBroker.it  This test is not yet approved or cleared by the [place] FDA and has been authorized for detection and/or diagnosis of SARS-CoV-2 by FDA under an Emergency Use Authorization (EUA). This EUA will remain in effect (meaning this test can be used) for the duration of the COVID-19 declaration under Section 564(b)(1) of the Act, 21 U.S.C. section 360bbb-3(b)(1), unless the authorization is terminated or revoked.  Performed at [facility], [address], [place], [place] [address]      Time coordinating discharge: Over 30 minutes  SIGNED:   Alvira Philips [place], DO  [facility] 11/04/2022, 1:19 PM

## 2022-11-04 NOTE — Progress Notes (Signed)
Patient provided with discharge education, patient verbalized understanding. IV removed.  

## 2022-11-04 NOTE — Telephone Encounter (Signed)
Notified by Scheduling Team that pt's daughter called stating the pt is currently in the hospital for Covid and is currently scheduled to come in on Friday, 11/06/2022.  Pt's daughter requested to have appts rescheduled to a later date since pt is currently still admitted to the hospital per scheduler's report to this nurse.  Scheduling Team requested approval to reschedule pt's appts.  This RN gave the approval to reschedule pt's appts on 11/06/2022 to a later date.  Notified Dr. Mosetta Putt of the pt's hospitalization and appt change.

## 2022-11-06 ENCOUNTER — Inpatient Hospital Stay: Payer: Medicare Other

## 2022-11-06 ENCOUNTER — Other Ambulatory Visit: Payer: Self-pay | Admitting: Hematology

## 2022-11-06 ENCOUNTER — Inpatient Hospital Stay: Payer: Medicare Other | Admitting: Hematology

## 2022-11-06 ENCOUNTER — Telehealth: Payer: Self-pay

## 2022-11-06 ENCOUNTER — Other Ambulatory Visit: Payer: Self-pay

## 2022-11-06 ENCOUNTER — Other Ambulatory Visit (HOSPITAL_COMMUNITY): Payer: Self-pay

## 2022-11-06 MED ORDER — OXYCODONE HCL 5 MG PO TABS: 5.0000 mg | ORAL_TABLET | Freq: Three times a day (TID) | ORAL | 0 refills | Status: DC | PRN

## 2022-11-06 NOTE — Telephone Encounter (Signed)
Patient called in stating he needs a refill of his Oxycodone and his prednisone. Please call into [facility].

## 2022-11-07 DIAGNOSIS — I129 Hypertensive chronic kidney disease with stage 1 through stage 4 chronic kidney disease, or unspecified chronic kidney disease: Secondary | ICD-10-CM | POA: Diagnosis not present

## 2022-11-07 DIAGNOSIS — E860 Dehydration: Secondary | ICD-10-CM | POA: Diagnosis not present

## 2022-11-07 DIAGNOSIS — U071 COVID-19: Secondary | ICD-10-CM | POA: Diagnosis not present

## 2022-11-07 DIAGNOSIS — E538 Deficiency of other specified B group vitamins: Secondary | ICD-10-CM | POA: Diagnosis not present

## 2022-11-07 DIAGNOSIS — C7951 Secondary malignant neoplasm of bone: Secondary | ICD-10-CM | POA: Diagnosis not present

## 2022-11-07 DIAGNOSIS — Z7952 Long term (current) use of systemic steroids: Secondary | ICD-10-CM | POA: Diagnosis not present

## 2022-11-07 DIAGNOSIS — N1831 Chronic kidney disease, stage 3a: Secondary | ICD-10-CM | POA: Diagnosis not present

## 2022-11-07 DIAGNOSIS — C61 Malignant neoplasm of prostate: Secondary | ICD-10-CM | POA: Diagnosis not present

## 2022-11-07 DIAGNOSIS — Z9181 History of falling: Secondary | ICD-10-CM | POA: Diagnosis not present

## 2022-11-11 DIAGNOSIS — U071 COVID-19: Secondary | ICD-10-CM | POA: Diagnosis not present

## 2022-11-11 DIAGNOSIS — M6281 Muscle weakness (generalized): Secondary | ICD-10-CM | POA: Diagnosis not present

## 2022-11-18 DIAGNOSIS — U071 COVID-19: Secondary | ICD-10-CM | POA: Diagnosis not present

## 2022-11-18 DIAGNOSIS — C7951 Secondary malignant neoplasm of bone: Secondary | ICD-10-CM | POA: Diagnosis not present

## 2022-11-18 DIAGNOSIS — C61 Malignant neoplasm of prostate: Secondary | ICD-10-CM | POA: Diagnosis not present

## 2022-11-18 DIAGNOSIS — Z7952 Long term (current) use of systemic steroids: Secondary | ICD-10-CM | POA: Diagnosis not present

## 2022-11-18 DIAGNOSIS — I129 Hypertensive chronic kidney disease with stage 1 through stage 4 chronic kidney disease, or unspecified chronic kidney disease: Secondary | ICD-10-CM | POA: Diagnosis not present

## 2022-11-18 DIAGNOSIS — E860 Dehydration: Secondary | ICD-10-CM | POA: Diagnosis not present

## 2022-11-18 DIAGNOSIS — E538 Deficiency of other specified B group vitamins: Secondary | ICD-10-CM | POA: Diagnosis not present

## 2022-11-18 DIAGNOSIS — Z9181 History of falling: Secondary | ICD-10-CM | POA: Diagnosis not present

## 2022-11-18 DIAGNOSIS — N1831 Chronic kidney disease, stage 3a: Secondary | ICD-10-CM | POA: Diagnosis not present

## 2022-11-19 ENCOUNTER — Encounter: Payer: Self-pay | Admitting: Hematology

## 2022-11-19 ENCOUNTER — Other Ambulatory Visit (HOSPITAL_COMMUNITY): Payer: Self-pay

## 2022-11-24 DIAGNOSIS — C61 Malignant neoplasm of prostate: Secondary | ICD-10-CM | POA: Diagnosis not present

## 2022-11-24 DIAGNOSIS — I129 Hypertensive chronic kidney disease with stage 1 through stage 4 chronic kidney disease, or unspecified chronic kidney disease: Secondary | ICD-10-CM | POA: Diagnosis not present

## 2022-11-24 DIAGNOSIS — Z9181 History of falling: Secondary | ICD-10-CM | POA: Diagnosis not present

## 2022-11-24 DIAGNOSIS — U071 COVID-19: Secondary | ICD-10-CM | POA: Diagnosis not present

## 2022-11-24 DIAGNOSIS — N1831 Chronic kidney disease, stage 3a: Secondary | ICD-10-CM | POA: Diagnosis not present

## 2022-11-24 DIAGNOSIS — Z7952 Long term (current) use of systemic steroids: Secondary | ICD-10-CM | POA: Diagnosis not present

## 2022-11-24 DIAGNOSIS — E538 Deficiency of other specified B group vitamins: Secondary | ICD-10-CM | POA: Diagnosis not present

## 2022-11-24 DIAGNOSIS — E860 Dehydration: Secondary | ICD-10-CM | POA: Diagnosis not present

## 2022-11-24 DIAGNOSIS — C7951 Secondary malignant neoplasm of bone: Secondary | ICD-10-CM | POA: Diagnosis not present

## 2022-11-27 ENCOUNTER — Other Ambulatory Visit: Payer: Self-pay

## 2022-11-30 ENCOUNTER — Other Ambulatory Visit (HOSPITAL_COMMUNITY): Payer: Self-pay

## 2022-12-01 ENCOUNTER — Other Ambulatory Visit (HOSPITAL_COMMUNITY): Payer: Self-pay

## 2022-12-01 ENCOUNTER — Other Ambulatory Visit: Payer: Self-pay

## 2022-12-01 DIAGNOSIS — Z9181 History of falling: Secondary | ICD-10-CM | POA: Diagnosis not present

## 2022-12-01 DIAGNOSIS — N1831 Chronic kidney disease, stage 3a: Secondary | ICD-10-CM | POA: Diagnosis not present

## 2022-12-01 DIAGNOSIS — I129 Hypertensive chronic kidney disease with stage 1 through stage 4 chronic kidney disease, or unspecified chronic kidney disease: Secondary | ICD-10-CM | POA: Diagnosis not present

## 2022-12-01 DIAGNOSIS — Z7952 Long term (current) use of systemic steroids: Secondary | ICD-10-CM | POA: Diagnosis not present

## 2022-12-01 DIAGNOSIS — C7951 Secondary malignant neoplasm of bone: Secondary | ICD-10-CM | POA: Diagnosis not present

## 2022-12-01 DIAGNOSIS — E860 Dehydration: Secondary | ICD-10-CM | POA: Diagnosis not present

## 2022-12-01 DIAGNOSIS — E538 Deficiency of other specified B group vitamins: Secondary | ICD-10-CM | POA: Diagnosis not present

## 2022-12-01 DIAGNOSIS — U071 COVID-19: Secondary | ICD-10-CM | POA: Diagnosis not present

## 2022-12-01 DIAGNOSIS — C61 Malignant neoplasm of prostate: Secondary | ICD-10-CM | POA: Diagnosis not present

## 2022-12-01 NOTE — Progress Notes (Unsigned)
[facility]   [phone] [phone]   Clinic Follow up Note   Patient Care Team: Tally Joe, MD as PCP - General (Family Medicine) Othella Boyer, MD as Consulting Physician (Cardiology) Tally Joe, MD as Attending Physician (Family Medicine) Cherlyn Cushing, RN as Oncology Nurse Navigator Malachy Mood, MD as Consulting Physician (Hematology and Oncology)  Date of Service:  12/02/2022  CHIEF COMPLAINT: f/u of Prostate Cancer metastatic to bone     CURRENT THERAPY:  Eigard Injection  q30months Zytiga 1000 mg daily Prednisone 5 mg daily  ASSESSMENT:  Alexander Rich is a 89 y.o. 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  I explained the logistics to him in detail, however patient is strongly against radiation therapy, he does not want to prolong his life, his goal of therapy is to improve his quality of life.  Also I think radiation will improve his pain control, he still declined RT -His leg edema has improved since he started ADT however his pain is still significant, I switched Casodex to Zytiga and prednisone, for better disease control and symptom relief. He started in June 2024. He responded well, both pain and leg edema has improved.  -Due to his advanced age, limited social support, if he progressed on ADT, I would recommend hospice care.  He is not a candidate for chemotherapy -will also refer him to palliative care   B12 deficiency -His B12 level was slightly low in June 2024, I started him injection.  He also started oral B12 supplement. -Proceed to B12 injection today,  then spaced out to every 2 to 4 months based on his level  Metastasis to bone -I recommend Xgeva ejection, he will start today.  He was started over-the-counter calcium and vitamin D supplement. -Denies any active dental issues. -Will continue Xgeva, next injection in 6 weeks, then every 4 months with Eligard    PLAN: -lab -reviewed - I refill prednisone - I refill Zytiga -pt is taking OTC B12 -B12 injection today - I recommend Xgeva from bone strengthen -recommend pt to take calcium supplemnet -lab and f/u in 6 weeks  SUMMARY OF ONCOLOGIC HISTORY: Oncology History Overview Note   Cancer Staging  Prostate cancer metastatic to bone [facility]) Staging form: Prostate, AJCC 8th Edition - Clinical: Stage IVB (cT3, cN1, pM1c, PSA: 587) - Signed by Josph Macho, MD on 07/17/2022 Prostate specific antigen (PSA) range: 20 or greater     Prostate cancer metastatic to bone (HCC)  07/17/2022 Initial Diagnosis   Prostate cancer metastatic to bone (HCC)   07/17/2022 Cancer Staging   Staging form: Prostate, AJCC 8th Edition - Clinical: Stage IVB (cT3, cN1, pM1c, PSA: 587) - Signed by Josph Macho, MD on 07/17/2022 Prostate specific antigen (PSA) range: 20 or greater   07/17/2022 Imaging    IMPRESSION: 1. Signs of prostate cancer with nodal and bone metastasis. 2. L3 metastatic lesion with extensive soft tissue extending into a narrowed central canal. Correlate with any acute symptoms of cord compression and consider MRI for further assessment as warranted. Superior endplate deformity at L3 likely a combination of  Schmorl's node and pathologic fracture. Potential for early pathologic fracture at T1 as well. 3. Soft tissue along neurovascular structures in the LEFT pelvis raising the question of sciatic nerve involvement from tumor extension in this area this patient with LEFT-sided hip pain. 4. Bulky nodal disease in the pelvis likely explains lower extremity edema. 5. Bladder wall  thickening which is largely circumferential likely related to bladder outlet obstruction with potential small bladder calculus. 6. Eccentric thickening of the sigmoid in the setting of diverticular changes is nonspecific. Correlation with Cologuard testing could be considered to determine whether further evaluation may be warranted.      INTERVAL HISTORY:  Alexander Rich is here for a follow up of Prostate Cancer metastatic to bone  . He was last seen by me on 10/09/2022 He presents to the clinic alone. Pt state that he has no pain or swelling in his  ankles and feet. Pt state that he take 1 oxycodone a day. Pt report he doesn't do a lot but try to do something. Pt state he is able to go out and shop and ride is mower.      All other systems were reviewed with the patient and are negative.  MEDICAL HISTORY:  Past Medical History:  Diagnosis Date   Constipation    Hip pain    Hyperpigmented skin lesions 03/21/2022   Hypertension    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    SURGICAL HISTORY: Past Surgical History:  Procedure Laterality Date   BACK SURGERY     ESOPHAGOGASTRODUODENOSCOPY N/A 03/24/2022   Procedure: ESOPHAGOGASTRODUODENOSCOPY (EGD);  Surgeon: Willis Modena, MD;  Location: [facility];  Service: Gastroenterology;  Laterality: N/A;    I have reviewed the social history and family history with the patient and they are unchanged from previous note.  ALLERGIES:  is allergic to ibuprofen, nebivolol hcl, pravastatin, sertraline hcl, simvastatin, and trazodone.  MEDICATIONS:  Current Outpatient Medications  Medication Sig Dispense Refill   abiraterone acetate (ZYTIGA) 250 MG tablet Take 4 tablets (1,000 mg total) by mouth daily on an empty stomach 1 hour before meals OR 2 hours after a meal 120 tablet 2   acetaminophen (TYLENOL) 325 MG tablet Take 1 tablet (325 mg total) by mouth every 6 (six) hours as needed for mild pain (or Fever >/= 101).      ASCORBIC ACID PO Take 1 tablet by mouth in the morning. Vitamin C, unknown strength     bicalutamide (CASODEX) 50 MG tablet Take 50 mg by mouth daily.     cyanocobalamin 1000 MCG tablet Take 1 tablet (1,000 mcg total) by mouth daily.     furosemide (LASIX) 20 MG tablet Take 1 tablet (20 mg total) by mouth daily as needed for fluid or edema. (Patient taking differently: Take 20 mg by mouth daily.)     linaclotide (LINZESS) 145 MCG CAPS capsule Take 145 mcg by mouth daily as needed (constipation).     losartan (COZAAR) 100 MG tablet Take 1 tablet (100 mg total) by mouth in the morning.     oxyCODONE (OXY IR/ROXICODONE) 5 MG immediate release tablet Take 1-2 tablets (5-10 mg total) by mouth every 6 (six) hours as needed for severe pain. 60 tablet 0   Polyethyl Glycol-Propyl Glycol (SYSTANE OP) Place 1 drop into both eyes 4 (four) times daily as needed (dry eyes).     polyethylene glycol (MIRALAX / GLYCOLAX) 17 g packet Take 17 g by mouth daily as needed. Hold for diarrhea.  Take when taking percocet 30 each 0   predniSONE (DELTASONE) 5 MG tablet Take 1 tablet (5 mg total) by mouth daily with breakfast. 90 tablet 1   tamsulosin (FLOMAX) 0.4 MG CAPS capsule Take 0.4 mg by mouth in the morning.     VITAMIN D PO Take 1 tablet by mouth daily.     No current facility-administered medications for this visit.    PHYSICAL EXAMINATION: ECOG PERFORMANCE STATUS: 2 - Symptomatic, <50% confined to bed  Vitals:   12/02/22 1125  BP: (!) 153/70  Pulse: 89  Resp: 17  Temp: 98.2 F (36.8 C)  SpO2: 100%   Wt Readings from Last 3 Encounters:  12/02/22 176 lb 3.2 oz (79.9 kg)  11/04/22 173 lb 6.4 oz (78.7 kg)  09/02/22 166 lb 14.4 oz (75.7 kg)     GENERAL:alert, no distress and comfortable SKIN: skin color, texture, turgor are normal, no rashes or significant lesions EYES: normal, Conjunctiva are pink and non-injected, sclera clear NECK: supple, thyroid normal size, non-tender, without nodularity LYMPH:   no palpable lymphadenopathy in the cervical, axillary  LUNGS: clear to auscultation and percussion with normal breathing effort HEART: regular rate & rhythm and no murmurs and no lower extremity edema ABDOMEN:abdomen soft, non-tender and normal bowel sounds Musculoskeletal:no cyanosis of digits and no clubbing  NEURO: alert & oriented x 3 with fluent speech, no focal motor/sensory deficits  LABORATORY DATA:  I have reviewed the data as listed    Latest Ref Rng & Units 12/02/2022   10:56 AM 11/03/2022    5:57 AM 11/02/2022    8:13 PM  CBC  WBC 4.0 - 10.5 K/uL 5.8  5.5    Hemoglobin 13.0 - 17.0 g/dL 16.1  09.6  04.5   Hematocrit 39.0 - 52.0 % 34.5  36.2  38.0   Platelets 150 - 400 K/uL 136  104          Latest Ref Rng & Units 12/02/2022   10:56 AM 11/04/2022    5:47 AM 11/03/2022    5:57 AM  CMP  Glucose 70 - 99 mg/dL 409  811  93   BUN 8 - 23 mg/dL 18  14  17    Creatinine 0.61 - 1.24 mg/dL 9.14  7.82  9.56   Sodium 135 - 145 mmol/L 139  134  135   Potassium 3.5 - 5.1 mmol/L 3.8  3.4  3.4   Chloride 98 - 111 mmol/L 111  107  108   CO2 22 - 32 mmol/L 23  19  20    Calcium 8.9 - 10.3 mg/dL 8.4  7.4  7.7   Total Protein 6.5 - 8.1 g/dL 5.8   5.6   Total Bilirubin 0.3 - 1.2 mg/dL 1.0   1.2   Alkaline Phos 38 - 126 U/L 43   45   AST 15 - 41 U/L 17   32   ALT 0 - 44 U/L 7   13       RADIOGRAPHIC STUDIES: I have personally reviewed the radiological images as listed and agreed with the findings in the report. No results found.    Orders Placed This Encounter  Procedures   Prostate-Specific AG, Serum    Standing Status:   Standing    Number of Occurrences:   20    Standing Expiration Date:   12/02/2023   All questions were answered. The patient knows to call the clinic with any problems, questions or concerns. No barriers to learning was  detected. The total time spent in the appointment was 30 minutes.     Malachy Mood, MD 12/02/2022   Carolin Coy, CMA, am acting as scribe  for Malachy Mood, MD.   I have reviewed the above documentation for accuracy and completeness, and I agree with the above.

## 2022-12-02 ENCOUNTER — Encounter: Payer: Self-pay | Admitting: Hematology

## 2022-12-02 ENCOUNTER — Inpatient Hospital Stay: Payer: Medicare Other

## 2022-12-02 ENCOUNTER — Inpatient Hospital Stay: Payer: Medicare Other | Admitting: Hematology

## 2022-12-02 ENCOUNTER — Inpatient Hospital Stay: Payer: Medicare Other | Attending: Hematology

## 2022-12-02 ENCOUNTER — Other Ambulatory Visit: Payer: Self-pay

## 2022-12-02 VITALS — BP 153/70 | HR 89 | Temp 98.2°F | Resp 17 | Wt 176.2 lb

## 2022-12-02 DIAGNOSIS — C61 Malignant neoplasm of prostate: Secondary | ICD-10-CM

## 2022-12-02 DIAGNOSIS — E538 Deficiency of other specified B group vitamins: Secondary | ICD-10-CM

## 2022-12-02 DIAGNOSIS — C7951 Secondary malignant neoplasm of bone: Secondary | ICD-10-CM | POA: Diagnosis not present

## 2022-12-02 DIAGNOSIS — Z5111 Encounter for antineoplastic chemotherapy: Secondary | ICD-10-CM | POA: Insufficient documentation

## 2022-12-02 HISTORY — DX: Deficiency of other specified B group vitamins: E53.8

## 2022-12-02 LAB — CBC WITH DIFFERENTIAL (CANCER CENTER ONLY)
Abs Immature Granulocytes: 0.01 10*3/uL (ref 0.00–0.07)
Basophils Absolute: 0 10*3/uL (ref 0.0–0.1)
Basophils Relative: 0 %
Eosinophils Absolute: 0.1 10*3/uL (ref 0.0–0.5)
Eosinophils Relative: 1 %
HCT: 34.5 % — ABNORMAL LOW (ref 39.0–52.0)
Hemoglobin: 11.5 g/dL — ABNORMAL LOW (ref 13.0–17.0)
Immature Granulocytes: 0 %
Lymphocytes Relative: 34 %
Lymphs Abs: 2 10*3/uL (ref 0.7–4.0)
MCH: 32.5 pg (ref 26.0–34.0)
MCHC: 33.3 g/dL (ref 30.0–36.0)
MCV: 97.5 fL (ref 80.0–100.0)
Monocytes Absolute: 0.6 10*3/uL (ref 0.1–1.0)
Monocytes Relative: 11 %
Neutro Abs: 3.1 10*3/uL (ref 1.7–7.7)
Neutrophils Relative %: 54 %
Platelet Count: 136 10*3/uL — ABNORMAL LOW (ref 150–400)
RBC: 3.54 MIL/uL — ABNORMAL LOW (ref 4.22–5.81)
RDW: 13.7 % (ref 11.5–15.5)
WBC Count: 5.8 10*3/uL (ref 4.0–10.5)
nRBC: 0 % (ref 0.0–0.2)

## 2022-12-02 LAB — CMP (CANCER CENTER ONLY)
ALT: 7 U/L (ref 0–44)
AST: 17 U/L (ref 15–41)
Albumin: 3.3 g/dL — ABNORMAL LOW (ref 3.5–5.0)
Alkaline Phosphatase: 43 U/L (ref 38–126)
Anion gap: 5 (ref 5–15)
BUN: 18 mg/dL (ref 8–23)
CO2: 23 mmol/L (ref 22–32)
Calcium: 8.4 mg/dL — ABNORMAL LOW (ref 8.9–10.3)
Chloride: 111 mmol/L (ref 98–111)
Creatinine: 0.84 mg/dL (ref 0.61–1.24)
GFR, Estimated: >60 mL/min (ref >60)
Glucose, Bld: 127 mg/dL — ABNORMAL HIGH (ref 70–99)
Potassium: 3.8 mmol/L (ref 3.5–5.1)
Sodium: 139 mmol/L (ref 135–145)
Total Bilirubin: 1 mg/dL (ref 0.3–1.2)
Total Protein: 5.8 g/dL — ABNORMAL LOW (ref 6.5–8.1)

## 2022-12-02 MED ORDER — DENOSUMAB 120 MG/1.7ML ~~LOC~~ SOLN
120.0000 mg | Freq: Once | SUBCUTANEOUS | Status: AC
  Administered 2022-12-02: 120 mg via SUBCUTANEOUS
  Filled 2022-12-02: qty 1.7

## 2022-12-02 MED ORDER — CYANOCOBALAMIN 1000 MCG/ML IJ SOLN
1000.0000 ug | Freq: Once | INTRAMUSCULAR | Status: AC
  Administered 2022-12-02: 1000 ug via INTRAMUSCULAR
  Filled 2022-12-02: qty 1

## 2022-12-02 MED ORDER — OXYCODONE HCL 5 MG PO TABS: 5.0000 mg | ORAL_TABLET | Freq: Four times a day (QID) | ORAL | 0 refills | Status: DC | PRN

## 2022-12-02 MED ORDER — PREDNISONE 5 MG PO TABS: 5.0000 mg | ORAL_TABLET | Freq: Every day | ORAL | 1 refills | Status: DC

## 2022-12-02 NOTE — Assessment & Plan Note (Addendum)
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  I explained the logistics to him in detail, however patient is strongly against radiation therapy, he does not want to prolong his life, his goal of therapy is to improve his quality of life.  Also I think radiation will improve his pain control, he still declined RT --His leg edema has improved since he started ADT however his pain is still significant, I switched Casodex to Zytiga and prednisone, for better disease control and symptom relief. He started in June 2024. He responded well, both pain and leg edema has improved.  -Due to his advanced age, limited social support, if he progressed on ADT, I would recommend hospice care.  He is not a candidate for chemotherapy -will also refer him to palliative care

## 2022-12-02 NOTE — Patient Instructions (Signed)
Denosumab Injection (Oncology) What is this medication? DENOSUMAB (den oh SUE mab) prevents weakened bones caused by cancer. It may also be used to treat noncancerous bone tumors that cannot be removed by surgery. It can also be used to treat high calcium levels in the blood caused by cancer. It works by blocking a protein that causes bones to break down quickly. This slows down the release of calcium from bones, which lowers calcium levels in your blood. It also makes your bones stronger and less likely to break (fracture). This medicine may be used for other purposes; ask your health care provider or pharmacist if you have questions. COMMON BRAND NAME(S): XGEVA What should I tell my care team before I take this medication? They need to know if you have any of these conditions: Dental disease Having surgery or tooth extraction Infection Kidney disease Low levels of calcium or vitamin D in the blood Malnutrition On hemodialysis Skin conditions or sensitivity Thyroid or parathyroid disease An unusual reaction to denosumab, other medications, foods, dyes, or preservatives Pregnant or trying to get pregnant Breast-feeding How should I use this medication? This medication is for injection under the skin. It is given by your care team in a hospital or clinic setting. A special MedGuide will be given to you before each treatment. Be sure to read this information carefully each time. Talk to your care team about the use of this medication in children. While it may be prescribed for children as young as 13 years for selected conditions, precautions do apply. Overdosage: If you think you have taken too much of this medicine contact a poison control center or emergency room at once. NOTE: This medicine is only for you. Do not share this medicine with others. What if I miss a dose? Keep appointments for follow-up doses. It is important not to miss your dose. Call your care team if you are unable to  keep an appointment. What may interact with this medication? Do not take this medication with any of the following: Other medications containing denosumab This medication may also interact with the following: Medications that lower your chance of fighting infection Steroid medications, such as prednisone or cortisone This list may not describe all possible interactions. Give your health care provider a list of all the medicines, herbs, non-prescription drugs, or dietary supplements you use. Also tell them if you smoke, drink alcohol, or use illegal drugs. Some items may interact with your medicine. What should I watch for while using this medication? Your condition will be monitored carefully while you are receiving this medication. You may need blood work while taking this medication. This medication may increase your risk of getting an infection. Call your care team for advice if you get a fever, chills, sore throat, or other symptoms of a cold or flu. Do not treat yourself. Try to avoid being around people who are sick. You should make sure you get enough calcium and vitamin D while you are taking this medication, unless your care team tells you not to. Discuss the foods you eat and the vitamins you take with your care team. Some people who take this medication have severe bone, joint, or muscle pain. This medication may also increase your risk for jaw problems or a broken thigh bone. Tell your care team right away if you have severe pain in your jaw, bones, joints, or muscles. Tell your care team if you have any pain that does not go away or that gets worse. Talk  to your care team if you may be pregnant. Serious birth defects can occur if you take this medication during pregnancy and for 5 months after the last dose. You will need a negative pregnancy test before starting this medication. Contraception is recommended while taking this medication and for 5 months after the last dose. Your care team  can help you find the option that works for you. What side effects may I notice from receiving this medication? Side effects that you should report to your care team as soon as possible: Allergic reactions--skin rash, itching, hives, swelling of the face, lips, tongue, or throat Bone, joint, or muscle pain Low calcium level--muscle pain or cramps, confusion, tingling, or numbness in the hands or feet Osteonecrosis of the jaw--pain, swelling, or redness in the mouth, numbness of the jaw, poor healing after dental work, unusual discharge from the mouth, visible bones in the mouth Side effects that usually do not require medical attention (report to your care team if they continue or are bothersome): Cough Diarrhea Fatigue Headache Nausea This list may not describe all possible side effects. Call your doctor for medical advice about side effects. You may report side effects to FDA at 1-800-FDA-1088. Where should I keep my medication? This medication is given in a hospital or clinic. It will not be stored at home. NOTE: This sheet is a summary. It may not cover all possible information. If you have questions about this medicine, talk to your doctor, pharmacist, or health care provider.  2024 Elsevier/Gold Standard (2021-09-10 00:00:00) Vitamin B12 Capsules or Tablets What is this medication? VITAMIN B12 (VAHY tuh min B12) prevents and treats low vitamin B12 levels in your body. It is used in people who do not get enough vitamin B12 from their diet or when their digestive tract does not absorb enough. Vitamin B12 plays an important role in maintaining the health of your nervous system and red blood cells. This medicine may be used for other purposes; ask your health care provider or pharmacist if you have questions. What should I tell my care team before I take this medication? They need to know if you have any of these conditions: Anemia Kidney disease Leber's disease Malabsorption  disorder An unusual or allergic reaction to cyanocobalamin, cobalt, other medications, foods, dyes, or preservatives Pregnant or trying to get pregnant Breast-feeding How should I use this medication? Take this medication by mouth with a glass of water. Follow the directions on the package or prescription label. If you are taking the tablets, do not chew, cut, or crush this medication. If using a vitamin solution, use a specially marked spoon or dropper to measure each dose. Ask your pharmacist if you do not have one. Household spoons are not accurate. For best results take this vitamin with food. Take your medication at regular intervals. Do not take your medication more often than directed. Talk to your care team about the use of this medication in children. While this medication may be prescribed for selected conditions, precautions do apply. Overdosage: If you think you have taken too much of this medicine contact a poison control center or emergency room at once. NOTE: This medicine is only for you. Do not share this medicine with others. What if I miss a dose? If you miss a dose, take it as soon as you can. If it is almost time for your next dose, take only that dose. Do not take double or extra doses. What may interact with this medication? Alcohol  Aminosalicylic acid Colchicine Medications that suppress your bone marrow, such as chemotherapy, chloramphenicol This list may not describe all possible interactions. Give your health care provider a list of all the medicines, herbs, non-prescription drugs, or dietary supplements you use. Also tell them if you smoke, drink alcohol, or use illegal drugs. Some items may interact with your medicine. What should I watch for while using this medication? Follow a healthy diet. Taking a vitamin supplement does not replace the need for a balanced diet. Some foods that have vitamin B12 naturally are fish, seafood, egg yolk, milk, and fermented cheese. Too  much of this vitamin can be unsafe. Talk to your care team about how much is right for you. What side effects may I notice from receiving this medication? Side effects that you should report to your care team as soon as possible: Allergic reactions--skin rash, itching, hives, swelling of the face, lips, tongue, or throat Side effects that usually do not require medical attention (report to your care team if they continue or are bothersome): Diarrhea Fatigue Headache Nausea This list may not describe all possible side effects. Call your doctor for medical advice about side effects. You may report side effects to FDA at 1-800-FDA-1088. Where should I keep my medication? Keep out of the reach of children and pets. Store at room temperature between 15 and 30 degrees C (59 and 85 degrees F). Protect from heat and light. Throw away any unused medication after the expiration date. NOTE: This sheet is a summary. It may not cover all possible information. If you have questions about this medicine, talk to your doctor, pharmacist, or health care provider.  2024 Elsevier/Gold Standard (2020-12-31 00:00:00)

## 2022-12-07 DIAGNOSIS — E538 Deficiency of other specified B group vitamins: Secondary | ICD-10-CM | POA: Diagnosis not present

## 2022-12-07 DIAGNOSIS — I129 Hypertensive chronic kidney disease with stage 1 through stage 4 chronic kidney disease, or unspecified chronic kidney disease: Secondary | ICD-10-CM | POA: Diagnosis not present

## 2022-12-07 DIAGNOSIS — E860 Dehydration: Secondary | ICD-10-CM | POA: Diagnosis not present

## 2022-12-07 DIAGNOSIS — C61 Malignant neoplasm of prostate: Secondary | ICD-10-CM | POA: Diagnosis not present

## 2022-12-07 DIAGNOSIS — U071 COVID-19: Secondary | ICD-10-CM | POA: Diagnosis not present

## 2022-12-07 DIAGNOSIS — Z9181 History of falling: Secondary | ICD-10-CM | POA: Diagnosis not present

## 2022-12-07 DIAGNOSIS — C7951 Secondary malignant neoplasm of bone: Secondary | ICD-10-CM | POA: Diagnosis not present

## 2022-12-07 DIAGNOSIS — Z7952 Long term (current) use of systemic steroids: Secondary | ICD-10-CM | POA: Diagnosis not present

## 2022-12-07 DIAGNOSIS — N1831 Chronic kidney disease, stage 3a: Secondary | ICD-10-CM | POA: Diagnosis not present

## 2022-12-08 ENCOUNTER — Other Ambulatory Visit: Payer: Self-pay

## 2022-12-14 DIAGNOSIS — Z9181 History of falling: Secondary | ICD-10-CM | POA: Diagnosis not present

## 2022-12-14 DIAGNOSIS — E538 Deficiency of other specified B group vitamins: Secondary | ICD-10-CM | POA: Diagnosis not present

## 2022-12-14 DIAGNOSIS — E860 Dehydration: Secondary | ICD-10-CM | POA: Diagnosis not present

## 2022-12-14 DIAGNOSIS — C61 Malignant neoplasm of prostate: Secondary | ICD-10-CM | POA: Diagnosis not present

## 2022-12-14 DIAGNOSIS — Z7952 Long term (current) use of systemic steroids: Secondary | ICD-10-CM | POA: Diagnosis not present

## 2022-12-14 DIAGNOSIS — I129 Hypertensive chronic kidney disease with stage 1 through stage 4 chronic kidney disease, or unspecified chronic kidney disease: Secondary | ICD-10-CM | POA: Diagnosis not present

## 2022-12-14 DIAGNOSIS — C7951 Secondary malignant neoplasm of bone: Secondary | ICD-10-CM | POA: Diagnosis not present

## 2022-12-14 DIAGNOSIS — N1831 Chronic kidney disease, stage 3a: Secondary | ICD-10-CM | POA: Diagnosis not present

## 2022-12-14 DIAGNOSIS — U071 COVID-19: Secondary | ICD-10-CM | POA: Diagnosis not present

## 2022-12-21 ENCOUNTER — Encounter: Payer: Self-pay | Admitting: Hematology

## 2022-12-23 ENCOUNTER — Encounter: Payer: Self-pay | Admitting: Hematology

## 2022-12-23 ENCOUNTER — Other Ambulatory Visit: Payer: Self-pay

## 2022-12-25 ENCOUNTER — Other Ambulatory Visit (HOSPITAL_COMMUNITY): Payer: Self-pay

## 2022-12-30 ENCOUNTER — Ambulatory Visit: Payer: Medicare Other | Admitting: Podiatry

## 2022-12-30 DIAGNOSIS — M79674 Pain in right toe(s): Secondary | ICD-10-CM

## 2022-12-30 DIAGNOSIS — M79675 Pain in left toe(s): Secondary | ICD-10-CM | POA: Diagnosis not present

## 2022-12-30 DIAGNOSIS — B351 Tinea unguium: Secondary | ICD-10-CM | POA: Diagnosis not present

## 2022-12-30 NOTE — Progress Notes (Signed)
Subjective:   Patient ID: Alexander Rich, male   DOB: 89 y.o.   MRN: [id number]   HPI Patient presents with thick dystrophic nails that he cannot cut 1-5 both feet that become painful at times for him.  Patient does not smoke likes to be active as best as possible   Review of Systems  All other systems reviewed and are negative.       Objective:  Physical Exam Vitals and nursing note reviewed.  Constitutional:      Appearance: He is well-developed.  Pulmonary:     Effort: Pulmonary effort is normal.  Musculoskeletal:        General: Normal range of motion.  Skin:    General: Skin is warm.  Neurological:     Mental Status: He is alert.     Neurovascular status intact with patient found to have thickened yellow brittle nailbeds 1-5 both feet that are dystrophic and they are painful when pressed     Assessment:  Mycotic nail infection with pain 1-5 both feet     Plan:  H&P done debrided nailbeds 1-5 both feet nitrogen and bleeding reappoint routine care

## 2023-01-01 ENCOUNTER — Other Ambulatory Visit: Payer: Self-pay | Admitting: Hematology

## 2023-01-01 ENCOUNTER — Other Ambulatory Visit (HOSPITAL_COMMUNITY): Payer: Self-pay

## 2023-01-01 ENCOUNTER — Telehealth: Payer: Self-pay

## 2023-01-01 MED ORDER — OXYCODONE HCL 5 MG PO TABS: 5.0000 mg | ORAL_TABLET | Freq: Four times a day (QID) | ORAL | 0 refills | Status: DC | PRN

## 2023-01-01 NOTE — Telephone Encounter (Signed)
Pt called requesting a refill for Oxycodone IR 5mg .  Informed pt that Dr. Mosetta Putt is out of the office today but this nurse will make Dr. Mosetta Putt aware of the pt's request.  Pt verbalized understanding and had no further questions or concerns.

## 2023-01-05 ENCOUNTER — Other Ambulatory Visit: Payer: Self-pay

## 2023-01-06 ENCOUNTER — Other Ambulatory Visit (HOSPITAL_COMMUNITY): Payer: Self-pay

## 2023-01-13 ENCOUNTER — Inpatient Hospital Stay: Payer: Medicare Other | Attending: Hematology

## 2023-01-13 ENCOUNTER — Inpatient Hospital Stay: Payer: Medicare Other

## 2023-01-13 ENCOUNTER — Inpatient Hospital Stay: Payer: Medicare Other | Admitting: Hematology

## 2023-01-13 ENCOUNTER — Other Ambulatory Visit: Payer: Self-pay

## 2023-01-13 VITALS — BP 138/81 | HR 89 | Temp 97.6°F | Resp 16 | Wt 168.0 lb

## 2023-01-13 DIAGNOSIS — C7951 Secondary malignant neoplasm of bone: Secondary | ICD-10-CM | POA: Diagnosis not present

## 2023-01-13 DIAGNOSIS — C61 Malignant neoplasm of prostate: Secondary | ICD-10-CM

## 2023-01-13 DIAGNOSIS — C775 Secondary and unspecified malignant neoplasm of intrapelvic lymph nodes: Secondary | ICD-10-CM | POA: Insufficient documentation

## 2023-01-13 DIAGNOSIS — Z5111 Encounter for antineoplastic chemotherapy: Secondary | ICD-10-CM | POA: Diagnosis not present

## 2023-01-13 DIAGNOSIS — E538 Deficiency of other specified B group vitamins: Secondary | ICD-10-CM

## 2023-01-13 LAB — CBC WITH DIFFERENTIAL (CANCER CENTER ONLY)
Abs Immature Granulocytes: 0.01 10*3/uL (ref 0.00–0.07)
Basophils Absolute: 0 10*3/uL (ref 0.0–0.1)
Basophils Relative: 0 %
Eosinophils Absolute: 0 10*3/uL (ref 0.0–0.5)
Eosinophils Relative: 0 %
HCT: 39.1 % (ref 39.0–52.0)
Hemoglobin: 12.8 g/dL — ABNORMAL LOW (ref 13.0–17.0)
Immature Granulocytes: 0 %
Lymphocytes Relative: 17 %
Lymphs Abs: 1.4 10*3/uL (ref 0.7–4.0)
MCH: 32.4 pg (ref 26.0–34.0)
MCHC: 32.7 g/dL (ref 30.0–36.0)
MCV: 99 fL (ref 80.0–100.0)
Monocytes Absolute: 0.5 10*3/uL (ref 0.1–1.0)
Monocytes Relative: 6 %
Neutro Abs: 6 10*3/uL (ref 1.7–7.7)
Neutrophils Relative %: 77 %
Platelet Count: 146 10*3/uL — ABNORMAL LOW (ref 150–400)
RBC: 3.95 MIL/uL — ABNORMAL LOW (ref 4.22–5.81)
RDW: 14.2 % (ref 11.5–15.5)
WBC Count: 7.9 10*3/uL (ref 4.0–10.5)
nRBC: 0 % (ref 0.0–0.2)

## 2023-01-13 LAB — CMP (CANCER CENTER ONLY)
ALT: 13 U/L (ref 0–44)
AST: 22 U/L (ref 15–41)
Albumin: 3.7 g/dL (ref 3.5–5.0)
Alkaline Phosphatase: 38 U/L (ref 38–126)
Anion gap: 3 — ABNORMAL LOW (ref 5–15)
BUN: 16 mg/dL (ref 8–23)
CO2: 26 mmol/L (ref 22–32)
Calcium: 8.9 mg/dL (ref 8.9–10.3)
Chloride: 109 mmol/L (ref 98–111)
Creatinine: 0.85 mg/dL (ref 0.61–1.24)
GFR, Estimated: >60 mL/min (ref >60)
Glucose, Bld: 116 mg/dL — ABNORMAL HIGH (ref 70–99)
Potassium: 4.2 mmol/L (ref 3.5–5.1)
Sodium: 138 mmol/L (ref 135–145)
Total Bilirubin: 0.8 mg/dL (ref 0.3–1.2)
Total Protein: 6.5 g/dL (ref 6.5–8.1)

## 2023-01-13 MED ORDER — DENOSUMAB 120 MG/1.7ML ~~LOC~~ SOLN
120.0000 mg | Freq: Once | SUBCUTANEOUS | Status: AC
  Administered 2023-01-13: 120 mg via SUBCUTANEOUS
  Filled 2023-01-13: qty 1.7

## 2023-01-13 MED ORDER — LEUPROLIDE ACETATE (4 MONTH) 30 MG ~~LOC~~ KIT
30.0000 mg | PACK | Freq: Once | SUBCUTANEOUS | Status: AC
  Administered 2023-01-13: 30 mg via SUBCUTANEOUS

## 2023-01-13 NOTE — Progress Notes (Signed)
Pt. Seen Dr. Mosetta Putt today.  Labs done.  Here for Xgeva injection.  Calcium results were 8.9.  Secure chatted Dr. Mosetta Putt and Olegario Shearer.  Ok to proceed with injection per Dr. Mosetta Putt

## 2023-01-13 NOTE — Assessment & Plan Note (Addendum)
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  I explained the logistics to him in detail, however patient is strongly against radiation therapy, he does not want to prolong his life, his goal of therapy is to improve his quality of life.  Also I think radiation will improve his pain control, he still declined RT --His leg edema has improved since he started ADT however his pain is still significant, I switched Casodex to Zytiga and prednisone, for better disease control and symptom relief. He started in June 2024. He responded well, both pain and leg edema has improved. He is tolerating well  -Due to his advanced age, limited social support, if he progressed on ADT, I would recommend hospice care.  He is not a candidate for chemotherapy -he is clinically doing well, repeated PSA today is pending.

## 2023-01-13 NOTE — Progress Notes (Signed)
[facility]   [phone]) [phone] Fax:([phone]) [phone]   Clinic Follow up Note   Patient Care Team: Tally Joe, MD as PCP - General (Family Medicine) Othella Boyer, MD as Consulting Physician (Cardiology) Tally Joe, MD as Attending Physician (Family Medicine) Cherlyn Cushing, RN as Oncology Nurse Navigator Malachy Mood, MD as Consulting Physician (Hematology and Oncology)  Date of Service:  01/13/2023  CHIEF COMPLAINT: f/u of Prostate Cancer metastatic to bone       CURRENT THERAPY:   Eigard Injection  q58months Zytiga 1000 mg daily Prednisone 5 mg daily  ASSESSMENT:  Alexander Rich is a 89 y.o. 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  I explained the logistics to him in detail, however patient is strongly against radiation therapy, he does not want to prolong his life, his goal of therapy is to improve his quality of life.  Also I think radiation will improve his pain control, he still declined RT --His leg edema has improved since he started ADT however his pain is still significant, I switched Casodex to Zytiga and prednisone, for better disease control and symptom relief. He started in June 2024. He responded well, both pain and leg edema has improved. He is tolerating well  -Due to his advanced age, limited social support, if he progressed on ADT, I would recommend hospice care.  He is not a candidate for chemotherapy -he is clinically doing well, repeated PSA today is pending.      PLAN: -lab reviewed -CMP -pending -PSA-pending -Proceed with Eligard  and Xgeva injection today -lab  and f/u in 2 months   SUMMARY OF ONCOLOGIC HISTORY: Oncology History Overview Note   Cancer Staging  Prostate cancer metastatic to bone [facility]) Staging form: Prostate, AJCC 8th Edition - Clinical: Stage IVB (cT3, cN1, pM1c, PSA: 587) - Signed by Josph Macho, MD on 07/17/2022 Prostate specific antigen (PSA) range: 20 or greater     Prostate cancer metastatic to bone (HCC)  07/17/2022 Initial Diagnosis   Prostate cancer metastatic to bone (HCC)   07/17/2022 Cancer Staging   Staging form: Prostate, AJCC 8th Edition - Clinical: Stage IVB (cT3, cN1, pM1c, PSA: 587) - Signed by Josph Macho, MD on 07/17/2022 Prostate specific antigen (PSA) range: 20 or greater   07/17/2022 Imaging    IMPRESSION: 1. Signs of prostate cancer with nodal and bone metastasis. 2. L3 metastatic lesion with extensive soft tissue extending into a narrowed central canal. Correlate with any acute symptoms of cord compression and consider MRI for further assessment as warranted. Superior endplate deformity at L3 likely a combination of Schmorl's node and pathologic fracture. Potential for early pathologic fracture at T1 as well. 3. Soft tissue along neurovascular structures in the LEFT pelvis raising the question of sciatic nerve involvement from tumor extension in this area this patient with LEFT-sided hip pain. 4. Bulky nodal disease in the pelvis likely explains lower extremity edema. 5. Bladder wall thickening which is largely circumferential likely related to bladder outlet obstruction with potential small bladder calculus. 6. Eccentric thickening of the sigmoid in the setting of diverticular changes is nonspecific.  Correlation with Cologuard testing could be considered to determine whether further evaluation may be warranted.      INTERVAL HISTORY:  Alexander Rich is here for a follow up of Prostate Cancer metastatic to bone. He was last seen by me on 12/02/2022. He presents to the clinic  alone. Pt state that he is doing well.Pt is taking the Zytiga, and he has no issues with the treatment. He states that his pain manageable.Pt is able to care for himself. He report that he had covid two months ago.    All other systems were reviewed with the patient and are negative.  MEDICAL HISTORY:  Past Medical History:  Diagnosis Date   Constipation    Hip pain    Hyperpigmented skin lesions 03/21/2022   Hypertension    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    SURGICAL HISTORY: Past Surgical History:  Procedure Laterality Date   BACK SURGERY     ESOPHAGOGASTRODUODENOSCOPY N/A 03/24/2022   Procedure: ESOPHAGOGASTRODUODENOSCOPY (EGD);  Surgeon: Willis Modena, MD;  Location: [facility];  Service: Gastroenterology;  Laterality: N/A;    I have reviewed the social history and family history with the patient and they are unchanged from previous note.  ALLERGIES:  is allergic to ibuprofen, nebivolol hcl, pravastatin, sertraline hcl, simvastatin, and trazodone.  MEDICATIONS:  Current Outpatient Medications  Medication Sig Dispense Refill   abiraterone acetate (ZYTIGA) 250 MG tablet Take 4 tablets (1,000 mg total) by mouth daily on an empty stomach 1 hour before meals OR 2 hours after a meal 120 tablet 2   acetaminophen (TYLENOL) 325 MG tablet Take 1 tablet (325 mg total) by mouth every 6 (six) hours as needed for mild pain (or Fever >/= 101).     ASCORBIC ACID PO Take 1 tablet by mouth in the morning. Vitamin C, unknown strength     cyanocobalamin 1000 MCG tablet Take 1 tablet (1,000 mcg total) by mouth daily.     furosemide (LASIX) 20 MG tablet Take 1 tablet (20 mg total) by mouth daily as needed for fluid or edema. (Patient taking differently: Take 20 mg by mouth daily.)     linaclotide (LINZESS) 145 MCG CAPS capsule Take 145 mcg by mouth daily as needed (constipation).     losartan (COZAAR) 100 MG tablet Take 1 tablet (100 mg total) by mouth in the morning.      oxyCODONE (OXY IR/ROXICODONE) 5 MG immediate release tablet Take 1-2 tablets (5-10 mg total) by mouth every 6 (six) hours as needed for severe pain. 60 tablet 0   Polyethyl Glycol-Propyl Glycol (SYSTANE OP) Place 1 drop into both eyes 4 (four) times daily as needed (dry eyes).     polyethylene glycol (MIRALAX / GLYCOLAX) 17 g packet Take 17 g by mouth daily as needed. Hold for diarrhea. Take when taking percocet 30 each 0   predniSONE (DELTASONE) 5 MG tablet Take 1 tablet (5 mg total) by mouth daily with breakfast. 90 tablet 1   tamsulosin (FLOMAX) 0.4 MG CAPS capsule Take 0.4 mg by mouth in the morning.     VITAMIN D PO Take 1 tablet by mouth daily.     No current facility-administered medications for this visit.    PHYSICAL EXAMINATION: ECOG PERFORMANCE STATUS: 1 - Symptomatic but completely ambulatory  Vitals:   01/13/23 1341  BP: 138/81  Pulse: 89  Resp: 16  Temp: 97.6 F (36.4 C)  SpO2: 99%   Wt Readings from Last 3 Encounters:  01/13/23 168  lb (76.2 kg)  12/02/22 176 lb 3.2 oz (79.9 kg)  11/04/22 173 lb 6.4 oz (78.7 kg)     GENERAL:alert, no distress and comfortable SKIN: skin color normal, no rashes or significant lesions EYES: normal, Conjunctiva are pink and non-injected, sclera clear  NEURO: alert & oriented x 3 with fluent speech  LABORATORY DATA:  I have reviewed the data as listed    Latest Ref Rng & Units 01/13/2023    1:28 PM 12/02/2022   10:56 AM 11/03/2022    5:57 AM  CBC  WBC 4.0 - 10.5 K/uL 7.9  5.8  5.5   Hemoglobin 13.0 - 17.0 g/dL 16.1  09.6  04.5   Hematocrit 39.0 - 52.0 % 39.1  34.5  36.2   Platelets 150 - 400 K/uL 146  136  104         Latest Ref Rng & Units 01/13/2023    1:28 PM 12/02/2022   10:56 AM 11/04/2022    5:47 AM  CMP  Glucose 70 - 99 mg/dL 409  811  914   BUN 8 - 23 mg/dL 16  18  14    Creatinine 0.61 - 1.24 mg/dL 7.82  9.56  2.13   Sodium 135 - 145 mmol/L 138  139  134   Potassium 3.5 - 5.1 mmol/L 4.2  3.8  3.4   Chloride 98 - 111  mmol/L 109  111  107   CO2 22 - 32 mmol/L 26  23  19    Calcium 8.9 - 10.3 mg/dL 8.9  8.4  7.4   Total Protein 6.5 - 8.1 g/dL 6.5  5.8    Total Bilirubin 0.3 - 1.2 mg/dL 0.8  1.0    Alkaline Phos 38 - 126 U/L 38  43    AST 15 - 41 U/L 22  17    ALT 0 - 44 U/L 13  7        RADIOGRAPHIC STUDIES: I have personally reviewed the radiological images as listed and agreed with the findings in the report. No results found.    Orders Placed This Encounter  Procedures   Vitamin B12    Standing Status:   Standing    Number of Occurrences:   10    Standing Expiration Date:   01/13/2024   All questions were answered. The patient knows to call the clinic with any problems, questions or concerns. No barriers to learning was detected. The total time spent in the appointment was 25 minutes.     Malachy Mood, MD 01/13/2023   Carolin Coy, CMA, am acting as scribe for Malachy Mood, MD.   I have reviewed the above documentation for accuracy and completeness, and I agree with the above.

## 2023-01-13 NOTE — Patient Instructions (Signed)
Denosumab Injection (Oncology) What is this medication? DENOSUMAB (den oh SUE mab) prevents weakened bones caused by cancer. It may also be used to treat noncancerous bone tumors that cannot be removed by surgery. It can also be used to treat high calcium levels in the blood caused by cancer. It works by blocking a protein that causes bones to break down quickly. This slows down the release of calcium from bones, which lowers calcium levels in your blood. It also makes your bones stronger and less likely to break (fracture). This medicine may be used for other purposes; ask your health care provider or pharmacist if you have questions. COMMON BRAND NAME(S): XGEVA What should I tell my care team before I take this medication? They need to know if you have any of these conditions: Dental disease Having surgery or tooth extraction Infection Kidney disease Low levels of calcium or vitamin D in the blood Malnutrition On hemodialysis Skin conditions or sensitivity Thyroid or parathyroid disease An unusual reaction to denosumab, other medications, foods, dyes, or preservatives Pregnant or trying to get pregnant Breast-feeding How should I use this medication? This medication is for injection under the skin. It is given by your care team in a hospital or clinic setting. A special MedGuide will be given to you before each treatment. Be sure to read this information carefully each time. Talk to your care team about the use of this medication in children. While it may be prescribed for children as young as 13 years for selected conditions, precautions do apply. Overdosage: If you think you have taken too much of this medicine contact a poison control center or emergency room at once. NOTE: This medicine is only for you. Do not share this medicine with others. What if I miss a dose? Keep appointments for follow-up doses. It is important not to miss your dose. Call your care team if you are unable to  keep an appointment. What may interact with this medication? Do not take this medication with any of the following: Other medications containing denosumab This medication may also interact with the following: Medications that lower your chance of fighting infection Steroid medications, such as prednisone or cortisone This list may not describe all possible interactions. Give your health care provider a list of all the medicines, herbs, non-prescription drugs, or dietary supplements you use. Also tell them if you smoke, drink alcohol, or use illegal drugs. Some items may interact with your medicine. What should I watch for while using this medication? Your condition will be monitored carefully while you are receiving this medication. You may need blood work while taking this medication. This medication may increase your risk of getting an infection. Call your care team for advice if you get a fever, chills, sore throat, or other symptoms of a cold or flu. Do not treat yourself. Try to avoid being around people who are sick. You should make sure you get enough calcium and vitamin D while you are taking this medication, unless your care team tells you not to. Discuss the foods you eat and the vitamins you take with your care team. Some people who take this medication have severe bone, joint, or muscle pain. This medication may also increase your risk for jaw problems or a broken thigh bone. Tell your care team right away if you have severe pain in your jaw, bones, joints, or muscles. Tell your care team if you have any pain that does not go away or that gets worse. Talk  to your care team if you may be pregnant. Serious birth defects can occur if you take this medication during pregnancy and for 5 months after the last dose. You will need a negative pregnancy test before starting this medication. Contraception is recommended while taking this medication and for 5 months after the last dose. Your care team  can help you find the option that works for you. What side effects may I notice from receiving this medication? Side effects that you should report to your care team as soon as possible: Allergic reactions--skin rash, itching, hives, swelling of the face, lips, tongue, or throat Bone, joint, or muscle pain Low calcium level--muscle pain or cramps, confusion, tingling, or numbness in the hands or feet Osteonecrosis of the jaw--pain, swelling, or redness in the mouth, numbness of the jaw, poor healing after dental work, unusual discharge from the mouth, visible bones in the mouth Side effects that usually do not require medical attention (report to your care team if they continue or are bothersome): Cough Diarrhea Fatigue Headache Nausea This list may not describe all possible side effects. Call your doctor for medical advice about side effects. You may report side effects to FDA at 1-800-FDA-1088. Where should I keep my medication? This medication is given in a hospital or clinic. It will not be stored at home. NOTE: This sheet is a summary. It may not cover all possible information. If you have questions about this medicine, talk to your doctor, pharmacist, or health care provider.  2024 Elsevier/Gold Standard (2021-09-10 00:00:00) Leuprolide Suspension for Injection (Prostate Cancer) What is this medication? LEUPROLIDE (loo PROE lide) reduces the symptoms of prostate cancer. It works by decreasing levels of the hormone testosterone in the body. This prevents prostate cancer cells from spreading or growing. This medicine may be used for other purposes; ask your health care provider or pharmacist if you have questions. COMMON BRAND NAME(S): Eligard, Lupron Depot, Lutrate Depot What should I tell my care team before I take this medication? They need to know if you have any of these conditions: Diabetes Heart disease Heart failure High or low levels of electrolytes, such as magnesium,  potassium, or sodium in your blood Irregular heartbeat or rhythm Seizures An unusual or allergic reaction to leuprolide, other medications, foods, dyes, or preservatives Pregnant or trying to get pregnant Breast-feeding How should I use this medication? This medication is injected under the skin or into a muscle. It is given by your care team in a hospital or clinic setting. Talk to your care team about the use of this medication in children. Special care may be needed. Overdosage: If you think you have taken too much of this medicine contact a poison control center or emergency room at once. NOTE: This medicine is only for you. Do not share this medicine with others. What if I miss a dose? Keep appointments for follow-up doses. It is important not to miss your dose. Call your care team if you are unable to keep an appointment. What may interact with this medication? Do not take this medication with any of the following: Cisapride Dronedarone Ketoconazole Levoketoconazole Pimozide Thioridazine This medication may also interact with the following: Other medications that cause heart rhythm changes This list may not describe all possible interactions. Give your health care provider a list of all the medicines, herbs, non-prescription drugs, or dietary supplements you use. Also tell them if you smoke, drink alcohol, or use illegal drugs. Some items may interact with your medicine. What  should I watch for while using this medication? Visit your care team for regular checks on your progress. Tell your care team if your symptoms do not start to get better or if they get worse. This medication may increase blood sugar. The risk may be higher in patients who already have diabetes. Ask your care team what you can do to lower the risk of diabetes while taking this medication. This medication may cause infertility. Talk to your care team if you are concerned about your fertility. Heart attacks and  strokes have been reported with the use of this medication. Get emergency help if you develop signs or symptoms of a heart attack or stroke. Talk to your care team about the risks and benefits of this medication. What side effects may I notice from receiving this medication? Side effects that you should report to your care team as soon as possible: Allergic reactions--skin rash, itching, hives, swelling of the face, lips, tongue, or throat Heart attack--pain or tightness in the chest, shoulders, arms, or jaw, nausea, shortness of breath, cold or clammy skin, feeling faint or lightheaded Heart rhythm changes--fast or irregular heartbeat, dizziness, feeling faint or lightheaded, chest pain, trouble breathing High blood sugar (hyperglycemia)--increased thirst or amount of urine, unusual weakness or fatigue, blurry vision New or worsening seizures Redness, blistering, peeling, or loosening of the skin, including inside the mouth Stroke--sudden numbness or weakness of the face, arm, or leg, trouble speaking, confusion, trouble walking, loss of balance or coordination, dizziness, severe headache, change in vision Swelling and pain of the tumor site or lymph nodes Side effects that usually do not require medical attention (report these to your care team if they continue or are bothersome): Change in sex drive or performance Hot flashes Joint pain Pain, redness, or irritation at injection site Swelling of the ankles, hands, or feet Unusual weakness or fatigue This list may not describe all possible side effects. Call your doctor for medical advice about side effects. You may report side effects to FDA at 1-800-FDA-1088. Where should I keep my medication? This medication is given in a hospital or clinic. It will not be stored at home. NOTE: This sheet is a summary. It may not cover all possible information. If you have questions about this medicine, talk to your doctor, pharmacist, or health care  provider.  2024 Elsevier/Gold Standard (2022-08-21 00:00:00)

## 2023-01-14 ENCOUNTER — Other Ambulatory Visit (HOSPITAL_COMMUNITY): Payer: Self-pay

## 2023-01-14 ENCOUNTER — Other Ambulatory Visit: Payer: Self-pay | Admitting: Hematology

## 2023-01-14 LAB — PROSTATE-SPECIFIC AG, SERUM (LABCORP): Prostate Specific Ag, Serum: 0.1 ng/mL (ref 0.0–4.0)

## 2023-01-18 ENCOUNTER — Other Ambulatory Visit (HOSPITAL_COMMUNITY): Payer: Self-pay

## 2023-01-18 ENCOUNTER — Other Ambulatory Visit: Payer: Self-pay | Admitting: Hematology

## 2023-01-18 ENCOUNTER — Other Ambulatory Visit: Payer: Self-pay

## 2023-01-18 DIAGNOSIS — Z23 Encounter for immunization: Secondary | ICD-10-CM | POA: Diagnosis not present

## 2023-01-18 DIAGNOSIS — E782 Mixed hyperlipidemia: Secondary | ICD-10-CM | POA: Diagnosis not present

## 2023-01-18 DIAGNOSIS — K5900 Constipation, unspecified: Secondary | ICD-10-CM | POA: Diagnosis not present

## 2023-01-18 DIAGNOSIS — I872 Venous insufficiency (chronic) (peripheral): Secondary | ICD-10-CM | POA: Diagnosis not present

## 2023-01-18 DIAGNOSIS — E559 Vitamin D deficiency, unspecified: Secondary | ICD-10-CM | POA: Diagnosis not present

## 2023-01-18 DIAGNOSIS — I119 Hypertensive heart disease without heart failure: Secondary | ICD-10-CM | POA: Diagnosis not present

## 2023-01-18 DIAGNOSIS — M5416 Radiculopathy, lumbar region: Secondary | ICD-10-CM | POA: Diagnosis not present

## 2023-01-18 MED ORDER — ABIRATERONE ACETATE 250 MG PO TABS
1000.0000 mg | ORAL_TABLET | Freq: Every day | ORAL | 2 refills | Status: DC
Start: 2023-01-18 — End: 2023-04-19
  Filled 2023-01-18: qty 120, 30d supply, fill #0
  Filled 2023-02-16: qty 120, 30d supply, fill #1
  Filled 2023-03-23: qty 120, 30d supply, fill #2

## 2023-01-20 ENCOUNTER — Other Ambulatory Visit (HOSPITAL_COMMUNITY): Payer: Self-pay

## 2023-01-25 ENCOUNTER — Other Ambulatory Visit: Payer: Self-pay

## 2023-01-26 ENCOUNTER — Other Ambulatory Visit: Payer: Self-pay

## 2023-01-26 ENCOUNTER — Telehealth: Payer: Self-pay

## 2023-01-26 NOTE — Telephone Encounter (Signed)
Pt called and had some questions about his oral chemo. I let the pt know that I will reached to the Pharmacist and have them to call and answer any questions he may have concerning the medication. Pt verbalized understanding and I sent a message the pharmacist.  Rondel Jumbo, CMA

## 2023-01-27 ENCOUNTER — Other Ambulatory Visit (HOSPITAL_COMMUNITY): Payer: Self-pay

## 2023-01-27 ENCOUNTER — Other Ambulatory Visit: Payer: Self-pay

## 2023-02-01 ENCOUNTER — Telehealth: Payer: Self-pay

## 2023-02-01 ENCOUNTER — Other Ambulatory Visit: Payer: Self-pay | Admitting: Hematology

## 2023-02-01 MED ORDER — OXYCODONE HCL 5 MG PO TABS: 5.0000 mg | ORAL_TABLET | Freq: Four times a day (QID) | ORAL | 0 refills | Status: DC | PRN

## 2023-02-01 NOTE — Telephone Encounter (Signed)
Pt called wanting a refill on his Oxycodone. He was told to call the office if he needs a refill he stated per Dr.Feng, I told the pt  that I will get his message to the provide. Pt verbalized understanding.     Boneta Standre M,CMA

## 2023-02-01 NOTE — Telephone Encounter (Signed)
Open in error

## 2023-02-16 ENCOUNTER — Encounter: Payer: Self-pay | Admitting: Hematology

## 2023-02-16 ENCOUNTER — Other Ambulatory Visit: Payer: Self-pay

## 2023-02-16 ENCOUNTER — Other Ambulatory Visit (HOSPITAL_COMMUNITY): Payer: Self-pay

## 2023-02-16 NOTE — Progress Notes (Signed)
Specialty Pharmacy Refill Coordination Note  Alexander Rich is a 89 y.o. male contacted today regarding refills of specialty medication(s) Abiraterone Acetate   Patient requested Delivery   Delivery date: 02/23/23   Verified address: [address]   [place] [id number]   Medication will be filled on 02/22/23.

## 2023-02-22 ENCOUNTER — Encounter: Payer: Self-pay | Admitting: Hematology

## 2023-02-22 ENCOUNTER — Other Ambulatory Visit: Payer: Self-pay

## 2023-03-03 ENCOUNTER — Telehealth: Payer: Self-pay

## 2023-03-03 ENCOUNTER — Other Ambulatory Visit: Payer: Self-pay | Admitting: Hematology

## 2023-03-03 ENCOUNTER — Other Ambulatory Visit: Payer: Self-pay

## 2023-03-03 MED ORDER — PREDNISONE 5 MG PO TABS: 5.0000 mg | ORAL_TABLET | Freq: Every day | ORAL | 1 refills | Status: DC

## 2023-03-03 MED ORDER — OXYCODONE HCL 5 MG PO TABS: 5.0000 mg | ORAL_TABLET | Freq: Four times a day (QID) | ORAL | 0 refills | Status: DC | PRN

## 2023-03-03 NOTE — Telephone Encounter (Signed)
Pt called requesting refill on Oxycodone.  Notified Dr. Mosetta Putt and Santiago Glad, NP of pt's request.

## 2023-03-10 ENCOUNTER — Encounter: Payer: Self-pay | Admitting: Hematology

## 2023-03-10 ENCOUNTER — Inpatient Hospital Stay: Payer: Medicare Other

## 2023-03-10 ENCOUNTER — Inpatient Hospital Stay: Payer: Medicare Other | Attending: Hematology

## 2023-03-10 ENCOUNTER — Inpatient Hospital Stay: Payer: Medicare Other | Admitting: Hematology

## 2023-03-10 VITALS — BP 139/83 | HR 85 | Temp 98.0°F | Resp 18 | Ht 67.0 in | Wt 169.1 lb

## 2023-03-10 DIAGNOSIS — C7951 Secondary malignant neoplasm of bone: Secondary | ICD-10-CM | POA: Diagnosis not present

## 2023-03-10 DIAGNOSIS — E538 Deficiency of other specified B group vitamins: Secondary | ICD-10-CM | POA: Diagnosis not present

## 2023-03-10 DIAGNOSIS — C61 Malignant neoplasm of prostate: Secondary | ICD-10-CM

## 2023-03-10 LAB — CMP (CANCER CENTER ONLY)
ALT: 11 U/L (ref 0–44)
AST: 20 U/L (ref 15–41)
Albumin: 3.6 g/dL (ref 3.5–5.0)
Alkaline Phosphatase: 30 U/L — ABNORMAL LOW (ref 38–126)
Anion gap: 3 — ABNORMAL LOW (ref 5–15)
BUN: 18 mg/dL (ref 8–23)
CO2: 27 mmol/L (ref 22–32)
Calcium: 9 mg/dL (ref 8.9–10.3)
Chloride: 109 mmol/L (ref 98–111)
Creatinine: 0.93 mg/dL (ref 0.61–1.24)
GFR, Estimated: >60 mL/min (ref >60)
Glucose, Bld: 131 mg/dL — ABNORMAL HIGH (ref 70–99)
Potassium: 4.4 mmol/L (ref 3.5–5.1)
Sodium: 139 mmol/L (ref 135–145)
Total Bilirubin: 0.7 mg/dL (ref <1.2)
Total Protein: 6.2 g/dL — ABNORMAL LOW (ref 6.5–8.1)

## 2023-03-10 LAB — CBC WITH DIFFERENTIAL (CANCER CENTER ONLY)
Abs Immature Granulocytes: 0.01 10*3/uL (ref 0.00–0.07)
Basophils Absolute: 0 10*3/uL (ref 0.0–0.1)
Basophils Relative: 0 %
Eosinophils Absolute: 0 10*3/uL (ref 0.0–0.5)
Eosinophils Relative: 0 %
HCT: 37.5 % — ABNORMAL LOW (ref 39.0–52.0)
Hemoglobin: 12.3 g/dL — ABNORMAL LOW (ref 13.0–17.0)
Immature Granulocytes: 0 %
Lymphocytes Relative: 17 %
Lymphs Abs: 1.3 10*3/uL (ref 0.7–4.0)
MCH: 33.2 pg (ref 26.0–34.0)
MCHC: 32.8 g/dL (ref 30.0–36.0)
MCV: 101.4 fL — ABNORMAL HIGH (ref 80.0–100.0)
Monocytes Absolute: 0.5 10*3/uL (ref 0.1–1.0)
Monocytes Relative: 6 %
Neutro Abs: 5.9 10*3/uL (ref 1.7–7.7)
Neutrophils Relative %: 77 %
Platelet Count: 137 10*3/uL — ABNORMAL LOW (ref 150–400)
RBC: 3.7 MIL/uL — ABNORMAL LOW (ref 4.22–5.81)
RDW: 14.2 % (ref 11.5–15.5)
WBC Count: 7.8 10*3/uL (ref 4.0–10.5)
nRBC: 0 % (ref 0.0–0.2)

## 2023-03-10 LAB — VITAMIN B12: Vitamin B-12: 441 pg/mL (ref 180–914)

## 2023-03-10 MED ORDER — CYANOCOBALAMIN 1000 MCG/ML IJ SOLN
1000.0000 ug | Freq: Once | INTRAMUSCULAR | Status: AC
  Administered 2023-03-10: 1000 ug via INTRAMUSCULAR
  Filled 2023-03-10: qty 1

## 2023-03-10 MED ORDER — DENOSUMAB 120 MG/1.7ML ~~LOC~~ SOLN
120.0000 mg | Freq: Once | SUBCUTANEOUS | Status: AC
  Administered 2023-03-10: 120 mg via SUBCUTANEOUS
  Filled 2023-03-10: qty 1.7

## 2023-03-10 MED ORDER — LEUPROLIDE ACETATE (4 MONTH) 30 MG ~~LOC~~ KIT: 30.0000 mg | PACK | Freq: Once | SUBCUTANEOUS | Status: DC

## 2023-03-10 NOTE — Assessment & Plan Note (Signed)
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  I explained the logistics to him in detail, however patient is strongly against radiation therapy, he does not want to prolong his life, his goal of therapy is to improve his quality of life.  Also I think radiation will improve his pain control, he still declined RT --His leg edema has improved since he started ADT however his pain is still significant, I switched Casodex to Zytiga and prednisone, for better disease control and symptom relief. He started in June 2024. He responded well, both pain and leg edema has improved. He is tolerating well  -Due to his advanced age, limited social support, if he progressed on ADT, I would recommend hospice care.  He is not a candidate for chemotherapy

## 2023-03-10 NOTE — Progress Notes (Signed)
[facility]   [phone] [phone]   Clinic Follow up Note   Patient Care Team: Tally Joe, MD as PCP - General (Family Medicine) Othella Boyer, MD as Consulting Physician (Cardiology) Tally Joe, MD as Attending Physician (Family Medicine) Cherlyn Cushing, RN as Oncology Nurse Navigator Malachy Mood, MD as Consulting Physician (Hematology and Oncology)  Date of Service:  03/10/2023  CHIEF COMPLAINT: f/u of metastatic prostate cancer  CURRENT THERAPY:  Eligard every 4 months, Zytiga 1000 mg daily and prednisone 5 mg daily Xgeva every 2 months  Oncology History   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  I explained the logistics to him in detail, however patient is strongly against radiation therapy, he does not want to prolong his life, his goal of therapy is to improve his quality of life.  Also I think radiation will improve his pain control, he still declined RT --His leg edema has improved since he started ADT however his pain is still significant, I switched Casodex to Zytiga and prednisone, for better disease control and symptom relief. He started in June 2024. He responded well, both pain and leg edema has improved. He is tolerating well  -Due to his advanced age, limited social support, if he progressed on ADT, I would recommend hospice care.  He is not a candidate for chemotherapy    Assessment and Plan    Metastatic Prostate Cancer Patient reports feeling well with no pain. Currently on Zytiga and Prednisone with no reported side effects. Last PSA in September was 0.1, indicating  good response to treatment. -Continue Zytiga and Prednisone as prescribed. -Administer bone shot today. -today PSA results is still pending   B12 deficiency -Started B12 injection in July 2024 -He is on oral B complex -Today's B12 level still pending, will proceed injection every 2 months  General Health Maintenance Patient lives alone but has family nearby for support. Reports good appetite and energy, though wishes for more. Reports dry skin and a rash, which is being managed by a dermatologist. -Continue current lifestyle and self-care practices. -Check B12 levels and adjust treatment as necessary based on results. -Continue regular dental check-ups every six months. -Schedule follow-up visit in two months.     Plan -Lab reviewed, will proceed to Xgeva injection today and continue every 2 months -He will continue Zytiga and prednisone at home -Lab, follow-up, injection in 2 months    SUMMARY OF ONCOLOGIC HISTORY: Oncology History Overview Note   Cancer Staging  Prostate cancer metastatic to bone [facility]) Staging form: Prostate, AJCC 8th Edition - Clinical: Stage IVB (cT3, cN1, pM1c, PSA: 587) - Signed by Josph Macho, MD on 07/17/2022 Prostate specific antigen (PSA) range: 20 or greater     Prostate cancer metastatic to bone (HCC)  07/17/2022 Initial Diagnosis   Prostate cancer metastatic to bone (HCC)   07/17/2022 Cancer Staging   Staging form: Prostate, AJCC 8th Edition - Clinical: Stage IVB (cT3, cN1, pM1c, PSA: 587) - Signed by Josph Macho, MD on 07/17/2022 Prostate specific antigen (PSA) range: 20 or greater   07/17/2022 Imaging    IMPRESSION: 1. Signs of  prostate cancer with nodal and bone metastasis. 2. L3 metastatic lesion with extensive soft tissue extending into a narrowed central canal. Correlate with any acute symptoms of cord compression and consider MRI for further assessment as warranted. Superior endplate deformity at L3 likely a combination of  Schmorl's node and pathologic fracture. Potential for early pathologic fracture at T1 as well. 3. Soft tissue along neurovascular structures in the LEFT pelvis raising the question of sciatic nerve involvement from tumor extension in this area this patient with LEFT-sided hip pain. 4. Bulky nodal disease in the pelvis likely explains lower extremity edema. 5. Bladder wall thickening which is largely circumferential likely related to bladder outlet obstruction with potential small bladder calculus. 6. Eccentric thickening of the sigmoid in the setting of diverticular changes is nonspecific. Correlation with Cologuard testing could be considered to determine whether further evaluation may be warranted.      Discussed the use of AI scribe software for clinical note transcription with the patient, who gave verbal consent to proceed.  History of Present Illness   The patient, a 89 year old gentleman with metastatic prostate cancer, presents for a routine follow-up. He reports feeling good and has noticed significant improvements since starting treatment with Zytiga and prednisone. He has not missed a day of medication and has not experienced any problems with the regimen. He also takes vitamin C, D3, and Zinc without any issues. He reports no pain and has a good appetite. However, he wishes he had more energy, although he acknowledges that he spends a lot of time watching TV. He lives alone but has a niece and nephew nearby who are willing to help if needed. He manages his own shopping and hires someone to take care of his yard. He uses a walker when going out. He has some dry skin issues and a rash on his face, for which he sees a dermatologist. He also has some shoulder issues that limit his arm movement. He is due for a bone shot today and will continue with Eligard every four months. He also takes B complex and has regular dental check-ups every six months.         All other systems were  reviewed with the patient and are negative.  MEDICAL HISTORY:  Past Medical History:  Diagnosis Date   Constipation    Hip pain    Hyperpigmented skin lesions 03/21/2022   Hypertension    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    SURGICAL HISTORY: Past Surgical History:  Procedure Laterality Date   BACK SURGERY     ESOPHAGOGASTRODUODENOSCOPY N/A 03/24/2022   Procedure: ESOPHAGOGASTRODUODENOSCOPY (EGD);  Surgeon: Willis Modena, MD;  Location: [facility];  Service: Gastroenterology;  Laterality: N/A;    I have reviewed the social history and family history with the patient and they are unchanged from previous note.  ALLERGIES:  is allergic to ibuprofen, nebivolol hcl, pravastatin, sertraline hcl, simvastatin, and trazodone.  MEDICATIONS:  Current Outpatient Medications  Medication Sig Dispense Refill   abiraterone acetate (ZYTIGA) 250 MG tablet Take 4 tablets (1,000 mg total) by mouth daily on an empty stomach 1 hour before meals OR 2 hours after a meal 120 tablet 2   acetaminophen (TYLENOL) 325 MG tablet Take 1 tablet (325 mg total) by mouth every 6 (six) hours as needed for mild pain (or Fever >/= 101).     ASCORBIC ACID PO Take 1 tablet by mouth in the morning. Vitamin C, unknown strength     cyanocobalamin 1000  MCG tablet Take 1 tablet (1,000 mcg total) by mouth daily.     furosemide (LASIX) 20 MG tablet Take 1 tablet (20 mg total) by mouth daily as needed for fluid or edema. (Patient taking differently: Take 20 mg by mouth daily.)     linaclotide (LINZESS) 145 MCG CAPS capsule Take 145 mcg by mouth daily as needed (constipation).     losartan (COZAAR) 100 MG tablet Take 1 tablet (100 mg total) by mouth in the morning.     oxyCODONE (OXY IR/ROXICODONE) 5 MG immediate release tablet Take 1-2 tablets (5-10 mg total) by mouth every 6 (six) hours as needed for severe pain (pain score 7-10). 60 tablet 0   Polyethyl Glycol-Propyl Glycol (SYSTANE OP) Place 1 drop into  both eyes 4 (four) times daily as needed (dry eyes).     polyethylene glycol (MIRALAX / GLYCOLAX) 17 g packet Take 17 g by mouth daily as needed. Hold for diarrhea. Take when taking percocet 30 each 0   predniSONE (DELTASONE) 5 MG tablet Take 1 tablet (5 mg total) by mouth daily with breakfast. 90 tablet 1   tamsulosin (FLOMAX) 0.4 MG CAPS capsule Take 0.4 mg by mouth in the morning.     VITAMIN D PO Take 1 tablet by mouth daily.     No current facility-administered medications for this visit.    PHYSICAL EXAMINATION: ECOG PERFORMANCE STATUS: 1 - Symptomatic but completely ambulatory  Vitals:   03/10/23 1351 03/10/23 1352  BP: (!) 149/77 139/83  Pulse: 85   Resp: 18   Temp: 98 F (36.7 C)   SpO2: 100%    Wt Readings from Last 3 Encounters:  03/10/23 169 lb 2 oz (76.7 kg)  01/13/23 168 lb (76.2 kg)  12/02/22 176 lb 3.2 oz (79.9 kg)     GENERAL:alert, no distress and comfortable SKIN: skin color, texture, turgor are normal, no rashes or significant lesions EYES: normal, Conjunctiva are pink and non-injected, sclera clear NECK: supple, thyroid normal size, non-tender, without nodularity LYMPH:  no palpable lymphadenopathy in the cervical, axillary  LUNGS: clear to auscultation and percussion with normal breathing effort HEART: regular rate & rhythm and no murmurs and no lower extremity edema ABDOMEN:abdomen soft, non-tender and normal bowel sounds Musculoskeletal:no cyanosis of digits and no clubbing  NEURO: alert & oriented x 3 with fluent speech, no focal motor/sensory deficits       LABORATORY DATA:  I have reviewed the data as listed    Latest Ref Rng & Units 03/10/2023    1:30 PM 01/13/2023    1:28 PM 12/02/2022   10:56 AM  CBC  WBC 4.0 - 10.5 K/uL 7.8  7.9  5.8   Hemoglobin 13.0 - 17.0 g/dL 16.1  09.6  04.5   Hematocrit 39.0 - 52.0 % 37.5  39.1  34.5   Platelets 150 - 400 K/uL 137  146  136         Latest Ref Rng & Units 03/10/2023    1:30 PM 01/13/2023     1:28 PM 12/02/2022   10:56 AM  CMP  Glucose 70 - 99 mg/dL 409  811  914   BUN 8 - 23 mg/dL 18  16  18    Creatinine 0.61 - 1.24 mg/dL 7.82  9.56  2.13   Sodium 135 - 145 mmol/L 139  138  139   Potassium 3.5 - 5.1 mmol/L 4.4  4.2  3.8   Chloride 98 - 111 mmol/L 109  109  111  CO2 22 - 32 mmol/L 27  26  23    Calcium 8.9 - 10.3 mg/dL 9.0  8.9  8.4   Total Protein 6.5 - 8.1 g/dL 6.2  6.5  5.8   Total Bilirubin <1.2 mg/dL 0.7  0.8  1.0   Alkaline Phos 38 - 126 U/L 30  38  43   AST 15 - 41 U/L 20  22  17    ALT 0 - 44 U/L 11  13  7        RADIOGRAPHIC STUDIES: I have personally reviewed the radiological images as listed and agreed with the findings in the report. No results found.    No orders of the defined types were placed in this encounter.  All questions were answered. The patient knows to call the clinic with any problems, questions or concerns. No barriers to learning was detected. The total time spent in the appointment was 25 minutes.     Malachy Mood, MD 03/10/2023

## 2023-03-11 ENCOUNTER — Other Ambulatory Visit: Payer: Self-pay

## 2023-03-11 LAB — PROSTATE-SPECIFIC AG, SERUM (LABCORP): Prostate Specific Ag, Serum: <0.1 ng/mL (ref 0.0–4.0)

## 2023-03-15 ENCOUNTER — Telehealth: Payer: Self-pay

## 2023-03-15 NOTE — Telephone Encounter (Signed)
Pt advised of lab results and recommendations for B12 injections. He was very pleased with the results and grateful for the call

## 2023-03-15 NOTE — Telephone Encounter (Signed)
-----   Message from Malachy Mood sent at 03/14/2023 10:11 PM EST ----- Please let pt know his lab results, overall good, PSA is undetectable, his prostate cancer is very well controlled. B12 level improved and will continue B12 injection every 2 month, thanks   Malachy Mood

## 2023-03-16 ENCOUNTER — Telehealth: Payer: Self-pay | Admitting: Pharmacy Technician

## 2023-03-16 ENCOUNTER — Encounter: Payer: Self-pay | Admitting: Hematology

## 2023-03-16 ENCOUNTER — Other Ambulatory Visit (HOSPITAL_COMMUNITY): Payer: Self-pay

## 2023-03-16 NOTE — Telephone Encounter (Signed)
Oral Oncology Patient Advocate Encounter   Was successful in securing patient a $7,500 grant from [employer] to provide copayment coverage for abiraterone.  This will keep the out of pocket expense at $0.     I have spoken with the patient.    The billing information is as follows and has been shared with [facility].   Member ID: [id number] Group ID: [facility] RxBin: [id number] PCN: [id number] Dates of Eligibility: 03/16/23 through 03/15/24  Fund name:  Metastatic Prostate   Jinger Neighbors, CPhT-Adv Oncology Pharmacy Patient Advocate [facility] Direct Number: [phone]  Fax: [phone]

## 2023-03-23 ENCOUNTER — Other Ambulatory Visit: Payer: Self-pay

## 2023-03-23 ENCOUNTER — Encounter: Payer: Self-pay | Admitting: Hematology

## 2023-03-23 NOTE — Progress Notes (Signed)
Specialty Pharmacy Refill Coordination Note  Alexander Rich is a 89 y.o. male contacted today regarding refills of specialty medication(s) Abiraterone Acetate   Patient requested Delivery   Delivery date: 03/29/23   Verified address: [address]   [place] [id number]   Medication will be filled on 03/26/23.

## 2023-03-25 ENCOUNTER — Other Ambulatory Visit: Payer: Self-pay

## 2023-03-26 ENCOUNTER — Encounter: Payer: Self-pay | Admitting: Hematology

## 2023-03-26 ENCOUNTER — Other Ambulatory Visit: Payer: Self-pay

## 2023-03-31 ENCOUNTER — Telehealth: Payer: Self-pay

## 2023-03-31 ENCOUNTER — Other Ambulatory Visit: Payer: Self-pay | Admitting: Hematology

## 2023-03-31 ENCOUNTER — Other Ambulatory Visit: Payer: Self-pay | Admitting: Nurse Practitioner

## 2023-03-31 MED ORDER — OXYCODONE HCL 5 MG PO TABS: 5.0000 mg | ORAL_TABLET | Freq: Four times a day (QID) | ORAL | 0 refills | Status: DC | PRN

## 2023-03-31 NOTE — Telephone Encounter (Signed)
Patient called in wanting to get a refill of his Oxycodone 5mg  called in. He stated he knew we would be out of the office the rest of the week and wanted to make sure he didn't run out.

## 2023-04-05 ENCOUNTER — Ambulatory Visit (INDEPENDENT_AMBULATORY_CARE_PROVIDER_SITE_OTHER): Payer: Medicare Other | Admitting: Podiatry

## 2023-04-05 ENCOUNTER — Encounter: Payer: Self-pay | Admitting: Podiatry

## 2023-04-05 VITALS — Ht 67.0 in | Wt 170.0 lb

## 2023-04-05 DIAGNOSIS — M79675 Pain in left toe(s): Secondary | ICD-10-CM

## 2023-04-05 DIAGNOSIS — B351 Tinea unguium: Secondary | ICD-10-CM

## 2023-04-05 DIAGNOSIS — M79674 Pain in right toe(s): Secondary | ICD-10-CM | POA: Diagnosis not present

## 2023-04-05 NOTE — Progress Notes (Signed)
Subjective:   Patient ID: Alexander Rich, male   DOB: 89 y.o.   MRN: [id number]   HPI Patient presents with elongated nailbeds 1-5 both feet thick yellow and brittle and painful   ROS      Objective:  Physical Exam  Chronic mycotic nail infection with pain 1-5 both feet with thick yellow brittle nailbeds     Assessment:  Severe mycotic nail infection with pain 1-5 both feet     Plan:  Debridement nailbeds 1-5 both feet no iatrogenic bleeding reappoint routine care

## 2023-04-19 ENCOUNTER — Other Ambulatory Visit: Payer: Self-pay

## 2023-04-19 ENCOUNTER — Other Ambulatory Visit: Payer: Self-pay | Admitting: Hematology

## 2023-04-19 ENCOUNTER — Encounter: Payer: Self-pay | Admitting: Hematology

## 2023-04-19 ENCOUNTER — Other Ambulatory Visit (HOSPITAL_COMMUNITY): Payer: Self-pay

## 2023-04-19 MED ORDER — ABIRATERONE ACETATE 250 MG PO TABS
1000.0000 mg | ORAL_TABLET | Freq: Every day | ORAL | 2 refills | Status: DC
  Filled 2023-04-19: qty 120, 30d supply, fill #0
  Filled 2023-05-11: qty 120, 30d supply, fill #1
  Filled 2023-06-10: qty 120, 30d supply, fill #2

## 2023-04-19 NOTE — Progress Notes (Signed)
Specialty Pharmacy Ongoing Clinical Assessment Note  [name] is a 89 y.o. male who is being followed by the specialty pharmacy service for RxSp Oncology   Patient's specialty medication(s) reviewed today: Abiraterone Acetate (ZYTIGA)   Missed doses in the last 4 weeks: 0   Patient/Caregiver did not have any additional questions or concerns.   Therapeutic benefit summary: Unable to assess   Adverse events/side effects summary: No adverse events/side effects   Patient's therapy is appropriate to: Continue    Goals Addressed             This Visit's Progress    Stabilization of disease       Patient is on track. Patient will maintain adherence         Follow up:  6 months  Bobette Mo Specialty Pharmacist

## 2023-04-19 NOTE — Progress Notes (Signed)
Specialty Pharmacy Refill Coordination Note  Alexander Rich is a 89 y.o. male contacted today regarding refills of specialty medication(s) Abiraterone Acetate [name])   Patient requested Delivery   Delivery date: 04/22/23   Verified address: [address]   Medication will be filled on 04/21/23. Pending Refill Request.

## 2023-04-20 ENCOUNTER — Other Ambulatory Visit (HOSPITAL_COMMUNITY): Payer: Self-pay

## 2023-04-21 ENCOUNTER — Encounter: Payer: Self-pay | Admitting: Hematology

## 2023-04-21 ENCOUNTER — Other Ambulatory Visit: Payer: Self-pay

## 2023-05-03 ENCOUNTER — Other Ambulatory Visit: Payer: Self-pay | Admitting: Nurse Practitioner

## 2023-05-03 ENCOUNTER — Telehealth: Payer: Self-pay

## 2023-05-03 MED ORDER — OXYCODONE HCL 5 MG PO TABS: 5.0000 mg | ORAL_TABLET | Freq: Four times a day (QID) | ORAL | 0 refills | Status: DC | PRN

## 2023-05-03 NOTE — Telephone Encounter (Signed)
I have sent this to [facility] at [address] [place]

## 2023-05-03 NOTE — Telephone Encounter (Signed)
Pt LVM requesting a refill on his Oxycodone.  Notified Dr. Mosetta Putt and her Team of the pt's request.

## 2023-05-11 ENCOUNTER — Other Ambulatory Visit (HOSPITAL_COMMUNITY): Payer: Self-pay | Admitting: Pharmacy Technician

## 2023-05-11 ENCOUNTER — Other Ambulatory Visit (HOSPITAL_COMMUNITY): Payer: Self-pay

## 2023-05-11 ENCOUNTER — Encounter: Payer: Self-pay | Admitting: Hematology

## 2023-05-11 DIAGNOSIS — D0439 Carcinoma in situ of skin of other parts of face: Secondary | ICD-10-CM | POA: Diagnosis not present

## 2023-05-11 DIAGNOSIS — L0100 Impetigo, unspecified: Secondary | ICD-10-CM | POA: Diagnosis not present

## 2023-05-11 DIAGNOSIS — L814 Other melanin hyperpigmentation: Secondary | ICD-10-CM | POA: Diagnosis not present

## 2023-05-11 DIAGNOSIS — L821 Other seborrheic keratosis: Secondary | ICD-10-CM | POA: Diagnosis not present

## 2023-05-11 DIAGNOSIS — C44319 Basal cell carcinoma of skin of other parts of face: Secondary | ICD-10-CM | POA: Diagnosis not present

## 2023-05-11 DIAGNOSIS — L820 Inflamed seborrheic keratosis: Secondary | ICD-10-CM | POA: Diagnosis not present

## 2023-05-11 NOTE — Assessment & Plan Note (Signed)
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  I explained the logistics to him in detail, however patient is strongly against radiation therapy, he does not want to prolong his life, his goal of therapy is to improve his quality of life.  Also I think radiation will improve his pain control, he still declined RT --His leg edema has improved since he started ADT however his pain is still significant, I switched Casodex to Zytiga and prednisone, for better disease control and symptom relief. He started in June 2024. He responded well, both pain and leg edema has improved. He is tolerating well  -Due to his advanced age, limited social support, if he progressed on ADT, I would recommend hospice care.  He is not a candidate for chemotherapy

## 2023-05-11 NOTE — Progress Notes (Signed)
 Specialty Pharmacy Refill Coordination Note  Alexander Rich is a 89 y.o. male contacted today regarding refills of specialty medication(s) Abiraterone  Acetate (ZYTIGA )   Patient requested Delivery   Delivery date: 05/21/23   Verified address: Patient address [address]  [place]   Medication will be filled on 05/20/23.

## 2023-05-12 ENCOUNTER — Inpatient Hospital Stay: Payer: Medicare Other | Attending: Hematology

## 2023-05-12 ENCOUNTER — Inpatient Hospital Stay: Payer: Medicare Other

## 2023-05-12 ENCOUNTER — Encounter: Payer: Self-pay | Admitting: Hematology

## 2023-05-12 ENCOUNTER — Inpatient Hospital Stay: Payer: Medicare Other | Admitting: Hematology

## 2023-05-12 VITALS — BP 143/74 | HR 84 | Temp 98.4°F | Resp 19 | Wt 175.4 lb

## 2023-05-12 DIAGNOSIS — E538 Deficiency of other specified B group vitamins: Secondary | ICD-10-CM | POA: Diagnosis not present

## 2023-05-12 DIAGNOSIS — C7951 Secondary malignant neoplasm of bone: Secondary | ICD-10-CM | POA: Diagnosis not present

## 2023-05-12 DIAGNOSIS — C61 Malignant neoplasm of prostate: Secondary | ICD-10-CM

## 2023-05-12 DIAGNOSIS — Z5111 Encounter for antineoplastic chemotherapy: Secondary | ICD-10-CM | POA: Insufficient documentation

## 2023-05-12 DIAGNOSIS — C775 Secondary and unspecified malignant neoplasm of intrapelvic lymph nodes: Secondary | ICD-10-CM | POA: Diagnosis not present

## 2023-05-12 LAB — CBC WITH DIFFERENTIAL (CANCER CENTER ONLY)
Abs Immature Granulocytes: 0.03 10*3/uL (ref 0.00–0.07)
Basophils Absolute: 0 10*3/uL (ref 0.0–0.1)
Basophils Relative: 0 %
Eosinophils Absolute: 0 10*3/uL (ref 0.0–0.5)
Eosinophils Relative: 1 %
HCT: 35.9 % — ABNORMAL LOW (ref 39.0–52.0)
Hemoglobin: 12.3 g/dL — ABNORMAL LOW (ref 13.0–17.0)
Immature Granulocytes: 0 %
Lymphocytes Relative: 18 %
Lymphs Abs: 1.6 10*3/uL (ref 0.7–4.0)
MCH: 35.1 pg — ABNORMAL HIGH (ref 26.0–34.0)
MCHC: 34.3 g/dL (ref 30.0–36.0)
MCV: 102.6 fL — ABNORMAL HIGH (ref 80.0–100.0)
Monocytes Absolute: 0.5 10*3/uL (ref 0.1–1.0)
Monocytes Relative: 6 %
Neutro Abs: 6.5 10*3/uL (ref 1.7–7.7)
Neutrophils Relative %: 75 %
Platelet Count: 127 10*3/uL — ABNORMAL LOW (ref 150–400)
RBC: 3.5 MIL/uL — ABNORMAL LOW (ref 4.22–5.81)
RDW: 13.3 % (ref 11.5–15.5)
WBC Count: 8.6 10*3/uL (ref 4.0–10.5)
nRBC: 0 % (ref 0.0–0.2)

## 2023-05-12 LAB — CMP (CANCER CENTER ONLY)
ALT: 10 U/L (ref 0–44)
AST: 21 U/L (ref 15–41)
Albumin: 3.6 g/dL (ref 3.5–5.0)
Alkaline Phosphatase: 38 U/L (ref 38–126)
Anion gap: 5 (ref 5–15)
BUN: 15 mg/dL (ref 8–23)
CO2: 26 mmol/L (ref 22–32)
Calcium: 8.7 mg/dL — ABNORMAL LOW (ref 8.9–10.3)
Chloride: 109 mmol/L (ref 98–111)
Creatinine: 0.93 mg/dL (ref 0.61–1.24)
GFR, Estimated: >60 mL/min (ref >60)
Glucose, Bld: 104 mg/dL — ABNORMAL HIGH (ref 70–99)
Potassium: 4.1 mmol/L (ref 3.5–5.1)
Sodium: 140 mmol/L (ref 135–145)
Total Bilirubin: 0.8 mg/dL (ref 0.0–1.2)
Total Protein: 6.4 g/dL — ABNORMAL LOW (ref 6.5–8.1)

## 2023-05-12 MED ORDER — LEUPROLIDE ACETATE (4 MONTH) 30 MG ~~LOC~~ KIT
30.0000 mg | PACK | Freq: Once | SUBCUTANEOUS | Status: AC
Start: 2023-05-12 — End: 2023-05-12
  Administered 2023-05-12: 30 mg via SUBCUTANEOUS
  Filled 2023-05-12: qty 30

## 2023-05-12 MED ORDER — CYANOCOBALAMIN 1000 MCG/ML IJ SOLN
1000.0000 ug | Freq: Once | INTRAMUSCULAR | Status: AC
  Administered 2023-05-12: 1000 ug via INTRAMUSCULAR
  Filled 2023-05-12: qty 1

## 2023-05-12 MED ORDER — DENOSUMAB 120 MG/1.7ML ~~LOC~~ SOLN
120.0000 mg | Freq: Once | SUBCUTANEOUS | Status: AC
  Administered 2023-05-12: 120 mg via SUBCUTANEOUS
  Filled 2023-05-12: qty 1.7

## 2023-05-12 NOTE — Progress Notes (Signed)
 [facility]   [phone] [phone]   Clinic Follow up Note   Patient Care Team: Seabron Lenis, MD as PCP - General (Family Medicine) Blanca Elsie RAMAN, MD as Consulting Physician (Cardiology) Seabron Lenis, MD as Attending Physician (Family Medicine) Vertell Pont, RN as Oncology Nurse Navigator Lanny Callander, MD as Consulting Physician (Hematology and Oncology)  Date of Service:  05/12/2023  CHIEF COMPLAINT: f/u of metastatic prostate cancer  CURRENT THERAPY:  Eligard  every 4 months, Zytiga  1000 mg daily and prednisone  5 mg daily Xgeva  every 2 months  Oncology History   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  I explained the logistics to him in detail, however patient is strongly against radiation therapy, he does not want to prolong his life, his goal of therapy is to improve his quality of life.  Also I think radiation will improve his pain control, he still declined RT --His leg edema has improved since he started ADT however his pain is still significant, I switched Casodex  to Zytiga  and prednisone , for better disease control and symptom relief. He started in June 2024. He responded well, both pain and leg edema has improved. He is tolerating well  -Due to his advanced age, limited social support, if he progressed on ADT, I would recommend hospice care.  He is not a candidate for chemotherapy   Assessment and Plan    Metastatic Prostate Cancer [age]-year-old with metastatic prostate cancer, well-managed on Zytiga  and low-dose prednisone . PSA levels undetectable, no new pain, hemoglobin  12.3, platelets 127, normal kidney and liver function. Emphasized the importance of continuing current medications and injections, monitoring PSA levels and blood counts, and reporting any increase in pain. - Continue Zytiga  and low-dose prednisone  - Administer bone injection every two months - Administer prostate cancer injection every four months - Monitor PSA levels - Monitor blood counts - Report any increase in pain  Pain Management Effective pain management with oxycodone , taken daily or as needed. No new pain. Discussed the importance of using oxycodone  only as needed to avoid dependency. - Continue current oxycodone  regimen as needed for pain  General Health Maintenance Overall well-being reported, no cardiac or gastrointestinal issues. Currently taking B12 and B complex vitamins. Discussed monitoring B12 levels for adequate supplementation. - Monitor B12 levels  Plan -Lab reviewed, will proceed Xgeva  injection today and continue every 2 months, Eligard  injection today and every 4 months -Follow-up in 2 months         SUMMARY OF ONCOLOGIC HISTORY: Oncology History Overview Note   Cancer Staging  Prostate cancer metastatic to bone [facility]) Staging form: Prostate, AJCC 8th Edition - Clinical: Stage IVB (cT3, cN1, pM1c, PSA: 587) - Signed by Timmy Maude SAUNDERS, MD on 07/17/2022 Prostate specific antigen (PSA) range: 20 or greater     Prostate cancer metastatic to bone (HCC)  07/17/2022 Initial Diagnosis   Prostate cancer metastatic to bone (HCC)   07/17/2022 Cancer Staging   Staging form: Prostate, AJCC 8th Edition - Clinical: Stage IVB (cT3, cN1, pM1c, PSA: 587) - Signed by Timmy Maude SAUNDERS, MD on 07/17/2022 Prostate specific antigen (PSA) range: 20 or greater  07/17/2022 Imaging    IMPRESSION: 1. Signs of prostate cancer with nodal and bone metastasis. 2. L3 metastatic lesion with extensive soft tissue extending into a narrowed central canal. Correlate with any acute  symptoms of cord compression and consider MRI for further assessment as warranted. Superior endplate deformity at L3 likely a combination of Schmorl's node and pathologic fracture. Potential for early pathologic fracture at T1 as well. 3. Soft tissue along neurovascular structures in the LEFT pelvis raising the question of sciatic nerve involvement from tumor extension in this area this patient with LEFT-sided hip pain. 4. Bulky nodal disease in the pelvis likely explains lower extremity edema. 5. Bladder wall thickening which is largely circumferential likely related to bladder outlet obstruction with potential small bladder calculus. 6. Eccentric thickening of the sigmoid in the setting of diverticular changes is nonspecific. Correlation with Cologuard testing could be considered to determine whether further evaluation may be warranted.      Discussed the use of AI scribe software for clinical note transcription with the patient, who gave verbal consent to proceed.  History of Present Illness   The patient, a 89 year old gentleman with metastatic prostate cancer, presents for a routine follow-up. He reports no new symptoms or changes in his condition. He recently visited a dermatologist who took samples and prescribed antibiotics due to a large area of concern on his skin. He is scheduled for a follow-up with the dermatologist in two weeks. The patient reports managing his pain effectively with oxycodone , taking one pill in the morning and another as needed if he starts hurting again. He is also on Zytiga  and receives regular injections for his prostate cancer. He reports feeling good internally and has no heart or stomach trouble.         All other systems were reviewed with the patient and are negative.  MEDICAL HISTORY:  Past Medical History:  Diagnosis Date   Constipation    Hip pain    Hyperpigmented skin lesions 03/21/2022   Hypertension    Prostate cancer metastatic to  bone (HCC) 07/17/2022    SURGICAL HISTORY: Past Surgical History:  Procedure Laterality Date   BACK SURGERY     ESOPHAGOGASTRODUODENOSCOPY N/A 03/24/2022   Procedure: ESOPHAGOGASTRODUODENOSCOPY (EGD);  Surgeon: Burnette Fallow, MD;  Location: [facility];  Service: Gastroenterology;  Laterality: N/A;    I have reviewed the social history and family history with the patient and they are unchanged from previous note.  ALLERGIES:  is allergic to ibuprofen, nebivolol hcl, pravastatin, sertraline hcl, simvastatin, and trazodone.  MEDICATIONS:  Current Outpatient Medications  Medication Sig Dispense Refill   abiraterone  acetate (ZYTIGA ) 250 MG tablet Take 4 tablets (1,000 mg total) by mouth daily on an empty stomach 1 hour before meals OR 2 hours after a meal 120 tablet 2   acetaminophen  (TYLENOL ) 325 MG tablet Take 1 tablet (325 mg total) by mouth every 6 (six) hours as needed for mild pain (or Fever >/= 101).     ASCORBIC ACID PO Take 1 tablet by mouth in the morning. Vitamin C, unknown strength     cyanocobalamin  1000 MCG tablet Take 1 tablet (1,000 mcg total) by mouth daily.     furosemide  (LASIX ) 20 MG tablet Take 1 tablet (20 mg total) by mouth daily as needed for fluid or edema. (Patient taking differently: Take 20 mg by mouth daily.)     linaclotide  (LINZESS ) 145 MCG CAPS capsule Take 145 mcg by mouth daily as needed (constipation).  losartan  (COZAAR ) 100 MG tablet Take 1 tablet (100 mg total) by mouth in the morning.     oxyCODONE  (OXY IR/ROXICODONE ) 5 MG immediate release tablet Take 1-2 tablets (5-10 mg total) by mouth every 6 (six) hours as needed for severe pain (pain score 7-10). 60 tablet 0   Polyethyl Glycol-Propyl Glycol (SYSTANE OP) Place 1 drop into both eyes 4 (four) times daily as needed (dry eyes).     polyethylene glycol (MIRALAX  / GLYCOLAX ) 17 g packet Take 17 g by mouth daily as needed. Hold for diarrhea. Take when taking percocet 30 each 0   predniSONE  (DELTASONE )  5 MG tablet Take 1 tablet (5 mg total) by mouth daily with breakfast. 90 tablet 1   tamsulosin  (FLOMAX ) 0.4 MG CAPS capsule Take 0.4 mg by mouth in the morning.     VITAMIN D PO Take 1 tablet by mouth daily.     No current facility-administered medications for this visit.    PHYSICAL EXAMINATION: ECOG PERFORMANCE STATUS: 1 - Symptomatic but completely ambulatory  Vitals:   05/12/23 1332 05/12/23 1336  BP: (!) 162/78 (!) 143/74  Pulse: 84   Resp: 19   Temp: 98.4 F (36.9 C)   SpO2: 99%    Wt Readings from Last 3 Encounters:  05/12/23 175 lb 6.4 oz (79.6 kg)  04/05/23 170 lb (77.1 kg)  03/10/23 169 lb 2 oz (76.7 kg)     GENERAL:alert, no distress and comfortable SKIN: skin color, texture, turgor are normal, no rashes or significant lesions EYES: normal, Conjunctiva are pink and non-injected, sclera clear NECK: supple, thyroid  normal size, non-tender, without nodularity LYMPH:  no palpable lymphadenopathy in the cervical, axillary  LUNGS: clear to auscultation and percussion with normal breathing effort HEART: regular rate & rhythm and no murmurs and no lower extremity edema ABDOMEN:abdomen soft, non-tender and normal bowel sounds Musculoskeletal:no cyanosis of digits and no clubbing  NEURO: alert & oriented x 3 with fluent speech, no focal motor/sensory deficits        LABORATORY DATA:  I have reviewed the data as listed    Latest Ref Rng & Units 05/12/2023   12:41 PM 03/10/2023    1:30 PM 01/13/2023    1:28 PM  CBC  WBC 4.0 - 10.5 K/uL 8.6  7.8  7.9   Hemoglobin 13.0 - 17.0 g/dL 87.6  87.6  87.1   Hematocrit 39.0 - 52.0 % 35.9  37.5  39.1   Platelets 150 - 400 K/uL 127  137  146         Latest Ref Rng & Units 05/12/2023   12:41 PM 03/10/2023    1:30 PM 01/13/2023    1:28 PM  CMP  Glucose 70 - 99 mg/dL 895  868  883   BUN 8 - 23 mg/dL 15  18  16    Creatinine 0.61 - 1.24 mg/dL 9.06  9.06  9.14   Sodium 135 - 145 mmol/L 140  139  138   Potassium 3.5 - 5.1 mmol/L  4.1  4.4  4.2   Chloride 98 - 111 mmol/L 109  109  109   CO2 22 - 32 mmol/L 26  27  26    Calcium 8.9 - 10.3 mg/dL 8.7  9.0  8.9   Total Protein 6.5 - 8.1 g/dL 6.4  6.2  6.5   Total Bilirubin 0.0 - 1.2 mg/dL 0.8  0.7  0.8   Alkaline Phos 38 - 126 U/L 38  30  38  AST 15 - 41 U/L 21  20  22    ALT 0 - 44 U/L 10  11  13        RADIOGRAPHIC STUDIES: I have personally reviewed the radiological images as listed and agreed with the findings in the report. No results found.    No orders of the defined types were placed in this encounter.  All questions were answered. The patient knows to call the clinic with any problems, questions or concerns. No barriers to learning was detected. The total time spent in the appointment was 20 minutes.     Onita Mattock, MD 05/12/2023

## 2023-05-13 LAB — PROSTATE-SPECIFIC AG, SERUM (LABCORP): Prostate Specific Ag, Serum: <0.1 ng/mL (ref 0.0–4.0)

## 2023-05-17 ENCOUNTER — Telehealth: Payer: Self-pay | Admitting: Hematology

## 2023-05-17 NOTE — Telephone Encounter (Signed)
 Patient is aware of scheduled appointment times/dates

## 2023-05-20 ENCOUNTER — Encounter: Payer: Self-pay | Admitting: Hematology

## 2023-05-20 ENCOUNTER — Other Ambulatory Visit: Payer: Self-pay

## 2023-05-25 DIAGNOSIS — D0439 Carcinoma in situ of skin of other parts of face: Secondary | ICD-10-CM | POA: Diagnosis not present

## 2023-06-04 ENCOUNTER — Other Ambulatory Visit: Payer: Self-pay | Admitting: Hematology

## 2023-06-04 ENCOUNTER — Other Ambulatory Visit: Payer: Self-pay

## 2023-06-04 MED ORDER — PREDNISONE 5 MG PO TABS: 5.0000 mg | ORAL_TABLET | Freq: Every day | ORAL | 1 refills | Status: DC

## 2023-06-04 MED ORDER — OXYCODONE HCL 5 MG PO TABS: 5.0000 mg | ORAL_TABLET | Freq: Four times a day (QID) | ORAL | 0 refills | Status: DC | PRN

## 2023-06-09 ENCOUNTER — Telehealth: Payer: Self-pay | Admitting: Nurse Practitioner

## 2023-06-09 NOTE — Telephone Encounter (Signed)
 Rescheduled appointments per provider request. Patient is aware of the made appointments and will be mailed an appointment reminder.

## 2023-06-10 ENCOUNTER — Other Ambulatory Visit: Payer: Self-pay

## 2023-06-10 ENCOUNTER — Other Ambulatory Visit (HOSPITAL_COMMUNITY): Payer: Self-pay

## 2023-06-10 NOTE — Progress Notes (Signed)
 Specialty Pharmacy Refill Coordination Note  Alexander Rich is a 89 y.o. male contacted today regarding refills of specialty medication(s) Abiraterone  Acetate (ZYTIGA )   Patient requested Delivery   Delivery date: 06/15/23   Verified address: [address]   [name] [phone]   Medication will be filled on 06/14/23.

## 2023-06-11 DIAGNOSIS — R2689 Other abnormalities of gait and mobility: Secondary | ICD-10-CM | POA: Diagnosis not present

## 2023-06-11 DIAGNOSIS — M179 Osteoarthritis of knee, unspecified: Secondary | ICD-10-CM | POA: Diagnosis not present

## 2023-06-14 ENCOUNTER — Other Ambulatory Visit: Payer: Self-pay

## 2023-06-14 NOTE — Progress Notes (Signed)
 Sw patient to inform of delivery delay, ship 2/11

## 2023-07-01 ENCOUNTER — Other Ambulatory Visit: Payer: Self-pay | Admitting: Nurse Practitioner

## 2023-07-01 ENCOUNTER — Telehealth: Payer: Self-pay

## 2023-07-01 MED ORDER — OXYCODONE HCL 5 MG PO TABS
5.0000 mg | ORAL_TABLET | Freq: Four times a day (QID) | ORAL | 0 refills | Status: DC | PRN
Start: 2023-07-01 — End: 2023-08-02

## 2023-07-01 NOTE — Telephone Encounter (Signed)
 Pt LVM requesting a refill on his Oxycodone to be sent to his preferred pharmacy.  Notified Dr. Mosetta Putt and her Team of the pt's request.

## 2023-07-02 ENCOUNTER — Other Ambulatory Visit: Payer: Self-pay

## 2023-07-04 ENCOUNTER — Other Ambulatory Visit: Payer: Self-pay | Admitting: Nurse Practitioner

## 2023-07-04 DIAGNOSIS — C61 Malignant neoplasm of prostate: Secondary | ICD-10-CM

## 2023-07-04 NOTE — Progress Notes (Unsigned)
 Patient Care Team: Tally Joe, MD as PCP - General (Family Medicine) Othella Boyer, MD as Consulting Physician (Cardiology) Tally Joe, MD as Attending Physician (Family Medicine) Cherlyn Cushing, RN as Oncology Nurse Navigator Malachy Mood, MD as Consulting Physician (Hematology and Oncology)  Clinic Day:  07/07/2023  Referring physician: Tally Joe, MD  ASSESSMENT & PLAN:   Assessment & Plan: Prostate cancer metastatic to bone [facility]) -Stage IV with node and bone metastasis, PSA 587, diagnosed in 07/2022 -He started Degarelix on 07/17/2022 in the hospital, this was changed to Eligard 30 mg every 4 months for convenience, started on 09/11/2022 -Due to the bulky disease in his L3 and pelvic lymph nodes, with significant pain and leg edema, it was recommend that he consider palliative radiation therapy. The logistics were explained to him in detail, however patient is strongly against radiation therapy, he does not want to prolong his life, his goal of therapy is to improve his quality of life.  Believe thatradiation will improve his pain control, he still declined RT --His leg edema has improved since he started ADT however his pain is still significant, thus he was switched from Casodex to Zytiga and prednisone, for better disease control and symptom relief. He started in June 2024. He responded well, both pain and leg edema has improved. He is tolerating well  -Due to his advanced age, limited social support, if he progressed on ADT,  hospice care was recommended.  He is not a candidate for chemotherapy -07/07/2023 - patient reports doing well on current therapy with very little pain or swelling of the lower extremities. Continue Xgeva every 2 months and Eligard every 4 months. At home, continue Zytiga 1000mg  and prednisone 5 mg, both daily.     Right shoulder pain Believed to be unrelated to prostate cancer and bony metastases. Patient does have orthopedic provider [facility].   Patient to call and make appointment for further evaluation  Lower leg edema Continues to be well-controlled with current medication regimen.  Plan: Labs reviewed today -CBC and CMP along with patient condition are satisfactory for treatment today. -B12 and PSA levels are pending. Proceed with Eligard, Xgeva, and B12 injections today. Continue Zytiga 1000 mg daily Continue prednisone 5 mg daily Next Xgeva injection due in 2 months Next Eligard injection due in 4 months.  The patient understands the plans discussed today and is in agreement with them.  He knows to contact our office if he develops concerns prior to his next appointment.  I provided 20 minutes of face-to-face time during this encounter and > 50% was spent counseling as documented under my assessment and plan.    Carlean Jews, NP  [facility] - A DEPT OF [facility] [address] [place] [address] Dept: [phone] Dept Fax: [phone]   No orders of the defined types were placed in this encounter.     CHIEF COMPLAINT:  CC: Metastatic prostate cancer  Current Treatment: Eligard every 4 months, Zytiga 1000 mg daily,  prednisone 5 mg daily, and Xgeva every 2 months  INTERVAL HISTORY:  Alexander Rich is here today for repeat clinical assessment.  He was last seen by Dr. Mosetta Putt on 05/12/2023.  States he is having some right shoulder pain.  This was accompanied with reduced range of motion.  Able to passively move the arm with pain but unable to actively move it.  Plans to contact orthopedic provider for further evaluation.  States that at 1 point, he was having low back pain and lower leg welling.  This is basically resolved.  Did have some constipation.  Takes MiraLAX which resolves this. He denies chest pain, chest pressure, or shortness of breath. He denies headaches or visual disturbances. He denies abdominal pain, nausea, vomiting, or changes in  bowel or bladder habits.   He denies fevers or chills. His appetite is good. His weight has been stable.  I have reviewed the past medical history, past surgical history, social history and family history with the patient and they are unchanged from previous note.  ALLERGIES:  is allergic to ibuprofen, nebivolol hcl, pravastatin, sertraline hcl, simvastatin, and trazodone.  MEDICATIONS:  Current Outpatient Medications  Medication Sig Dispense Refill   abiraterone acetate (ZYTIGA) 250 MG tablet Take 4 tablets (1,000 mg total) by mouth daily on an empty stomach 1 hour before meals OR 2 hours after a meal 120 tablet 2   acetaminophen (TYLENOL) 325 MG tablet Take 1 tablet (325 mg total) by mouth every 6 (six) hours as needed for mild pain (or Fever >/= 101).     ASCORBIC ACID PO Take 1 tablet by mouth in the morning. Vitamin C, unknown strength     cyanocobalamin 1000 MCG tablet Take 1 tablet (1,000 mcg total) by mouth daily.     linaclotide (LINZESS) 145 MCG CAPS capsule Take 145 mcg by mouth daily as needed (constipation).     losartan (COZAAR) 100 MG tablet Take 1 tablet (100 mg total) by mouth in the morning.     oxyCODONE (OXY IR/ROXICODONE) 5 MG immediate release tablet Take 1-2 tablets (5-10 mg total) by mouth every 6 (six) hours as needed for severe pain (pain score 7-10). 60 tablet 0   Polyethyl Glycol-Propyl Glycol (SYSTANE OP) Place 1 drop into both eyes 4 (four) times daily as needed (dry eyes).     polyethylene glycol (MIRALAX / GLYCOLAX) 17 g packet Take 17 g by mouth daily as needed. Hold for diarrhea. Take when taking percocet 30 each 0   predniSONE (DELTASONE) 5 MG tablet Take 1 tablet (5 mg total) by mouth daily with breakfast. 90 tablet 1   tamsulosin (FLOMAX) 0.4 MG CAPS capsule Take 0.4 mg by mouth in the morning.     VITAMIN D PO Take 1 tablet by mouth daily.     furosemide (LASIX) 20 MG tablet Take 1 tablet (20 mg total) by mouth daily as needed for fluid or edema. (Patient  taking differently: Take 20 mg by mouth daily.)     No current facility-administered medications for this visit.    HISTORY OF PRESENT ILLNESS:   Oncology History Overview Note   Cancer Staging  Prostate cancer metastatic to bone [facility]) Staging form: Prostate, AJCC 8th Edition - Clinical: Stage IVB (cT3, cN1, pM1c, PSA: 587) - Signed by Josph Macho, MD on 07/17/2022 Prostate specific antigen (PSA) range: 20 or greater     Prostate cancer metastatic to bone (HCC)  07/17/2022 Initial Diagnosis   Prostate cancer metastatic to bone (HCC)   07/17/2022 Cancer Staging   Staging form: Prostate, AJCC 8th Edition - Clinical: Stage IVB (cT3, cN1, pM1c, PSA: 587) - Signed by Josph Macho, MD on 07/17/2022 Prostate specific antigen (PSA) range: 20 or greater   07/17/2022 Imaging    IMPRESSION: 1. Signs of prostate cancer with nodal and bone metastasis. 2. L3 metastatic lesion with extensive soft tissue extending into a narrowed central canal. Correlate with any acute  symptoms of cord compression and consider MRI for further assessment as warranted. Superior endplate deformity at L3 likely a combination of Schmorl's node and pathologic fracture. Potential for early pathologic fracture at T1 as well. 3. Soft tissue along neurovascular structures in the LEFT pelvis raising the question of sciatic nerve involvement from tumor extension in this area this patient with LEFT-sided hip pain. 4. Bulky nodal disease in the pelvis likely explains lower extremity edema. 5. Bladder wall thickening which is largely circumferential likely related to bladder outlet obstruction with potential small bladder calculus. 6. Eccentric thickening of the sigmoid in the setting of diverticular changes is nonspecific. Correlation with Cologuard testing could be considered to determine whether further evaluation may be warranted.       REVIEW OF SYSTEMS:   Constitutional: Denies fevers, chills or  abnormal weight loss Eyes: Denies blurriness of vision Ears, nose, mouth, throat, and face: Denies mucositis or sore throat Respiratory: Denies cough, dyspnea or wheezes Cardiovascular: Denies palpitation, chest discomfort or lower extremity swelling Gastrointestinal:  Denies nausea, heartburn or change in bowel habits Skin: Denies abnormal skin rashes Lymphatics: Denies new lymphadenopathy or easy bruising Neurological:Denies numbness, tingling or new weaknesses Behavioral/Psych: Mood is stable, no new changes  Musculoskeletal: Reports right shoulder pain with reduced range of motion.  Having a hard time actively moving his right arm about 45 degrees. All other systems were reviewed with the patient and are negative.   VITALS:   Today's Vitals   07/07/23 1058  BP: 138/66  Pulse: 85  Resp: (!) 22  Temp: 97.6 F (36.4 C)  TempSrc: Temporal  SpO2: 96%  Weight: 175 lb (79.4 kg)  Height: 5\' 7"  (1.702 m)  PainSc: 0-No pain   Body mass index is 27.41 kg/m.   Wt Readings from Last 3 Encounters:  07/07/23 175 lb (79.4 kg)  05/12/23 175 lb 6.4 oz (79.6 kg)  04/05/23 170 lb (77.1 kg)    Body mass index is 27.41 kg/m.  Performance status (ECOG): 1 - Symptomatic but completely ambulatory  PHYSICAL EXAM:   GENERAL:alert, no distress and comfortable SKIN: skin color, texture, turgor are normal, no rashes or significant lesions EYES: normal, Conjunctiva are pink and non-injected, sclera clear OROPHARYNX:no exudate, no erythema and lips, buccal mucosa, and tongue normal  NECK: supple, thyroid normal size, non-tender, without nodularity LYMPH:  no palpable lymphadenopathy in the cervical, axillary or inguinal LUNGS: clear to auscultation and percussion with normal breathing effort HEART: regular rate & rhythm and no murmurs and no lower extremity edema ABDOMEN:abdomen soft, non-tender and normal bowel sounds Musculoskeletal:no cyanosis of digits and no clubbing.  Has right  shoulder pain with reduced active range of motion.  Denies injury.  No crepitus or bony abnormalities can be palpated during today's visit. NEURO: alert & oriented x 3 with fluent speech, no focal motor/sensory deficits  LABORATORY DATA:  I have reviewed the data as listed    Component Value Date/Time   NA 140 07/07/2023 1030   K 4.3 07/07/2023 1030   CL 110 07/07/2023 1030   CO2 25 07/07/2023 1030   GLUCOSE 132 (H) 07/07/2023 1030   BUN 21 07/07/2023 1030   CREATININE 0.94 07/07/2023 1030   CALCIUM 8.8 (L) 07/07/2023 1030   PROT 6.2 (L) 07/07/2023 1030   ALBUMIN 3.5 07/07/2023 1030   AST 22 07/07/2023 1030   ALT 12 07/07/2023 1030   ALKPHOS 34 (L) 07/07/2023 1030   BILITOT 0.7 07/07/2023 1030   GFRNONAA >60 07/07/2023 1030  GFRAA >60 09/26/2019 0929     Lab Results  Component Value Date   WBC 8.2 07/07/2023   NEUTROABS 5.9 07/07/2023   HGB 11.9 (L) 07/07/2023   HCT 37.3 (L) 07/07/2023   MCV 103.6 (H) 07/07/2023   PLT 134 (L) 07/07/2023

## 2023-07-04 NOTE — Assessment & Plan Note (Signed)
-  Stage IV with node and bone metastasis, PSA 587, diagnosed in 07/2022 -He started Degarelix on 07/17/2022 in the hospital, this was changed to Eligard 30 mg every 4 months for convenience, started on 09/11/2022 -Due to the bulky disease in his L3 and pelvic lymph nodes, with significant pain and leg edema, it was recommend that he consider palliative radiation therapy. The logistics were explained to him in detail, however patient is strongly against radiation therapy, he does not want to prolong his life, his goal of therapy is to improve his quality of life.  Believe thatradiation will improve his pain control, he still declined RT --His leg edema has improved since he started ADT however his pain is still significant, thus he was switched from Casodex to Zytiga and prednisone, for better disease control and symptom relief. He started in June 2024. He responded well, both pain and leg edema has improved. He is tolerating well  -Due to his advanced age, limited social support, if he progressed on ADT,  hospice care was recommended.  He is not a candidate for chemotherapy -07/07/2023 - patient reports doing well on current therapy with very little pain or swelling of the lower extremities. Continue Xgeva every 2 months and Eligard every 4 months. At home, continue Zytiga 1000mg  and prednisone 5 mg, both daily.

## 2023-07-05 ENCOUNTER — Other Ambulatory Visit: Payer: Self-pay | Admitting: Hematology

## 2023-07-05 ENCOUNTER — Other Ambulatory Visit: Payer: Self-pay

## 2023-07-05 ENCOUNTER — Encounter: Payer: Self-pay | Admitting: Podiatry

## 2023-07-05 ENCOUNTER — Ambulatory Visit: Payer: Medicare Other | Admitting: Podiatry

## 2023-07-05 DIAGNOSIS — M79674 Pain in right toe(s): Secondary | ICD-10-CM

## 2023-07-05 DIAGNOSIS — B351 Tinea unguium: Secondary | ICD-10-CM

## 2023-07-05 DIAGNOSIS — M79675 Pain in left toe(s): Secondary | ICD-10-CM

## 2023-07-05 MED ORDER — ABIRATERONE ACETATE 250 MG PO TABS
1000.0000 mg | ORAL_TABLET | Freq: Every day | ORAL | 2 refills | Status: DC
  Filled 2023-07-05: qty 120, 30d supply, fill #0
  Filled 2023-08-11 (×2): qty 120, 30d supply, fill #1

## 2023-07-05 NOTE — Progress Notes (Signed)
 Specialty Pharmacy Refill Coordination Note  Alexander Rich is a 89 y.o. male contacted today regarding refills of specialty medication(s) Abiraterone Acetate [name])   Patient requested Delivery   Delivery date: 07/19/23   Verified address: [address]   [place] [address]   Medication will be filled on 07/16/23.  This fill date is pending response to refill request from provider. Patient is aware and if they have not received fill by intended date they must follow up with pharmacy.

## 2023-07-06 DIAGNOSIS — L821 Other seborrheic keratosis: Secondary | ICD-10-CM | POA: Diagnosis not present

## 2023-07-06 DIAGNOSIS — D044 Carcinoma in situ of skin of scalp and neck: Secondary | ICD-10-CM | POA: Diagnosis not present

## 2023-07-06 DIAGNOSIS — Z85828 Personal history of other malignant neoplasm of skin: Secondary | ICD-10-CM | POA: Diagnosis not present

## 2023-07-06 DIAGNOSIS — Z08 Encounter for follow-up examination after completed treatment for malignant neoplasm: Secondary | ICD-10-CM | POA: Diagnosis not present

## 2023-07-07 ENCOUNTER — Ambulatory Visit: Payer: Medicare Other

## 2023-07-07 ENCOUNTER — Encounter: Payer: Self-pay | Admitting: Nurse Practitioner

## 2023-07-07 ENCOUNTER — Inpatient Hospital Stay: Payer: Medicare Other | Attending: Hematology

## 2023-07-07 ENCOUNTER — Inpatient Hospital Stay: Payer: Medicare Other | Admitting: Nurse Practitioner

## 2023-07-07 ENCOUNTER — Inpatient Hospital Stay: Payer: Medicare Other

## 2023-07-07 ENCOUNTER — Ambulatory Visit: Payer: Medicare Other | Admitting: Nurse Practitioner

## 2023-07-07 ENCOUNTER — Other Ambulatory Visit: Payer: Medicare Other

## 2023-07-07 VITALS — BP 138/66 | HR 85 | Temp 97.6°F | Resp 22 | Ht 67.0 in | Wt 175.0 lb

## 2023-07-07 VITALS — HR 97 | Temp 98.7°F

## 2023-07-07 DIAGNOSIS — E538 Deficiency of other specified B group vitamins: Secondary | ICD-10-CM | POA: Diagnosis not present

## 2023-07-07 DIAGNOSIS — C61 Malignant neoplasm of prostate: Secondary | ICD-10-CM | POA: Insufficient documentation

## 2023-07-07 DIAGNOSIS — C7951 Secondary malignant neoplasm of bone: Secondary | ICD-10-CM | POA: Diagnosis not present

## 2023-07-07 DIAGNOSIS — C775 Secondary and unspecified malignant neoplasm of intrapelvic lymph nodes: Secondary | ICD-10-CM | POA: Diagnosis not present

## 2023-07-07 LAB — CBC WITH DIFFERENTIAL (CANCER CENTER ONLY)
Abs Immature Granulocytes: 0.02 10*3/uL (ref 0.00–0.07)
Basophils Absolute: 0 10*3/uL (ref 0.0–0.1)
Basophils Relative: 0 %
Eosinophils Absolute: 0 10*3/uL (ref 0.0–0.5)
Eosinophils Relative: 0 %
HCT: 37.3 % — ABNORMAL LOW (ref 39.0–52.0)
Hemoglobin: 11.9 g/dL — ABNORMAL LOW (ref 13.0–17.0)
Immature Granulocytes: 0 %
Lymphocytes Relative: 21 %
Lymphs Abs: 1.7 10*3/uL (ref 0.7–4.0)
MCH: 33.1 pg (ref 26.0–34.0)
MCHC: 31.9 g/dL (ref 30.0–36.0)
MCV: 103.6 fL — ABNORMAL HIGH (ref 80.0–100.0)
Monocytes Absolute: 0.5 10*3/uL (ref 0.1–1.0)
Monocytes Relative: 6 %
Neutro Abs: 5.9 10*3/uL (ref 1.7–7.7)
Neutrophils Relative %: 73 %
Platelet Count: 134 10*3/uL — ABNORMAL LOW (ref 150–400)
RBC: 3.6 MIL/uL — ABNORMAL LOW (ref 4.22–5.81)
RDW: 13.7 % (ref 11.5–15.5)
WBC Count: 8.2 10*3/uL (ref 4.0–10.5)
nRBC: 0 % (ref 0.0–0.2)

## 2023-07-07 LAB — CMP (CANCER CENTER ONLY)
ALT: 12 U/L (ref 0–44)
AST: 22 U/L (ref 15–41)
Albumin: 3.5 g/dL (ref 3.5–5.0)
Alkaline Phosphatase: 34 U/L — ABNORMAL LOW (ref 38–126)
Anion gap: 5 (ref 5–15)
BUN: 21 mg/dL (ref 8–23)
CO2: 25 mmol/L (ref 22–32)
Calcium: 8.8 mg/dL — ABNORMAL LOW (ref 8.9–10.3)
Chloride: 110 mmol/L (ref 98–111)
Creatinine: 0.94 mg/dL (ref 0.61–1.24)
GFR, Estimated: >60 mL/min (ref >60)
Glucose, Bld: 132 mg/dL — ABNORMAL HIGH (ref 70–99)
Potassium: 4.3 mmol/L (ref 3.5–5.1)
Sodium: 140 mmol/L (ref 135–145)
Total Bilirubin: 0.7 mg/dL (ref 0.0–1.2)
Total Protein: 6.2 g/dL — ABNORMAL LOW (ref 6.5–8.1)

## 2023-07-07 LAB — VITAMIN B12: Vitamin B-12: 539 pg/mL (ref 180–914)

## 2023-07-07 MED ORDER — DENOSUMAB 120 MG/1.7ML ~~LOC~~ SOLN
120.0000 mg | Freq: Once | SUBCUTANEOUS | Status: AC
Start: 2023-07-07 — End: 2023-07-07
  Administered 2023-07-07: 120 mg via SUBCUTANEOUS
  Filled 2023-07-07: qty 1.7

## 2023-07-07 MED ORDER — CYANOCOBALAMIN 1000 MCG/ML IJ SOLN
1000.0000 ug | Freq: Once | INTRAMUSCULAR | Status: AC
  Administered 2023-07-07: 1000 ug via INTRAMUSCULAR
  Filled 2023-07-07: qty 1

## 2023-07-07 NOTE — Progress Notes (Signed)
 Subjective:   Patient ID: Alexander Rich, male   DOB: 89 y.o.   MRN: [id number]   HPI Patient presents with elongated nailbeds 1-5 both feet thick and brittle and hard for him to cut and become tender   ROS      Objective:  Physical Exam  Neurovascular status intact with thick yellow brittle nailbeds 1-5 both feet painful     Assessment:  Mycotic nail infection with pain 1-5 both feet     Plan:  Debridement nailbeds 1-5 both feet Neutra genic bleeding reappoint routine care

## 2023-07-08 ENCOUNTER — Telehealth: Payer: Self-pay | Admitting: Nurse Practitioner

## 2023-07-08 LAB — PSA, TOTAL AND FREE
PSA, Free Pct: UNDETERMINED %
PSA, Free: <0.02 ng/mL
Prostate Specific Ag, Serum: <0.1 ng/mL (ref 0.0–4.0)

## 2023-07-08 NOTE — Telephone Encounter (Signed)
 Patient is aware of scheduled appointment times/dates for upcoming and future appointments as per scheduling orders on 07/07/2023

## 2023-07-26 DIAGNOSIS — M5416 Radiculopathy, lumbar region: Secondary | ICD-10-CM | POA: Diagnosis not present

## 2023-07-26 DIAGNOSIS — I872 Venous insufficiency (chronic) (peripheral): Secondary | ICD-10-CM | POA: Diagnosis not present

## 2023-07-26 DIAGNOSIS — K5900 Constipation, unspecified: Secondary | ICD-10-CM | POA: Diagnosis not present

## 2023-07-26 DIAGNOSIS — I119 Hypertensive heart disease without heart failure: Secondary | ICD-10-CM | POA: Diagnosis not present

## 2023-08-02 ENCOUNTER — Telehealth: Payer: Self-pay

## 2023-08-02 ENCOUNTER — Other Ambulatory Visit: Payer: Self-pay

## 2023-08-02 ENCOUNTER — Other Ambulatory Visit: Payer: Self-pay | Admitting: Nurse Practitioner

## 2023-08-02 MED ORDER — PREDNISONE 5 MG PO TABS: 5.0000 mg | ORAL_TABLET | Freq: Every day | ORAL | 1 refills | Status: DC

## 2023-08-02 MED ORDER — OXYCODONE HCL 5 MG PO TABS
5.0000 mg | ORAL_TABLET | Freq: Four times a day (QID) | ORAL | 0 refills | Status: DC | PRN
Start: 2023-08-02 — End: 2023-09-02

## 2023-08-02 NOTE — Telephone Encounter (Signed)
 Patient called in needing to get his Oxycodone refilled and his prednisone refilled. I refilled the prednisone and sent the pain med refill to Santiago Glad NP.

## 2023-08-11 ENCOUNTER — Other Ambulatory Visit: Payer: Self-pay | Admitting: Pharmacy Technician

## 2023-08-11 ENCOUNTER — Other Ambulatory Visit: Payer: Self-pay

## 2023-08-11 NOTE — Progress Notes (Signed)
 Specialty Pharmacy Refill Coordination Note  Alexander Rich is a 89 y.o. male contacted today regarding refills of specialty medication(s) Abiraterone Acetate [name])   Patient requested Delivery   Delivery date: 08/17/23   Verified address: Patient address [address]  [place] [facility] [address]   Medication will be filled on 08/16/23.

## 2023-08-17 DIAGNOSIS — Z85828 Personal history of other malignant neoplasm of skin: Secondary | ICD-10-CM | POA: Diagnosis not present

## 2023-08-17 DIAGNOSIS — X32XXXD Exposure to sunlight, subsequent encounter: Secondary | ICD-10-CM | POA: Diagnosis not present

## 2023-08-17 DIAGNOSIS — Z08 Encounter for follow-up examination after completed treatment for malignant neoplasm: Secondary | ICD-10-CM | POA: Diagnosis not present

## 2023-08-17 DIAGNOSIS — L570 Actinic keratosis: Secondary | ICD-10-CM | POA: Diagnosis not present

## 2023-09-02 ENCOUNTER — Other Ambulatory Visit: Payer: Self-pay

## 2023-09-02 ENCOUNTER — Telehealth: Payer: Self-pay

## 2023-09-02 ENCOUNTER — Other Ambulatory Visit: Payer: Self-pay | Admitting: Hematology

## 2023-09-02 MED ORDER — OXYCODONE HCL 5 MG PO TABS
5.0000 mg | ORAL_TABLET | Freq: Four times a day (QID) | ORAL | 0 refills | Status: DC | PRN
Start: 2023-09-02 — End: 2023-10-01

## 2023-09-02 NOTE — Telephone Encounter (Signed)
 Received telephone call from the patient requesting refill on oxycodone  be sent to [facility] on [address]

## 2023-09-02 NOTE — Telephone Encounter (Signed)
 Let patient know Dr. Maryalice Smaller refilled his oxycodone  as he requested.  Patient verbalized understanding and did not have any further questions or concerns.

## 2023-09-08 ENCOUNTER — Inpatient Hospital Stay

## 2023-09-08 ENCOUNTER — Inpatient Hospital Stay: Attending: Hematology

## 2023-09-08 ENCOUNTER — Encounter: Payer: Self-pay | Admitting: Hematology

## 2023-09-08 ENCOUNTER — Inpatient Hospital Stay: Admitting: Hematology

## 2023-09-08 ENCOUNTER — Other Ambulatory Visit: Payer: Self-pay

## 2023-09-08 ENCOUNTER — Other Ambulatory Visit (HOSPITAL_COMMUNITY): Payer: Self-pay

## 2023-09-08 VITALS — BP 106/59 | HR 110 | Temp 98.3°F | Resp 21 | Ht 67.0 in | Wt 170.4 lb

## 2023-09-08 DIAGNOSIS — C61 Malignant neoplasm of prostate: Secondary | ICD-10-CM | POA: Insufficient documentation

## 2023-09-08 DIAGNOSIS — C7951 Secondary malignant neoplasm of bone: Secondary | ICD-10-CM | POA: Diagnosis not present

## 2023-09-08 DIAGNOSIS — Z5111 Encounter for antineoplastic chemotherapy: Secondary | ICD-10-CM | POA: Insufficient documentation

## 2023-09-08 DIAGNOSIS — E538 Deficiency of other specified B group vitamins: Secondary | ICD-10-CM | POA: Diagnosis not present

## 2023-09-08 DIAGNOSIS — C775 Secondary and unspecified malignant neoplasm of intrapelvic lymph nodes: Secondary | ICD-10-CM | POA: Insufficient documentation

## 2023-09-08 LAB — CMP (CANCER CENTER ONLY)
ALT: 10 U/L (ref 0–44)
AST: 21 U/L (ref 15–41)
Albumin: 3.3 g/dL — ABNORMAL LOW (ref 3.5–5.0)
Alkaline Phosphatase: 35 U/L — ABNORMAL LOW (ref 38–126)
Anion gap: 7 (ref 5–15)
BUN: 16 mg/dL (ref 8–23)
CO2: 26 mmol/L (ref 22–32)
Calcium: 8.2 mg/dL — ABNORMAL LOW (ref 8.9–10.3)
Chloride: 105 mmol/L (ref 98–111)
Creatinine: 1.29 mg/dL — ABNORMAL HIGH (ref 0.61–1.24)
GFR, Estimated: 51 mL/min — ABNORMAL LOW (ref >60)
Glucose, Bld: 123 mg/dL — ABNORMAL HIGH (ref 70–99)
Potassium: 3.8 mmol/L (ref 3.5–5.1)
Sodium: 138 mmol/L (ref 135–145)
Total Bilirubin: 0.8 mg/dL (ref 0.0–1.2)
Total Protein: 6 g/dL — ABNORMAL LOW (ref 6.5–8.1)

## 2023-09-08 LAB — CBC WITH DIFFERENTIAL (CANCER CENTER ONLY)
Abs Immature Granulocytes: 0.02 10*3/uL (ref 0.00–0.07)
Basophils Absolute: 0 10*3/uL (ref 0.0–0.1)
Basophils Relative: 0 %
Eosinophils Absolute: 0.2 10*3/uL (ref 0.0–0.5)
Eosinophils Relative: 2 %
HCT: 35.5 % — ABNORMAL LOW (ref 39.0–52.0)
Hemoglobin: 12 g/dL — ABNORMAL LOW (ref 13.0–17.0)
Immature Granulocytes: 0 %
Lymphocytes Relative: 33 %
Lymphs Abs: 2.8 10*3/uL (ref 0.7–4.0)
MCH: 33.5 pg (ref 26.0–34.0)
MCHC: 33.8 g/dL (ref 30.0–36.0)
MCV: 99.2 fL (ref 80.0–100.0)
Monocytes Absolute: 0.7 10*3/uL (ref 0.1–1.0)
Monocytes Relative: 9 %
Neutro Abs: 4.8 10*3/uL (ref 1.7–7.7)
Neutrophils Relative %: 56 %
Platelet Count: 143 10*3/uL — ABNORMAL LOW (ref 150–400)
RBC: 3.58 MIL/uL — ABNORMAL LOW (ref 4.22–5.81)
RDW: 13.3 % (ref 11.5–15.5)
WBC Count: 8.6 10*3/uL (ref 4.0–10.5)
nRBC: 0 % (ref 0.0–0.2)

## 2023-09-08 MED ORDER — ABIRATERONE ACETATE 250 MG PO TABS
1000.0000 mg | ORAL_TABLET | Freq: Every day | ORAL | 2 refills | Status: DC
  Filled 2023-09-08 – 2023-09-15 (×2): qty 120, 30d supply, fill #0
  Filled 2023-10-13: qty 120, 30d supply, fill #1
  Filled 2023-11-03 – 2023-11-09 (×2): qty 120, 30d supply, fill #2

## 2023-09-08 MED ORDER — DENOSUMAB 120 MG/1.7ML ~~LOC~~ SOLN: 120.0000 mg | Freq: Once | SUBCUTANEOUS | Status: DC

## 2023-09-08 MED ORDER — LEUPROLIDE ACETATE (4 MONTH) 30 MG ~~LOC~~ KIT
30.0000 mg | PACK | Freq: Once | SUBCUTANEOUS | Status: AC
  Administered 2023-09-08: 30 mg via SUBCUTANEOUS
  Filled 2023-09-08: qty 30

## 2023-09-08 MED ORDER — CYANOCOBALAMIN 1000 MCG/ML IJ SOLN
1000.0000 ug | Freq: Once | INTRAMUSCULAR | Status: AC
  Administered 2023-09-08: 1000 ug via INTRAMUSCULAR
  Filled 2023-09-08: qty 1

## 2023-09-08 NOTE — Progress Notes (Signed)
 [facility]   [phone] [phone]   Clinic Follow up Note   Patient Care Team: Rae Bugler, MD as PCP - General (Family Medicine) Dorsey Gault, MD as Consulting Physician (Cardiology) Rae Bugler, MD as Attending Physician (Family Medicine) Katheleen Palmer, RN as Oncology Nurse Navigator Sonja Carrollton, MD as Consulting Physician (Hematology and Oncology)  Date of Service:  09/08/2023  CHIEF COMPLAINT: f/u of metastatic prostate cancer  CURRENT THERAPY:  Xgeva  and B12 every 2 months Eligard  every 4 months Zytiga  and prednisone   Oncology History   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  I explained the logistics to him in detail, however patient is strongly against radiation therapy, he does not want to prolong his life, his goal of therapy is to improve his quality of life.  Also I think radiation will improve his pain control, he still declined RT --His leg edema has improved since he started ADT however his pain is still significant, I switched Casodex  to Zytiga  and prednisone , for better disease control and symptom relief. He started in June 2024. He responded well, both pain and leg edema has improved. He is tolerating well  -Due to his advanced age, limited social support, if he progressed on ADT, I would recommend hospice care.  He is not a candidate for chemotherapy  Assessment & Plan Metastatic prostate cancer Metastatic prostate cancer is well-controlled with current treatment. PSA levels were undetectable in March, indicating effective disease control. He is on Zytiga  and low-dose  prednisone , which he tolerates well. He inquires about the duration of medication use and is informed that treatment will continue as long as it remains effective. Alternative treatments will be considered if disease progression occurs. Chemotherapy is not planned, but other treatment options will be discussed if needed. - Continue Zytiga  and low-dose prednisone  regimen. - Administer prostate injection every four months. - Monitor PSA levels regularly. - Discuss alternative treatment options if disease progression occurs.  Chronic back pain  Chronic pain primarily in the lower back, exacerbated by physical activity. Pain is managed with oxycodone , which he takes one to two times daily, occasionally up to three times if needed. He reports effective pain relief with current dosing. - Continue oxycodone  for pain management. - Monitor pain levels and adjust oxycodone  dosage as needed.  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 He reports regular bowel movements with this regimen. - Continue daily Miralax  to manage constipation.  Plan - He is clinically stable, his low back pain is well-controlled with oxycodone . - Will proceed Eligard  and B12 injection today, hold Xgeva  today due to hypocalcemia - Lab and injection every 2 months, follow up in 2 months as scheduled    SUMMARY OF ONCOLOGIC HISTORY: Oncology History Overview Note   Cancer Staging  Prostate cancer metastatic to bone [facility]) Staging form: Prostate, AJCC 8th Edition - Clinical: Stage IVB (cT3, cN1, pM1c, PSA: 587) - Signed by Ivor Mars, MD on 07/17/2022 Prostate specific antigen (PSA) range: 20 or greater     Prostate cancer metastatic to bone (HCC)  07/17/2022 Initial Diagnosis  Prostate cancer metastatic to bone [facility])   07/17/2022 Cancer Staging   Staging form: Prostate, AJCC 8th Edition - Clinical: Stage IVB (cT3, cN1, pM1c, PSA: 587) - Signed by Ivor Mars, MD on 07/17/2022 Prostate specific antigen (PSA) range: 20 or greater   07/17/2022 Imaging    IMPRESSION: 1. Signs of prostate cancer with nodal and bone metastasis. 2. L3 metastatic lesion with extensive soft tissue extending into a narrowed central canal. Correlate with any acute symptoms of cord compression and consider MRI for further assessment as warranted. Superior endplate deformity at L3 likely a combination of Schmorl's node and pathologic fracture. Potential for early pathologic fracture at T1 as well. 3. Soft tissue along neurovascular structures in the LEFT pelvis raising the question of sciatic nerve involvement from tumor extension in this area this patient with LEFT-sided hip pain. 4. Bulky nodal disease in the pelvis likely explains lower extremity edema. 5. Bladder wall thickening which is largely circumferential likely related to bladder outlet obstruction with potential small bladder calculus. 6. Eccentric thickening of the sigmoid in the setting of diverticular changes is nonspecific. Correlation with Cologuard testing could be considered to determine whether further evaluation may be warranted.      Discussed the use of AI scribe software for clinical note transcription with the patient, who gave verbal consent to proceed.  History of Present Illness Alexander Rich is a 89 year old male with metastatic prostate cancer who presents for follow-up.  His condition remains stable with no new symptoms. He experiences lower back pain during physical activities, managed with oxycodone , taking one to three pills daily as needed. He has been on oxycodone  for six to seven years.  He takes Zytiga , four pills every morning an hour before eating, and a low dose of prednisone  daily. Miralax  is used daily to manage constipation from oxycodone . He is also on blood pressure medication.  He lives alone and manages his daily activities independently, expressing concern  about his ability to continue living alone in the future. He uses a calendar to keep track of appointments.     All other systems were reviewed with the patient and are negative.  MEDICAL HISTORY:  Past Medical History:  Diagnosis Date   Constipation    Hip pain    Hyperpigmented skin lesions 03/21/2022   Hypertension    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    SURGICAL HISTORY: Past Surgical History:  Procedure Laterality Date   BACK SURGERY     ESOPHAGOGASTRODUODENOSCOPY N/A 03/24/2022   Procedure: ESOPHAGOGASTRODUODENOSCOPY (EGD);  Surgeon: Evangeline Hilts, MD;  Location: [facility];  Service: Gastroenterology;  Laterality: N/A;    I have reviewed the social history and family history with the patient and they are unchanged from previous note.  ALLERGIES:  is allergic to ibuprofen, nebivolol hcl, pravastatin, sertraline hcl, simvastatin, and trazodone.  MEDICATIONS:  Current Outpatient Medications  Medication Sig Dispense Refill   acetaminophen  (TYLENOL ) 325 MG tablet Take 1 tablet (325 mg total) by mouth every 6 (six) hours as needed for mild pain (or Fever >/= 101).     ASCORBIC ACID PO Take 1 tablet by mouth in the morning. Vitamin C, unknown strength     cyanocobalamin  1000 MCG tablet Take 1 tablet (1,000 mcg total) by mouth daily.     linaclotide  (LINZESS ) 145 MCG CAPS capsule Take 145 mcg by mouth daily as needed (constipation).     losartan  (COZAAR ) 100 MG tablet Take 1 tablet (100 mg total)  by mouth in the morning.     oxyCODONE  (OXY IR/ROXICODONE ) 5 MG immediate release tablet Take 1-2 tablets (5-10 mg total) by mouth every 6 (six) hours as needed for severe pain (pain score 7-10). 60 tablet 0   Polyethyl Glycol-Propyl Glycol (SYSTANE OP) Place 1 drop into both eyes 4 (four) times daily as needed (dry eyes).     polyethylene glycol (MIRALAX  / GLYCOLAX ) 17 g packet Take 17 g by mouth daily as needed. Hold for diarrhea. Take when taking percocet 30 each  0   predniSONE  (DELTASONE ) 5 MG tablet Take 1 tablet (5 mg total) by mouth daily with breakfast. 90 tablet 1   tamsulosin  (FLOMAX ) 0.4 MG CAPS capsule Take 0.4 mg by mouth in the morning.     VITAMIN D PO Take 1 tablet by mouth daily.     abiraterone  acetate (ZYTIGA ) 250 MG tablet Take 4 tablets (1,000 mg total) by mouth daily on an empty stomach 1 hour before meals OR 2 hours after a meal 120 tablet 2   furosemide  (LASIX ) 20 MG tablet Take 1 tablet (20 mg total) by mouth daily as needed for fluid or edema. (Patient taking differently: Take 20 mg by mouth daily.)     No current facility-administered medications for this visit.   Facility-Administered Medications Ordered in Other Visits  Medication Dose Route Frequency Provider Last Rate Last Admin   cyanocobalamin  (VITAMIN B12) injection 1,000 mcg  1,000 mcg Intramuscular Once Sonja Sheffield, MD       denosumab  (XGEVA ) injection 120 mg  120 mg Subcutaneous Once Sonja Ewing, MD        PHYSICAL EXAMINATION: ECOG PERFORMANCE STATUS: 2 - Symptomatic, <50% confined to bed  Vitals:   09/08/23 1100  BP: (!) 106/59  Pulse: (!) 110  Resp: (!) 21  Temp: 98.3 F (36.8 C)  SpO2: 98%   Wt Readings from Last 3 Encounters:  09/08/23 170 lb 6.4 oz (77.3 kg)  07/07/23 175 lb (79.4 kg)  05/12/23 175 lb 6.4 oz (79.6 kg)     GENERAL:alert, no distress and comfortable SKIN: skin color, texture, turgor are normal, no rashes or significant lesions EYES: normal, Conjunctiva are pink and non-injected, sclera clear NECK: supple, thyroid  normal size, non-tender, without nodularity LYMPH:  no palpable lymphadenopathy in the cervical, axillary  LUNGS: clear to auscultation and percussion with normal breathing effort HEART: regular rate & rhythm and no murmurs and no lower extremity edema ABDOMEN:abdomen soft, non-tender and normal bowel sounds Musculoskeletal:no cyanosis of digits and no clubbing  NEURO: alert & oriented x 3 with fluent speech, no focal  motor/sensory deficits  Physical Exam    LABORATORY DATA:  I have reviewed the data as listed    Latest Ref Rng & Units 09/08/2023   10:46 AM 07/07/2023   10:30 AM 05/12/2023   12:41 PM  CBC  WBC 4.0 - 10.5 K/uL 8.6  8.2  8.6   Hemoglobin 13.0 - 17.0 g/dL 09.8  11.9  14.7   Hematocrit 39.0 - 52.0 % 35.5  37.3  35.9   Platelets 150 - 400 K/uL 143  134  127         Latest Ref Rng & Units 09/08/2023   10:46 AM 07/07/2023   10:30 AM 05/12/2023   12:41 PM  CMP  Glucose 70 - 99 mg/dL 829  562  130   BUN 8 - 23 mg/dL 16  21  15    Creatinine 0.61 - 1.24 mg/dL 8.65  7.84  0.93   Sodium 135 - 145 mmol/L 138  140  140   Potassium 3.5 - 5.1 mmol/L 3.8  4.3  4.1   Chloride 98 - 111 mmol/L 105  110  109   CO2 22 - 32 mmol/L 26  25  26    Calcium 8.9 - 10.3 mg/dL 8.2  8.8  8.7   Total Protein 6.5 - 8.1 g/dL 6.0  6.2  6.4   Total Bilirubin 0.0 - 1.2 mg/dL 0.8  0.7  0.8   Alkaline Phos 38 - 126 U/L 35  34  38   AST 15 - 41 U/L 21  22  21    ALT 0 - 44 U/L 10  12  10        RADIOGRAPHIC STUDIES: I have personally reviewed the radiological images as listed and agreed with the findings in the report. No results found.    No orders of the defined types were placed in this encounter.  All questions were answered. The patient knows to call the clinic with any problems, questions or concerns. No barriers to learning was detected. The total time spent in the appointment was 25 minutes.     Sonja [place], MD 09/08/2023

## 2023-09-08 NOTE — Assessment & Plan Note (Signed)
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  I explained the logistics to him in detail, however patient is strongly against radiation therapy, he does not want to prolong his life, his goal of therapy is to improve his quality of life.  Also I think radiation will improve his pain control, he still declined RT --His leg edema has improved since he started ADT however his pain is still significant, I switched Casodex to Zytiga and prednisone, for better disease control and symptom relief. He started in June 2024. He responded well, both pain and leg edema has improved. He is tolerating well  -Due to his advanced age, limited social support, if he progressed on ADT, I would recommend hospice care.  He is not a candidate for chemotherapy

## 2023-09-09 LAB — PROSTATE-SPECIFIC AG, SERUM (LABCORP): Prostate Specific Ag, Serum: <0.1 ng/mL (ref 0.0–4.0)

## 2023-09-15 ENCOUNTER — Other Ambulatory Visit: Payer: Self-pay

## 2023-09-15 NOTE — Progress Notes (Signed)
 Specialty Pharmacy Refill Coordination Note  Alexander Rich is a 89 y.o. male contacted today regarding refills of specialty medication(s) Abiraterone  Acetate (ZYTIGA )   Patient requested Delivery   Delivery date: 09/20/23   Verified address: Patient address [address]  [place] [address]   Medication will be filled on 05.16.25.

## 2023-10-01 ENCOUNTER — Other Ambulatory Visit: Payer: Self-pay | Admitting: Hematology

## 2023-10-01 MED ORDER — OXYCODONE HCL 5 MG PO TABS: 5.0000 mg | ORAL_TABLET | Freq: Four times a day (QID) | ORAL | 0 refills | Status: DC | PRN

## 2023-10-04 ENCOUNTER — Encounter: Payer: Self-pay | Admitting: Podiatry

## 2023-10-04 ENCOUNTER — Ambulatory Visit: Admitting: Podiatry

## 2023-10-04 DIAGNOSIS — M79674 Pain in right toe(s): Secondary | ICD-10-CM

## 2023-10-04 DIAGNOSIS — B351 Tinea unguium: Secondary | ICD-10-CM | POA: Diagnosis not present

## 2023-10-04 DIAGNOSIS — M79675 Pain in left toe(s): Secondary | ICD-10-CM

## 2023-10-04 DIAGNOSIS — D696 Thrombocytopenia, unspecified: Secondary | ICD-10-CM

## 2023-10-04 NOTE — Progress Notes (Signed)
 This patient returns to my office for at risk foot care.  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  This patient is unable to cut nails himself since the patient cannot reach his nails.These nails are painful walking and wearing shoes.  This patient presents for at risk foot care today.  General Appearance  Alert, conversant and in no acute stress.  Vascular  Dorsalis pedis and posterior tibial  pulses are  weakly palpable  bilaterally.  Capillary return is within normal limits  bilaterally. Temperature is within normal limits  bilaterally.  Neurologic  Senn-Weinstein monofilament wire test within normal limits  bilaterally. Muscle power within normal limits bilaterally.  Nails Thick disfigured discolored nails with subungual debris  from hallux to fifth toes bilaterally. No evidence of bacterial infection or drainage bilaterally.  Orthopedic  No limitations of motion  feet .  No crepitus or effusions noted.  No bony pathology or digital deformities noted.  Skin  normotropic skin with no porokeratosis noted bilaterally.  No signs of infections or ulcers noted.     Onychomycosis  Pain in right toes  Pain in left toes  Consent was obtained for treatment procedures.   Mechanical debridement of nails 1-5  bilaterally performed with a nail nipper.  Filed with dremel without incident.    Return office visit       3 months               Told patient to return for periodic foot care and evaluation due to potential at risk complications.   Ruffin Cotton DPM

## 2023-10-13 ENCOUNTER — Other Ambulatory Visit: Payer: Self-pay

## 2023-10-13 NOTE — Progress Notes (Signed)
 Specialty Pharmacy Refill Coordination Note  Alexander Rich is a 89 y.o. male contacted today regarding refills of specialty medication(s) Abiraterone  Acetate (ZYTIGA )   Patient requested Delivery   Delivery date: 10/15/23   Verified address: [address]  [place] [address]   Medication will be filled on 10/14/23.

## 2023-10-13 NOTE — Progress Notes (Signed)
 Specialty Pharmacy Ongoing Clinical Assessment Note  Alexander Rich is a 89 y.o. male who is being followed by the specialty pharmacy service for RxSp Oncology   Patient's specialty medication(s) reviewed today: Abiraterone  Acetate (ZYTIGA )   Missed doses in the last 4 weeks: 0   Patient/Caregiver did not have any additional questions or concerns.   Therapeutic benefit summary: Patient is achieving benefit   Adverse events/side effects summary: No adverse events/side effects   Patient's therapy is appropriate to: Continue    Goals Addressed             This Visit's Progress    Stabilization of disease   On track    Patient is on track. Patient will maintain adherence. PSA undetectable as of 09/08/23.         Follow up: 6 months  [facility]

## 2023-11-01 ENCOUNTER — Other Ambulatory Visit: Payer: Self-pay | Admitting: Nurse Practitioner

## 2023-11-01 ENCOUNTER — Telehealth: Payer: Self-pay

## 2023-11-01 MED ORDER — OXYCODONE HCL 5 MG PO TABS: 5.0000 mg | ORAL_TABLET | Freq: Four times a day (QID) | ORAL | 0 refills | Status: DC | PRN

## 2023-11-01 NOTE — Telephone Encounter (Signed)
 Pt called requesting a refill on his Oxycodone  5mg  IR.  Stated this nurse will make Dr. Lanny aware of the pt's call and request.  Notified Dr. Lanny and her Team.

## 2023-11-03 ENCOUNTER — Other Ambulatory Visit: Payer: Self-pay

## 2023-11-09 ENCOUNTER — Other Ambulatory Visit: Payer: Self-pay | Admitting: Nurse Practitioner

## 2023-11-09 ENCOUNTER — Other Ambulatory Visit: Payer: Self-pay

## 2023-11-09 DIAGNOSIS — E538 Deficiency of other specified B group vitamins: Secondary | ICD-10-CM

## 2023-11-09 DIAGNOSIS — C61 Malignant neoplasm of prostate: Secondary | ICD-10-CM

## 2023-11-09 NOTE — Progress Notes (Signed)
 Specialty Pharmacy Refill Coordination Note  Alexander Rich is a 89 y.o. male contacted today regarding refills of specialty medication(s) Abiraterone  Acetate (ZYTIGA )   Patient requested Delivery   Delivery date: 11/11/23   Verified address: [address]   [name] [address]   Medication will be filled on 11/10/23.

## 2023-11-09 NOTE — Progress Notes (Signed)
 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  I explained the logistics to him in detail, however patient is strongly against radiation therapy, he does not want to prolong his life, his goal of therapy is to improve his quality of life.  Also I think radiation will improve his pain control, he still declined RT --His leg edema has improved since he started ADT however his pain is still significant, I switched Casodex  to Zytiga  and prednisone , for better disease control and symptom relief. He started in June 2024. He responded well, both pain and leg edema has improved. He is tolerating well  -Due to his advanced age, limited social support, if he progressed on ADT, I would recommend hospice care.  He is not a candidate for chemotherapy - Patient doing well with Zytiga  and prednisone  daily.  Receiving Xgeva  every other month as indicated.  Will continue current treatment as planned.    Hypocalcemia Patient's calcium is mildly low at 8.7 today.  Will hold Xgeva .  Reassess in 1 month and administer Xgeva  as indicated.  Plan Labs reviewed. - CBC showing mild and stable anemia. -CMP unremarkable other than calcium of 8.7. - PSA is <0.1. - B12  is 590 Continue with Zytiga  and prednisone  daily. Administer B12 injection today.  Continue B12 injections every 2 months. Will hold Xgeva  today due to hypocalcemia.  Reassess in 2 months and continue Xgeva  every 2 months as indicated. Labs, follow-up, and treatment in 2 months.  The patient understands the plans discussed today and is in agreement with them.  He knows to contact our office if he develops concerns prior to his next appointment.  I provided 25 minutes of face-to-face time during this encounter and > 50% was spent counseling as documented under my assessment and plan.    Powell FORBES Lessen, NP  [facility] - A DEPT OF [facility] [address] [place] [address] Dept: [phone] Dept Fax: [phone]   No orders of the defined types were placed in this encounter.     CHIEF COMPLAINT:  CC: Prostate cancer metastatic to bone  Current Treatment:  Xgeva  and B12 every 2 months Eligard  every 4 months Zytiga  and prednisone  daily  INTERVAL HISTORY:  Alexander Rich is here today for repeat clinical assessment.  He last saw Dr. Lanny on 09/08/2023.  Patient had hypocalcemia at last visit.  Xgeva  was held.  Continues to have hypocalcemia.  He does admit to having left hip arthritis and bursitis.  He does see an orthopedic provider who prescribed pain medication for patient to take as needed.  He has no other concerns or complaints today.  He denies chest pain, chest pressure, or shortness of breath. He denies headaches  or visual disturbances. He denies abdominal pain, nausea, vomiting, or changes in bowel or bladder habits.  He denies fevers or chills. His appetite is good. His weight has been stable.  I have reviewed the past medical history, past surgical history, social history and family history with the patient and they are unchanged from previous note.  ALLERGIES:  is allergic to ibuprofen, nebivolol hcl,  pravastatin, sertraline hcl, simvastatin, and trazodone.  MEDICATIONS:  Current Outpatient Medications  Medication Sig Dispense Refill   abiraterone  acetate (ZYTIGA ) 250 MG tablet Take 4 tablets (1,000 mg total) by mouth daily on an empty stomach 1 hour before meals OR 2 hours after a meal 120 tablet 2   acetaminophen  (TYLENOL ) 325 MG tablet Take 1 tablet (325 mg total) by mouth every 6 (six) hours as needed for mild pain (or Fever >/= 101).     ASCORBIC ACID PO Take 1 tablet by mouth in the morning. Vitamin C, unknown strength     cyanocobalamin  1000 MCG tablet Take 1 tablet (1,000 mcg total) by mouth daily.     furosemide  (LASIX ) 20 MG tablet Take 1 tablet (20 mg total) by mouth daily as needed for fluid or edema. (Patient taking differently: Take 20 mg by mouth daily.)     linaclotide  (LINZESS ) 145 MCG CAPS capsule Take 145 mcg by mouth daily as needed (constipation).     losartan  (COZAAR ) 100 MG tablet Take 1 tablet (100 mg total) by mouth in the morning.     oxyCODONE  (OXY IR/ROXICODONE ) 5 MG immediate release tablet Take 1-2 tablets (5-10 mg total) by mouth every 6 (six) hours as needed for severe pain (pain score 7-10). 80 tablet 0   Polyethyl Glycol-Propyl Glycol (SYSTANE OP) Place 1 drop into both eyes 4 (four) times daily as needed (dry eyes).     polyethylene glycol (MIRALAX  / GLYCOLAX ) 17 g packet Take 17 g by mouth daily as needed. Hold for diarrhea. Take when taking percocet 30 each 0   predniSONE  (DELTASONE ) 5 MG tablet Take 1 tablet (5 mg total) by mouth daily with breakfast. 90 tablet 1   tamsulosin  (FLOMAX ) 0.4 MG CAPS capsule Take 0.4 mg by mouth in the morning.     VITAMIN D PO Take 1 tablet by mouth daily.     No current facility-administered medications for this visit.    HISTORY OF PRESENT ILLNESS:   Oncology History Overview Note   Cancer Staging  Prostate cancer metastatic to bone [facility]) Staging form: Prostate, AJCC 8th Edition - Clinical: Stage IVB (cT3, cN1,  pM1c, PSA: 587) - Signed by Timmy Maude SAUNDERS, MD on 07/17/2022 Prostate specific antigen (PSA) range: 20 or greater     Prostate cancer metastatic to bone (HCC)  07/17/2022 Initial Diagnosis   Prostate cancer metastatic to bone (HCC)   07/17/2022 Cancer Staging   Staging form: Prostate, AJCC 8th Edition - Clinical: Stage IVB (cT3, cN1, pM1c, PSA: 587) - Signed by Timmy Maude SAUNDERS, MD on 07/17/2022 Prostate specific antigen (PSA) range: 20 or greater   07/17/2022 Imaging    IMPRESSION: 1. Signs of prostate cancer with nodal and bone metastasis. 2. L3 metastatic lesion with extensive soft tissue extending into a narrowed central canal. Correlate with any acute symptoms of cord compression and consider MRI for further assessment as warranted. Superior endplate deformity at L3 likely a combination of Schmorl's node and pathologic fracture. Potential for early pathologic fracture at T1 as well. 3. Soft tissue along neurovascular structures in the LEFT pelvis  raising the question of sciatic nerve involvement from tumor extension in this area this patient with LEFT-sided hip pain. 4. Bulky nodal disease in the pelvis likely explains lower extremity edema. 5. Bladder wall thickening which is largely circumferential likely related to bladder outlet obstruction with potential small bladder calculus. 6. Eccentric thickening of the sigmoid in the setting of diverticular changes is nonspecific. Correlation with Cologuard testing could be considered to determine whether further evaluation may be warranted.       REVIEW OF SYSTEMS:   Constitutional: Denies fevers, chills or abnormal weight loss Eyes: Denies blurriness of vision Ears, nose, mouth, throat, and face: Denies mucositis or sore throat Respiratory: Denies cough, dyspnea or wheezes Cardiovascular: Denies palpitation, chest discomfort or lower extremity swelling Gastrointestinal:  Denies nausea, heartburn or change in bowel  habits Skin: Denies abnormal skin rashes Lymphatics: Denies new lymphadenopathy or easy bruising Neurological:Denies numbness, tingling or new weaknesses Behavioral/Psych: Mood is stable, no new changes  Musculoskeletal: left hip arthritis and bursitis.. All other systems were reviewed with the patient and are negative.   VITALS:   Today's Vitals   11/10/23 1012 11/10/23 1051  BP: (!) 102/58   Pulse: 84   Resp: 17   Temp: (!) 97.3 F (36.3 C)   SpO2: 93%   Weight: 167 lb 3.2 oz (75.8 kg)   PainSc:  0-No pain   Body mass index is 26.19 kg/m.   Wt Readings from Last 3 Encounters:  11/10/23 167 lb 3.2 oz (75.8 kg)  09/08/23 170 lb 6.4 oz (77.3 kg)  07/07/23 175 lb (79.4 kg)    Body mass index is 26.19 kg/m.  Performance status (ECOG): 1 - Symptomatic but completely ambulatory  PHYSICAL EXAM:   GENERAL:alert, no distress and comfortable SKIN: skin color, texture, turgor are normal, no rashes or significant lesions EYES: normal, Conjunctiva are pink and non-injected, sclera clear OROPHARYNX:no exudate, no erythema and lips, buccal mucosa, and tongue normal  NECK: supple, thyroid  normal size, non-tender, without nodularity LYMPH:  no palpable lymphadenopathy in the cervical, axillary or inguinal LUNGS: clear to auscultation and percussion with normal breathing effort HEART: regular rate & rhythm and no murmurs and no lower extremity edema ABDOMEN:abdomen soft, non-tender and normal bowel sounds Musculoskeletal:no cyanosis of digits and no clubbing  NEURO: alert & oriented x 3 with fluent speech, no focal motor/sensory deficits  LABORATORY DATA:  I have reviewed the data as listed    Component Value Date/Time   NA 139 11/10/2023 0954   K 3.7 11/10/2023 0954   CL 107 11/10/2023 0954   CO2 27 11/10/2023 0954   GLUCOSE 111 (H) 11/10/2023 0954   BUN 17 11/10/2023 0954   CREATININE 1.20 11/10/2023 0954   CALCIUM 8.7 (L) 11/10/2023 0954   PROT 6.1 (L) 11/10/2023 0954    ALBUMIN 3.3 (L) 11/10/2023 0954   AST 21 11/10/2023 0954   ALT 11 11/10/2023 0954   ALKPHOS 39 11/10/2023 0954   BILITOT 0.8 11/10/2023 0954   GFRNONAA 56 (L) 11/10/2023 0954   GFRAA >60 09/26/2019 0929    Lab Results  Component Value Date   WBC 7.9 11/10/2023   NEUTROABS 3.5 11/10/2023   HGB 12.0 (L) 11/10/2023   HCT 36.8 (L) 11/10/2023   MCV 98.9 11/10/2023   PLT 142 (L) 11/10/2023

## 2023-11-09 NOTE — Assessment & Plan Note (Addendum)
-  Stage IV with node and bone metastasis, PSA 587, diagnosed in 07/2022 -He started Degarelix  on 07/17/2022 in the hospital, I changed it to Eligard  30 mg every 4 months for convenience, started on 09/11/2022 -Due to the bulky disease in his L3 and pelvic lymph nodes, with significant pain and leg edema, I recommend him to consider palliative radiation therapy.  I explained the logistics to him in detail, however patient is strongly against radiation therapy, he does not want to prolong his life, his goal of therapy is to improve his quality of life.  Also I think radiation will improve his pain control, he still declined RT --His leg edema has improved since he started ADT however his pain is still significant, I switched Casodex  to Zytiga  and prednisone , for better disease control and symptom relief. He started in June 2024. He responded well, both pain and leg edema has improved. He is tolerating well  -Due to his advanced age, limited social support, if he progressed on ADT, I would recommend hospice care.  He is not a candidate for chemotherapy - Patient doing well with Zytiga  and prednisone  daily.  Receiving Xgeva  every other month as indicated.  Will continue current treatment as planned.

## 2023-11-10 ENCOUNTER — Inpatient Hospital Stay: Attending: Hematology

## 2023-11-10 ENCOUNTER — Inpatient Hospital Stay

## 2023-11-10 ENCOUNTER — Other Ambulatory Visit: Payer: Self-pay

## 2023-11-10 ENCOUNTER — Inpatient Hospital Stay (HOSPITAL_BASED_OUTPATIENT_CLINIC_OR_DEPARTMENT_OTHER): Admitting: Nurse Practitioner

## 2023-11-10 VITALS — BP 102/58 | HR 84 | Temp 97.3°F | Resp 17 | Wt 167.2 lb

## 2023-11-10 DIAGNOSIS — C61 Malignant neoplasm of prostate: Secondary | ICD-10-CM | POA: Diagnosis not present

## 2023-11-10 DIAGNOSIS — C775 Secondary and unspecified malignant neoplasm of intrapelvic lymph nodes: Secondary | ICD-10-CM | POA: Insufficient documentation

## 2023-11-10 DIAGNOSIS — C7951 Secondary malignant neoplasm of bone: Secondary | ICD-10-CM | POA: Insufficient documentation

## 2023-11-10 DIAGNOSIS — E538 Deficiency of other specified B group vitamins: Secondary | ICD-10-CM | POA: Diagnosis not present

## 2023-11-10 LAB — CBC WITH DIFFERENTIAL (CANCER CENTER ONLY)
Abs Immature Granulocytes: 0.02 K/uL (ref 0.00–0.07)
Basophils Absolute: 0.1 K/uL (ref 0.0–0.1)
Basophils Relative: 1 %
Eosinophils Absolute: 0.3 K/uL (ref 0.0–0.5)
Eosinophils Relative: 3 %
HCT: 36.8 % — ABNORMAL LOW (ref 39.0–52.0)
Hemoglobin: 12 g/dL — ABNORMAL LOW (ref 13.0–17.0)
Immature Granulocytes: 0 %
Lymphocytes Relative: 40 %
Lymphs Abs: 3.2 K/uL (ref 0.7–4.0)
MCH: 32.3 pg (ref 26.0–34.0)
MCHC: 32.6 g/dL (ref 30.0–36.0)
MCV: 98.9 fL (ref 80.0–100.0)
Monocytes Absolute: 0.9 K/uL (ref 0.1–1.0)
Monocytes Relative: 12 %
Neutro Abs: 3.5 K/uL (ref 1.7–7.7)
Neutrophils Relative %: 44 %
Platelet Count: 142 K/uL — ABNORMAL LOW (ref 150–400)
RBC: 3.72 MIL/uL — ABNORMAL LOW (ref 4.22–5.81)
RDW: 13.6 % (ref 11.5–15.5)
WBC Count: 7.9 K/uL (ref 4.0–10.5)
nRBC: 0 % (ref 0.0–0.2)

## 2023-11-10 LAB — CMP (CANCER CENTER ONLY)
ALT: 11 U/L (ref 0–44)
AST: 21 U/L (ref 15–41)
Albumin: 3.3 g/dL — ABNORMAL LOW (ref 3.5–5.0)
Alkaline Phosphatase: 39 U/L (ref 38–126)
Anion gap: 5 (ref 5–15)
BUN: 17 mg/dL (ref 8–23)
CO2: 27 mmol/L (ref 22–32)
Calcium: 8.7 mg/dL — ABNORMAL LOW (ref 8.9–10.3)
Chloride: 107 mmol/L (ref 98–111)
Creatinine: 1.2 mg/dL (ref 0.61–1.24)
GFR, Estimated: 56 mL/min — ABNORMAL LOW (ref >60)
Glucose, Bld: 111 mg/dL — ABNORMAL HIGH (ref 70–99)
Potassium: 3.7 mmol/L (ref 3.5–5.1)
Sodium: 139 mmol/L (ref 135–145)
Total Bilirubin: 0.8 mg/dL (ref 0.0–1.2)
Total Protein: 6.1 g/dL — ABNORMAL LOW (ref 6.5–8.1)

## 2023-11-10 LAB — VITAMIN B12: Vitamin B-12: 590 pg/mL (ref 180–914)

## 2023-11-10 MED ORDER — CYANOCOBALAMIN 1000 MCG/ML IJ SOLN
1000.0000 ug | Freq: Once | INTRAMUSCULAR | Status: AC
  Administered 2023-11-10: 1000 ug via INTRAMUSCULAR
  Filled 2023-11-10: qty 1

## 2023-11-11 LAB — PROSTATE-SPECIFIC AG, SERUM (LABCORP): Prostate Specific Ag, Serum: <0.1 ng/mL (ref 0.0–4.0)

## 2023-11-12 ENCOUNTER — Telehealth: Payer: Self-pay | Admitting: Nurse Practitioner

## 2023-11-12 NOTE — Telephone Encounter (Signed)
 Scheduled appointments per 7/9 los. Ttalked with the patient and he is aware of the made appointments.

## 2023-11-14 ENCOUNTER — Encounter: Payer: Self-pay | Admitting: Nurse Practitioner

## 2023-11-14 ENCOUNTER — Encounter: Payer: Self-pay | Admitting: Hematology

## 2023-11-30 ENCOUNTER — Other Ambulatory Visit (HOSPITAL_COMMUNITY): Payer: Self-pay

## 2023-12-02 ENCOUNTER — Other Ambulatory Visit: Payer: Self-pay | Admitting: Nurse Practitioner

## 2023-12-02 ENCOUNTER — Other Ambulatory Visit: Payer: Self-pay

## 2023-12-02 ENCOUNTER — Telehealth: Payer: Self-pay

## 2023-12-02 DIAGNOSIS — C61 Malignant neoplasm of prostate: Secondary | ICD-10-CM | POA: Diagnosis not present

## 2023-12-02 DIAGNOSIS — E782 Mixed hyperlipidemia: Secondary | ICD-10-CM | POA: Diagnosis not present

## 2023-12-02 DIAGNOSIS — I119 Hypertensive heart disease without heart failure: Secondary | ICD-10-CM | POA: Diagnosis not present

## 2023-12-02 DIAGNOSIS — F330 Major depressive disorder, recurrent, mild: Secondary | ICD-10-CM | POA: Diagnosis not present

## 2023-12-02 MED ORDER — OXYCODONE HCL 5 MG PO TABS: 5.0000 mg | ORAL_TABLET | Freq: Four times a day (QID) | ORAL | 0 refills | Status: DC | PRN

## 2023-12-02 NOTE — Telephone Encounter (Signed)
 Pt called requesting a refill on Oxycodone  5mg  IR.  Stated this nurse will make Dr. Lanny and her Team aware.  Pt had no further questions or concerns at this time.

## 2023-12-13 ENCOUNTER — Other Ambulatory Visit: Payer: Self-pay

## 2023-12-13 ENCOUNTER — Other Ambulatory Visit: Payer: Self-pay | Admitting: Hematology

## 2023-12-13 NOTE — Progress Notes (Signed)
 Specialty Pharmacy Refill Coordination Note  Alexander Rich is a 89 y.o. male contacted today regarding refills of specialty medication(s) Abiraterone  Acetate (ZYTIGA )   Patient requested Delivery   Delivery date: 12/16/23   Verified address: [address]  [place] [address]   Medication will be filled on 12/15/23, pending refill approval.

## 2023-12-14 ENCOUNTER — Other Ambulatory Visit: Payer: Self-pay

## 2023-12-14 MED ORDER — ABIRATERONE ACETATE 250 MG PO TABS
1000.0000 mg | ORAL_TABLET | Freq: Every day | ORAL | 2 refills | Status: DC
  Filled 2023-12-14: qty 120, 30d supply, fill #0
  Filled 2024-01-10: qty 120, 30d supply, fill #1
  Filled 2024-02-09: qty 120, 30d supply, fill #2

## 2023-12-31 ENCOUNTER — Other Ambulatory Visit: Payer: Self-pay | Admitting: Hematology

## 2023-12-31 ENCOUNTER — Telehealth: Payer: Self-pay

## 2023-12-31 MED ORDER — OXYCODONE HCL 5 MG PO TABS: 5.0000 mg | ORAL_TABLET | Freq: Four times a day (QID) | ORAL | 0 refills | Status: DC | PRN

## 2023-12-31 NOTE — Telephone Encounter (Signed)
 Pt called requesting a refill of his oxycodone .  Notified Dr. Lanny of the pt's request.

## 2024-01-02 DIAGNOSIS — I119 Hypertensive heart disease without heart failure: Secondary | ICD-10-CM | POA: Diagnosis not present

## 2024-01-02 DIAGNOSIS — C61 Malignant neoplasm of prostate: Secondary | ICD-10-CM | POA: Diagnosis not present

## 2024-01-02 DIAGNOSIS — E782 Mixed hyperlipidemia: Secondary | ICD-10-CM | POA: Diagnosis not present

## 2024-01-02 DIAGNOSIS — F330 Major depressive disorder, recurrent, mild: Secondary | ICD-10-CM | POA: Diagnosis not present

## 2024-01-04 ENCOUNTER — Ambulatory Visit: Admitting: Podiatry

## 2024-01-05 ENCOUNTER — Inpatient Hospital Stay: Attending: Hematology

## 2024-01-05 ENCOUNTER — Other Ambulatory Visit: Payer: Self-pay

## 2024-01-05 ENCOUNTER — Inpatient Hospital Stay

## 2024-01-05 ENCOUNTER — Inpatient Hospital Stay: Admitting: Hematology

## 2024-01-05 VITALS — BP 160/80 | HR 99 | Temp 98.2°F | Resp 16 | Ht 67.0 in | Wt 169.1 lb

## 2024-01-05 DIAGNOSIS — E538 Deficiency of other specified B group vitamins: Secondary | ICD-10-CM

## 2024-01-05 DIAGNOSIS — C61 Malignant neoplasm of prostate: Secondary | ICD-10-CM | POA: Diagnosis not present

## 2024-01-05 DIAGNOSIS — C775 Secondary and unspecified malignant neoplasm of intrapelvic lymph nodes: Secondary | ICD-10-CM | POA: Diagnosis not present

## 2024-01-05 DIAGNOSIS — D649 Anemia, unspecified: Secondary | ICD-10-CM | POA: Diagnosis not present

## 2024-01-05 DIAGNOSIS — Z5111 Encounter for antineoplastic chemotherapy: Secondary | ICD-10-CM | POA: Diagnosis present

## 2024-01-05 DIAGNOSIS — C7951 Secondary malignant neoplasm of bone: Secondary | ICD-10-CM

## 2024-01-05 LAB — CMP (CANCER CENTER ONLY)
ALT: 12 U/L (ref 0–44)
AST: 21 U/L (ref 15–41)
Albumin: 3.2 g/dL — ABNORMAL LOW (ref 3.5–5.0)
Alkaline Phosphatase: 39 U/L (ref 38–126)
Anion gap: 3 — ABNORMAL LOW (ref 5–15)
BUN: 18 mg/dL (ref 8–23)
CO2: 27 mmol/L (ref 22–32)
Calcium: 8.6 mg/dL — ABNORMAL LOW (ref 8.9–10.3)
Chloride: 111 mmol/L (ref 98–111)
Creatinine: 1.1 mg/dL (ref 0.61–1.24)
GFR, Estimated: >60 mL/min (ref >60)
Glucose, Bld: 104 mg/dL — ABNORMAL HIGH (ref 70–99)
Potassium: 4.1 mmol/L (ref 3.5–5.1)
Sodium: 141 mmol/L (ref 135–145)
Total Bilirubin: 0.6 mg/dL (ref 0.0–1.2)
Total Protein: 6.2 g/dL — ABNORMAL LOW (ref 6.5–8.1)

## 2024-01-05 LAB — CBC WITH DIFFERENTIAL (CANCER CENTER ONLY)
Abs Immature Granulocytes: 0.03 K/uL (ref 0.00–0.07)
Basophils Absolute: 0 K/uL (ref 0.0–0.1)
Basophils Relative: 1 %
Eosinophils Absolute: 0.1 K/uL (ref 0.0–0.5)
Eosinophils Relative: 1 %
HCT: 36.4 % — ABNORMAL LOW (ref 39.0–52.0)
Hemoglobin: 11.8 g/dL — ABNORMAL LOW (ref 13.0–17.0)
Immature Granulocytes: 0 %
Lymphocytes Relative: 18 %
Lymphs Abs: 1.4 K/uL (ref 0.7–4.0)
MCH: 32.5 pg (ref 26.0–34.0)
MCHC: 32.4 g/dL (ref 30.0–36.0)
MCV: 100.3 fL — ABNORMAL HIGH (ref 80.0–100.0)
Monocytes Absolute: 0.6 K/uL (ref 0.1–1.0)
Monocytes Relative: 8 %
Neutro Abs: 5.6 K/uL (ref 1.7–7.7)
Neutrophils Relative %: 72 %
Platelet Count: 143 K/uL — ABNORMAL LOW (ref 150–400)
RBC: 3.63 MIL/uL — ABNORMAL LOW (ref 4.22–5.81)
RDW: 13.6 % (ref 11.5–15.5)
WBC Count: 7.7 K/uL (ref 4.0–10.5)
nRBC: 0 % (ref 0.0–0.2)

## 2024-01-05 LAB — VITAMIN B12: Vitamin B-12: 932 pg/mL — ABNORMAL HIGH (ref 180–914)

## 2024-01-05 MED ORDER — LEUPROLIDE ACETATE (4 MONTH) 30 MG ~~LOC~~ KIT
30.0000 mg | PACK | Freq: Once | SUBCUTANEOUS | Status: AC
  Administered 2024-01-05: 30 mg via SUBCUTANEOUS
  Filled 2024-01-05: qty 30

## 2024-01-05 MED ORDER — PREDNISONE 5 MG PO TABS: 5.0000 mg | ORAL_TABLET | Freq: Every day | ORAL | 1 refills | Status: DC

## 2024-01-05 MED ORDER — CYANOCOBALAMIN 1000 MCG/ML IJ SOLN
1000.0000 ug | Freq: Once | INTRAMUSCULAR | Status: AC
  Administered 2024-01-05: 1000 ug via INTRAMUSCULAR
  Filled 2024-01-05: qty 1

## 2024-01-05 MED ORDER — DENOSUMAB 120 MG/1.7ML ~~LOC~~ SOLN
120.0000 mg | Freq: Once | SUBCUTANEOUS | Status: AC
  Administered 2024-01-05: 120 mg via SUBCUTANEOUS
  Filled 2024-01-05: qty 1.7

## 2024-01-05 NOTE — Assessment & Plan Note (Signed)
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  I explained the logistics to him in detail, however patient is strongly against radiation therapy, he does not want to prolong his life, his goal of therapy is to improve his quality of life.  Also I think radiation will improve his pain control, he still declined RT --His leg edema has improved since he started ADT however his pain is still significant, I switched Casodex to Zytiga and prednisone, for better disease control and symptom relief. He started in June 2024. He responded well, both pain and leg edema has improved. He is tolerating well  -Due to his advanced age, limited social support, if he progressed on ADT, I would recommend hospice care.  He is not a candidate for chemotherapy

## 2024-01-05 NOTE — Progress Notes (Signed)
 [facility]   Telephone:([phone]) [phone] Fax:(336) [phone]   Clinic Follow up Note   Patient Care Team: Seabron Lenis, MD as PCP - General (Family Medicine) Blanca Elsie RAMAN, MD as Consulting Physician (Cardiology) Seabron Lenis, MD as Attending Physician (Family Medicine) Vertell Pont, RN as Oncology Nurse Navigator Lanny Callander, MD as Consulting Physician (Hematology and Oncology)  Date of Service:  01/05/2024  CHIEF COMPLAINT: f/u of prostate cancer  CURRENT THERAPY:  Xgeva  and B12 every 2 months Eligard  every 4 months Zytiga  and prednisone   Oncology History   Prostate cancer metastatic to bone [facility]) -Stage IV with node and bone metastasis, PSA 587, diagnosed in 07/2022 -He started Degarelix  on 07/17/2022 in the hospital, I changed it to Eligard  30 mg every 4 months for convenience, started on 09/11/2022 -Due to the bulky disease in his L3 and pelvic lymph nodes, with significant pain and leg edema, I recommend him to consider palliative radiation therapy.  I explained the logistics to him in detail, however patient is strongly against radiation therapy, he does not want to prolong his life, his goal of therapy is to improve his quality of life.  Also I think radiation will improve his pain control, he still declined RT --His leg edema has improved since he started ADT however his pain is still significant, I switched Casodex  to Zytiga  and prednisone , for better disease control and symptom relief. He started in June 2024. He responded well, both pain and leg edema has improved. He is tolerating well  -Due to his advanced age, limited social support, if he progressed on ADT, I would recommend hospice care.  He is not a candidate for chemotherapy  Assessment & Plan Metastatic prostate cancer Metastatic prostate cancer with ongoing management. He is currently on Zytiga , Eligard , and Xgeva , with no reported issues. He receives B12 injections and reports feeling well, functioning  independently with a walking aid due to slight equilibrium issues. No new symptoms or complications reported. - Continue Zytiga , Eligard , and Xgeva  as per current regimen. - Administer B12 injection today. - Schedule next injection for November 15. - Refill prednisone  at [facility]. - Ensure oxycodone  prescription is up to date.  Anemia Very mild anemia with no significant issues. Blood counts are being monitored and are currently stable. - Monitor blood counts regularly.  Plan - Patient is clinically stable and doing well overall, will continue current treatment. - Proceed Eligard , Xgeva  and B12 injections today - Lab, follow-up, and injections in 2 weeks     SUMMARY OF ONCOLOGIC HISTORY: Oncology History Overview Note   Cancer Staging  Prostate cancer metastatic to bone [facility]) Staging form: Prostate, AJCC 8th Edition - Clinical: Stage IVB (cT3, cN1, pM1c, PSA: 587) - Signed by Timmy Maude SAUNDERS, MD on 07/17/2022 Prostate specific antigen (PSA) range: 20 or greater     Prostate cancer metastatic to bone (HCC)  07/17/2022 Initial Diagnosis   Prostate cancer metastatic to bone (HCC)   07/17/2022 Cancer Staging   Staging form: Prostate, AJCC 8th Edition - Clinical: Stage IVB (cT3, cN1, pM1c, PSA: 587) - Signed by Timmy Maude SAUNDERS, MD on 07/17/2022 Prostate specific antigen (PSA) range: 20 or greater   07/17/2022 Imaging    IMPRESSION: 1. Signs of prostate cancer with nodal and bone metastasis. 2. L3 metastatic lesion with extensive soft tissue extending into a narrowed central canal. Correlate with any acute symptoms of cord compression and consider MRI for further assessment as warranted. Superior endplate deformity at L3 likely a combination  of Schmorl's node and pathologic fracture. Potential for early pathologic fracture at T1 as well. 3. Soft tissue along neurovascular structures in the LEFT pelvis raising the question of sciatic nerve involvement from tumor extension in  this area this patient with LEFT-sided hip pain. 4. Bulky nodal disease in the pelvis likely explains lower extremity edema. 5. Bladder wall thickening which is largely circumferential likely related to bladder outlet obstruction with potential small bladder calculus. 6. Eccentric thickening of the sigmoid in the setting of diverticular changes is nonspecific. Correlation with Cologuard testing could be considered to determine whether further evaluation may be warranted.      Discussed the use of AI scribe software for clinical note transcription with the patient, who gave verbal consent to proceed.  History of Present Illness Alexander Rich is a 89 year old male with metastatic prostate cancer who presents for follow-up.  He manages his oxycodone  intake well, taking it two to three times daily without experiencing constipation. He uses a walking aid due to slight imbalance. He receives Eligard , Xgeva , and B12 injections without issues. He takes Zytiga  and prednisone  with no side effects. He has no current medication refill needs.     All other systems were reviewed with the patient and are negative.  MEDICAL HISTORY:  Past Medical History:  Diagnosis Date   Constipation    Hip pain    Hyperpigmented skin lesions 03/21/2022   Hypertension    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    SURGICAL HISTORY: Past Surgical History:  Procedure Laterality Date   BACK SURGERY     ESOPHAGOGASTRODUODENOSCOPY N/A 03/24/2022   Procedure: ESOPHAGOGASTRODUODENOSCOPY (EGD);  Surgeon: Burnette Fallow, MD;  Location: [facility];  Service: Gastroenterology;  Laterality: N/A;    I have reviewed the social history and family history with the patient and they are unchanged from previous note.  ALLERGIES:  is allergic to ibuprofen, nebivolol hcl, pravastatin, sertraline hcl, simvastatin, and trazodone.  MEDICATIONS:  Current Outpatient Medications  Medication Sig Dispense  Refill   abiraterone  acetate (ZYTIGA ) 250 MG tablet Take 4 tablets (1,000 mg total) by mouth daily on an empty stomach 1 hour before meals OR 2 hours after a meal 120 tablet 2   acetaminophen  (TYLENOL ) 325 MG tablet Take 1 tablet (325 mg total) by mouth every 6 (six) hours as needed for mild pain (or Fever >/= 101).     ASCORBIC ACID PO Take 1 tablet by mouth in the morning. Vitamin C, unknown strength     cyanocobalamin  1000 MCG tablet Take 1 tablet (1,000 mcg total) by mouth daily.     furosemide  (LASIX ) 20 MG tablet Take 1 tablet (20 mg total) by mouth daily as needed for fluid or edema. (Patient taking differently: Take 20 mg by mouth daily.)     linaclotide  (LINZESS ) 145 MCG CAPS capsule Take 145 mcg by mouth daily as needed (constipation).     losartan  (COZAAR ) 100 MG tablet Take 1 tablet (100 mg total) by mouth in the morning.     oxyCODONE  (OXY IR/ROXICODONE ) 5 MG immediate release tablet Take 1-2 tablets (5-10 mg total) by mouth every 6 (six) hours as needed for severe pain (pain score 7-10). 80 tablet 0   Polyethyl Glycol-Propyl Glycol (SYSTANE OP) Place 1 drop into both eyes 4 (four) times daily as needed (dry eyes).     polyethylene glycol (MIRALAX  / GLYCOLAX ) 17 g packet Take 17 g by mouth daily as needed. Hold for diarrhea. Take when taking  percocet 30 each 0   tamsulosin  (FLOMAX ) 0.4 MG CAPS capsule Take 0.4 mg by mouth in the morning.     VITAMIN D PO Take 1 tablet by mouth daily.     predniSONE  (DELTASONE ) 5 MG tablet Take 1 tablet (5 mg total) by mouth daily with breakfast. 90 tablet 1   No current facility-administered medications for this visit.    PHYSICAL EXAMINATION: ECOG PERFORMANCE STATUS: 2 - Symptomatic, <50% confined to bed  Vitals:   01/05/24 1350 01/05/24 1352  BP: (!) 170/80 (!) 160/80  Pulse: 99   Resp: 16   Temp: 98.2 F (36.8 C)   SpO2: 96%    Wt Readings from Last 3 Encounters:  01/05/24 169 lb 1.6 oz (76.7 kg)  11/10/23 167 lb 3.2 oz (75.8 kg)   09/08/23 170 lb 6.4 oz (77.3 kg)     GENERAL:alert, no distress and comfortable SKIN: skin color, texture, turgor are normal, no rashes or significant lesions EYES: normal, Conjunctiva are pink and non-injected, sclera clear NECK: supple, thyroid  normal size, non-tender, without nodularity LYMPH:  no palpable lymphadenopathy in the cervical, axillary  LUNGS: clear to auscultation and percussion with normal breathing effort HEART: regular rate & rhythm and no murmurs and no lower extremity edema ABDOMEN:abdomen soft, non-tender and normal bowel sounds Musculoskeletal:no cyanosis of digits and no clubbing  NEURO: alert & oriented x 3 with fluent speech, no focal motor/sensory deficits  Physical Exam    LABORATORY DATA:  I have reviewed the data as listed    Latest Ref Rng & Units 01/05/2024    1:40 PM 11/10/2023    9:54 AM 09/08/2023   10:46 AM  CBC  WBC 4.0 - 10.5 K/uL 7.7  7.9  8.6   Hemoglobin 13.0 - 17.0 g/dL 88.1  87.9  87.9   Hematocrit 39.0 - 52.0 % 36.4  36.8  35.5   Platelets 150 - 400 K/uL 143  142  143         Latest Ref Rng & Units 01/05/2024    1:40 PM 11/10/2023    9:54 AM 09/08/2023   10:46 AM  CMP  Glucose 70 - 99 mg/dL 895  888  876   BUN 8 - 23 mg/dL 18  17  16    Creatinine 0.61 - 1.24 mg/dL 8.89  8.79  8.70   Sodium 135 - 145 mmol/L 141  139  138   Potassium 3.5 - 5.1 mmol/L 4.1  3.7  3.8   Chloride 98 - 111 mmol/L 111  107  105   CO2 22 - 32 mmol/L 27  27  26    Calcium 8.9 - 10.3 mg/dL 8.6  8.7  8.2   Total Protein 6.5 - 8.1 g/dL 6.2  6.1  6.0   Total Bilirubin 0.0 - 1.2 mg/dL 0.6  0.8  0.8   Alkaline Phos 38 - 126 U/L 39  39  35   AST 15 - 41 U/L 21  21  21    ALT 0 - 44 U/L 12  11  10        RADIOGRAPHIC STUDIES: I have personally reviewed the radiological images as listed and agreed with the findings in the report. No results found.    No orders of the defined types were placed in this encounter.  All questions were answered. The patient knows  to call the clinic with any problems, questions or concerns. No barriers to learning was detected. The total time spent in the appointment was  25 minutes, including review of chart and various tests results, discussions about plan of care and coordination of care plan     Onita Mattock, MD 01/05/2024

## 2024-01-10 ENCOUNTER — Other Ambulatory Visit: Payer: Self-pay

## 2024-01-10 NOTE — Progress Notes (Signed)
 Specialty Pharmacy Refill Coordination Note  Alexander Rich is a 89 y.o. male contacted today regarding refills of specialty medication(s) Abiraterone  Acetate (ZYTIGA )   Patient requested Delivery   Delivery date: 01/18/24   Verified address: [address]  [place] [address]   Medication will be filled on 09.15.25.

## 2024-01-12 ENCOUNTER — Ambulatory Visit: Admitting: Hematology

## 2024-01-12 ENCOUNTER — Other Ambulatory Visit

## 2024-01-12 ENCOUNTER — Ambulatory Visit

## 2024-01-17 ENCOUNTER — Other Ambulatory Visit: Payer: Self-pay

## 2024-02-01 ENCOUNTER — Other Ambulatory Visit: Payer: Self-pay | Admitting: Nurse Practitioner

## 2024-02-01 ENCOUNTER — Telehealth: Payer: Self-pay

## 2024-02-01 DIAGNOSIS — E782 Mixed hyperlipidemia: Secondary | ICD-10-CM | POA: Diagnosis not present

## 2024-02-01 DIAGNOSIS — I119 Hypertensive heart disease without heart failure: Secondary | ICD-10-CM | POA: Diagnosis not present

## 2024-02-01 DIAGNOSIS — C61 Malignant neoplasm of prostate: Secondary | ICD-10-CM | POA: Diagnosis not present

## 2024-02-01 DIAGNOSIS — F330 Major depressive disorder, recurrent, mild: Secondary | ICD-10-CM | POA: Diagnosis not present

## 2024-02-01 MED ORDER — OXYCODONE HCL 5 MG PO TABS: 5.0000 mg | ORAL_TABLET | Freq: Four times a day (QID) | ORAL | 0 refills | Status: DC | PRN

## 2024-02-01 NOTE — Telephone Encounter (Signed)
 Pt called requesting a refill on his pain medication Oxycodone .  Stated this nurse will notify Dr. Lanny and her Team of the pt's request.

## 2024-02-09 ENCOUNTER — Other Ambulatory Visit: Payer: Self-pay | Admitting: Pharmacy Technician

## 2024-02-09 ENCOUNTER — Other Ambulatory Visit: Payer: Self-pay

## 2024-02-09 NOTE — Progress Notes (Signed)
 Specialty Pharmacy Refill Coordination Note  Alexander Rich is a 89 y.o. male contacted today regarding refills of specialty medication(s) Abiraterone  Acetate (ZYTIGA )   Patient requested Delivery   Delivery date: 02/16/24   Verified address: [address] [place]   Medication will be filled on 02/15/24.

## 2024-02-15 ENCOUNTER — Other Ambulatory Visit: Payer: Self-pay

## 2024-03-03 ENCOUNTER — Other Ambulatory Visit: Payer: Self-pay | Admitting: Nurse Practitioner

## 2024-03-03 MED ORDER — OXYCODONE HCL 5 MG PO TABS: 5.0000 mg | ORAL_TABLET | Freq: Four times a day (QID) | ORAL | 0 refills | Status: AC | PRN

## 2024-03-07 ENCOUNTER — Other Ambulatory Visit: Payer: Self-pay

## 2024-03-07 DIAGNOSIS — E538 Deficiency of other specified B group vitamins: Secondary | ICD-10-CM

## 2024-03-07 DIAGNOSIS — C7951 Secondary malignant neoplasm of bone: Secondary | ICD-10-CM

## 2024-03-08 ENCOUNTER — Other Ambulatory Visit: Payer: Self-pay

## 2024-03-08 ENCOUNTER — Inpatient Hospital Stay

## 2024-03-08 ENCOUNTER — Encounter: Payer: Self-pay | Admitting: Nurse Practitioner

## 2024-03-08 ENCOUNTER — Inpatient Hospital Stay: Attending: Hematology

## 2024-03-08 ENCOUNTER — Inpatient Hospital Stay: Admitting: Nurse Practitioner

## 2024-03-08 ENCOUNTER — Other Ambulatory Visit (HOSPITAL_COMMUNITY): Payer: Self-pay

## 2024-03-08 VITALS — BP 102/54 | HR 101 | Temp 97.3°F | Resp 20 | Wt 170.1 lb

## 2024-03-08 DIAGNOSIS — C61 Malignant neoplasm of prostate: Secondary | ICD-10-CM | POA: Diagnosis not present

## 2024-03-08 DIAGNOSIS — E538 Deficiency of other specified B group vitamins: Secondary | ICD-10-CM | POA: Insufficient documentation

## 2024-03-08 DIAGNOSIS — C7951 Secondary malignant neoplasm of bone: Secondary | ICD-10-CM | POA: Insufficient documentation

## 2024-03-08 DIAGNOSIS — C775 Secondary and unspecified malignant neoplasm of intrapelvic lymph nodes: Secondary | ICD-10-CM | POA: Diagnosis not present

## 2024-03-08 LAB — CBC WITH DIFFERENTIAL (CANCER CENTER ONLY)
Abs Immature Granulocytes: 0.04 K/uL (ref 0.00–0.07)
Basophils Absolute: 0.1 K/uL (ref 0.0–0.1)
Basophils Relative: 1 %
Eosinophils Absolute: 0.1 K/uL (ref 0.0–0.5)
Eosinophils Relative: 1 %
HCT: 37.8 % — ABNORMAL LOW (ref 39.0–52.0)
Hemoglobin: 12.4 g/dL — ABNORMAL LOW (ref 13.0–17.0)
Immature Granulocytes: 0 %
Lymphocytes Relative: 38 %
Lymphs Abs: 3.6 K/uL (ref 0.7–4.0)
MCH: 32.6 pg (ref 26.0–34.0)
MCHC: 32.8 g/dL (ref 30.0–36.0)
MCV: 99.5 fL (ref 80.0–100.0)
Monocytes Absolute: 1.1 K/uL — ABNORMAL HIGH (ref 0.1–1.0)
Monocytes Relative: 11 %
Neutro Abs: 4.6 K/uL (ref 1.7–7.7)
Neutrophils Relative %: 49 %
Platelet Count: 149 K/uL — ABNORMAL LOW (ref 150–400)
RBC: 3.8 MIL/uL — ABNORMAL LOW (ref 4.22–5.81)
RDW: 13.4 % (ref 11.5–15.5)
WBC Count: 9.4 K/uL (ref 4.0–10.5)
nRBC: 0 % (ref 0.0–0.2)

## 2024-03-08 LAB — CMP (CANCER CENTER ONLY)
ALT: 9 U/L (ref 0–44)
AST: 21 U/L (ref 15–41)
Albumin: 3.5 g/dL (ref 3.5–5.0)
Alkaline Phosphatase: 36 U/L — ABNORMAL LOW (ref 38–126)
Anion gap: 4 — ABNORMAL LOW (ref 5–15)
BUN: 20 mg/dL (ref 8–23)
CO2: 28 mmol/L (ref 22–32)
Calcium: 9 mg/dL (ref 8.9–10.3)
Chloride: 105 mmol/L (ref 98–111)
Creatinine: 1.11 mg/dL (ref 0.61–1.24)
GFR, Estimated: >60 mL/min (ref >60)
Glucose, Bld: 92 mg/dL (ref 70–99)
Potassium: 3.9 mmol/L (ref 3.5–5.1)
Sodium: 137 mmol/L (ref 135–145)
Total Bilirubin: 0.9 mg/dL (ref 0.0–1.2)
Total Protein: 6.5 g/dL (ref 6.5–8.1)

## 2024-03-08 LAB — VITAMIN B12: Vitamin B-12: 772 pg/mL (ref 180–914)

## 2024-03-08 LAB — PSA: Prostatic Specific Antigen: <0.02 ng/mL (ref 0.00–4.00)

## 2024-03-08 MED ORDER — ABIRATERONE ACETATE 250 MG PO TABS
1000.0000 mg | ORAL_TABLET | Freq: Every day | ORAL | 2 refills | Status: DC
  Filled 2024-03-08 – 2024-03-13 (×3): qty 120, 30d supply, fill #0

## 2024-03-08 MED ORDER — CYANOCOBALAMIN 1000 MCG/ML IJ SOLN
1000.0000 ug | Freq: Once | INTRAMUSCULAR | Status: AC
  Administered 2024-03-08: 1000 ug via INTRAMUSCULAR
  Filled 2024-03-08: qty 1

## 2024-03-08 MED ORDER — DENOSUMAB 120 MG/1.7ML ~~LOC~~ SOLN
120.0000 mg | Freq: Once | SUBCUTANEOUS | Status: AC
  Administered 2024-03-08: 120 mg via SUBCUTANEOUS
  Filled 2024-03-08: qty 1.7

## 2024-03-08 NOTE — Progress Notes (Signed)
 Alexander Rich   [phone] [phone]    Patient Care Team: Seabron Lenis, MD as PCP - General (Family Medicine) Blanca Elsie RAMAN, MD as Consulting Physician (Cardiology) Seabron Lenis, MD as Attending Physician (Family Medicine) Vertell Pont, RN as Oncology Nurse Navigator Lanny Callander, MD as Consulting Physician (Hematology and Oncology)   CHIEF COMPLAINT: Follow up prostate cancer   Oncology History Overview Note   Cancer Staging  Prostate cancer metastatic to bone [facility]) Staging form: Prostate, AJCC 8th Edition - Clinical: Stage IVB (cT3, cN1, pM1c, PSA: 587) - Signed by Timmy Maude SAUNDERS, MD on 07/17/2022 Prostate specific antigen (PSA) range: 20 or greater     Prostate cancer metastatic to bone (HCC)  07/17/2022 Initial Diagnosis   Prostate cancer metastatic to bone (HCC)   07/17/2022 Cancer Staging   Staging form: Prostate, AJCC 8th Edition - Clinical: Stage IVB (cT3, cN1, pM1c, PSA: 587) - Signed by Timmy Maude SAUNDERS, MD on 07/17/2022 Prostate specific antigen (PSA) range: 20 or greater   07/17/2022 Imaging    IMPRESSION: 1. Signs of prostate cancer with nodal and bone metastasis. 2. L3 metastatic lesion with extensive soft tissue extending into a narrowed central canal. Correlate with any acute symptoms of cord compression and consider MRI for further assessment as warranted. Superior endplate deformity at L3 likely a combination of Schmorl's node and pathologic fracture. Potential for early pathologic fracture at T1 as well. 3. Soft tissue along neurovascular structures in the LEFT pelvis raising the question of sciatic nerve involvement from tumor extension in this area this patient with LEFT-sided hip pain. 4. Bulky nodal disease in the pelvis likely explains lower extremity edema. 5. Bladder wall thickening which is largely circumferential likely related to bladder outlet obstruction with potential small bladder calculus. 6.  Eccentric thickening of the sigmoid in the setting of diverticular changes is nonspecific. Correlation with Cologuard testing could be considered to determine whether further evaluation may be warranted.      CURRENT THERAPY:  Xgeva  and B12 every 2 months Eligard  every 4 months Zytiga  and prednisone   INTERVAL HISTORY  Alexander Rich returns for follow up, last seen by Dr. Lanny 01/05/24. Doing well overall. Denies changes in his health. Tolerating daily zytiga /prednisone  without side effects. Manages R low back/hip pain with oxy 2-3 times daily.  Denies new or worsening pain.  Bowels moving.  Eating and drinking well.  Denies fall.  ROS  All other systems reviewed and negative  Past Medical History:  Diagnosis Date   Constipation    Hip pain    Hyperpigmented skin lesions 03/21/2022   Hypertension    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     Past Surgical History:  Procedure Laterality Date   BACK SURGERY     ESOPHAGOGASTRODUODENOSCOPY N/A 03/24/2022   Procedure: ESOPHAGOGASTRODUODENOSCOPY (EGD);  Surgeon: Burnette Elsie, MD;  Location: [facility];  Service: Gastroenterology;  Laterality: N/A;     Outpatient Encounter Medications as of 03/08/2024  Medication Sig   abiraterone  acetate (ZYTIGA ) 250 MG tablet Take 4 tablets (1,000 mg total) by mouth daily on an empty stomach 1 hour before meals OR 2 hours after a meal   acetaminophen  (TYLENOL ) 325 MG tablet Take 1 tablet (325 mg total) by mouth every 6 (six) hours as needed for mild pain (or Fever >/= 101).   ASCORBIC ACID PO Take 1 tablet by mouth in the morning. Vitamin C, unknown strength   cyanocobalamin  1000 MCG tablet Take  1 tablet (1,000 mcg total) by mouth daily.   furosemide  (LASIX ) 20 MG tablet Take 1 tablet (20 mg total) by mouth daily as needed for fluid or edema. (Patient taking differently: Take 20 mg by mouth daily.)   linaclotide  (LINZESS ) 145 MCG CAPS capsule Take 145 mcg by mouth daily as needed  (constipation).   losartan  (COZAAR ) 100 MG tablet Take 1 tablet (100 mg total) by mouth in the morning.   oxyCODONE  (OXY IR/ROXICODONE ) 5 MG immediate release tablet Take 1-2 tablets (5-10 mg total) by mouth every 6 (six) hours as needed for severe pain (pain score 7-10).   Polyethyl Glycol-Propyl Glycol (SYSTANE OP) Place 1 drop into both eyes 4 (four) times daily as needed (dry eyes).   polyethylene glycol (MIRALAX  / GLYCOLAX ) 17 g packet Take 17 g by mouth daily as needed. Hold for diarrhea. Take when taking percocet   predniSONE  (DELTASONE ) 5 MG tablet Take 1 tablet (5 mg total) by mouth daily with breakfast.   tamsulosin  (FLOMAX ) 0.4 MG CAPS capsule Take 0.4 mg by mouth in the morning.   VITAMIN D PO Take 1 tablet by mouth daily.   [DISCONTINUED] abiraterone  acetate (ZYTIGA ) 250 MG tablet Take 4 tablets (1,000 mg total) by mouth daily on an empty stomach 1 hour before meals OR 2 hours after a meal   No facility-administered encounter medications on file as of 03/08/2024.     Today's Vitals   03/08/24 1026  BP: (!) 102/54  Pulse: (!) 101  Resp: 20  Temp: (!) 97.3 F (36.3 C)  TempSrc: Temporal  SpO2: 99%  Weight: 170 lb 1.6 oz (77.2 kg)   Body mass index is 26.64 kg/m.   ECOG PERFORMANCE STATUS: 1 - Symptomatic but completely ambulatory  PHYSICAL EXAM GENERAL:alert, no distress and comfortable SKIN: no rash  EYES: sclera clear NECK: without mass LYMPH:  no palpable cervical or supraclavicular lymphadenopathy  LUNGS: clear with normal breathing effort HEART: regular rate & rhythm NEURO: alert & oriented x 3 with fluent speech   CBC    Latest Ref Rng & Units 03/08/2024    9:32 AM 01/05/2024    1:40 PM 11/10/2023    9:54 AM  CBC  WBC 4.0 - 10.5 K/uL 9.4  7.7  7.9   Hemoglobin 13.0 - 17.0 g/dL 87.5  88.1  87.9   Hematocrit 39.0 - 52.0 % 37.8  36.4  36.8   Platelets 150 - 400 K/uL 149  143  142       CMP     Latest Ref Rng & Units 03/08/2024    9:32 AM 01/05/2024     1:40 PM 11/10/2023    9:54 AM  CMP  Glucose 70 - 99 mg/dL 92  895  888   BUN 8 - 23 mg/dL 20  18  17    Creatinine 0.61 - 1.24 mg/dL 8.88  8.89  8.79   Sodium 135 - 145 mmol/L 137  141  139   Potassium 3.5 - 5.1 mmol/L 3.9  4.1  3.7   Chloride 98 - 111 mmol/L 105  111  107   CO2 22 - 32 mmol/L 28  27  27    Calcium 8.9 - 10.3 mg/dL 9.0  8.6  8.7   Total Protein 6.5 - 8.1 g/dL 6.5  6.2  6.1   Total Bilirubin 0.0 - 1.2 mg/dL 0.9  0.6  0.8   Alkaline Phos 38 - 126 U/L 36  39  39   AST 15 -  41 U/L 21  21  21    ALT 0 - 44 U/L 9  12  11        ASSESSMENT & PLAN:  Prostate cancer metastatic to bone, Stage IV with node and bone metastasis, PSA 587, diagnosed in 07/2022 -He started Degarelix  on 07/17/2022 in the hospital, changed it to Eligard  30 mg every 4 months for convenience, started on 09/11/2022 -Due to the bulky disease in his L3 and pelvic lymph nodes, with significant pain and leg edema, palliative radiation was recommended but he declined.  -His leg edema has improved since he started ADT however his pain is still significant, switched Casodex  to Zytiga  and prednisone , for better disease control and symptom relief. He started in June 2024. He responded well, both pain and leg edema has improved. He is tolerating well  -Due to his advanced age, limited social support, if he progressed on ADT, would recommend hospice care.  He is not a candidate for chemotherapy    PLAN: Mr. Jurgens is clinically doing well, tolerating ADT plus Zytiga  and prednisone  without significant side effects.  There is no clinical evidence of disease progression.  Labs reviewed, will follow-up the pending PSA from today.  Continue the current regimen. He will receive Xgeva  and B12 today  Return for lab, follow-up, and injections (Xgeva , B12, Eligard ) in 2 months, or sooner if needed   Orders Placed This Encounter  Procedures   Prostate-Specific AG, Serum (For [facility], [facility])    Standing Status:   Standing    Number  of Occurrences:   1    Expiration Date:   03/08/2025      All questions were answered. The patient knows to call the clinic with any problems, questions or concerns. No barriers to learning were detected.   Marshe Shrestha K Yuriko Portales, NP 03/08/2024

## 2024-03-09 ENCOUNTER — Other Ambulatory Visit: Payer: Self-pay

## 2024-03-10 ENCOUNTER — Ambulatory Visit: Payer: Self-pay | Admitting: Nurse Practitioner

## 2024-03-10 NOTE — Telephone Encounter (Addendum)
 Called patient to relay the below lab results as per Lacie Burton NP, patient voiced full understanding and had no further questions.    ----- Message from Mayme MARLA Silversmith sent at 03/10/2024  8:23 AM EST ----- Please let pt know PSA remains undetectable, great news. Continue same regimen. Thanks Lacie NP ----- Message ----- From: Rebecka, Lab In [place] Sent: 03/08/2024   8:09 PM EST To: Lacie K Burton, NP

## 2024-03-13 ENCOUNTER — Other Ambulatory Visit: Payer: Self-pay

## 2024-03-13 NOTE — Progress Notes (Signed)
 Specialty Pharmacy Refill Coordination Note  Alexander Rich is a 89 y.o. male contacted today regarding refills of specialty medication(s) Abiraterone  Acetate (ZYTIGA )   Patient requested Delivery   Delivery date: 03/21/24   Verified address: [address] [place]   Medication will be filled on: 03/20/24

## 2024-03-15 DIAGNOSIS — H04123 Dry eye syndrome of bilateral lacrimal glands: Secondary | ICD-10-CM | POA: Diagnosis not present

## 2024-03-15 DIAGNOSIS — H16142 Punctate keratitis, left eye: Secondary | ICD-10-CM | POA: Diagnosis not present

## 2024-03-15 DIAGNOSIS — H02135 Senile ectropion of left lower eyelid: Secondary | ICD-10-CM | POA: Diagnosis not present

## 2024-03-15 DIAGNOSIS — H02132 Senile ectropion of right lower eyelid: Secondary | ICD-10-CM | POA: Diagnosis not present

## 2024-03-20 ENCOUNTER — Other Ambulatory Visit: Payer: Self-pay

## 2024-03-30 ENCOUNTER — Inpatient Hospital Stay (HOSPITAL_COMMUNITY)
Admission: EM | Admit: 2024-03-30 | Discharge: 2024-04-21 | DRG: 557 | Disposition: A | Discharge status: Home Health | Attending: Internal Medicine | Admitting: Internal Medicine

## 2024-03-30 ENCOUNTER — Encounter (HOSPITAL_COMMUNITY): Payer: Self-pay

## 2024-03-30 ENCOUNTER — Other Ambulatory Visit: Payer: Self-pay

## 2024-03-30 ENCOUNTER — Emergency Department (HOSPITAL_COMMUNITY)

## 2024-03-30 DIAGNOSIS — R296 Repeated falls: Secondary | ICD-10-CM | POA: Diagnosis present

## 2024-03-30 DIAGNOSIS — Z8616 Personal history of COVID-19: Secondary | ICD-10-CM

## 2024-03-30 DIAGNOSIS — Z1152 Encounter for screening for COVID-19: Secondary | ICD-10-CM

## 2024-03-30 DIAGNOSIS — Z886 Allergy status to analgesic agent status: Secondary | ICD-10-CM

## 2024-03-30 DIAGNOSIS — Z9181 History of falling: Secondary | ICD-10-CM

## 2024-03-30 DIAGNOSIS — Z043 Encounter for examination and observation following other accident: Secondary | ICD-10-CM | POA: Diagnosis not present

## 2024-03-30 DIAGNOSIS — I452 Bifascicular block: Secondary | ICD-10-CM | POA: Diagnosis present

## 2024-03-30 DIAGNOSIS — F419 Anxiety disorder, unspecified: Secondary | ICD-10-CM | POA: Diagnosis present

## 2024-03-30 DIAGNOSIS — Z888 Allergy status to other drugs, medicaments and biological substances status: Secondary | ICD-10-CM

## 2024-03-30 DIAGNOSIS — J479 Bronchiectasis, uncomplicated: Secondary | ICD-10-CM | POA: Diagnosis present

## 2024-03-30 DIAGNOSIS — E441 Mild protein-calorie malnutrition: Secondary | ICD-10-CM | POA: Diagnosis present

## 2024-03-30 DIAGNOSIS — C61 Malignant neoplasm of prostate: Secondary | ICD-10-CM | POA: Diagnosis present

## 2024-03-30 DIAGNOSIS — Z66 Do not resuscitate: Secondary | ICD-10-CM | POA: Diagnosis present

## 2024-03-30 DIAGNOSIS — D539 Nutritional anemia, unspecified: Secondary | ICD-10-CM | POA: Diagnosis present

## 2024-03-30 DIAGNOSIS — E876 Hypokalemia: Secondary | ICD-10-CM | POA: Diagnosis present

## 2024-03-30 DIAGNOSIS — M47812 Spondylosis without myelopathy or radiculopathy, cervical region: Secondary | ICD-10-CM | POA: Diagnosis not present

## 2024-03-30 DIAGNOSIS — M19011 Primary osteoarthritis, right shoulder: Secondary | ICD-10-CM | POA: Diagnosis not present

## 2024-03-30 DIAGNOSIS — W19XXXA Unspecified fall, initial encounter: Principal | ICD-10-CM

## 2024-03-30 DIAGNOSIS — G4700 Insomnia, unspecified: Secondary | ICD-10-CM | POA: Diagnosis present

## 2024-03-30 DIAGNOSIS — Y93E1 Activity, personal bathing and showering: Secondary | ICD-10-CM

## 2024-03-30 DIAGNOSIS — S0990XA Unspecified injury of head, initial encounter: Secondary | ICD-10-CM | POA: Diagnosis not present

## 2024-03-30 DIAGNOSIS — Z602 Problems related to living alone: Secondary | ICD-10-CM | POA: Diagnosis present

## 2024-03-30 DIAGNOSIS — M6282 Rhabdomyolysis: Principal | ICD-10-CM | POA: Diagnosis present

## 2024-03-30 DIAGNOSIS — Z8711 Personal history of peptic ulcer disease: Secondary | ICD-10-CM

## 2024-03-30 DIAGNOSIS — K5904 Chronic idiopathic constipation: Secondary | ICD-10-CM | POA: Insufficient documentation

## 2024-03-30 DIAGNOSIS — D7589 Other specified diseases of blood and blood-forming organs: Secondary | ICD-10-CM | POA: Diagnosis present

## 2024-03-30 DIAGNOSIS — K5909 Other constipation: Secondary | ICD-10-CM | POA: Diagnosis present

## 2024-03-30 DIAGNOSIS — Z823 Family history of stroke: Secondary | ICD-10-CM

## 2024-03-30 DIAGNOSIS — Z515 Encounter for palliative care: Secondary | ICD-10-CM

## 2024-03-30 DIAGNOSIS — M25511 Pain in right shoulder: Secondary | ICD-10-CM | POA: Diagnosis present

## 2024-03-30 DIAGNOSIS — E8770 Fluid overload, unspecified: Secondary | ICD-10-CM | POA: Diagnosis present

## 2024-03-30 DIAGNOSIS — Z79891 Long term (current) use of opiate analgesic: Secondary | ICD-10-CM

## 2024-03-30 DIAGNOSIS — E782 Mixed hyperlipidemia: Secondary | ICD-10-CM | POA: Insufficient documentation

## 2024-03-30 DIAGNOSIS — W182XXA Fall in (into) shower or empty bathtub, initial encounter: Secondary | ICD-10-CM | POA: Diagnosis present

## 2024-03-30 DIAGNOSIS — Z8419 Family history of other disorders of kidney and ureter: Secondary | ICD-10-CM

## 2024-03-30 DIAGNOSIS — E559 Vitamin D deficiency, unspecified: Secondary | ICD-10-CM | POA: Diagnosis present

## 2024-03-30 DIAGNOSIS — R531 Weakness: Secondary | ICD-10-CM

## 2024-03-30 DIAGNOSIS — M545 Low back pain, unspecified: Secondary | ICD-10-CM | POA: Diagnosis present

## 2024-03-30 DIAGNOSIS — Z8719 Personal history of other diseases of the digestive system: Secondary | ICD-10-CM

## 2024-03-30 DIAGNOSIS — Y92091 Bathroom in other non-institutional residence as the place of occurrence of the external cause: Secondary | ICD-10-CM

## 2024-03-30 DIAGNOSIS — R54 Age-related physical debility: Secondary | ICD-10-CM | POA: Diagnosis present

## 2024-03-30 DIAGNOSIS — R338 Other retention of urine: Secondary | ICD-10-CM | POA: Diagnosis present

## 2024-03-30 DIAGNOSIS — I499 Cardiac arrhythmia, unspecified: Secondary | ICD-10-CM | POA: Insufficient documentation

## 2024-03-30 DIAGNOSIS — F5105 Insomnia due to other mental disorder: Secondary | ICD-10-CM | POA: Diagnosis present

## 2024-03-30 DIAGNOSIS — N401 Enlarged prostate with lower urinary tract symptoms: Secondary | ICD-10-CM | POA: Diagnosis present

## 2024-03-30 DIAGNOSIS — Z79899 Other long term (current) drug therapy: Secondary | ICD-10-CM

## 2024-03-30 DIAGNOSIS — I10 Essential (primary) hypertension: Secondary | ICD-10-CM | POA: Diagnosis present

## 2024-03-30 DIAGNOSIS — Z7189 Other specified counseling: Secondary | ICD-10-CM

## 2024-03-30 DIAGNOSIS — M1990 Unspecified osteoarthritis, unspecified site: Secondary | ICD-10-CM | POA: Diagnosis present

## 2024-03-30 DIAGNOSIS — E538 Deficiency of other specified B group vitamins: Secondary | ICD-10-CM | POA: Diagnosis present

## 2024-03-30 DIAGNOSIS — Z9889 Other specified postprocedural states: Secondary | ICD-10-CM

## 2024-03-30 DIAGNOSIS — J810 Acute pulmonary edema: Secondary | ICD-10-CM | POA: Diagnosis present

## 2024-03-30 DIAGNOSIS — R1310 Dysphagia, unspecified: Secondary | ICD-10-CM | POA: Diagnosis not present

## 2024-03-30 DIAGNOSIS — D696 Thrombocytopenia, unspecified: Secondary | ICD-10-CM | POA: Diagnosis present

## 2024-03-30 DIAGNOSIS — C7951 Secondary malignant neoplasm of bone: Secondary | ICD-10-CM | POA: Diagnosis present

## 2024-03-30 DIAGNOSIS — G8929 Other chronic pain: Secondary | ICD-10-CM | POA: Diagnosis present

## 2024-03-30 DIAGNOSIS — Z7952 Long term (current) use of systemic steroids: Secondary | ICD-10-CM

## 2024-03-30 DIAGNOSIS — M549 Dorsalgia, unspecified: Secondary | ICD-10-CM | POA: Diagnosis present

## 2024-03-30 DIAGNOSIS — I6782 Cerebral ischemia: Secondary | ICD-10-CM | POA: Diagnosis not present

## 2024-03-30 DIAGNOSIS — Z7969 Long term (current) use of other immunomodulators and immunosuppressants: Secondary | ICD-10-CM

## 2024-03-30 DIAGNOSIS — K5900 Constipation, unspecified: Secondary | ICD-10-CM | POA: Diagnosis present

## 2024-03-30 DIAGNOSIS — K269 Duodenal ulcer, unspecified as acute or chronic, without hemorrhage or perforation: Secondary | ICD-10-CM | POA: Insufficient documentation

## 2024-03-30 DIAGNOSIS — S40012A Contusion of left shoulder, initial encounter: Secondary | ICD-10-CM | POA: Diagnosis present

## 2024-03-30 DIAGNOSIS — Z6826 Body mass index (BMI) 26.0-26.9, adult: Secondary | ICD-10-CM

## 2024-03-30 DIAGNOSIS — R001 Bradycardia, unspecified: Secondary | ICD-10-CM | POA: Diagnosis present

## 2024-03-30 LAB — CBC WITH DIFFERENTIAL/PLATELET
Abs Immature Granulocytes: 0.04 K/uL (ref 0.00–0.07)
Basophils Absolute: 0 K/uL (ref 0.0–0.1)
Basophils Relative: 0 %
Eosinophils Absolute: 0.1 K/uL (ref 0.0–0.5)
Eosinophils Relative: 1 %
HCT: 40.1 % (ref 39.0–52.0)
Hemoglobin: 13 g/dL (ref 13.0–17.0)
Immature Granulocytes: 0 %
Lymphocytes Relative: 20 %
Lymphs Abs: 2 K/uL (ref 0.7–4.0)
MCH: 33 pg (ref 26.0–34.0)
MCHC: 32.4 g/dL (ref 30.0–36.0)
MCV: 101.8 fL — ABNORMAL HIGH (ref 80.0–100.0)
Monocytes Absolute: 1.2 K/uL — ABNORMAL HIGH (ref 0.1–1.0)
Monocytes Relative: 12 %
Neutro Abs: 6.6 K/uL (ref 1.7–7.7)
Neutrophils Relative %: 67 %
Platelets: 147 K/uL — ABNORMAL LOW (ref 150–400)
RBC: 3.94 MIL/uL — ABNORMAL LOW (ref 4.22–5.81)
RDW: 13.7 % (ref 11.5–15.5)
WBC: 10 K/uL (ref 4.0–10.5)
nRBC: 0 % (ref 0.0–0.2)

## 2024-03-30 LAB — COMPREHENSIVE METABOLIC PANEL WITH GFR
ALT: 17 U/L (ref 0–44)
AST: 55 U/L — ABNORMAL HIGH (ref 15–41)
Albumin: 3.4 g/dL — ABNORMAL LOW (ref 3.5–5.0)
Alkaline Phosphatase: 43 U/L (ref 38–126)
Anion gap: 9 (ref 5–15)
BUN: 16 mg/dL (ref 8–23)
CO2: 22 mmol/L (ref 22–32)
Calcium: 8.3 mg/dL — ABNORMAL LOW (ref 8.9–10.3)
Chloride: 108 mmol/L (ref 98–111)
Creatinine, Ser: 0.99 mg/dL (ref 0.61–1.24)
GFR, Estimated: >60 mL/min (ref >60)
Glucose, Bld: 95 mg/dL (ref 70–99)
Potassium: 3.6 mmol/L (ref 3.5–5.1)
Sodium: 139 mmol/L (ref 135–145)
Total Bilirubin: 1.6 mg/dL — ABNORMAL HIGH (ref 0.0–1.2)
Total Protein: 6.2 g/dL — ABNORMAL LOW (ref 6.5–8.1)

## 2024-03-30 LAB — RESP PANEL BY RT-PCR (RSV, FLU A&B, COVID)  RVPGX2
Influenza A by PCR: NEGATIVE
Influenza B by PCR: NEGATIVE
Resp Syncytial Virus by PCR: NEGATIVE
SARS Coronavirus 2 by RT PCR: NEGATIVE

## 2024-03-30 LAB — URINALYSIS, ROUTINE W REFLEX MICROSCOPIC
Bilirubin Urine: NEGATIVE
Glucose, UA: NEGATIVE mg/dL
Hgb urine dipstick: NEGATIVE
Ketones, ur: NEGATIVE mg/dL
Leukocytes,Ua: NEGATIVE
Nitrite: NEGATIVE
Protein, ur: NEGATIVE mg/dL
Specific Gravity, Urine: 1.03 (ref 1.005–1.030)
pH: 5 (ref 5.0–8.0)

## 2024-03-30 LAB — CK: Total CK: 894 U/L — ABNORMAL HIGH (ref 49–397)

## 2024-03-30 MED ORDER — ABIRATERONE ACETATE 250 MG PO TABS: 1000.0000 mg | ORAL_TABLET | Freq: Every day | ORAL | Status: DC

## 2024-03-30 MED ORDER — VITAMIN B-12 1000 MCG PO TABS
1000.0000 ug | ORAL_TABLET | Freq: Every day | ORAL | Status: DC
  Administered 2024-03-30 – 2024-04-21 (×21): 1000 ug via ORAL
  Filled 2024-03-30 (×22): qty 1

## 2024-03-30 MED ORDER — LACTATED RINGERS IV BOLUS
500.0000 mL | Freq: Once | INTRAVENOUS | Status: AC
  Administered 2024-03-30: 500 mL via INTRAVENOUS

## 2024-03-30 MED ORDER — IOHEXOL 300 MG/ML  SOLN
100.0000 mL | Freq: Once | INTRAMUSCULAR | Status: AC | PRN
  Administered 2024-03-30: 100 mL via INTRAVENOUS

## 2024-03-30 MED ORDER — ONDANSETRON HCL 4 MG PO TABS: 4.0000 mg | ORAL_TABLET | Freq: Four times a day (QID) | ORAL | Status: DC | PRN

## 2024-03-30 MED ORDER — ENOXAPARIN SODIUM 40 MG/0.4ML IJ SOSY
40.0000 mg | PREFILLED_SYRINGE | INTRAMUSCULAR | Status: DC
  Administered 2024-03-30 – 2024-04-20 (×22): 40 mg via SUBCUTANEOUS
  Filled 2024-03-30 (×22): qty 0.4

## 2024-03-30 MED ORDER — SODIUM CHLORIDE 0.45 % IV SOLN: INTRAVENOUS | Status: DC

## 2024-03-30 MED ORDER — FUROSEMIDE 40 MG PO TABS: 20.0000 mg | ORAL_TABLET | Freq: Every day | ORAL | Status: DC

## 2024-03-30 MED ORDER — POLYVINYL ALCOHOL 1.4 % OP SOLN
1.0000 [drp] | OPHTHALMIC | Status: DC | PRN
  Administered 2024-03-30 – 2024-04-15 (×4): 1 [drp] via OPHTHALMIC
  Filled 2024-03-30: qty 15

## 2024-03-30 MED ORDER — ACETAMINOPHEN 650 MG RE SUPP: 650.0000 mg | Freq: Four times a day (QID) | RECTAL | Status: DC | PRN

## 2024-03-30 MED ORDER — OXYCODONE HCL 5 MG PO TABS
5.0000 mg | ORAL_TABLET | Freq: Four times a day (QID) | ORAL | Status: DC | PRN
  Administered 2024-03-30 – 2024-04-02 (×7): 5 mg via ORAL
  Filled 2024-03-30 (×7): qty 1

## 2024-03-30 MED ORDER — POLYETHYLENE GLYCOL 3350 17 G PO PACK
17.0000 g | PACK | Freq: Every day | ORAL | Status: DC | PRN
  Administered 2024-04-01: 17 g via ORAL
  Filled 2024-03-30: qty 1

## 2024-03-30 MED ORDER — ONDANSETRON HCL 4 MG/2ML IJ SOLN: 4.0000 mg | Freq: Four times a day (QID) | INTRAMUSCULAR | Status: DC | PRN

## 2024-03-30 MED ORDER — ACETAMINOPHEN 325 MG PO TABS
650.0000 mg | ORAL_TABLET | Freq: Four times a day (QID) | ORAL | Status: DC | PRN
  Administered 2024-03-30 – 2024-04-18 (×6): 650 mg via ORAL
  Filled 2024-03-30 (×6): qty 2

## 2024-03-30 NOTE — ED Notes (Signed)
 Pt ambulated with rolling walker w/o difficulty. Total distance ~50 feet.

## 2024-03-30 NOTE — ED Notes (Signed)
 Provided pt with urinal and requested urine sample.

## 2024-03-30 NOTE — H&P (Signed)
 History and Physical    Patient: Alexander Rich FMW:[id number] DOB: 28-Nov-1925 DOA: 03/30/2024 DOS: the patient was seen and examined on 03/30/2024 PCP: Seabron Lenis, MD  Patient coming from: Home  Chief Complaint: Falls.  Chief Complaint  Patient presents with   Fall   HPI: [name] is a 89 y.o. male with medical history significant of prerenal acidemia, B12 deficiency, history of upper GI bleed, duodenal ulcer, chronic constipation, hyperpigmented skin lesions, hypertension, history of bilateral lower extremity edema, metastatic to bone prostate cancer, osteoarthritis, microcytic anemia, leukocytosis, COVID-19, history of falls who was preceded by EMS twice today due to having falls at home.  He initially refused to come to the hospital on the first visit.  After his second fall, he attempted to get up and was unable to so when EMS suggested to come to the hospital the second time he agreed. He has been generally weak in the past couple days, but denied fever, chills, rhinorrhea, sore throat, wheezing or hemoptysis.  No chest pain, palpitations, diaphoresis, PND, orthopnea or pitting edema of the lower extremities.  No abdominal pain, nausea, emesis, diarrhea, constipation, melena or hematochezia.  No flank pain, dysuria, frequency or hematuria.  No polyuria, polydipsia, polyphagia or blurred vision.   Lab work: Urinalysis was hazy, but otherwise negative.  CBC showed a white count of 10.0 with 67% neutrophils, hemoglobin 13.0 g/dL with an MCV of 898.1 fL and platelets 147.  Total CK8 194 units/L.  CMP showed normal glucose, normal renal function and normal electrolytes after calcium correction.  AST 55 units/L, total bilirubin 1.6 mg/dL, total protein 6.2 and albumin 3.4 g/dL.  Normal ALT, alk phos levels.  Coronavirus, influenza and RSV PCR test was negative.  Imaging: Right shoulder x-ray with no fracture or dislocation.  CT head without contrast with no acute intracranial process.   CT cervical spine with no acute cervical spine fracture.  Stable multilevel cervical degenerative changes.  Stable sclerotic metastasis at the T1 vertebral body.  CT chest/abdomen/pelvis with contrast showed normal heart size.  Three-vessel CAD and aortic atherosclerosis.  Trace pleural effusions.  Bronchiectasis associated with peribronchovascular opacities in the right lower lobe with a possible component of acute bronchitis.  Progression of metastasis in lumbar spine.  Aortic atherosclerosis.  Moderate hiatal hernia.  Liver cysts and colon diverticuli  ED course: Initial vital signs were temperature 99.6 F, pulse 71, respiration 18, BP 149/72 mmHg and O2 sat 98% on room air.  The patient received 500 mL of normal saline bolus.   Review of Systems: As mentioned in the history of present illness. All other systems reviewed and are negative.  Past Medical History:  Diagnosis Date   Constipation    Hip pain    Hyperpigmented skin lesions 03/21/2022   Hypertension    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   Past Surgical History:  Procedure Laterality Date   BACK SURGERY     ESOPHAGOGASTRODUODENOSCOPY N/A 03/24/2022   Procedure: ESOPHAGOGASTRODUODENOSCOPY (EGD);  Surgeon: Burnette Fallow, MD;  Location: [facility];  Service: Gastroenterology;  Laterality: N/A;   Social History:  reports that he has never smoked. He has never used smokeless tobacco. He reports current alcohol  use. He reports that he does not use drugs.  Allergies  Allergen Reactions   Ibuprofen Other (See Comments)    GI bleed   Nebivolol Hcl Other (See Comments)    feels blah; low energy   Pravastatin Other (See Comments)    Lethargy  Sertraline Hcl Other (See Comments)    Terrible feeling   Simvastatin Other (See Comments)    Lethargy   Trazodone Other (See Comments)    Foggy headed    Family History  Problem Relation Age of Onset   Kidney disease Mother    CVA Father     Prior to  Admission medications   Medication Sig Start Date End Date Taking? Authorizing Provider  abiraterone  acetate (ZYTIGA ) 250 MG tablet Take 4 tablets (1,000 mg total) by mouth daily on an empty stomach 1 hour before meals OR 2 hours after a meal 03/08/24   Burton, Lacie K, NP  acetaminophen  (TYLENOL ) 325 MG tablet Take 1 tablet (325 mg total) by mouth every 6 (six) hours as needed for mild pain (or Fever >/= 101). 03/25/22   Sebastian Toribio GAILS, MD  ASCORBIC ACID PO Take 1 tablet by mouth in the morning. Vitamin C, unknown strength    [provider]  cyanocobalamin  1000 MCG tablet Take 1 tablet (1,000 mcg total) by mouth daily. 07/18/22   Patsy Lenis, MD  furosemide  (LASIX ) 20 MG tablet Take 1 tablet (20 mg total) by mouth daily as needed for fluid or edema. Patient taking differently: Take 20 mg by mouth daily. 03/25/22 01/05/24  Sebastian Toribio GAILS, MD  linaclotide  (LINZESS ) 145 MCG CAPS capsule Take 145 mcg by mouth daily as needed (constipation).    [provider]  losartan  (COZAAR ) 100 MG tablet Take 1 tablet (100 mg total) by mouth in the morning. 03/30/22   Sebastian Toribio GAILS, MD  oxyCODONE  (OXY IR/ROXICODONE ) 5 MG immediate release tablet Take 1-2 tablets (5-10 mg total) by mouth every 6 (six) hours as needed for severe pain (pain score 7-10). 03/03/24   Boscia, Heather E, NP  Polyethyl Glycol-Propyl Glycol (SYSTANE OP) Place 1 drop into both eyes 4 (four) times daily as needed (dry eyes).    [provider]  polyethylene glycol (MIRALAX  / GLYCOLAX ) 17 g packet Take 17 g by mouth daily as needed. Hold for diarrhea. Take when taking percocet 03/25/22   Sebastian Toribio GAILS, MD  predniSONE  (DELTASONE ) 5 MG tablet Take 1 tablet (5 mg total) by mouth daily with breakfast. 01/05/24   Lanny Callander, MD  tamsulosin  (FLOMAX ) 0.4 MG CAPS capsule Take 0.4 mg by mouth in the morning. 09/12/19   [provider]  VITAMIN D PO Take 1 tablet by mouth daily.    [provider]     Physical Exam: Vitals:   03/30/24 1207 03/30/24 1538  BP: (!) 149/72 (!) 159/70  Pulse: 71 63  Resp: 18 (!) 26  Temp: 99.6 F (37.6 C) 98.6 F (37 C)  TempSrc: Oral   SpO2: 98% 97%   Physical Exam Vitals and nursing note reviewed.  Constitutional:      Appearance: He is ill-appearing.  HENT:     Head: Normocephalic.     Nose: No rhinorrhea.     Mouth/Throat:     Mouth: Mucous membranes are moist.  Eyes:     General: No scleral icterus.    Pupils: Pupils are equal, round, and reactive to light.  Cardiovascular:     Rate and Rhythm: Normal rate and regular rhythm.  Pulmonary:     Effort: Pulmonary effort is normal.     Breath sounds: Normal breath sounds. No wheezing, rhonchi or rales.  Abdominal:     General: Bowel sounds are normal.     Palpations: Abdomen is soft.  Tenderness: There is no right CVA tenderness, left CVA tenderness or guarding.  Musculoskeletal:     Cervical back: Neck supple.     Right lower leg: No edema.     Left lower leg: No edema.  Skin:    General: Skin is warm and dry.  Neurological:     General: No focal deficit present.     Mental Status: He is alert and oriented to person, place, and time.  Psychiatric:        Mood and Affect: Mood normal.        Behavior: Behavior normal.     Data Reviewed:  Results are pending, will review when available.  EKG: Vent. rate 70 BPM  PR interval 232 ms  QRS duration 146 ms  QT/QTcB 433/468 ms  P-R-T axes 267 -55 2  Sinus or ectopic atrial rhythm  Prolonged PR interval   Assessment and Plan: Principal Problem:   Rhabdomyolysis Due to:   Falls Secondary to:   Generalized weakness Observation/telemetry. Continue IV fluids. Avoid hypotension. Avoid nephrotoxins. Monitor intake and output. Monitor renal function electrolytes. Follow total CK in a.m.  Active Problems:   Prostate cancer metastatic to bone (HCC) Continue oxycodone  5 mg p.o. every 6 hours as needed.    Essential  hypertension Continue furosemide  20 mg p.o. daily.    Thrombocytopenia Monitor platelet count.    Sinus bradycardia Will monitor for 24 to 48 hours given falls.    B12 deficiency   Macrocytosis Continue cyanocobalamin  1000 mcg p.o. daily.    Mild protein malnutrition In the setting of poor oral intake and malignancy. May benefit from protein supplementation. Consider nutritional services evaluation. Follow-up albumin level in the morning.     Advance Care Planning:   Code Status: Full Code   Consults:   Family Communication:   Severity of Illness: The appropriate patient status for this patient is OBSERVATION. Observation status is judged to be reasonable and necessary in order to provide the required intensity of service to ensure the patient's safety. The patient's presenting symptoms, physical exam findings, and initial radiographic and laboratory data in the context of their medical condition is felt to place them at decreased risk for further clinical deterioration. Furthermore, it is anticipated that the patient will be medically stable for discharge from the hospital within 2 midnights of admission.   Author: Alm Dorn Castor, MD 03/30/2024 4:55 PM  For on call review [email or web address].   This document was prepared using Dragon voice recognition software and may contain some unintended transcription errors.

## 2024-03-30 NOTE — ED Triage Notes (Signed)
 Pt BIBA from home after a fall at 3pm yesterday, found @ 1030 am today.  EMS was called to help pt up, pt refused transport to ER.  EMS called second time after 2nd fall, pt attempted to stand.  Pt denies head injury or thinners.  Bruising across left shoulder/back, arms, small avulsion on right elbow.    Pt denies pain

## 2024-03-30 NOTE — ED Provider Notes (Signed)
 [place] EMERGENCY DEPARTMENT AT [facility] Provider Note   CSN: [id number] Arrival date & time: 03/30/24  1155     Patient presents with: Alexander Rich Alexander Rich is a 89 y.o. male patient with past medical history of prostate cancer with metastasis to the bone, hypertension, constipation presents to emergency room with complaint of fall.  Patient reports that he has been progressively weaker over the last 6 months.  He reports that he slipped in the shower falling onto his back and was very weak and could not get himself up.  He was on the ground for approximately 16 hours before EMS came to help him up.  He initially refused transport to the ER but he fell again stating that his legs gave out and  He complains of chronic right shoulder pain left upper and left lower back pain.  He does not think that he hit his head.  He is not anticoagulated.    Fall       Prior to Admission medications   Medication Sig Start Date End Date Taking? Authorizing Provider  abiraterone  acetate (ZYTIGA ) 250 MG tablet Take 4 tablets (1,000 mg total) by mouth daily on an empty stomach 1 hour before meals OR 2 hours after a meal 03/08/24   Burton, Lacie K, NP  acetaminophen  (TYLENOL ) 325 MG tablet Take 1 tablet (325 mg total) by mouth every 6 (six) hours as needed for mild pain (or Fever >/= 101). 03/25/22   Sebastian Toribio GAILS, MD  ASCORBIC ACID PO Take 1 tablet by mouth in the morning. Vitamin C, unknown strength    [provider]  cyanocobalamin  1000 MCG tablet Take 1 tablet (1,000 mcg total) by mouth daily. 07/18/22   Patsy Lenis, MD  furosemide  (LASIX ) 20 MG tablet Take 1 tablet (20 mg total) by mouth daily as needed for fluid or edema. Patient taking differently: Take 20 mg by mouth daily. 03/25/22 01/05/24  Sebastian Toribio GAILS, MD  linaclotide  (LINZESS ) 145 MCG CAPS capsule Take 145 mcg by mouth daily as needed (constipation).    [provider]  losartan  (COZAAR ) 100 MG  tablet Take 1 tablet (100 mg total) by mouth in the morning. 03/30/22   Sebastian Toribio GAILS, MD  oxyCODONE  (OXY IR/ROXICODONE ) 5 MG immediate release tablet Take 1-2 tablets (5-10 mg total) by mouth every 6 (six) hours as needed for severe pain (pain score 7-10). 03/03/24   Boscia, Heather E, NP  Polyethyl Glycol-Propyl Glycol (SYSTANE OP) Place 1 drop into both eyes 4 (four) times daily as needed (dry eyes).    [provider]  polyethylene glycol (MIRALAX  / GLYCOLAX ) 17 g packet Take 17 g by mouth daily as needed. Hold for diarrhea. Take when taking percocet 03/25/22   Sebastian Toribio GAILS, MD  predniSONE  (DELTASONE ) 5 MG tablet Take 1 tablet (5 mg total) by mouth daily with breakfast. 01/05/24   Lanny Callander, MD  tamsulosin  (FLOMAX ) 0.4 MG CAPS capsule Take 0.4 mg by mouth in the morning. 09/12/19   [provider]  VITAMIN D PO Take 1 tablet by mouth daily.    [provider]    Allergies: Ibuprofen, Nebivolol hcl, Pravastatin, Sertraline hcl, Simvastatin, and Trazodone    Review of Systems  Musculoskeletal:  Positive for arthralgias.    Updated Vital Signs BP (!) 159/70   Pulse 63   Temp 98.6 F (37 C)   Resp (!) 26   SpO2 97%   Physical Exam Vitals and  nursing note reviewed.  Constitutional:      General: He is not in acute distress.    Appearance: He is not toxic-appearing.  HENT:     Head: Normocephalic and atraumatic.  Eyes:     General: No scleral icterus.    Conjunctiva/sclera: Conjunctivae normal.  Cardiovascular:     Rate and Rhythm: Normal rate and regular rhythm.     Pulses: Normal pulses.     Heart sounds: Normal heart sounds.  Pulmonary:     Effort: Pulmonary effort is normal. No respiratory distress.     Breath sounds: Normal breath sounds.     Comments: No bruising over chest or abdomen.  Abdominal:     General: Abdomen is flat. Bowel sounds are normal.     Palpations: Abdomen is soft.     Tenderness: There is no abdominal tenderness.   Skin:    General: Skin is warm and dry.     Findings: No lesion.     Comments: Bruising over left shoulder upper and mid back. No cervical , thoracic or lumbar midline tenderness.   Neurological:     General: No focal deficit present.     Mental Status: He is alert and oriented to person, place, and time. Mental status is at baseline.     Comments: Moves extremities well, some difficulty moving right shoulder due to chronic right shoulder pain.       (all labs ordered are listed, but only abnormal results are displayed) Labs Reviewed  COMPREHENSIVE METABOLIC PANEL WITH GFR - Abnormal; Notable for the following components:      Result Value   Calcium 8.3 (*)    Total Protein 6.2 (*)    Albumin 3.4 (*)    AST 55 (*)    Total Bilirubin 1.6 (*)    All other components within normal limits  CBC WITH DIFFERENTIAL/PLATELET - Abnormal; Notable for the following components:   RBC 3.94 (*)    MCV 101.8 (*)    Platelets 147 (*)    Monocytes Absolute 1.2 (*)    All other components within normal limits  CK - Abnormal; Notable for the following components:   Total CK 894 (*)    All other components within normal limits  URINALYSIS, ROUTINE W REFLEX MICROSCOPIC - Abnormal; Notable for the following components:   APPearance HAZY (*)    All other components within normal limits  RESP PANEL BY RT-PCR (RSV, FLU A&B, COVID)  [id number]    EKG: EKG Interpretation Date/Time:  Thursday March 30 2024 12:50:45 EST Ventricular Rate:  70 PR Interval:  232 QRS Duration:  146 QT Interval:  433 QTC Calculation: 468 R Axis:   -55  Text Interpretation: Sinus or ectopic atrial rhythm Prolonged PR interval RBBB and LAFB no significant change since July 2024 Confirmed by Freddi Hamilton [phone]) on 03/30/2024 12:56:05 PM  Radiology: CT CHEST ABDOMEN PELVIS W CONTRAST Result Date: 03/30/2024 CLINICAL DATA:  Repeated falls. EXAM: CT CHEST, ABDOMEN, AND PELVIS WITH CONTRAST TECHNIQUE: Multidetector CT  imaging of the chest, abdomen and pelvis was performed following the standard protocol during bolus administration of intravenous contrast. RADIATION DOSE REDUCTION: This exam was performed according to the departmental dose-optimization program which includes automated exposure control, adjustment of the mA and/or kV according to patient size and/or use of iterative reconstruction technique. CONTRAST:  OMNIPAQUE  IOHEXOL  300 MG/ML  SOLN COMPARISON:  07/17/2022. FINDINGS: CT CHEST FINDINGS Cardiovascular: Heart normal in size. Three-vessel coronary artery calcifications. No pericardial effusion.  Great vessels normal in caliber. Aortic atherosclerosis. Mediastinum/Nodes: No mediastinal hematoma. No neck base, mediastinal or hilar masses. No enlarged lymph nodes. Trachea esophagus are unremarkable. Lungs/Pleura: Trace pleural effusions. Bronchiectasis with associated Peri broncho vascular opacities in the right lower lobe. Linear type opacity noted at the base of the left lower lobe. Mild peripheral areas of interstitial thickening bilaterally. No lung mass or suspicious nodule. Scattered tiny stable nodules, benign. No pneumothorax. Musculoskeletal: No acute fracture. Sclerotic changes of the T1 vertebra, stable from the prior CT. No chest wall mass. CT ABDOMEN PELVIS FINDINGS Hepatobiliary: Liver normal in size and overall attenuation. Multiple liver cysts, largest measuring 7.8 cm, stable from the prior CT. Gallbladder is distended. Tiny stone in the dependent gallbladder. No wall thickening or inflammation. No bile duct dilation. Pancreas: Partial fatty replacement of the pancreas. No pancreatic mass or inflammation. Spleen: Normal in size without focal abnormality. Adrenals/Urinary Tract: Normal adrenal glands. Kidneys normal in overall size, orientation and position with symmetric enhancement. Several renal cortical cysts, largest exophytic from the lateral lower pole the right kidney, 2.8 cm. No follow-up  recommended. No stones. No hydronephrosis. Normal ureters. Normal bladder. Stomach/Bowel: Moderate hiatal hernia. Stomach otherwise unremarkable. Small bowel and colon are normal in caliber. No wall thickening. No evidence of a bowel injury or mesenteric hematoma. No inflammation. Scattered diverticula mostly along the left colon. No diverticulitis. Vascular/Lymphatic: No vascular injury. Aortic atherosclerosis. No aneurysm. No enlarged lymph nodes. Reproductive: Enlarged prostate, 5.8 x 4.8 cm transversely, stable. Other: Small fat containing inguinal hernias, right greater than left, stable. No ascites. Musculoskeletal: No acute fracture. Depression of the upper endplate of L3 by an apparent prominent Schmorl's node. There is sclerosis of the posterior elements, most evident involving the pedicle and facet on the left of L3. There are additional small sclerotic lesions. Sclerotic changes at L3 have progressed since the prior CT. IMPRESSION: 1. No acute injury to the chest, abdomen or pelvis. 2. Chronic bronchiectasis in the right lower lobe with peribronchovascular opacities consistent with atelectasis/scarring. Consider a component of acute bronchitis if there are consistent clinical findings. Trace bilateral pleural effusions. 3. Areas of skeletal sclerosis, most evident in the lumbar spine, consistent with blastic prostate carcinoma metastases. Sclerotic changes have progressed in the lumbar spine since the prior exam. 4. No current enlarged lymph nodes. Bulky adenopathy noted on the prior abdomen pelvis CT is no longer visualized. 5. Chronic findings include aortic atherosclerosis, moderate hiatal hernia, liver cysts and colonic diverticula. 6. Stable prostate enlargement. Electronically Signed   By: Alm Parkins M.D.   On: 03/30/2024 15:11   CT Cervical Spine Wo Contrast Result Date: 03/30/2024 CLINICAL DATA:  Multiple falls, history of metastatic prostate cancer EXAM: CT CERVICAL SPINE WITHOUT CONTRAST  TECHNIQUE: Multidetector CT imaging of the cervical spine was performed without intravenous contrast. Multiplanar CT image reconstructions were also generated. RADIATION DOSE REDUCTION: This exam was performed according to the departmental dose-optimization program which includes automated exposure control, adjustment of the mA and/or kV according to patient size and/or use of iterative reconstruction technique. COMPARISON:  11/02/2022 FINDINGS: Alignment: Alignment is anatomic. Skull base and vertebrae: No acute displaced fracture. Chronic sclerosis of the T1 vertebral body compatible with known metastatic prostate cancer. No other destructive bony abnormalities. Soft tissues and spinal canal: No prevertebral fluid or swelling. No visible canal hematoma. Disc levels: Multilevel facet hypertrophic changes with bony fusion across the left C4-5 facet joint. There is partial bony fusion across the C4-5 disc space as well. Moderate  spondylosis at C3-4. Upper chest: Airway is patent.  Lung apices are clear. Other: Reconstructed images demonstrate no additional findings. IMPRESSION: 1. No acute cervical spine fracture. 2. Stable multilevel cervical degenerative changes. 3. Stable sclerotic metastasis at the T1 vertebral body. Electronically Signed   By: Ozell Daring M.D.   On: 03/30/2024 15:02   CT Head Wo Contrast Result Date: 03/30/2024 CLINICAL DATA:  Multiple falls, head trauma EXAM: CT HEAD WITHOUT CONTRAST TECHNIQUE: Contiguous axial images were obtained from the base of the skull through the vertex without intravenous contrast. RADIATION DOSE REDUCTION: This exam was performed according to the departmental dose-optimization program which includes automated exposure control, adjustment of the mA and/or kV according to patient size and/or use of iterative reconstruction technique. COMPARISON:  11/02/2022 FINDINGS: Brain: Chronic small vessel ischemic changes are seen within the periventricular white matter and  bilateral thalami. No acute infarct or hemorrhage. Lateral ventricles and midline structures are unremarkable. No acute extra-axial fluid collections. No mass effect. Vascular: No hyperdense vessel or unexpected calcification. Skull: Normal. Negative for fracture or focal lesion. Sinuses/Orbits: No acute finding. Other: None. IMPRESSION: 1. No acute intracranial process. Electronically Signed   By: Ozell Daring M.D.   On: 03/30/2024 14:58   DG Shoulder Right Result Date: 03/30/2024 CLINICAL DATA:  Clemens yesterday afternoon, found this morning. Right shoulder pain. EXAM: RIGHT SHOULDER - 2+ VIEW COMPARISON:  None Available. FINDINGS: No fracture.  No bone lesion. Glenohumeral and AC joints are normally aligned. Mild AC joint osteoarthritis. Narrowed subacromial space suggests a chronic rotator cuff tear. Skeletal structures are demineralized. IMPRESSION: 1. No fracture or dislocation. Electronically Signed   By: Alm Parkins M.D.   On: 03/30/2024 14:39     Procedures   Medications Ordered in the ED  lactated ringers  bolus 500 mL (0 mLs Intravenous Stopped 03/30/24 1609)  iohexol  (OMNIPAQUE ) 300 MG/ML solution 100 mL (100 mLs Intravenous Contrast Given 03/30/24 1421)                                    Medical Decision Making Amount and/or Complexity of Data Reviewed Labs: ordered. Radiology: ordered.  Risk Prescription drug management. Decision regarding hospitalization.   This patient presents to the ED for concern of fall, this involves an extensive number of treatment options, and is a complaint that carries with it a high risk of complications and morbidity.  The differential diagnosis includes rhabdo, electrolyte abnormality, dehydration, infection, fracture, dehydration   Co morbidities that complicate the patient evaluation  Prostate cancer    Additional history obtained:  Additional history obtained from reviewed office visit 03/08/2024   Lab Tests:  I personally  interpreted labs.  The pertinent results include:   Patient has no leukocytosis, stable anemia.  No significant electrolyte abnormality.  Respiratory panel and UA unremarkable. CK mildly elevated at 894   Imaging Studies ordered:  I ordered imaging studies including CT scan of head cervical spine and chest abdomen pelvis. I independently visualized and interpreted imaging which showed no acute findings I agree with the radiologist interpretation   Cardiac Monitoring: / EKG:  The patient was maintained on a cardiac monitor.  I personally viewed and interpreted the cardiac monitored which showed an underlying rhythm of: normal sinus    Problem List / ED Course / Critical interventions / Medication management  Presents to emergency room after having a fall which she reports to be mechanical in  the shower.  Unfortunately felt too weak to get up and laid there for several hours.  EMS was called and patient originally refused to come in.  He did have another fall which he reports mechanical as well.  He reports progressive generalized weakness for the several months.  He does walk with a walker at baseline.  He denies any focal deficit.  He does not think that he hit his head and he is not on Eliquis.  I did obtain CT scan of head and cervical spine which shows no acute findings.  He does have bruising over left shoulder and upper back which seem most consistent with superficial ecchymosis.  CT scan of chest abdomen pelvis shows no traumatic findings and no acute findings.  He has been stable throughout stay in emergency room and lab work is remarkable.  He was able to ambulate with a walker here only able to take a few steps/very weak. He tells me this did not go well and he lives at home alone and thinks if he goes home he will just fall again and have to come right back. May benefit PT/OT eval. Is agreeable to admission.  I ordered medication including LR, patient reports mildly improvement.          Final diagnoses:  Fall, initial encounter  Weakness  Non-traumatic rhabdomyolysis    ED Discharge Orders     None          Alexander Rich 03/30/24 1703    Freddi Hamilton, MD 04/01/24 WINDELL

## 2024-03-31 DIAGNOSIS — M6282 Rhabdomyolysis: Secondary | ICD-10-CM | POA: Diagnosis not present

## 2024-03-31 LAB — COMPREHENSIVE METABOLIC PANEL WITH GFR
ALT: 19 U/L (ref 0–44)
AST: 55 U/L — ABNORMAL HIGH (ref 15–41)
Albumin: 2.6 g/dL — ABNORMAL LOW (ref 3.5–5.0)
Alkaline Phosphatase: 38 U/L (ref 38–126)
Anion gap: 9 (ref 5–15)
BUN: 14 mg/dL (ref 8–23)
CO2: 20 mmol/L — ABNORMAL LOW (ref 22–32)
Calcium: 7.8 mg/dL — ABNORMAL LOW (ref 8.9–10.3)
Chloride: 107 mmol/L (ref 98–111)
Creatinine, Ser: 0.91 mg/dL (ref 0.61–1.24)
GFR, Estimated: >60 mL/min (ref >60)
Glucose, Bld: 78 mg/dL (ref 70–99)
Potassium: 3.6 mmol/L (ref 3.5–5.1)
Sodium: 136 mmol/L (ref 135–145)
Total Bilirubin: 1.3 mg/dL — ABNORMAL HIGH (ref 0.0–1.2)
Total Protein: 5.2 g/dL — ABNORMAL LOW (ref 6.5–8.1)

## 2024-03-31 LAB — CBC
HCT: 36.8 % — ABNORMAL LOW (ref 39.0–52.0)
Hemoglobin: 11.7 g/dL — ABNORMAL LOW (ref 13.0–17.0)
MCH: 32.9 pg (ref 26.0–34.0)
MCHC: 31.8 g/dL (ref 30.0–36.0)
MCV: 103.4 fL — ABNORMAL HIGH (ref 80.0–100.0)
Platelets: 120 K/uL — ABNORMAL LOW (ref 150–400)
RBC: 3.56 MIL/uL — ABNORMAL LOW (ref 4.22–5.81)
RDW: 13.7 % (ref 11.5–15.5)
WBC: 6.5 K/uL (ref 4.0–10.5)
nRBC: 0 % (ref 0.0–0.2)

## 2024-03-31 LAB — CK: Total CK: 998 U/L — ABNORMAL HIGH (ref 49–397)

## 2024-03-31 MED ORDER — TAMSULOSIN HCL 0.4 MG PO CAPS
0.4000 mg | ORAL_CAPSULE | Freq: Every morning | ORAL | Status: DC
  Administered 2024-03-31 – 2024-04-21 (×21): 0.4 mg via ORAL
  Filled 2024-03-31 (×22): qty 1

## 2024-03-31 MED ORDER — SODIUM CHLORIDE 0.9 % IV SOLN: INTRAVENOUS | Status: AC

## 2024-03-31 MED ORDER — LOSARTAN POTASSIUM 50 MG PO TABS
100.0000 mg | ORAL_TABLET | Freq: Every morning | ORAL | Status: DC
  Administered 2024-03-31 – 2024-04-14 (×14): 100 mg via ORAL
  Filled 2024-03-31 (×18): qty 2

## 2024-03-31 NOTE — Care Management Obs Status (Signed)
 MEDICARE OBSERVATION STATUS NOTIFICATION   Patient Details  Name: Alexander Rich MRN: [id number] Date of Birth: October 07, 1925   Medicare Observation Status Notification Given:  Yes    Toy LITTIE Agar, RN 03/31/2024, 3:20 PM

## 2024-03-31 NOTE — Plan of Care (Signed)

## 2024-03-31 NOTE — Progress Notes (Signed)
   03/31/24 1301  TOC Brief Assessment  Insurance and Status Reviewed  Patient has primary care physician Yes Bertie, Alm, MD)  Home environment has been reviewed Home alone  Prior level of function: Independent  Prior/Current Home Services No current home services  Social Drivers of Health Review SDOH reviewed no interventions necessary  Readmission risk has been reviewed Yes  Transition of care needs no transition of care needs at this time   Inpatient care manager acknowledges consult for SNF. There are currently no SNF recommendations. CM will follow.

## 2024-03-31 NOTE — Progress Notes (Signed)
 Physical Therapy Evaluation Patient Details Name: Alexander Rich MRN: [id number] DOB: 01/03/1926 Today's Date: 03/31/2024  History of Present Illness  89 yo male admitted to hospital on 03/30/2024 due to 2 falls secondary to progressive weakness, pt found to have rhabdomyolysis. 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  Clinical Impression      Pt admitted with above diagnosis.  Pt currently with functional limitations due to the deficits listed below (see PT Problem List). Pt in bed when PT arrived. Pt reports feeling very weak and tired s/p 2 falls. Pt states he is not wanting to live alone any longer and wanting LTC placement, however pt safety and IND with mobility demonstrated at time of eval does not warrant SNF for short term rehab at this time. Pt may benefit from further research and investigation with family support for ILF vs ALF vs SNF placement. Pt required CGA and increased time with hospital bed features for supine to sit, CGA for sit to stand  from EOB, CGA and cues for safety with gait tasks using RW 90 feet in hallway. Pt left seated in recliner, all needs in place and set up for lunch.  Pt will benefit from acute skilled PT to increase their independence and safety with mobility to allow discharge.       If plan is discharge home, recommend the following: A little help with walking and/or transfers;A little help with bathing/dressing/bathroom;Assistance with cooking/housework;Assist for transportation;Help with stairs or ramp for entrance   Can travel by private vehicle        Equipment Recommendations None recommended by PT  Recommendations for Other Services       Functional Status Assessment Patient has had a recent decline in their functional status and demonstrates the ability to make significant improvements in function in a reasonable and predictable  amount of time.     Precautions / Restrictions Precautions Precautions: Fall Restrictions Weight Bearing Restrictions Per Provider Order: No      Mobility  Bed Mobility Overal bed mobility: Needs Assistance Bed Mobility: Supine to Sit     Supine to sit: Contact guard     General bed mobility comments: use of hospital bed and increased time    Transfers Overall transfer level: Needs assistance Equipment used: Rolling walker (2 wheels) Transfers: Sit to/from Stand Sit to Stand: Contact guard assist           General transfer comment: min cues    Ambulation/Gait Ambulation/Gait assistance: Contact guard assist Gait Distance (Feet): 80 Feet Assistive device: Rolling walker (2 wheels) Gait Pattern/deviations: Step-through pattern, Trunk flexed, Wide base of support Gait velocity: decreased     General Gait Details: slight trunk flexion, min cues for posture, safety, RW management and maintaining proper body position inside RW  Stairs            Wheelchair Mobility     Tilt Bed    Modified Rankin (Stroke Patients Only)       Balance Overall balance assessment: Needs assistance Sitting-balance support: Feet supported Sitting balance-Leahy Scale: Good     Standing balance support: Bilateral upper extremity supported, During functional activity, Reliant on assistive device for balance Standing balance-Leahy Scale: Poor                               Pertinent Vitals/Pain Pain  Assessment Pain Assessment: No/denies pain    Home Living Family/patient expects to be discharged to:: Private residence Living Arrangements: Alone Available Help at Discharge: Family;Available PRN/intermittently Type of Home: House Home Access: Stairs to enter Entrance Stairs-Rails: Right Entrance Stairs-Number of Steps: 3   Home Layout: One level Home Equipment: Agricultural Consultant (2 wheels);Cane - single point;Rollator (4 wheels)      Prior Function Prior  Level of Function : Independent/Modified Independent;Driving             Mobility Comments: mod I with intermittent use of SPC in home and rollator ADLs Comments: mod I for all ADLs, self care tasks and IADLs pt reports difficulty donning socks     Extremity/Trunk Assessment        Lower Extremity Assessment Lower Extremity Assessment: Generalized weakness    Cervical / Trunk Assessment Cervical / Trunk Assessment: Normal  Communication   Communication Communication: No apparent difficulties    Cognition Arousal: Alert Behavior During Therapy: WFL for tasks assessed/performed   PT - Cognitive impairments: No apparent impairments                         Following commands: Intact       Cueing       General Comments      Exercises     Assessment/Plan    PT Assessment Patient needs continued PT services  PT Problem List Decreased activity tolerance;Decreased balance;Decreased mobility;Decreased coordination       PT Treatment Interventions DME instruction;Gait training;Stair training;Functional mobility training;Therapeutic activities;Therapeutic exercise;Balance training;Neuromuscular re-education;Patient/family education    PT Goals (Current goals can be found in the Care Plan section)  Acute Rehab PT Goals Patient Stated Goal: to go to a home and get some help PT Goal Formulation: With patient Time For Goal Achievement: 04/14/24 Potential to Achieve Goals: Good    Frequency Min 3X/week     Co-evaluation               AM-PAC PT 6 Clicks Mobility  Outcome Measure Help needed turning from your back to your side while in a flat bed without using bedrails?: None Help needed moving from lying on your back to sitting on the side of a flat bed without using bedrails?: A Little Help needed moving to and from a bed to a chair (including a wheelchair)?: A Little Help needed standing up from a chair using your arms (e.g., wheelchair or  bedside chair)?: A Little Help needed to walk in hospital room?: A Little Help needed climbing 3-5 steps with a railing? : A Lot 6 Click Score: 18    End of Session Equipment Utilized During Treatment: Gait belt Activity Tolerance: Patient tolerated treatment well Patient left: in chair;with call bell/phone within reach;with chair alarm set Nurse Communication: Mobility status PT Visit Diagnosis: Unsteadiness on feet (R26.81);Other abnormalities of gait and mobility (R26.89);Repeated falls (R29.6);Muscle weakness (generalized) (M62.81);Difficulty in walking, not elsewhere classified (R26.2)    Time: 1111-1140 PT Time Calculation (min) (ACUTE ONLY): 29 min   Charges:   PT Evaluation $PT Eval Low Complexity: 1 Low PT Treatments $Gait Training: 8-22 mins PT General Charges $$ ACUTE PT VISIT: 1 Visit         [place], PT Acute Rehab   [place] VEAR Drone 03/31/2024, 1:49 PM

## 2024-03-31 NOTE — Progress Notes (Signed)
 PROGRESS NOTE  Alexander Rich  FMW:[id number] DOB: [birth year] DOA: 03/30/2024 PCP: Seabron Lenis, MD   Brief Narrative: Patient is a 89 year old male with a history of upper GI bleed, duodenal ulcer, hypertension, metastatic prostate cancer to bone, recurrent falls who presented with a fall from home,weakness.  She lives alone.  Ambulates with the help of walker.  He felt weak and fell on the floor of the bathtub and could not get up for around 16 hours .on presentation he was hypertensive.  CK level was in the range of 800s.  Right shoulder x-ray did not show any fracture or dislocation.  CT head did not show any acute findings as well as CT cervical spine.  Showed multilevel cervical degenerative changes, stable sclerotic metastasis at the T1 vertebral body.  CT chest abdomen/pelvis did not show any acute findings.  Patient admitted for the management of generalized weakness, rhabdomyolysis.  Started on IV fluid.  PT consulted.  Patient states he does not want to live alone and wants to go to SNF for long-term .TOC consulted   Assessment & Plan:  Principal Problem:   Rhabdomyolysis Active Problems:   Prostate cancer metastatic to bone [facility])   Essential hypertension   Thrombocytopenia   Sinus bradycardia   Generalized weakness   B12 deficiency   Macrocytosis   Mild protein malnutrition   Falls   Generalized weakness/recurrent falls/rhabdomyolysis: Continue gentle IV fluid.  .  PT consulted.  Lives alone.  Ambulates with the help of walker.  He has 2 nieces.  He wants to go to SNF for long-term.  TOC consulted  Hypertension: Remains hypertensive.  Takes losartan  at home.  Restarted  Metastatic prostate cancer to bone: Continue Flomax   Sinus bradycardia: Stable.  Avoid AV nodal blocking agents  Thrombocytopenia: Stable.  Continue to monitor  History of vitamin B12 deficiency: Continue supplementation          DVT prophylaxis:enoxaparin  (LOVENOX ) injection 40 mg Start:  03/30/24 2200     Code Status: Full Code  Family Communication: None at the bedside  Patient status: Observation  Patient is from : Home  Anticipated discharge to: SNF  Estimated DC date: 2 to 3 days   Consultants: None  Procedures: None  Antimicrobials:  Anti-infectives (From admission, onward)    None       Subjective: Patient seen and examined at bedside today.  During my evaluation, he was overall comfortable, sitting on the bed.  Alert and oriented.  Looks very weak and deconditioned.  He is eating his breakfast.  He said this was his first meal after several days.  He had decreased appetite.  He says he does not want to live alone again and wants to go to nursing facility.  Objective: Vitals:   03/30/24 1823 03/30/24 2224 03/31/24 0225 03/31/24 0615  BP: (!) 127/59 (!) 150/74 (!) 154/76 (!) 153/66  Pulse: 69 81 65 79  Resp: 18 20 (!) 22 (!) 21  Temp: 97.6 F (36.4 C) 98.6 F (37 C) 98.5 F (36.9 C) 97.6 F (36.4 C)  TempSrc:  Oral Oral Oral  SpO2: 100% 97% 97% 96%  Weight: 76.8 kg     Height: 5' 7 (1.702 m)       Intake/Output Summary (Last 24 hours) at 03/31/2024 0808 Last data filed at 03/31/2024 0600 Gross per 24 hour  Intake 1905 ml  Output 700 ml  Net 1205 ml   Filed Weights   03/30/24 1823  Weight: 76.8 kg  Examination:  General exam: Overall comfortable, not in distress, very pleasant elderly gentleman, chronically deconditioned HEENT: PERRL Respiratory system:  no wheezes or crackles  Cardiovascular system: S1 & S2 heard, RRR.  Gastrointestinal system: Abdomen is nondistended, soft and nontender. Central nervous system: Alert and oriented Extremities: No edema, no clubbing ,no cyanosis Skin: No rashes, no ulcers,no icterus     Data Reviewed: I have personally reviewed following labs and imaging studies  CBC: Recent Labs  Lab 03/30/24 1215 03/31/24 0511  WBC 10.0 6.5  NEUTROABS 6.6  --   HGB 13.0 11.7*  HCT 40.1 36.8*   MCV 101.8* 103.4*  PLT 147* 120*   Basic Metabolic Panel: Recent Labs  Lab 03/30/24 1215 03/31/24 0511  NA 139 136  K 3.6 3.6  CL 108 107  CO2 22 20*  GLUCOSE 95 78  BUN 16 14  CREATININE 0.99 0.91  CALCIUM 8.3* 7.8*     Recent Results (from the past 240 hours)  Resp panel by RT-PCR (RSV, Flu A&B, Covid) Anterior Nasal Swab     Status: None   Collection Time: 03/30/24  1:23 PM   Specimen: Anterior Nasal Swab  Result Value Ref Range Status   SARS Coronavirus 2 by RT PCR NEGATIVE NEGATIVE Final    Comment: (NOTE) SARS-CoV-2 target nucleic acids are NOT DETECTED.  The SARS-CoV-2 RNA is generally detectable in upper respiratory specimens during the acute phase of infection. The lowest concentration of SARS-CoV-2 viral copies this assay can detect is 138 copies/mL. A negative result does not preclude SARS-Cov-2 infection and should not be used as the sole basis for treatment or other patient management decisions. A negative result may occur with  improper specimen collection/handling, submission of specimen other than nasopharyngeal swab, presence of viral mutation(s) within the areas targeted by this assay, and inadequate number of viral copies(<138 copies/mL). A negative result must be combined with clinical observations, patient history, and epidemiological information. The expected result is Negative.  Fact Sheet for Patients:  bloggercourse.com  Fact Sheet for Healthcare Providers:  seriousbroker.it  This test is no t yet approved or cleared by the [place]  FDA and  has been authorized for detection and/or diagnosis of SARS-CoV-2 by FDA under an Emergency Use Authorization (EUA). This EUA will remain  in effect (meaning this test can be used) for the duration of the COVID-19 declaration under Section 564(b)(1) of the Act, 21 U.S.C.section 360bbb-3(b)(1), unless the authorization is terminated  or revoked  sooner.       Influenza A by PCR NEGATIVE NEGATIVE Final   Influenza B by PCR NEGATIVE NEGATIVE Final    Comment: (NOTE) The Xpert Xpress SARS-CoV-2/FLU/RSV plus assay is intended as an aid in the diagnosis of influenza from Nasopharyngeal swab specimens and should not be used as a sole basis for treatment. Nasal washings and aspirates are unacceptable for Xpert Xpress SARS-CoV-2/FLU/RSV testing.  Fact Sheet for Patients: bloggercourse.com  Fact Sheet for Healthcare Providers: seriousbroker.it  This test is not yet approved or cleared by the [place]  FDA and has been authorized for detection and/or diagnosis of SARS-CoV-2 by FDA under an Emergency Use Authorization (EUA). This EUA will remain in effect (meaning this test can be used) for the duration of the COVID-19 declaration under Section 564(b)(1) of the Act, 21 U.S.C. section 360bbb-3(b)(1), unless the authorization is terminated or revoked.     Resp Syncytial Virus by PCR NEGATIVE NEGATIVE Final    Comment: (NOTE) Fact Sheet for Patients: bloggercourse.com  Fact  Sheet for Healthcare Providers: seriousbroker.it  This test is not yet approved or cleared by the [place]  FDA and has been authorized for detection and/or diagnosis of SARS-CoV-2 by FDA under an Emergency Use Authorization (EUA). This EUA will remain in effect (meaning this test can be used) for the duration of the COVID-19 declaration under Section 564(b)(1) of the Act, 21 U.S.C. section 360bbb-3(b)(1), unless the authorization is terminated or revoked.  Performed at [facility], [address], [place], [place] [address]      Radiology Studies: CT CHEST ABDOMEN PELVIS W CONTRAST Result Date: 03/30/2024 CLINICAL DATA:  Repeated falls. EXAM: CT CHEST, ABDOMEN, AND PELVIS WITH CONTRAST TECHNIQUE: Multidetector CT imaging of  the chest, abdomen and pelvis was performed following the standard protocol during bolus administration of intravenous contrast. RADIATION DOSE REDUCTION: This exam was performed according to the departmental dose-optimization program which includes automated exposure control, adjustment of the mA and/or kV according to patient size and/or use of iterative reconstruction technique. CONTRAST:  OMNIPAQUE  IOHEXOL  300 MG/ML  SOLN COMPARISON:  07/17/2022. FINDINGS: CT CHEST FINDINGS Cardiovascular: Heart normal in size. Three-vessel coronary artery calcifications. No pericardial effusion. Great vessels normal in caliber. Aortic atherosclerosis. Mediastinum/Nodes: No mediastinal hematoma. No neck base, mediastinal or hilar masses. No enlarged lymph nodes. Trachea esophagus are unremarkable. Lungs/Pleura: Trace pleural effusions. Bronchiectasis with associated Peri broncho vascular opacities in the right lower lobe. Linear type opacity noted at the base of the left lower lobe. Mild peripheral areas of interstitial thickening bilaterally. No lung mass or suspicious nodule. Scattered tiny stable nodules, benign. No pneumothorax. Musculoskeletal: No acute fracture. Sclerotic changes of the T1 vertebra, stable from the prior CT. No chest wall mass. CT ABDOMEN PELVIS FINDINGS Hepatobiliary: Liver normal in size and overall attenuation. Multiple liver cysts, largest measuring 7.8 cm, stable from the prior CT. Gallbladder is distended. Tiny stone in the dependent gallbladder. No wall thickening or inflammation. No bile duct dilation. Pancreas: Partial fatty replacement of the pancreas. No pancreatic mass or inflammation. Spleen: Normal in size without focal abnormality. Adrenals/Urinary Tract: Normal adrenal glands. Kidneys normal in overall size, orientation and position with symmetric enhancement. Several renal cortical cysts, largest exophytic from the lateral lower pole the right kidney, 2.8 cm. No follow-up  recommended. No stones. No hydronephrosis. Normal ureters. Normal bladder. Stomach/Bowel: Moderate hiatal hernia. Stomach otherwise unremarkable. Small bowel and colon are normal in caliber. No wall thickening. No evidence of a bowel injury or mesenteric hematoma. No inflammation. Scattered diverticula mostly along the left colon. No diverticulitis. Vascular/Lymphatic: No vascular injury. Aortic atherosclerosis. No aneurysm. No enlarged lymph nodes. Reproductive: Enlarged prostate, 5.8 x 4.8 cm transversely, stable. Other: Small fat containing inguinal hernias, right greater than left, stable. No ascites. Musculoskeletal: No acute fracture. Depression of the upper endplate of L3 by an apparent prominent Schmorl's node. There is sclerosis of the posterior elements, most evident involving the pedicle and facet on the left of L3. There are additional small sclerotic lesions. Sclerotic changes at L3 have progressed since the prior CT. IMPRESSION: 1. No acute injury to the chest, abdomen or pelvis. 2. Chronic bronchiectasis in the right lower lobe with peribronchovascular opacities consistent with atelectasis/scarring. Consider a component of acute bronchitis if there are consistent clinical findings. Trace bilateral pleural effusions. 3. Areas of skeletal sclerosis, most evident in the lumbar spine, consistent with blastic prostate carcinoma metastases. Sclerotic changes have progressed in the lumbar spine since the prior exam. 4. No current enlarged lymph nodes. Bulky  adenopathy noted on the prior abdomen pelvis CT is no longer visualized. 5. Chronic findings include aortic atherosclerosis, moderate hiatal hernia, liver cysts and colonic diverticula. 6. Stable prostate enlargement. Electronically Signed   By: Alm Parkins M.D.   On: 03/30/2024 15:11   CT Cervical Spine Wo Contrast Result Date: 03/30/2024 CLINICAL DATA:  Multiple falls, history of metastatic prostate cancer EXAM: CT CERVICAL SPINE WITHOUT CONTRAST  TECHNIQUE: Multidetector CT imaging of the cervical spine was performed without intravenous contrast. Multiplanar CT image reconstructions were also generated. RADIATION DOSE REDUCTION: This exam was performed according to the departmental dose-optimization program which includes automated exposure control, adjustment of the mA and/or kV according to patient size and/or use of iterative reconstruction technique. COMPARISON:  11/02/2022 FINDINGS: Alignment: Alignment is anatomic. Skull base and vertebrae: No acute displaced fracture. Chronic sclerosis of the T1 vertebral body compatible with known metastatic prostate cancer. No other destructive bony abnormalities. Soft tissues and spinal canal: No prevertebral fluid or swelling. No visible canal hematoma. Disc levels: Multilevel facet hypertrophic changes with bony fusion across the left C4-5 facet joint. There is partial bony fusion across the C4-5 disc space as well. Moderate spondylosis at C3-4. Upper chest: Airway is patent.  Lung apices are clear. Other: Reconstructed images demonstrate no additional findings. IMPRESSION: 1. No acute cervical spine fracture. 2. Stable multilevel cervical degenerative changes. 3. Stable sclerotic metastasis at the T1 vertebral body. Electronically Signed   By: Ozell Daring M.D.   On: 03/30/2024 15:02   CT Head Wo Contrast Result Date: 03/30/2024 CLINICAL DATA:  Multiple falls, head trauma EXAM: CT HEAD WITHOUT CONTRAST TECHNIQUE: Contiguous axial images were obtained from the base of the skull through the vertex without intravenous contrast. RADIATION DOSE REDUCTION: This exam was performed according to the departmental dose-optimization program which includes automated exposure control, adjustment of the mA and/or kV according to patient size and/or use of iterative reconstruction technique. COMPARISON:  11/02/2022 FINDINGS: Brain: Chronic small vessel ischemic changes are seen within the periventricular white matter and  bilateral thalami. No acute infarct or hemorrhage. Lateral ventricles and midline structures are unremarkable. No acute extra-axial fluid collections. No mass effect. Vascular: No hyperdense vessel or unexpected calcification. Skull: Normal. Negative for fracture or focal lesion. Sinuses/Orbits: No acute finding. Other: None. IMPRESSION: 1. No acute intracranial process. Electronically Signed   By: Ozell Daring M.D.   On: 03/30/2024 14:58   DG Shoulder Right Result Date: 03/30/2024 CLINICAL DATA:  Clemens yesterday afternoon, found this morning. Right shoulder pain. EXAM: RIGHT SHOULDER - 2+ VIEW COMPARISON:  None Available. FINDINGS: No fracture.  No bone lesion. Glenohumeral and AC joints are normally aligned. Mild AC joint osteoarthritis. Narrowed subacromial space suggests a chronic rotator cuff tear. Skeletal structures are demineralized. IMPRESSION: 1. No fracture or dislocation. Electronically Signed   By: Alm Parkins M.D.   On: 03/30/2024 14:39    Scheduled Meds:  abiraterone  acetate  1,000 mg Oral Daily   cyanocobalamin   1,000 mcg Oral Daily   enoxaparin  (LOVENOX ) injection  40 mg Subcutaneous Q24H   furosemide   20 mg Oral Daily   Continuous Infusions:  sodium chloride  100 mL/hr at 03/31/24 0600     LOS: 0 days   Ivonne Mustache, MD [facility] P11/28/2025, 8:08 AM

## 2024-04-01 DIAGNOSIS — M6282 Rhabdomyolysis: Secondary | ICD-10-CM | POA: Diagnosis not present

## 2024-04-01 LAB — CK: Total CK: 637 U/L — ABNORMAL HIGH (ref 49–397)

## 2024-04-01 MED ORDER — SENNOSIDES-DOCUSATE SODIUM 8.6-50 MG PO TABS
1.0000 | ORAL_TABLET | Freq: Two times a day (BID) | ORAL | Status: DC
  Administered 2024-04-01 – 2024-04-21 (×36): 1 via ORAL
  Filled 2024-04-01 (×37): qty 1

## 2024-04-01 MED ORDER — POLYETHYLENE GLYCOL 3350 17 G PO PACK
17.0000 g | PACK | Freq: Every day | ORAL | Status: DC
  Administered 2024-04-02 – 2024-04-21 (×14): 17 g via ORAL
  Filled 2024-04-01 (×15): qty 1

## 2024-04-01 NOTE — Progress Notes (Signed)
 This morning patient c/o fullness in stomach , bladder scanned him , saw 530 mls on scanner, straight cath and obtained 1200 mls with a few blood clots, no foul odor , he put out 450 mls yesterday by being straight cathd yesterday afternoon , and then only voided 100 mls overnight per report, updated provider on patient's status and instructed to bladder scan patient prn and follow up if patient does not void unassisted, patient urinated throughout the day afterwards in small amounts only retaining 50 mls, patient has poor appetite, fluids encouraged and tolerated well, given x 1 dose of Oxy this shift for pain with effective results, in bed resting, call light in reach

## 2024-04-01 NOTE — Progress Notes (Signed)
 PROGRESS NOTE  Alexander Rich  FMW:[id number] DOB: 02/10/1926 DOA: 03/30/2024 PCP: Seabron Lenis, MD   Brief Narrative: Patient is a 89 year old male with a history of upper GI bleed, duodenal ulcer, hypertension, metastatic prostate cancer to bone, recurrent falls who presented with a fall from home,weakness.  She lives alone.  Ambulates with the help of walker.  He felt weak and fell on the floor of the bathtub and could not get up for around 16 hours .on presentation he was hypertensive.  CK level was in the range of 800s.  Right shoulder x-ray did not show any fracture or dislocation.  CT head did not show any acute findings as well as CT cervical spine.  Showed multilevel cervical degenerative changes, stable sclerotic metastasis at the T1 vertebral body.  CT chest abdomen/pelvis did not show any acute findings.  Patient admitted for the management of generalized weakness, rhabdomyolysis.  Started on IV fluid.  PT consulted.  Recommended home health.  Patient states he does not want to live alone and wants to go to SNF for long-term .he lives alone.  TOC consulted.  PT consulted  Assessment & Plan:  Principal Problem:   Rhabdomyolysis Active Problems:   Prostate cancer metastatic to bone [facility])   Essential hypertension   Thrombocytopenia   Sinus bradycardia   Generalized weakness   B12 deficiency   Macrocytosis   Mild protein malnutrition   Falls   Generalized weakness/recurrent falls/rhabdomyolysis: Continue gentle IV fluid today .  Lives alone.  Ambulates with the help of walker.  He has 2 nieces.  He wants to go to SNF for long-term.  TOC consulted. PT recommended home health.  PT consulted.  I think he has high risk for fall at home and may be unsafe for him to be at home alone. I had a long discussion with his niece's  husband today.  He said himself and his niece are in the 18s and they are unable to take care of him anymore.  They are requesting if he can be placed in a  facility for long-term care  Hypertension:   Takes losartan  at home.  Restarted  Metastatic prostate cancer to bone: Continue Flomax   Sinus bradycardia: Stable.  Avoid AV nodal blocking agents  Thrombocytopenia: Stable.  Continue to monitor  History of vitamin B12 deficiency: Continue supplementation  Urinary retention: Continue Flomax .  He retains urine.  Will check bladder scan.  If he continues to retain, may need to place Foley catheter.          DVT prophylaxis:enoxaparin  (LOVENOX ) injection 40 mg Start: 03/30/24 2200     Code Status: Full Code  Family Communication: Called and discussed with his niece's husband on phone on 11/29  Patient status: Observation  Patient is from : Home  Anticipated discharge to: SNF   Estimated DC date: 1 to 2 days   Consultants: None  Procedures: None  Antimicrobials:  Anti-infectives (From admission, onward)    None       Subjective: Patient seen and examined at bedside today.  CK level improving with IV fluid.  New problem with the urine retention.  He had to be in and out.  Plan to check bladder scan again.  Has history of prostate cancer.  Abdomen is soft and nontender.  He again mentions about interest to go to a long-term facility  Objective: Vitals:   03/31/24 1410 03/31/24 2035 04/01/24 0539 04/01/24 0800  BP: (!) 120/53 (!) 124/57 (!) 150/77  Pulse: 77 76 85   Resp: (!) 24 20 18  (!) 21  Temp: 97.8 F (36.6 C) 98.1 F (36.7 C) 98.4 F (36.9 C)   TempSrc: Oral Oral Oral   SpO2: 99% 95% 96%   Weight:      Height:        Intake/Output Summary (Last 24 hours) at 04/01/2024 1055 Last data filed at 04/01/2024 9165 Gross per 24 hour  Intake 1791.25 ml  Output 1955 ml  Net -163.75 ml   Filed Weights   03/30/24 1823  Weight: 76.8 kg    Examination:  General exam: Overall comfortable, not in distress, pleasant elderly gentleman, chronically deconditioned HEENT: PERRL Respiratory system:  no wheezes  or crackles  Cardiovascular system: S1 & S2 heard, RRR.  Gastrointestinal system: Abdomen is nondistended, soft and nontender. Central nervous system: Alert and oriented Extremities: No edema, no clubbing ,no cyanosis Skin: No rashes, no ulcers,no icterus     Data Reviewed: I have personally reviewed following labs and imaging studies  CBC: Recent Labs  Lab 03/30/24 1215 03/31/24 0511  WBC 10.0 6.5  NEUTROABS 6.6  --   HGB 13.0 11.7*  HCT 40.1 36.8*  MCV 101.8* 103.4*  PLT 147* 120*   Basic Metabolic Panel: Recent Labs  Lab 03/30/24 1215 03/31/24 0511  NA 139 136  K 3.6 3.6  CL 108 107  CO2 22 20*  GLUCOSE 95 78  BUN 16 14  CREATININE 0.99 0.91  CALCIUM 8.3* 7.8*     Recent Results (from the past 240 hours)  Resp panel by RT-PCR (RSV, Flu A&B, Covid) Anterior Nasal Swab     Status: None   Collection Time: 03/30/24  1:23 PM   Specimen: Anterior Nasal Swab  Result Value Ref Range Status   SARS Coronavirus 2 by RT PCR NEGATIVE NEGATIVE Final    Comment: (NOTE) SARS-CoV-2 target nucleic acids are NOT DETECTED.  The SARS-CoV-2 RNA is generally detectable in upper respiratory specimens during the acute phase of infection. The lowest concentration of SARS-CoV-2 viral copies this assay can detect is 138 copies/mL. A negative result does not preclude SARS-Cov-2 infection and should not be used as the sole basis for treatment or other patient management decisions. A negative result may occur with  improper specimen collection/handling, submission of specimen other than nasopharyngeal swab, presence of viral mutation(s) within the areas targeted by this assay, and inadequate number of viral copies(<138 copies/mL). A negative result must be combined with clinical observations, patient history, and epidemiological information. The expected result is Negative.  Fact Sheet for Patients:  bloggercourse.com  Fact Sheet for Healthcare Providers:   seriousbroker.it  This test is no t yet approved or cleared by the [place]  FDA and  has been authorized for detection and/or diagnosis of SARS-CoV-2 by FDA under an Emergency Use Authorization (EUA). This EUA will remain  in effect (meaning this test can be used) for the duration of the COVID-19 declaration under Section 564(b)(1) of the Act, 21 U.S.C.section 360bbb-3(b)(1), unless the authorization is terminated  or revoked sooner.       Influenza A by PCR NEGATIVE NEGATIVE Final   Influenza B by PCR NEGATIVE NEGATIVE Final    Comment: (NOTE) The Xpert Xpress SARS-CoV-2/FLU/RSV plus assay is intended as an aid in the diagnosis of influenza from Nasopharyngeal swab specimens and should not be used as a sole basis for treatment. Nasal washings and aspirates are unacceptable for Xpert Xpress SARS-CoV-2/FLU/RSV testing.  Fact Sheet for Patients: bloggercourse.com  Fact Sheet for Healthcare Providers: seriousbroker.it  This test is not yet approved or cleared by the [place]  FDA and has been authorized for detection and/or diagnosis of SARS-CoV-2 by FDA under an Emergency Use Authorization (EUA). This EUA will remain in effect (meaning this test can be used) for the duration of the COVID-19 declaration under Section 564(b)(1) of the Act, 21 U.S.C. section 360bbb-3(b)(1), unless the authorization is terminated or revoked.     Resp Syncytial Virus by PCR NEGATIVE NEGATIVE Final    Comment: (NOTE) Fact Sheet for Patients: bloggercourse.com  Fact Sheet for Healthcare Providers: seriousbroker.it  This test is not yet approved or cleared by the [place]  FDA and has been authorized for detection and/or diagnosis of SARS-CoV-2 by FDA under an Emergency Use Authorization (EUA). This EUA will remain in effect (meaning this test can be used) for  the duration of the COVID-19 declaration under Section 564(b)(1) of the Act, 21 U.S.C. section 360bbb-3(b)(1), unless the authorization is terminated or revoked.  Performed at [facility], [address], [place], [place] [address]      Radiology Studies: CT CHEST ABDOMEN PELVIS W CONTRAST Result Date: 03/30/2024 CLINICAL DATA:  Repeated falls. EXAM: CT CHEST, ABDOMEN, AND PELVIS WITH CONTRAST TECHNIQUE: Multidetector CT imaging of the chest, abdomen and pelvis was performed following the standard protocol during bolus administration of intravenous contrast. RADIATION DOSE REDUCTION: This exam was performed according to the departmental dose-optimization program which includes automated exposure control, adjustment of the mA and/or kV according to patient size and/or use of iterative reconstruction technique. CONTRAST:  OMNIPAQUE  IOHEXOL  300 MG/ML  SOLN COMPARISON:  07/17/2022. FINDINGS: CT CHEST FINDINGS Cardiovascular: Heart normal in size. Three-vessel coronary artery calcifications. No pericardial effusion. Great vessels normal in caliber. Aortic atherosclerosis. Mediastinum/Nodes: No mediastinal hematoma. No neck base, mediastinal or hilar masses. No enlarged lymph nodes. Trachea esophagus are unremarkable. Lungs/Pleura: Trace pleural effusions. Bronchiectasis with associated Peri broncho vascular opacities in the right lower lobe. Linear type opacity noted at the base of the left lower lobe. Mild peripheral areas of interstitial thickening bilaterally. No lung mass or suspicious nodule. Scattered tiny stable nodules, benign. No pneumothorax. Musculoskeletal: No acute fracture. Sclerotic changes of the T1 vertebra, stable from the prior CT. No chest wall mass. CT ABDOMEN PELVIS FINDINGS Hepatobiliary: Liver normal in size and overall attenuation. Multiple liver cysts, largest measuring 7.8 cm, stable from the prior CT. Gallbladder is distended. Tiny stone in the dependent  gallbladder. No wall thickening or inflammation. No bile duct dilation. Pancreas: Partial fatty replacement of the pancreas. No pancreatic mass or inflammation. Spleen: Normal in size without focal abnormality. Adrenals/Urinary Tract: Normal adrenal glands. Kidneys normal in overall size, orientation and position with symmetric enhancement. Several renal cortical cysts, largest exophytic from the lateral lower pole the right kidney, 2.8 cm. No follow-up recommended. No stones. No hydronephrosis. Normal ureters. Normal bladder. Stomach/Bowel: Moderate hiatal hernia. Stomach otherwise unremarkable. Small bowel and colon are normal in caliber. No wall thickening. No evidence of a bowel injury or mesenteric hematoma. No inflammation. Scattered diverticula mostly along the left colon. No diverticulitis. Vascular/Lymphatic: No vascular injury. Aortic atherosclerosis. No aneurysm. No enlarged lymph nodes. Reproductive: Enlarged prostate, 5.8 x 4.8 cm transversely, stable. Other: Small fat containing inguinal hernias, right greater than left, stable. No ascites. Musculoskeletal: No acute fracture. Depression of the upper endplate of L3 by an apparent prominent Schmorl's node. There is sclerosis of the posterior elements, most evident involving the pedicle and facet  on the left of L3. There are additional small sclerotic lesions. Sclerotic changes at L3 have progressed since the prior CT. IMPRESSION: 1. No acute injury to the chest, abdomen or pelvis. 2. Chronic bronchiectasis in the right lower lobe with peribronchovascular opacities consistent with atelectasis/scarring. Consider a component of acute bronchitis if there are consistent clinical findings. Trace bilateral pleural effusions. 3. Areas of skeletal sclerosis, most evident in the lumbar spine, consistent with blastic prostate carcinoma metastases. Sclerotic changes have progressed in the lumbar spine since the prior exam. 4. No current enlarged lymph nodes. Bulky  adenopathy noted on the prior abdomen pelvis CT is no longer visualized. 5. Chronic findings include aortic atherosclerosis, moderate hiatal hernia, liver cysts and colonic diverticula. 6. Stable prostate enlargement. Electronically Signed   By: Alm Parkins M.D.   On: 03/30/2024 15:11   CT Cervical Spine Wo Contrast Result Date: 03/30/2024 CLINICAL DATA:  Multiple falls, history of metastatic prostate cancer EXAM: CT CERVICAL SPINE WITHOUT CONTRAST TECHNIQUE: Multidetector CT imaging of the cervical spine was performed without intravenous contrast. Multiplanar CT image reconstructions were also generated. RADIATION DOSE REDUCTION: This exam was performed according to the departmental dose-optimization program which includes automated exposure control, adjustment of the mA and/or kV according to patient size and/or use of iterative reconstruction technique. COMPARISON:  11/02/2022 FINDINGS: Alignment: Alignment is anatomic. Skull base and vertebrae: No acute displaced fracture. Chronic sclerosis of the T1 vertebral body compatible with known metastatic prostate cancer. No other destructive bony abnormalities. Soft tissues and spinal canal: No prevertebral fluid or swelling. No visible canal hematoma. Disc levels: Multilevel facet hypertrophic changes with bony fusion across the left C4-5 facet joint. There is partial bony fusion across the C4-5 disc space as well. Moderate spondylosis at C3-4. Upper chest: Airway is patent.  Lung apices are clear. Other: Reconstructed images demonstrate no additional findings. IMPRESSION: 1. No acute cervical spine fracture. 2. Stable multilevel cervical degenerative changes. 3. Stable sclerotic metastasis at the T1 vertebral body. Electronically Signed   By: Ozell Daring M.D.   On: 03/30/2024 15:02   CT Head Wo Contrast Result Date: 03/30/2024 CLINICAL DATA:  Multiple falls, head trauma EXAM: CT HEAD WITHOUT CONTRAST TECHNIQUE: Contiguous axial images were obtained from  the base of the skull through the vertex without intravenous contrast. RADIATION DOSE REDUCTION: This exam was performed according to the departmental dose-optimization program which includes automated exposure control, adjustment of the mA and/or kV according to patient size and/or use of iterative reconstruction technique. COMPARISON:  11/02/2022 FINDINGS: Brain: Chronic small vessel ischemic changes are seen within the periventricular white matter and bilateral thalami. No acute infarct or hemorrhage. Lateral ventricles and midline structures are unremarkable. No acute extra-axial fluid collections. No mass effect. Vascular: No hyperdense vessel or unexpected calcification. Skull: Normal. Negative for fracture or focal lesion. Sinuses/Orbits: No acute finding. Other: None. IMPRESSION: 1. No acute intracranial process. Electronically Signed   By: Ozell Daring M.D.   On: 03/30/2024 14:58    Scheduled Meds:  abiraterone  acetate  1,000 mg Oral Daily   cyanocobalamin   1,000 mcg Oral Daily   enoxaparin  (LOVENOX ) injection  40 mg Subcutaneous Q24H   losartan   100 mg Oral q AM   tamsulosin   0.4 mg Oral q AM   Continuous Infusions:  sodium chloride  75 mL/hr at 04/01/24 0325     LOS: 0 days   Ivonne Mustache, MD [facility] P11/29/2025, 10:55 AM

## 2024-04-01 NOTE — Plan of Care (Signed)

## 2024-04-01 NOTE — Plan of Care (Signed)
   Problem: Coping: Goal: Level of anxiety will decrease Outcome: Progressing   Problem: Pain Managment: Goal: General experience of comfort will improve and/or be controlled Outcome: Progressing

## 2024-04-02 DIAGNOSIS — M6282 Rhabdomyolysis: Secondary | ICD-10-CM | POA: Diagnosis not present

## 2024-04-02 LAB — CK: Total CK: 426 U/L — ABNORMAL HIGH (ref 49–397)

## 2024-04-02 MED ORDER — OXYCODONE HCL 5 MG PO TABS
5.0000 mg | ORAL_TABLET | Freq: Four times a day (QID) | ORAL | Status: DC | PRN
  Administered 2024-04-02 – 2024-04-10 (×17): 10 mg via ORAL
  Administered 2024-04-10: 5 mg via ORAL
  Administered 2024-04-12: 10 mg via ORAL
  Administered 2024-04-12: 5 mg via ORAL
  Administered 2024-04-12 – 2024-04-16 (×9): 10 mg via ORAL
  Administered 2024-04-17 (×2): 5 mg via ORAL
  Administered 2024-04-18: 18:00:00 10 mg via ORAL
  Administered 2024-04-18 (×2): 5 mg via ORAL
  Administered 2024-04-19 – 2024-04-20 (×5): 10 mg via ORAL
  Filled 2024-04-02: qty 2
  Filled 2024-04-02: qty 1
  Filled 2024-04-02 (×10): qty 2
  Filled 2024-04-02: qty 1
  Filled 2024-04-02 (×2): qty 2
  Filled 2024-04-02: qty 1
  Filled 2024-04-02 (×3): qty 2
  Filled 2024-04-02: qty 1
  Filled 2024-04-02 (×14): qty 2
  Filled 2024-04-02: qty 1
  Filled 2024-04-02 (×4): qty 2
  Filled 2024-04-02: qty 1
  Filled 2024-04-02 (×3): qty 2

## 2024-04-02 MED ORDER — OXYCODONE HCL 5 MG PO TABS: 5.0000 mg | ORAL_TABLET | Freq: Four times a day (QID) | ORAL | Status: DC | PRN

## 2024-04-02 NOTE — Plan of Care (Signed)
  Problem: Education: Goal: Knowledge of General Education information will improve Description: Including pain rating scale, medication(s)/side effects and non-pharmacologic comfort measures Outcome: Progressing   Problem: Clinical Measurements: Goal: Ability to maintain clinical measurements within normal limits will improve Outcome: Progressing Goal: Will remain free from infection Outcome: Progressing Goal: Diagnostic test results will improve Outcome: Progressing   Problem: Activity: Goal: Risk for activity intolerance will decrease Outcome: Progressing   Problem: Nutrition: Goal: Adequate nutrition will be maintained Outcome: Progressing   Problem: Coping: Goal: Level of anxiety will decrease Outcome: Progressing   Problem: Elimination: Goal: Will not experience complications related to urinary retention Outcome: Progressing   Problem: Safety: Goal: Ability to remain free from injury will improve Outcome: Progressing   Problem: Skin Integrity: Goal: Risk for impaired skin integrity will decrease Outcome: Progressing

## 2024-04-02 NOTE — Progress Notes (Signed)
 PROGRESS NOTE  Alexander Rich  FMW:[id number] DOB: [birth year] DOA: 03/30/2024 PCP: Seabron Lenis, MD   Brief Narrative: Patient is a 89 year old male with a history of upper GI bleed, duodenal ulcer, hypertension, metastatic prostate cancer to bone, recurrent falls who presented with a fall from home,weakness.  She lives alone.  Ambulates with the help of walker.  He felt weak and fell on the floor of the bathtub and could not get up for around 16 hours .on presentation he was hypertensive.  CK level was in the range of 800s.  Right shoulder x-ray did not show any fracture or dislocation.  CT head did not show any acute findings as well as CT cervical spine.  Showed multilevel cervical degenerative changes, stable sclerotic metastasis at the T1 vertebral body.  CT chest abdomen/pelvis did not show any acute findings.  Patient admitted for the management of generalized weakness, rhabdomyolysis.  Started on IV fluid.  PT consulted.  Recommended home health.  Patient states he does not want to live alone and wants to go to SNF for long-term .he lives alone.  TOC consulted.  PT reconsulted  Assessment & Plan:  Principal Problem:   Rhabdomyolysis Active Problems:   Prostate cancer metastatic to bone [facility])   Essential hypertension   Thrombocytopenia   Sinus bradycardia   Generalized weakness   B12 deficiency   Macrocytosis   Mild protein malnutrition   Falls   Generalized weakness/recurrent falls/rhabdomyolysis: Continue gentle IV fluid today .  Lives alone.  Ambulates with the help of walker.  He has 2 nieces.  He wants to go to SNF for long-term.  TOC consulted. PT recommended home health.  PT consulted.  I think he has high risk for fall at home and may be unsafe for him to be at home alone. I had a long discussion with his niece's  husband on 11/29.  He said himself and his niece are in the 22s and they are unable to take care of him anymore.  They are requesting if he can be placed in a  facility for long-term care  Hypertension:   Takes losartan  at home.  Restarted  Metastatic prostate cancer to bone: Continue Flomax   Sinus bradycardia: Stable.  Avoid AV nodal blocking agents  Thrombocytopenia: Stable.  Continue to monitor  History of vitamin B12 deficiency: Continue supplementation  Urinary retention: Continue Flomax . Now voiding  Constipation: Continue bowel regimen          DVT prophylaxis:enoxaparin  (LOVENOX ) injection 40 mg Start: 03/30/24 2200     Code Status: Full Code  Family Communication: Called and discussed with his niece's husband on phone on 11/29  Patient status: Observation  Patient is from : Home  Anticipated discharge to: SNF   Estimated DC date: Whenever possible   Consultants: None  Procedures: None  Antimicrobials:  Anti-infectives (From admission, onward)    None       Subjective: Patient seen and examined at bedside today.  Sitting in the chair today.  On room air.  Voiding since yesterday.  No new complaints.  Appears weak and deconditioned but overall comfortable  Objective: Vitals:   04/01/24 0800 04/01/24 1427 04/01/24 1928 04/02/24 0529  BP:  129/73 (!) 130/59 (!) 159/78  Pulse:  69 66 66  Resp: (!) 21 20 14 18   Temp:  98.7 F (37.1 C) 98.5 F (36.9 C) 98.3 F (36.8 C)  TempSrc:  Oral Oral Oral  SpO2:  100% 99% 100%  Weight:  Height:        Intake/Output Summary (Last 24 hours) at 04/02/2024 1013 Last data filed at 04/02/2024 0500 Gross per 24 hour  Intake 931.25 ml  Output 1250 ml  Net -318.75 ml   Filed Weights   03/30/24 1823  Weight: 76.8 kg    Examination:  General exam: Overall comfortable, not in distress, pleasant elderly gentleman, chronically deconditioned HEENT: PERRL Respiratory system:  no wheezes or crackles  Cardiovascular system: S1 & S2 heard, RRR.  Gastrointestinal system: Abdomen is nondistended, soft and nontender. Central nervous system: Alert and  oriented Extremities: No edema, no clubbing ,no cyanosis Skin: No rashes, no ulcers,no icterus      Data Reviewed: I have personally reviewed following labs and imaging studies  CBC: Recent Labs  Lab 03/30/24 1215 03/31/24 0511  WBC 10.0 6.5  NEUTROABS 6.6  --   HGB 13.0 11.7*  HCT 40.1 36.8*  MCV 101.8* 103.4*  PLT 147* 120*   Basic Metabolic Panel: Recent Labs  Lab 03/30/24 1215 03/31/24 0511  NA 139 136  K 3.6 3.6  CL 108 107  CO2 22 20*  GLUCOSE 95 78  BUN 16 14  CREATININE 0.99 0.91  CALCIUM 8.3* 7.8*     Recent Results (from the past 240 hours)  Resp panel by RT-PCR (RSV, Flu A&B, Covid) Anterior Nasal Swab     Status: None   Collection Time: 03/30/24  1:23 PM   Specimen: Anterior Nasal Swab  Result Value Ref Range Status   SARS Coronavirus 2 by RT PCR NEGATIVE NEGATIVE Final    Comment: (NOTE) SARS-CoV-2 target nucleic acids are NOT DETECTED.  The SARS-CoV-2 RNA is generally detectable in upper respiratory specimens during the acute phase of infection. The lowest concentration of SARS-CoV-2 viral copies this assay can detect is 138 copies/mL. A negative result does not preclude SARS-Cov-2 infection and should not be used as the sole basis for treatment or other patient management decisions. A negative result may occur with  improper specimen collection/handling, submission of specimen other than nasopharyngeal swab, presence of viral mutation(s) within the areas targeted by this assay, and inadequate number of viral copies(<138 copies/mL). A negative result must be combined with clinical observations, patient history, and epidemiological information. The expected result is Negative.  Fact Sheet for Patients:  bloggercourse.com  Fact Sheet for Healthcare Providers:  seriousbroker.it  This test is no t yet approved or cleared by the [place]  FDA and  has been authorized for detection and/or  diagnosis of SARS-CoV-2 by FDA under an Emergency Use Authorization (EUA). This EUA will remain  in effect (meaning this test can be used) for the duration of the COVID-19 declaration under Section 564(b)(1) of the Act, 21 U.S.C.section 360bbb-3(b)(1), unless the authorization is terminated  or revoked sooner.       Influenza A by PCR NEGATIVE NEGATIVE Final   Influenza B by PCR NEGATIVE NEGATIVE Final    Comment: (NOTE) The Xpert Xpress SARS-CoV-2/FLU/RSV plus assay is intended as an aid in the diagnosis of influenza from Nasopharyngeal swab specimens and should not be used as a sole basis for treatment. Nasal washings and aspirates are unacceptable for Xpert Xpress SARS-CoV-2/FLU/RSV testing.  Fact Sheet for Patients: bloggercourse.com  Fact Sheet for Healthcare Providers: seriousbroker.it  This test is not yet approved or cleared by the [place]  FDA and has been authorized for detection and/or diagnosis of SARS-CoV-2 by FDA under an Emergency Use Authorization (EUA). This EUA will remain in effect (meaning  this test can be used) for the duration of the COVID-19 declaration under Section 564(b)(1) of the Act, 21 U.S.C. section 360bbb-3(b)(1), unless the authorization is terminated or revoked.     Resp Syncytial Virus by PCR NEGATIVE NEGATIVE Final    Comment: (NOTE) Fact Sheet for Patients: bloggercourse.com  Fact Sheet for Healthcare Providers: seriousbroker.it  This test is not yet approved or cleared by the [place]  FDA and has been authorized for detection and/or diagnosis of SARS-CoV-2 by FDA under an Emergency Use Authorization (EUA). This EUA will remain in effect (meaning this test can be used) for the duration of the COVID-19 declaration under Section 564(b)(1) of the Act, 21 U.S.C. section 360bbb-3(b)(1), unless the authorization is terminated  or revoked.  Performed at [facility], [address], [place], [place] [address]      Radiology Studies: No results found.   Scheduled Meds:  abiraterone  acetate  1,000 mg Oral Daily   cyanocobalamin   1,000 mcg Oral Daily   enoxaparin  (LOVENOX ) injection  40 mg Subcutaneous Q24H   losartan   100 mg Oral q AM   polyethylene glycol  17 g Oral Daily   senna-docusate  1 tablet Oral BID   tamsulosin   0.4 mg Oral q AM   Continuous Infusions:     LOS: 0 days   Ivonne Mustache, MD [facility] P11/30/2025, 10:13 AM

## 2024-04-02 NOTE — Progress Notes (Signed)
 Physical Therapy Treatment Patient Details Name: Alexander Rich MRN: [id number] DOB: [birth year] Today's Date: 04/02/2024   History of Present Illness 89 yo male admitted to hospital on 03/30/2024 due to 2 falls secondary to progressive weakness, pt found to have rhabdomyolysis. 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    PT Comments  Pt agreeable to working with therapy. Per chart review. MD recommending and pt/family are requesting placement. Pt states he does not wish to live at home alone any longer. Family reports, per chart review, that they are unable to care for him and they think he needs LTC placement. Pt expresses concerns about being able to care for himself as well. He reported feeling a bit short of breath on today. He mobilized fairly well during session-Min A for bed mobility and CGA for transfers, ambulation with RW. Regarding LTC placement--recommend [facility] consult. Will update recommendation to ST SNF per pt request. Will also ask OT to evaluate pt's ability to perform ADLs.    If plan is discharge home, recommend the following: A little help with walking and/or transfers;A little help with bathing/dressing/bathroom;Assistance with cooking/housework;Assist for transportation;Help with stairs or ramp for entrance   Can travel by private vehicle        Equipment Recommendations  None recommended by PT    Recommendations for Other Services       Precautions / Restrictions Precautions Precautions: Fall Restrictions Weight Bearing Restrictions Per Provider Order: No     Mobility  Bed Mobility Overal bed mobility: Needs Assistance Bed Mobility: Supine to Sit, Sit to Supine     Supine to sit: Min assist, HOB elevated Sit to supine: Supervision   General bed mobility comments: Assist for trunk to upright provided at pt's request after having difficulty  getting to EOB. HOB elevated ~30 degrees.    Transfers Overall transfer level: Needs assistance Equipment used: Rolling walker (2 wheels) Transfers: Sit to/from Stand Sit to Stand: Contact guard assist           General transfer comment: Cues for safety, hand placement.    Ambulation/Gait Ambulation/Gait assistance: Contact guard assist Gait Distance (Feet): 120 Feet Assistive device: Rolling walker (2 wheels) Gait Pattern/deviations: Step-through pattern, Decreased stride length       General Gait Details: slight trunk flexion. cues for safety. pt tolerated distance well.   Stairs             Wheelchair Mobility     Tilt Bed    Modified Rankin (Stroke Patients Only)       Balance Overall balance assessment: Needs assistance         Standing balance support: Bilateral upper extremity supported, During functional activity, Reliant on assistive device for balance Standing balance-Leahy Scale: Poor                              Communication Communication Communication: No apparent difficulties  Cognition Arousal: Alert Behavior During Therapy: WFL for tasks assessed/performed                             Following commands: Intact      Cueing Cueing Techniques: Verbal cues  Exercises      General Comments        Pertinent Vitals/Pain Pain Assessment Pain Assessment: No/denies pain  Home Living                          Prior Function            PT Goals (current goals can now be found in the care plan section) Progress towards PT goals: Progressing toward goals    Frequency    Min 2X/week      PT Plan      Co-evaluation              AM-PAC PT 6 Clicks Mobility   Outcome Measure  Help needed turning from your back to your side while in a flat bed without using bedrails?: None Help needed moving from lying on your back to sitting on the side of a flat bed without using  bedrails?: A Little Help needed moving to and from a bed to a chair (including a wheelchair)?: A Little Help needed standing up from a chair using your arms (e.g., wheelchair or bedside chair)?: A Little Help needed to walk in hospital room?: A Little Help needed climbing 3-5 steps with a railing? : A Lot 6 Click Score: 18    End of Session Equipment Utilized During Treatment: Gait belt Activity Tolerance: Patient tolerated treatment well Patient left: in bed;with call bell/phone within reach;with bed alarm set   PT Visit Diagnosis: Unsteadiness on feet (R26.81);Other abnormalities of gait and mobility (R26.89);Repeated falls (R29.6);Muscle weakness (generalized) (M62.81);Difficulty in walking, not elsewhere classified (R26.2)     Time: 9040-8983 PT Time Calculation (min) (ACUTE ONLY): 17 min  Charges:    $Gait Training: 8-22 mins PT General Charges $$ ACUTE PT VISIT: 1 Visit                         Dannial SQUIBB, PT Acute Rehabilitation  Office: [phone]

## 2024-04-03 DIAGNOSIS — M6282 Rhabdomyolysis: Secondary | ICD-10-CM | POA: Diagnosis not present

## 2024-04-03 LAB — BASIC METABOLIC PANEL WITH GFR
Anion gap: 8 (ref 5–15)
BUN: 11 mg/dL (ref 8–23)
CO2: 20 mmol/L — ABNORMAL LOW (ref 22–32)
Calcium: 8 mg/dL — ABNORMAL LOW (ref 8.9–10.3)
Chloride: 109 mmol/L (ref 98–111)
Creatinine, Ser: 1.04 mg/dL (ref 0.61–1.24)
GFR, Estimated: >60 mL/min (ref >60)
Glucose, Bld: 109 mg/dL — ABNORMAL HIGH (ref 70–99)
Potassium: 3.5 mmol/L (ref 3.5–5.1)
Sodium: 137 mmol/L (ref 135–145)

## 2024-04-03 LAB — CBC
HCT: 31.4 % — ABNORMAL LOW (ref 39.0–52.0)
Hemoglobin: 9.8 g/dL — ABNORMAL LOW (ref 13.0–17.0)
MCH: 33.2 pg (ref 26.0–34.0)
MCHC: 31.2 g/dL (ref 30.0–36.0)
MCV: 106.4 fL — ABNORMAL HIGH (ref 80.0–100.0)
Platelets: 114 K/uL — ABNORMAL LOW (ref 150–400)
RBC: 2.95 MIL/uL — ABNORMAL LOW (ref 4.22–5.81)
RDW: 13.9 % (ref 11.5–15.5)
WBC: 11.3 K/uL — ABNORMAL HIGH (ref 4.0–10.5)
nRBC: 0 % (ref 0.0–0.2)

## 2024-04-03 MED ORDER — ENSURE PLUS HIGH PROTEIN PO LIQD
237.0000 mL | Freq: Two times a day (BID) | ORAL | Status: DC
  Administered 2024-04-03 – 2024-04-06 (×5): 237 mL via ORAL

## 2024-04-03 MED ORDER — SODIUM CHLORIDE 0.9 % IV SOLN
INTRAVENOUS | Status: DC
  Administered 2024-04-03: 1000 mL via INTRAVENOUS

## 2024-04-03 MED ORDER — ADULT MULTIVITAMIN W/MINERALS CH
1.0000 | ORAL_TABLET | Freq: Every day | ORAL | Status: DC
  Administered 2024-04-03 – 2024-04-21 (×17): 1 via ORAL
  Filled 2024-04-03 (×18): qty 1

## 2024-04-03 NOTE — Progress Notes (Signed)
 PROGRESS NOTE  JARID SASSO  FMW:[id number] DOB: [birth year] DOA: 03/30/2024 PCP: Seabron Lenis, MD   Brief Narrative: Patient is a 89 year old male with a history of upper GI bleed, duodenal ulcer, hypertension, metastatic prostate cancer to bone, recurrent falls who presented with a fall from home,weakness.  She lives alone.  Ambulates with the help of walker.  He felt weak and fell on the floor of the bathtub and could not get up for around 16 hours .on presentation he was hypertensive.  CK level was in the range of 800s.  Right shoulder x-ray did not show any fracture or dislocation.  CT head did not show any acute findings as well as CT cervical spine.  Showed multilevel cervical degenerative changes, stable sclerotic metastasis at the T1 vertebral body.  CT chest abdomen/pelvis did not show any acute findings.  Patient admitted for the management of generalized weakness, rhabdomyolysis.  Started on IV fluid.  PT consulted.  Recommended home health.  Patient states he does not want to live alone and wants to go to SNF for long-term .he lives alone.  TOC consulted.  PT reconsulted.  Medically stable for discharge to SnF  Assessment & Plan:  Principal Problem:   Rhabdomyolysis Active Problems:   Prostate cancer metastatic to bone [facility])   Essential hypertension   Thrombocytopenia   Sinus bradycardia   Generalized weakness   B12 deficiency   Macrocytosis   Mild protein malnutrition   Falls   Generalized weakness/recurrent falls/rhabdomyolysis: Continue gentle IV fluid today .  Lives alone.  Ambulates with the help of walker.  He has 2 nieces.  He wants to go to SNF for long-term.  TOC consulted. PT recommended home health.  PT consulted.  I think he has high risk for fall at home and may be unsafe for him to be at home alone. I had a long discussion with his niece's  husband on 11/29.  He said himself and his niece are in the 84s and they are unable to take care of him anymore.  They  are requesting if he can be placed in a facility for long-term care.  PT recommended SNF.  Hypertension:   Takes losartan  at home.  Restarted  Metastatic prostate cancer to bone: Continue Flomax   Sinus bradycardia: Stable.  Avoid AV nodal blocking agents  Thrombocytopenia: Stable.  Continue to monitor  History of vitamin B12 deficiency: Continue supplementation  Urinary retention: Continue Flomax . Now voiding  Constipation: Continue bowel regimen  Chronic back pain: On oxycodone  at home          DVT prophylaxis:enoxaparin  (LOVENOX ) injection 40 mg Start: 03/30/24 2200     Code Status: Full Code  Family Communication: Called and discussed with his niece's husband on phone on 11/29  Patient status: Observation  Patient is from : Home  Anticipated discharge to: SNF   Estimated DC date: Whenever possible   Consultants: None  Procedures: None  Antimicrobials:  Anti-infectives (From admission, onward)    None       Subjective: Patient seen and examined at bedside today.  Appears sleepy / drowsy but not in any kind of distress.  Denies any new complaints.  Overall comfortable.  Lying on bed.  Complains of back pain  Objective: Vitals:   04/02/24 0529 04/02/24 1356 04/02/24 2059 04/03/24 0638  BP: (!) 159/78 (!) 149/87 (!) 117/55 (!) 125/56  Pulse: 66 73 81 70  Resp: 18 20 20    Temp: 98.3 F (36.8 C) 99.2 F (  37.3 C) 99.6 F (37.6 C) 99.4 F (37.4 C)  TempSrc: Oral Oral Oral Oral  SpO2: 100% 91% (!) 89% 99%  Weight:      Height:        Intake/Output Summary (Last 24 hours) at 04/03/2024 1139 Last data filed at 04/03/2024 9666 Gross per 24 hour  Intake 240 ml  Output 25 ml  Net 215 ml   Filed Weights   03/30/24 1823  Weight: 76.8 kg    Examination:      General exam: Overall comfortable, not in distress, pleasant elderly gentleman, chronically deconditioned HEENT: PERRL Respiratory system:  no wheezes or crackles  Cardiovascular system:  S1 & S2 heard, RRR.  Gastrointestinal system: Abdomen is nondistended, soft and nontender. Central nervous system: Alert and oriented Extremities: No edema, no clubbing ,no cyanosis Skin: No rashes, no ulcers,no icterus      Data Reviewed: I have personally reviewed following labs and imaging studies  CBC: Recent Labs  Lab 03/30/24 1215 03/31/24 0511 04/03/24 0444  WBC 10.0 6.5 11.3*  NEUTROABS 6.6  --   --   HGB 13.0 11.7* 9.8*  HCT 40.1 36.8* 31.4*  MCV 101.8* 103.4* 106.4*  PLT 147* 120* 114*   Basic Metabolic Panel: Recent Labs  Lab 03/30/24 1215 03/31/24 0511 04/03/24 0444  NA 139 136 137  K 3.6 3.6 3.5  CL 108 107 109  CO2 22 20* 20*  GLUCOSE 95 78 109*  BUN 16 14 11   CREATININE 0.99 0.91 1.04  CALCIUM 8.3* 7.8* 8.0*     Recent Results (from the past 240 hours)  Resp panel by RT-PCR (RSV, Flu A&B, Covid) Anterior Nasal Swab     Status: None   Collection Time: 03/30/24  1:23 PM   Specimen: Anterior Nasal Swab  Result Value Ref Range Status   SARS Coronavirus 2 by RT PCR NEGATIVE NEGATIVE Final    Comment: (NOTE) SARS-CoV-2 target nucleic acids are NOT DETECTED.  The SARS-CoV-2 RNA is generally detectable in upper respiratory specimens during the acute phase of infection. The lowest concentration of SARS-CoV-2 viral copies this assay can detect is 138 copies/mL. A negative result does not preclude SARS-Cov-2 infection and should not be used as the sole basis for treatment or other patient management decisions. A negative result may occur with  improper specimen collection/handling, submission of specimen other than nasopharyngeal swab, presence of viral mutation(s) within the areas targeted by this assay, and inadequate number of viral copies(<138 copies/mL). A negative result must be combined with clinical observations, patient history, and epidemiological information. The expected result is Negative.  Fact Sheet for Patients:   bloggercourse.com  Fact Sheet for Healthcare Providers:  seriousbroker.it  This test is no t yet approved or cleared by the [place]  FDA and  has been authorized for detection and/or diagnosis of SARS-CoV-2 by FDA under an Emergency Use Authorization (EUA). This EUA will remain  in effect (meaning this test can be used) for the duration of the COVID-19 declaration under Section 564(b)(1) of the Act, 21 U.S.C.section 360bbb-3(b)(1), unless the authorization is terminated  or revoked sooner.       Influenza A by PCR NEGATIVE NEGATIVE Final   Influenza B by PCR NEGATIVE NEGATIVE Final    Comment: (NOTE) The Xpert Xpress SARS-CoV-2/FLU/RSV plus assay is intended as an aid in the diagnosis of influenza from Nasopharyngeal swab specimens and should not be used as a sole basis for treatment. Nasal washings and aspirates are unacceptable for Xpert Xpress  SARS-CoV-2/FLU/RSV testing.  Fact Sheet for Patients: bloggercourse.com  Fact Sheet for Healthcare Providers: seriousbroker.it  This test is not yet approved or cleared by the [place]  FDA and has been authorized for detection and/or diagnosis of SARS-CoV-2 by FDA under an Emergency Use Authorization (EUA). This EUA will remain in effect (meaning this test can be used) for the duration of the COVID-19 declaration under Section 564(b)(1) of the Act, 21 U.S.C. section 360bbb-3(b)(1), unless the authorization is terminated or revoked.     Resp Syncytial Virus by PCR NEGATIVE NEGATIVE Final    Comment: (NOTE) Fact Sheet for Patients: bloggercourse.com  Fact Sheet for Healthcare Providers: seriousbroker.it  This test is not yet approved or cleared by the [place]  FDA and has been authorized for detection and/or diagnosis of SARS-CoV-2 by FDA under an Emergency Use  Authorization (EUA). This EUA will remain in effect (meaning this test can be used) for the duration of the COVID-19 declaration under Section 564(b)(1) of the Act, 21 U.S.C. section 360bbb-3(b)(1), unless the authorization is terminated or revoked.  Performed at [facility], [address] [place], [place] [address]      Radiology Studies: No results found.   Scheduled Meds:  abiraterone  acetate  1,000 mg Oral Daily   cyanocobalamin   1,000 mcg Oral Daily   enoxaparin  (LOVENOX ) injection  40 mg Subcutaneous Q24H   feeding supplement  237 mL Oral BID BM   losartan   100 mg Oral q AM   multivitamin with minerals  1 tablet Oral Daily   polyethylene glycol  17 g Oral Daily   senna-docusate  1 tablet Oral BID   tamsulosin   0.4 mg Oral q AM   Continuous Infusions:     LOS: 0 days   Ivonne Mustache, MD [facility] P12/05/2023, 11:39 AM

## 2024-04-03 NOTE — [place] FL2 (Signed)
 [place]  MEDICAID FL2 LEVEL OF CARE FORM     IDENTIFICATION  Patient Name: Alexander Rich Birthdate: Sep 30, 1925 Sex: male Admission Date (Current Location): 03/30/2024  [facility] and [place] Number:  Producer, Television/film/video and Address:  [facility],  [address] [place], [place] [address]      Provider Number: [id number]  Attending Physician Name and Address:  Jillian Buttery, MD  Relative Name and Phone Number:  [name] [phone]    Current Level of Care: Hospital Recommended Level of Care: Skilled Nursing Facility Prior Approval Number:    Date Approved/Denied: 08/18/16 PASRR Number: [id number]  Discharge Plan: SNF    Current Diagnoses: Patient Active Problem List   Diagnosis Date Noted   Rhabdomyolysis 03/30/2024   Macrocytosis 03/30/2024   Mild protein malnutrition 03/30/2024   Duodenal ulcer 03/30/2024   Cardiac arrhythmia 03/30/2024   Chronic idiopathic constipation 03/30/2024   Mixed hyperlipidemia 03/30/2024   Odynophagia 03/30/2024   Vitamin D deficiency 03/30/2024   Falls 03/30/2024   B12 deficiency 12/02/2022   Fall at home, initial encounter 11/03/2022   COVID-19 virus infection 11/03/2022   Dehydration 11/03/2022   Acute prerenal azotemia 11/03/2022   Generalized weakness 11/02/2022   Sigmoid thickening 07/18/2022   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   Prostate enlargement 07/16/2022   Bilateral lower extremity edema 07/14/2022   History of duodenal ulcer 03/25/2022   Primary localized osteoarthritis of left hip 03/24/2022   Benign prostatic hyperplasia without urinary obstruction 03/22/2022   Sinus bradycardia 03/22/2022   Acute upper gastrointestinal bleeding 03/21/2022   Leukocytosis 03/21/2022   Macrocytic anemia 03/21/2022   Essential hypertension 03/21/2022   Hyperpigmented skin lesions 03/21/2022   Thrombocytopenia 03/21/2022   Pain of left hip joint 11/17/2021   Osteoarthritis of knee 02/17/2021     Orientation RESPIRATION BLADDER Height & Weight     Self, Time, Situation, Place  Normal Incontinent Weight: 76.8 kg Height:  5' 7 (170.2 cm)  BEHAVIORAL SYMPTOMS/MOOD NEUROLOGICAL BOWEL NUTRITION STATUS      Continent Diet (Heart Healthy Diet)  AMBULATORY STATUS COMMUNICATION OF NEEDS Skin   Limited Assist Verbally Skin abrasions, Other (Comment) (skin abrasions/redness noted to left upper arm / wrist)                       Personal Care Assistance Level of Assistance  Bathing, Feeding, Dressing Bathing Assistance: Limited assistance Feeding assistance: Limited assistance Dressing Assistance: Limited assistance     Functional Limitations Info  Sight, Hearing, Speech Sight Info: Impaired (eyeglasses) Hearing Info: Adequate Speech Info: Adequate    SPECIAL CARE FACTORS FREQUENCY  PT (By licensed PT), OT (By licensed OT)     PT Frequency: 5x/wk OT Frequency: 5x/wk            Contractures Contractures Info: Not present    Additional Factors Info  Code Status, Allergies, Psychotropic, Insulin Sliding Scale, Isolation Precautions, Suctioning Needs Code Status Info: Full Allergies Info: Ibuprofen, Nebivolol Hcl, Pravastatin, Sertraline Hcl, Simvastatin, Trazodone Psychotropic Info: n/a see discharge summary Insulin Sliding Scale Info: see discharge summary Isolation Precautions Info: n/a Suctioning Needs: n/a   Current Medications (04/03/2024):  This is the current hospital active medication list Current Facility-Administered Medications  Medication Dose Route Frequency Provider Last Rate Last Admin   abiraterone  acetate (ZYTIGA ) tablet 1,000 mg  1,000 mg Oral Daily Celinda Alm Lot, MD       acetaminophen  (TYLENOL ) tablet 650 mg  650 mg Oral Q6H PRN  Celinda Alm Lot, MD   650 mg at 04/02/24 1314   Or   acetaminophen  (TYLENOL ) suppository 650 mg  650 mg Rectal Q6H PRN Celinda Alm Lot, MD       artificial tears ophthalmic solution 1 drop  1 drop Both  Eyes PRN Celinda Alm Lot, MD   1 drop at 04/02/24 1028   cyanocobalamin  (VITAMIN B12) tablet 1,000 mcg  1,000 mcg Oral Daily Celinda Alm Lot, MD   1,000 mcg at 04/03/24 1040   enoxaparin  (LOVENOX ) injection 40 mg  40 mg Subcutaneous Q24H Celinda Alm Lot, MD   40 mg at 04/02/24 2100   feeding supplement (ENSURE PLUS HIGH PROTEIN) liquid 237 mL  237 mL Oral BID BM Adhikari, Amrit, MD   237 mL at 04/03/24 1044   losartan  (COZAAR ) tablet 100 mg  100 mg Oral q AM Jillian Buttery, MD   100 mg at 04/03/24 9487   multivitamin with minerals tablet 1 tablet  1 tablet Oral Daily Jillian Buttery, MD   1 tablet at 04/03/24 1044   ondansetron  (ZOFRAN ) tablet 4 mg  4 mg Oral Q6H PRN Celinda Alm Lot, MD       Or   ondansetron  (ZOFRAN ) injection 4 mg  4 mg Intravenous Q6H PRN Celinda Alm Lot, MD       oxyCODONE  (Oxy IR/ROXICODONE ) immediate release tablet 5-10 mg  5-10 mg Oral Q6H PRN Adhikari, Amrit, MD   10 mg at 04/03/24 0511   polyethylene glycol (MIRALAX  / GLYCOLAX ) packet 17 g  17 g Oral Daily Jillian Buttery, MD   17 g at 04/03/24 1039   senna-docusate (Senokot-S) tablet 1 tablet  1 tablet Oral BID Adhikari, Amrit, MD   1 tablet at 04/03/24 1039   tamsulosin  (FLOMAX ) capsule 0.4 mg  0.4 mg Oral q AM Jillian Buttery, MD   0.4 mg at 04/03/24 9487     Discharge Medications: Please see discharge summary for a list of discharge medications.  Relevant Imaging Results:  Relevant Lab Results:   Additional Information SS# [id number]  Toy LITTIE Agar, RN

## 2024-04-03 NOTE — Plan of Care (Signed)
  Problem: Clinical Measurements: Goal: Ability to maintain clinical measurements within normal limits will improve Outcome: Progressing Goal: Will remain free from infection Outcome: Progressing Goal: Diagnostic test results will improve Outcome: Progressing Goal: Cardiovascular complication will be avoided Outcome: Progressing   Problem: Activity: Goal: Risk for activity intolerance will decrease Outcome: Progressing   Problem: Nutrition: Goal: Adequate nutrition will be maintained Outcome: Progressing   Problem: Coping: Goal: Level of anxiety will decrease Outcome: Progressing   Problem: Elimination: Goal: Will not experience complications related to bowel motility Outcome: Progressing Goal: Will not experience complications related to urinary retention Outcome: Progressing   Problem: Pain Managment: Goal: General experience of comfort will improve and/or be controlled Outcome: Progressing   Problem: Safety: Goal: Ability to remain free from injury will improve Outcome: Progressing   Problem: Skin Integrity: Goal: Risk for impaired skin integrity will decrease Outcome: Progressing

## 2024-04-03 NOTE — TOC Initial Note (Addendum)
 Transition of Care [facility]) - Initial/Assessment Note    Patient Details  Name: Alexander Rich MRN: [id number] Date of Birth: October 08, 1925  Transition of [facility]) CM/SW Contact:    Toy LITTIE Agar, RN Phone Number:[phone]  04/03/2024, 2:47 PM  Clinical Narrative:                 Inpatient care manager following patient with SNF recommendation. CM at bedside to discuss disposition plan with patient. Patient states that he is from home where he has lived alone. Patient request that CM speak with his niece [name] to help with the the rehab process.   CM called niece Clara and explained consult and therapy recommendation for SNF. Niece is agreeable and gives CM permission to initiate bed search.    1600 FL2 completed and faxed out , PASRR [id number] , Insurance auth initiated [place] [id number]. Awaiting bed offers.    Expected Discharge Plan: Skilled Nursing Facility Barriers to Discharge: Continued Medical Work up   Patient Goals and CMS Choice Patient states their goals for this hospitalization and ongoing recovery are:: Wants to go to rehab CMS Medicare.gov Compare Post Acute Care list provided to:: Patient Choice offered to / list presented to : Patient (Niece per patient request [name]) [place] ownership interest in [facility].provided to:: Patient    Expected Discharge Plan and Services In-house Referral: NA Discharge Planning Services: CM Consult Post Acute Care Choice: Skilled Nursing Facility Living arrangements for the past 2 months: Single Family Home                 DME Arranged: N/A DME Agency: NA       HH Arranged: NA HH Agency: NA        Prior Living Arrangements/Services Living arrangements for the past 2 months: Single Family Home Lives with:: Self Patient language and need for interpreter reviewed:: Yes Do you feel safe going back to the place where you live?: No   Wants to build his strength up first  Need for Family  Participation in Patient Care: Yes (Comment) Care giver support system in place?: Yes (comment) Current home services:  (n/a) Criminal Activity/Legal Involvement Pertinent to Current Situation/Hospitalization: No - Comment as needed  Activities of Daily Living   ADL Screening (condition at time of admission) Independently performs ADLs?: Yes (appropriate for developmental age) Is the patient deaf or have difficulty hearing?: No Does the patient have difficulty seeing, even when wearing glasses/contacts?: No Does the patient have difficulty concentrating, remembering, or making decisions?: No  Permission Sought/Granted Permission sought to share information with : Case Manager, Family Supports Permission granted to share information with : Yes, Verbal Permission Granted  Share Information with NAME: [name]     Permission granted to share info w Relationship: niece  Permission granted to share info w Contact Information: [phone]  Emotional Assessment Appearance:: Appears stated age Attitude/Demeanor/Rapport: Lethargic Affect (typically observed): Quiet Orientation: : Oriented to Self, Oriented to Place, Oriented to  Time, Oriented to Situation Alcohol  / Substance Use: Not Applicable Psych Involvement: No (comment)  Admission diagnosis:  Rhabdomyolysis [M62.82] Weakness [R53.1] Fall, initial encounter [W19.XXXA] Non-traumatic rhabdomyolysis [M62.82] Patient Active Problem List   Diagnosis Date Noted   Rhabdomyolysis 03/30/2024   Macrocytosis 03/30/2024   Mild protein malnutrition 03/30/2024   Duodenal ulcer 03/30/2024   Cardiac arrhythmia 03/30/2024   Chronic idiopathic constipation 03/30/2024   Mixed hyperlipidemia 03/30/2024   Odynophagia 03/30/2024   Vitamin D deficiency 03/30/2024  Falls 03/30/2024   B12 deficiency 12/02/2022   Fall at home, initial encounter 11/03/2022   COVID-19 virus infection 11/03/2022   Dehydration 11/03/2022   Acute prerenal  azotemia 11/03/2022   Generalized weakness 11/02/2022   Sigmoid thickening 07/18/2022   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   Prostate enlargement 07/16/2022   Bilateral lower extremity edema 07/14/2022   History of duodenal ulcer 03/25/2022   Primary localized osteoarthritis of left hip 03/24/2022   Benign prostatic hyperplasia without urinary obstruction 03/22/2022   Sinus bradycardia 03/22/2022   Acute upper gastrointestinal bleeding 03/21/2022   Leukocytosis 03/21/2022   Macrocytic anemia 03/21/2022   Essential hypertension 03/21/2022   Hyperpigmented skin lesions 03/21/2022   Thrombocytopenia 03/21/2022   Pain of left hip joint 11/17/2021   Osteoarthritis of knee 02/17/2021   PCP:  Seabron Lenis, MD Pharmacy:   [facility] [phone] [address], [place] - [address] AT [facility] [address] [facility] [address] [place] [address] Phone: [phone] Fax: [phone]     Social Drivers of Health (SDOH) Social History: SDOH Screenings   Food Insecurity: No Food Insecurity (03/30/2024)  Housing: Low Risk  (03/30/2024)  Transportation Needs: No Transportation Needs (03/30/2024)  Utilities: Not At Risk (03/30/2024)  Depression (PHQ2-9): Low Risk  (01/05/2024)  Social Connections: Moderately Integrated (03/30/2024)  Tobacco Use: Low Risk  (03/30/2024)   SDOH Interventions:     Readmission Risk Interventions    11/04/2022    1:30 PM  Readmission Risk Prevention Plan  Transportation Screening Complete  PCP or Specialist Appt within 3-5 Days Complete  HRI or Home Care Consult Complete  Social Work Consult for Recovery Care Planning/Counseling Complete  Palliative Care Screening Not Applicable  Medication Review Oceanographer) Complete

## 2024-04-03 NOTE — Progress Notes (Signed)
 Initial Nutrition Assessment  INTERVENTION:   -Ensure Plus High Protein po BID, each supplement provides 350 kcal and 20 grams of protein.   -Multivitamin with minerals daily  -May need feeding or meal set up assistance  NUTRITION DIAGNOSIS:   Inadequate oral intake related to poor appetite as evidenced by per patient/family report.  GOAL:   Patient will meet greater than or equal to 90% of their needs  MONITOR:   PO intake, Supplement acceptance  REASON FOR ASSESSMENT:   Consult Assessment of nutrition requirement/status  ASSESSMENT:   89 year old male with a history of upper GI bleed, duodenal ulcer, hypertension, metastatic prostate cancer to bone, recurrent falls who presented with a fall from home,weakness.  Patient in room, poor historian as could not stay awake to answer questions. When asked why he isn't eating much, states I just want water and I just can't. Denies any issues with swallowing, chewing or taste. Pt with Ensure at bedside, pt not interested at this time. Lunch tray in room, do not feel patient can stay awake long enough to eat at this time. Will continue Ensure and add daily MVI given pt has likely not been eating well.  Per weight records, pt has lost 5 lbs since March 2025, insignificant for time frame).  Medications: Vitamin B-12, Miralax , Senokot  Labs reviewed.  NUTRITION - FOCUSED PHYSICAL EXAM:  Flowsheet Row Most Recent Value  Orbital Region Moderate depletion  Upper Arm Region Mild depletion  Thoracic and Lumbar Region Mild depletion  Buccal Region Moderate depletion  Temple Region Mild depletion  Clavicle Bone Region No depletion  Clavicle and Acromion Bone Region No depletion  Scapular Bone Region Unable to assess  Dorsal Hand No depletion  Patellar Region No depletion  Anterior Thigh Region No depletion  Posterior Calf Region No depletion  Edema (RD Assessment) Moderate  [BLE]  Hair Reviewed  Eyes Reviewed  [red  conjunctiva]  Mouth Reviewed  Skin Reviewed  [flaking skin]  Nails Reviewed    Diet Order:   Diet Order             Diet Heart Room service appropriate? Yes; Fluid consistency: Thin  Diet effective now                   EDUCATION NEEDS:   Not appropriate for education at this time  Skin:  Skin Assessment: Skin Integrity Issues: Skin Integrity Issues:: Other (Comment) Other: abrasions on arms, right wrist  Last BM:  11/29 -type 6  Height:   Ht Readings from Last 1 Encounters:  03/30/24 5' 7 (1.702 m)    Weight:   Wt Readings from Last 1 Encounters:  03/30/24 76.8 kg    BMI:  Body mass index is 26.52 kg/m.  Estimated Nutritional Needs:   Kcal:  1600-1800  Protein:  75-85g  Fluid:  1.6L/day   Morna Lee, MS, RD, LDN Inpatient Clinical Dietitian Contact via Secure chat

## 2024-04-04 ENCOUNTER — Observation Stay (HOSPITAL_COMMUNITY)

## 2024-04-04 DIAGNOSIS — M6282 Rhabdomyolysis: Secondary | ICD-10-CM | POA: Diagnosis not present

## 2024-04-04 DIAGNOSIS — R0602 Shortness of breath: Secondary | ICD-10-CM | POA: Diagnosis not present

## 2024-04-04 DIAGNOSIS — I517 Cardiomegaly: Secondary | ICD-10-CM | POA: Diagnosis not present

## 2024-04-04 DIAGNOSIS — R918 Other nonspecific abnormal finding of lung field: Secondary | ICD-10-CM | POA: Diagnosis not present

## 2024-04-04 DIAGNOSIS — R0989 Other specified symptoms and signs involving the circulatory and respiratory systems: Secondary | ICD-10-CM | POA: Diagnosis not present

## 2024-04-04 LAB — CBC
HCT: 31.7 % — ABNORMAL LOW (ref 39.0–52.0)
Hemoglobin: 10.1 g/dL — ABNORMAL LOW (ref 13.0–17.0)
MCH: 33.1 pg (ref 26.0–34.0)
MCHC: 31.9 g/dL (ref 30.0–36.0)
MCV: 103.9 fL — ABNORMAL HIGH (ref 80.0–100.0)
Platelets: 121 K/uL — ABNORMAL LOW (ref 150–400)
RBC: 3.05 MIL/uL — ABNORMAL LOW (ref 4.22–5.81)
RDW: 14.1 % (ref 11.5–15.5)
WBC: 5.7 K/uL (ref 4.0–10.5)
nRBC: 0 % (ref 0.0–0.2)

## 2024-04-04 MED ORDER — FUROSEMIDE 10 MG/ML IJ SOLN
40.0000 mg | Freq: Once | INTRAMUSCULAR | Status: AC
  Administered 2024-04-04: 40 mg via INTRAVENOUS
  Filled 2024-04-04: qty 4

## 2024-04-04 MED ORDER — IPRATROPIUM-ALBUTEROL 0.5-2.5 (3) MG/3ML IN SOLN
3.0000 mL | Freq: Four times a day (QID) | RESPIRATORY_TRACT | Status: DC
  Administered 2024-04-04: 3 mL via RESPIRATORY_TRACT
  Filled 2024-04-04: qty 3

## 2024-04-04 MED ORDER — GUAIFENESIN ER 600 MG PO TB12
600.0000 mg | ORAL_TABLET | Freq: Two times a day (BID) | ORAL | Status: DC
  Administered 2024-04-04 – 2024-04-06 (×5): 600 mg via ORAL
  Filled 2024-04-04 (×5): qty 1

## 2024-04-04 MED ORDER — IPRATROPIUM-ALBUTEROL 0.5-2.5 (3) MG/3ML IN SOLN
3.0000 mL | Freq: Three times a day (TID) | RESPIRATORY_TRACT | Status: DC
  Administered 2024-04-04 – 2024-04-05 (×2): 3 mL via RESPIRATORY_TRACT
  Filled 2024-04-04 (×2): qty 3

## 2024-04-04 MED ORDER — CHLORHEXIDINE GLUCONATE CLOTH 2 % EX PADS
6.0000 | MEDICATED_PAD | Freq: Every day | CUTANEOUS | Status: DC
  Administered 2024-04-04 – 2024-04-13 (×9): 6 via TOPICAL

## 2024-04-04 MED ORDER — IPRATROPIUM-ALBUTEROL 0.5-2.5 (3) MG/3ML IN SOLN: 3.0000 mL | Freq: Three times a day (TID) | RESPIRATORY_TRACT | Status: DC

## 2024-04-04 MED ORDER — BISACODYL 10 MG RE SUPP
10.0000 mg | Freq: Once | RECTAL | Status: AC
  Administered 2024-04-04: 10 mg via RECTAL
  Filled 2024-04-04: qty 1

## 2024-04-04 NOTE — TOC Progression Note (Addendum)
 Transition of Care [facility]) - Progression Note    Patient Details  Name: Alexander [name] MRN: [id number] Date of Birth: [birth year]  Transition of Care [facility]) CM/SW Contact  Toy LITTIE Agar, RN Phone Number:[phone]  04/04/2024, 10:41 AM  Clinical Narrative:    CM received message from [facility] stating that medical director is requesting peer to peer with deadline being 12/2 at 3pm. [phone]  option 5. Message has been sent and acknowledged by MD.   1058 Per MD insurance has denied stating that patient is not medically ready. Patient is currently receiving lasix  and not medically ready. Md states that he will update CM when patient is medically ready and we can initiate new auth.    Expected Discharge Plan: Skilled Nursing Facility Barriers to Discharge: Continued Medical Work up               Expected Discharge Plan and Services In-house Referral: NA Discharge Planning Services: CM Consult Post Acute Care Choice: Skilled Nursing Facility Living arrangements for the past 2 months: Single Family Home                 DME Arranged: N/A DME Agency: NA       HH Arranged: NA HH Agency: NA         Social Drivers of Health (SDOH) Interventions SDOH Screenings   Food Insecurity: No Food Insecurity (03/30/2024)  Housing: Low Risk  (03/30/2024)  Transportation Needs: No Transportation Needs (03/30/2024)  Utilities: Not At Risk (03/30/2024)  Depression (PHQ2-9): Low Risk  (01/05/2024)  Social Connections: Moderately Integrated (03/30/2024)  Tobacco Use: Low Risk  (03/30/2024)    Readmission Risk Interventions    11/04/2022    1:30 PM  Readmission Risk Prevention Plan  Transportation Screening Complete  PCP or Specialist Appt within 3-5 Days Complete  HRI or Home Care Consult Complete  Social Work Consult for Recovery Care Planning/Counseling Complete  Palliative Care Screening Not Applicable  Medication Review Oceanographer) Complete

## 2024-04-04 NOTE — Plan of Care (Signed)
  Problem: Clinical Measurements: Goal: Cardiovascular complication will be avoided Outcome: Progressing   Problem: Nutrition: Goal: Adequate nutrition will be maintained Outcome: Progressing   Problem: Coping: Goal: Level of anxiety will decrease Outcome: Progressing   Problem: Safety: Goal: Ability to remain free from injury will improve Outcome: Progressing   Problem: Skin Integrity: Goal: Risk for impaired skin integrity will decrease Outcome: Progressing   

## 2024-04-04 NOTE — Plan of Care (Signed)
  Problem: Activity: Goal: Risk for activity intolerance will decrease Outcome: Progressing   Problem: Elimination: Goal: Will not experience complications related to urinary retention Outcome: Progressing   Problem: Safety: Goal: Ability to remain free from injury will improve Outcome: Progressing   

## 2024-04-04 NOTE — Progress Notes (Addendum)
 PROGRESS NOTE  Alexander [name]  FMW:[id number] DOB: [birth year] DOA: 03/30/2024 PCP: Seabron Lenis, MD   Brief Narrative: Patient is a 89 year old male with a history of upper GI bleed, duodenal ulcer, hypertension, metastatic prostate cancer to bone, recurrent falls who presented with a fall from home,weakness.  She lives alone.  Ambulates with the help of walker.  He felt weak and fell on the floor of the bathtub and could not get up for around 16 hours .on presentation he was hypertensive.  CK level was in the range of 800s.  Right shoulder x-ray did not show any fracture or dislocation.  CT head did not show any acute findings as well as CT cervical spine.  Showed multilevel cervical degenerative changes, stable sclerotic metastasis at the T1 vertebral body.  CT chest abdomen/pelvis did not show any acute findings.  Patient admitted for the management of generalized weakness, rhabdomyolysis.  Started on IV fluid.  PT consulted.  Recommended home health.  Patient states he does not want to live alone and wants to go to SNF for long-term .he lives alone.  TOC consulted and following  Assessment & Plan:  Principal Problem:   Rhabdomyolysis Active Problems:   Prostate cancer metastatic to bone [facility])   Essential hypertension   Thrombocytopenia   Sinus bradycardia   Generalized weakness   B12 deficiency   Macrocytosis   Mild protein malnutrition   Falls   Generalized weakness/recurrent falls/rhabdomyolysis: Given iv fluid.  Lives alone.  Ambulates with the help of walker.  He has 2 nieces.  He wants to go to SNF for long-term.  TOC consulted. PT recommended home health.  PT consulted.  I think he has high risk for fall at home and may be unsafe for him to be at home alone. I had a long discussion with his niece's  husband on 11/29.  He said himself and his niece are in the 52s and they are unable to take care of him anymore.  They are requesting if he can be placed in a facility for long-term  care.  PT recommended SNF.  Dyspnea: Appears slightly short of breath today but saturating fine on room air.  Chest x-ray showed mild features of pulmonary edema.  Has fine crackles in bilateral bases.  Giving him a dose of Lasix  IV once 40 mg.  Will assess volume status tomorrow.  IV fluids have already been discontinued.  Hypertension:   Takes losartan  at home.  Restarted  Metastatic prostate cancer to bone/urinary transition: Continue Flomax .  Patient intermittently retaining urine.  Will put a Foley catheter.  Will give voiding trial in next 24 to 48 hours  Sinus bradycardia: Stable.  Avoid AV nodal blocking agents  Thrombocytopenia: Stable.  Continue to monitor  History of vitamin B12 deficiency: Continue supplementation  Constipation: Continue bowel regimen  Chronic back pain: On oxycodone  at home  Goals of care: Very elderly patient with metastatic prostate cancer.  Extremely deconditioned.  Still remains full code.  Lives alone.  Will consult palliative care for goals of care.     Nutrition Problem: Inadequate oral intake Etiology: poor appetite    DVT prophylaxis:enoxaparin  (LOVENOX ) injection 40 mg Start: 03/30/24 2200     Code Status: Full Code  Family Communication: Called and discussed with his niece's husband on phone on 11/29  Patient status: Observation  Patient is from : Home  Anticipated discharge to: SNF   Estimated DC date: 1-2 days   Consultants: None  Procedures: None  Antimicrobials:  Anti-infectives (From admission, onward)    None       Subjective: Patient seen and examined at bedside today.  Overall comfortable.  Lying in bed.  On room air.  Attempting to eat his breakfast.  He is having intermittent retention of urine.  Does not complain of any abdominal pain.  Denies any shortness of breath.  Found to have bilateral basal crackles.  Objective: Vitals:   04/03/24 1256 04/03/24 1721 04/03/24 2025 04/04/24 0504  BP: (!) 113/55  (!)  152/65 138/69  Pulse: 70  68 85  Resp: 20 (!) 22 18 18   Temp: 99.7 F (37.6 C)  98.6 F (37 C) 98.1 F (36.7 C)  TempSrc: Oral  Oral Oral  SpO2: 98%  94% 96%  Weight:      Height:        Intake/Output Summary (Last 24 hours) at 04/04/2024 1049 Last data filed at 04/04/2024 1044 Gross per 24 hour  Intake 1127.06 ml  Output 660 ml  Net 467.06 ml   Filed Weights   03/30/24 1823  Weight: 76.8 kg    Examination:     General exam: Overall comfortable, not in distress,pleasant elderly gentleman, chronically deconditioned and weak HEENT: PERRL Respiratory system: Mild bilateral basal crackles Cardiovascular system: S1 & S2 heard, RRR.  Gastrointestinal system: Abdomen is nondistended, soft and nontender. Central nervous system: Alert and oriented Extremities: No edema, no clubbing ,no cyanosis Skin: No rashes, no ulcers,no icterus      Data Reviewed: I have personally reviewed following labs and imaging studies  CBC: Recent Labs  Lab 03/30/24 1215 03/31/24 0511 04/03/24 0444 04/04/24 0557  WBC 10.0 6.5 11.3* 5.7  NEUTROABS 6.6  --   --   --   HGB 13.0 11.7* 9.8* 10.1*  HCT 40.1 36.8* 31.4* 31.7*  MCV 101.8* 103.4* 106.4* 103.9*  PLT 147* 120* 114* 121*   Basic Metabolic Panel: Recent Labs  Lab 03/30/24 1215 03/31/24 0511 04/03/24 0444  NA 139 136 137  K 3.6 3.6 3.5  CL 108 107 109  CO2 22 20* 20*  GLUCOSE 95 78 109*  BUN 16 14 11   CREATININE 0.99 0.91 1.04  CALCIUM 8.3* 7.8* 8.0*     Recent Results (from the past 240 hours)  Resp panel by RT-PCR (RSV, Flu A&B, Covid) Anterior Nasal Swab     Status: None   Collection Time: 03/30/24  1:23 PM   Specimen: Anterior Nasal Swab  Result Value Ref Range Status   SARS Coronavirus 2 by RT PCR NEGATIVE NEGATIVE Final    Comment: (NOTE) SARS-CoV-2 target nucleic acids are NOT DETECTED.  The SARS-CoV-2 RNA is generally detectable in upper respiratory specimens during the acute phase of infection. The  lowest concentration of SARS-CoV-2 viral copies this assay can detect is 138 copies/mL. A negative result does not preclude SARS-Cov-2 infection and should not be used as the sole basis for treatment or other patient management decisions. A negative result may occur with  improper specimen collection/handling, submission of specimen other than nasopharyngeal swab, presence of viral mutation(s) within the areas targeted by this assay, and inadequate number of viral copies(<138 copies/mL). A negative result must be combined with clinical observations, patient history, and epidemiological information. The expected result is Negative.  Fact Sheet for Patients:  bloggercourse.com  Fact Sheet for Healthcare Providers:  seriousbroker.it  This test is no t yet approved or cleared by the [place]  FDA and  has been authorized for detection and/or  diagnosis of SARS-CoV-2 by FDA under an Emergency Use Authorization (EUA). This EUA will remain  in effect (meaning this test can be used) for the duration of the COVID-19 declaration under Section 564(b)(1) of the Act, 21 U.S.C.section 360bbb-3(b)(1), unless the authorization is terminated  or revoked sooner.       Influenza A by PCR NEGATIVE NEGATIVE Final   Influenza B by PCR NEGATIVE NEGATIVE Final    Comment: (NOTE) The Xpert Xpress SARS-CoV-2/FLU/RSV plus assay is intended as an aid in the diagnosis of influenza from Nasopharyngeal swab specimens and should not be used as a sole basis for treatment. Nasal washings and aspirates are unacceptable for Xpert Xpress SARS-CoV-2/FLU/RSV testing.  Fact Sheet for Patients: bloggercourse.com  Fact Sheet for Healthcare Providers: seriousbroker.it  This test is not yet approved or cleared by the [place]  FDA and has been authorized for detection and/or diagnosis of SARS-CoV-2 by FDA under  an Emergency Use Authorization (EUA). This EUA will remain in effect (meaning this test can be used) for the duration of the COVID-19 declaration under Section 564(b)(1) of the Act, 21 U.S.C. section 360bbb-3(b)(1), unless the authorization is terminated or revoked.     Resp Syncytial Virus by PCR NEGATIVE NEGATIVE Final    Comment: (NOTE) Fact Sheet for Patients: bloggercourse.com  Fact Sheet for Healthcare Providers: seriousbroker.it  This test is not yet approved or cleared by the [place]  FDA and has been authorized for detection and/or diagnosis of SARS-CoV-2 by FDA under an Emergency Use Authorization (EUA). This EUA will remain in effect (meaning this test can be used) for the duration of the COVID-19 declaration under Section 564(b)(1) of the Act, 21 U.S.C. section 360bbb-3(b)(1), unless the authorization is terminated or revoked.  Performed at [facility], [address] [place], [place] [address]      Radiology Studies: DG CHEST PORT 1 VIEW Result Date: 04/04/2024 CLINICAL DATA:  Short of breath EXAM: PORTABLE CHEST 1 VIEW COMPARISON:  Prior chest x-ray 11/02/2022 FINDINGS: Increased pulmonary vascular congestion with interstitial prominence and bibasilar atelectasis. Findings suggest mild interstitial edema. Stable mild cardiomegaly. Atherosclerotic calcifications are present in the transverse aorta. Probable trace bilateral pleural effusions. No pneumothorax. No acute osseous abnormality. IMPRESSION: 1. Findings are most consistent with mild CHF. Stable cardiomegaly with increased pulmonary vascular congestion, mild interstitial edema and likely small bilateral pleural effusions. Electronically Signed   By: Wilkie Lent M.D.   On: 04/04/2024 09:59     Scheduled Meds:  abiraterone  acetate  1,000 mg Oral Daily   bisacodyl  10 mg Rectal Once   Chlorhexidine  Gluconate Cloth  6 each Topical  Daily   cyanocobalamin   1,000 mcg Oral Daily   enoxaparin  (LOVENOX ) injection  40 mg Subcutaneous Q24H   feeding supplement  237 mL Oral BID BM   furosemide   40 mg Intravenous Once   guaiFENesin  600 mg Oral BID   ipratropium-albuterol   3 mL Nebulization Q6H   losartan   100 mg Oral q AM   multivitamin with minerals  1 tablet Oral Daily   polyethylene glycol  17 g Oral Daily   senna-docusate  1 tablet Oral BID   tamsulosin   0.4 mg Oral q AM   Continuous Infusions:     LOS: 0 days   Ivonne Mustache, MD [facility] P12/06/2023, 10:49 AM

## 2024-04-04 NOTE — Progress Notes (Signed)
 Physical Therapy Treatment Patient Details Name: Alexander [name] MRN: [id number] DOB: 09/21/1925 Today's Date: 04/04/2024   History of Present Illness 89 yo male admitted to hospital on 03/30/2024 due to 2 falls secondary to progressive weakness, pt found to have rhabdomyolysis. 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    PT Comments  Pt requiring slightly more assist this session and with decr activity tolerance. Pt agreeable to PT however fatigued quickly during session.  Patient will benefit from continued inpatient follow up therapy, <3 hours/day Will continue to follow in acute setting.    If plan is discharge home, recommend the following: A little help with walking and/or transfers;A little help with bathing/dressing/bathroom;Assistance with cooking/housework;Assist for transportation;Help with stairs or ramp for entrance   Can travel by private vehicle     Yes  Equipment Recommendations  None recommended by PT    Recommendations for Other Services       Precautions / Restrictions Precautions Precautions: Fall Recall of Precautions/Restrictions: Intact Restrictions Weight Bearing Restrictions Per Provider Order: No     Mobility  Bed Mobility Overal bed mobility: Needs Assistance Bed Mobility: Supine to Sit     Supine to sit: Min assist, HOB elevated     General bed mobility comments: Assist for trunk to upright provided at pt's request after having difficulty getting to EOB. HOB elevated ~30 degrees.    Transfers   Equipment used: Rolling walker (2 wheels) Transfers: Sit to/from Stand Sit to Stand: Min assist, From elevated surface           General transfer comment: Cues for safety, hand placement. assist to wt shift to stand    Ambulation/Gait Ambulation/Gait assistance: Min assist Gait Distance (Feet): 5 Feet Assistive device: Rolling  walker (2 wheels) Gait Pattern/deviations: Decreased step length - right, Decreased step length - left Gait velocity: decreased     General Gait Details: slight trunk flexion. cues for safety, posture and RW position. pt reports fatigue after amb a few steps, pulled chair to pt. assist needed to steady and manage RW   Stairs             Wheelchair Mobility     Tilt Bed    Modified Rankin (Stroke Patients Only)       Balance   Sitting-balance support: Feet supported, Single extremity supported, No upper extremity supported Sitting balance-Leahy Scale: Fair (initial posterior lean, progressed to fair/static balance)     Standing balance support: Bilateral upper extremity supported, During functional activity, Reliant on assistive device for balance Standing balance-Leahy Scale: Poor                              Communication Communication Communication: No apparent difficulties  Cognition Arousal: Alert Behavior During Therapy: WFL for tasks assessed/performed   PT - Cognitive impairments: No apparent impairments                       PT - Cognition Comments: slow to porcess but was sleepign on arrival Following commands: Intact      Cueing Cueing Techniques: Verbal cues  Exercises      General Comments        Pertinent Vitals/Pain Pain Assessment Pain Assessment: No/denies pain    Home Living  Prior Function            PT Goals (current goals can now be found in the care plan section) Acute Rehab PT Goals Patient Stated Goal: to go to a home and get some help PT Goal Formulation: With patient Time For Goal Achievement: 04/14/24 Potential to Achieve Goals: Good Progress towards PT goals: Progressing toward goals    Frequency    Min 2X/week      PT Plan      Co-evaluation              AM-PAC PT 6 Clicks Mobility   Outcome Measure  Help needed turning from your back to  your side while in a flat bed without using bedrails?: A Little Help needed moving from lying on your back to sitting on the side of a flat bed without using bedrails?: A Little Help needed moving to and from a bed to a chair (including a wheelchair)?: A Little Help needed standing up from a chair using your arms (e.g., wheelchair or bedside chair)?: A Little Help needed to walk in hospital room?: A Lot Help needed climbing 3-5 steps with a railing? : A Lot 6 Click Score: 16    End of Session Equipment Utilized During Treatment: Gait belt Activity Tolerance: Patient tolerated treatment well;Patient limited by fatigue Patient left: in chair;with call bell/phone within reach;with chair alarm set Nurse Communication: Mobility status PT Visit Diagnosis: Unsteadiness on feet (R26.81);Other abnormalities of gait and mobility (R26.89);Repeated falls (R29.6);Muscle weakness (generalized) (M62.81);Difficulty in walking, not elsewhere classified (R26.2)     Time: 8398-8384 PT Time Calculation (min) (ACUTE ONLY): 14 min  Charges:    $Therapeutic Activity: 8-22 mins PT General Charges $$ ACUTE PT VISIT: 1 Visit                     Liliya Fullenwider, PT  Acute Rehab Dept [facility]) [phone]  04/04/2024    [facility] 04/04/2024, 4:27 PM

## 2024-04-05 ENCOUNTER — Observation Stay (HOSPITAL_COMMUNITY)

## 2024-04-05 DIAGNOSIS — Z515 Encounter for palliative care: Secondary | ICD-10-CM

## 2024-04-05 DIAGNOSIS — Z7189 Other specified counseling: Secondary | ICD-10-CM

## 2024-04-05 DIAGNOSIS — I10 Essential (primary) hypertension: Secondary | ICD-10-CM | POA: Diagnosis not present

## 2024-04-05 DIAGNOSIS — R531 Weakness: Secondary | ICD-10-CM | POA: Diagnosis not present

## 2024-04-05 DIAGNOSIS — Z66 Do not resuscitate: Secondary | ICD-10-CM

## 2024-04-05 DIAGNOSIS — M6282 Rhabdomyolysis: Secondary | ICD-10-CM | POA: Diagnosis not present

## 2024-04-05 LAB — PRO BRAIN NATRIURETIC PEPTIDE: Pro Brain Natriuretic Peptide: 1195 pg/mL — ABNORMAL HIGH (ref <300.0)

## 2024-04-05 LAB — ECHOCARDIOGRAM COMPLETE
AR max vel: 1.91 cm2
AV Area VTI: 2.09 cm2
AV Area mean vel: 2.21 cm2
AV Mean grad: 10 mmHg
AV Peak grad: 22.5 mmHg
Ao pk vel: 2.37 m/s
Area-P 1/2: 3.1 cm2
Calc EF: 62.3 %
Height: 67 in
MV VTI: 2.88 cm2
P 1/2 time: 500 ms
S' Lateral: 2.4 cm
Single Plane A2C EF: 55.1 %
Single Plane A4C EF: 68.9 %
Weight: 2709.01 [oz_av]

## 2024-04-05 LAB — BASIC METABOLIC PANEL WITH GFR
Anion gap: 10 (ref 5–15)
BUN: 17 mg/dL (ref 8–23)
CO2: 22 mmol/L (ref 22–32)
Calcium: 8.3 mg/dL — ABNORMAL LOW (ref 8.9–10.3)
Chloride: 106 mmol/L (ref 98–111)
Creatinine, Ser: 1.12 mg/dL (ref 0.61–1.24)
GFR, Estimated: >60 mL/min (ref >60)
Glucose, Bld: 95 mg/dL (ref 70–99)
Potassium: 3.1 mmol/L — ABNORMAL LOW (ref 3.5–5.1)
Sodium: 138 mmol/L (ref 135–145)

## 2024-04-05 LAB — MAGNESIUM: Magnesium: 2.3 mg/dL (ref 1.7–2.4)

## 2024-04-05 MED ORDER — IPRATROPIUM-ALBUTEROL 0.5-2.5 (3) MG/3ML IN SOLN: 3.0000 mL | Freq: Four times a day (QID) | RESPIRATORY_TRACT | Status: DC | PRN

## 2024-04-05 MED ORDER — POTASSIUM CHLORIDE CRYS ER 20 MEQ PO TBCR
40.0000 meq | EXTENDED_RELEASE_TABLET | ORAL | Status: AC
  Administered 2024-04-05 (×2): 40 meq via ORAL
  Filled 2024-04-05 (×2): qty 2

## 2024-04-05 MED ORDER — FUROSEMIDE 10 MG/ML IJ SOLN
40.0000 mg | Freq: Once | INTRAMUSCULAR | Status: AC
  Administered 2024-04-05: 40 mg via INTRAVENOUS
  Filled 2024-04-05: qty 4

## 2024-04-05 NOTE — Plan of Care (Signed)
   Problem: Health Behavior/Discharge Planning: Goal: Ability to manage health-related needs will improve Outcome: Progressing

## 2024-04-05 NOTE — Consult Note (Signed)
 Consultation Note Date: 04/05/2024   Patient Name: Alexander Rich  DOB: 1926/03/01  MRN: [id number]  Age / Sex: 89 y.o., male   PCP: Seabron Lenis, MD Referring Physician: Jillian Buttery, MD  Reason for Consultation: Establishing goals of care     Chief Complaint/History of Present Illness:   Patient is a 89 year old male with a past medical history of upper GI bleed, duodenal ulcer, hypertension, metastatic prostate cancer to bone, chronic back pain, and recurrent falls who was admitted on 03/30/2024 after a fall at home.  Patient lived alone and normally ambulated with help of a walker.  Patient noted that he had a fall at home on the floor of the bathtub and could not get up for around 16 hours.  Since being hospitalized, patient has received management for rhabdomyolysis, electrolyte abnormalities, and elevated BNP.  Palliative medicine team consulted to assist with complex medical decision making.  Extensive review of EMR including recent documentation from hospitalist, physical therapist, and TOC.  TOC assisting with placement as patient has stated he is not safe to return home on his own.  There are no family members who can assist in his care.  More recently patient has developed crackles at lung bases bilaterally and hospitalist performing workup for possible CHF.  Reviewed recent lab work noting Pro BNP elevated to 1195.  Patient also had to have Foley placed for urinary retention.  Personally reviewed recent chest x-ray noting pulmonary congestion which could be a sign of CHF.  Awaiting echo for workup.  Presented to bedside to meet with patient.  Patient laying comfortably in bed.  Introduced myself as a member of the palliative medicine team and my role in patient's medical journey.  Patient noted that he was currently having issues with his chronic back pain which nursing was going to bring in pain medications to assist with. Spent time learning about patient's medical journey to  this point.  With discussion, patient noted that he does not feel safe to return home on his own and wants to seek long-term care placement.  He appreciates TOC's assistance with coordinating this. With permission, inquired about ACP documentation.  Patient noted that if he was unable to make medical decisions for himself, he would want his niece, Alexander Rich, to make medical decisions on his behalf.  Patient was married for over 50 years though he and his wife who is now deceased did not have any children.  Inquired if patient had documentation stating wishes about Alexander being his HCPOA and he believes that she may have copies.  He asked me to call her and make sure.  Acknowledged would do this. Also discussed patient's CODE STATUS and inquired if patient had prior paperwork about this.  Spent time explaining full code versus DNR/DNI.  Expressed concern that should patient be sick and if his heart were to stop or he would stop breathing, chest compression and intubation with mechanical ventilation would not lead to what he sees as good quality of life outcomes.  Patient acknowledged this and noted he would not want those types of interventions.  Patient agreeing with change of CODE STATUS to DNR/DNI.  Noted would update Alexander regarding this decision as well.  Patient voiced appreciation for this.  Spent time providing emotional support via active listening.  Noted plan to go call Alexander and provide updates.  Patient asked to be informed if we were able to speak so noted would return.  After visit with patient able to call  patient's niece, Alexander Rich.  Introduced myself as a member of the palliative medicine team and my role in patient's medical journey.  Spent time providing medical updates regarding patient's situation.  Discussed continuing management and reversing acute illnesses as able.  Discussed planning moving forward.  Alexander noted that she is in her 70s and cannot assist in patient's care herself.   She noted there is no other family who can really help care for the patient.  She agrees that patient needs long-term care placement to be safe.  Noted TOC would be assisting with that. Did inquire about ACP documentation. Alexander states she has multiple forms from the patient though she is unsure of the specifics.  Her son is assisting with coordinating care for the patient as well so she will ask him if that documentation is in the patient's paperwork.  Discussed would need to be placed on patient's EMR if available.  Also noted that if it is not available, can assist with completion here in the hospital. Alexander noted she would follow-up on this.  Also discussed patient's CODE STATUS.  Explained full code versus DNR and discussion had with patient.  Alexander very much supports patient being DNR/DNI.  Answered all questions as able at that time.  Noted palliative medicine team will continue to follow with patient's medical journey.  Went back to patient and updated him regarding updates to Alexander.  Patient voiced appreciation for the conversation.  Noted palliative medicine team will continue to follow along with patient's medical journey.  Updated care team including hospitalist, TOC, and bedside RN after visit to coordinate care moving forward.  Primary Diagnoses  Present on Admission:  Rhabdomyolysis  Thrombocytopenia  Sinus bradycardia  Prostate cancer metastatic to bone [facility])  Essential hypertension  Macrocytosis  Mild protein malnutrition  B12 deficiency   Past Medical History:  Diagnosis Date   B12 deficiency 12/02/2022   Constipation    Hip pain    Hyperpigmented skin lesions 03/21/2022   Hypertension    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   Vitamin D deficiency 03/30/2024   Social History   Socioeconomic History   Marital status: Widowed    Spouse name: Not on file   Number of children: 0   Years of education: Not on file   Highest education level: Not on file   Occupational History   Not on file  Tobacco Use   Smoking status: Never   Smokeless tobacco: Never  Vaping Use   Vaping status: Never Used  Substance and Sexual Activity   Alcohol  use: Yes   Drug use: Never    Comment: occasional   Sexual activity: Not Currently  Other Topics Concern   Not on file  Social History Narrative   Not on file   Social Drivers of Health   Financial Resource Strain: Not on file  Food Insecurity: No Food Insecurity (03/30/2024)   Hunger Vital Sign    Worried About Running Out of Food in the Last Year: Never true    Ran Out of Food in the Last Year: Never true  Transportation Needs: No Transportation Needs (03/30/2024)   PRAPARE - Administrator, Civil Service (Medical): No    Lack of Transportation (Non-Medical): No  Physical Activity: Not on file  Stress: Not on file  Social Connections: Moderately Integrated (03/30/2024)   Social Connection and Isolation Panel    Frequency of Communication with Friends and Family: More than three times a week  Frequency of Social Gatherings with Friends and Family: Never    Attends Religious Services: 1 to 4 times per year    Active Member of Clubs or Organizations: No    Attends Banker Meetings: 1 to 4 times per year    Marital Status: Widowed   Family History  Problem Relation Age of Onset   Kidney disease Mother    CVA Father    Scheduled Meds:  abiraterone  acetate  1,000 mg Oral Daily   Chlorhexidine  Gluconate Cloth  6 each Topical Daily   cyanocobalamin   1,000 mcg Oral Daily   enoxaparin  (LOVENOX ) injection  40 mg Subcutaneous Q24H   feeding supplement  237 mL Oral BID BM   guaiFENesin  600 mg Oral BID   ipratropium-albuterol   3 mL Nebulization TID   losartan   100 mg Oral q AM   multivitamin with minerals  1 tablet Oral Daily   polyethylene glycol  17 g Oral Daily   senna-docusate  1 tablet Oral BID   tamsulosin   0.4 mg Oral q AM   Continuous Infusions: PRN  Meds:.acetaminophen  **OR** acetaminophen , artificial tears, ondansetron  **OR** ondansetron  (ZOFRAN ) IV, oxyCODONE  Allergies  Allergen Reactions   Ibuprofen Other (See Comments)    GI bleed   Nebivolol Hcl Other (See Comments)    Felt blah; low energy   Pravastatin Other (See Comments)    Lethargy   Sertraline Hcl Other (See Comments)    Terrible feeling   Simvastatin Other (See Comments)    Lethargy   Trazodone Other (See Comments)    Foggy-headed   CBC:    Component Value Date/Time   WBC 5.7 04/04/2024 0557   HGB 10.1 (L) 04/04/2024 0557   HGB 12.4 (L) 03/08/2024 0932   HCT 31.7 (L) 04/04/2024 0557   PLT 121 (L) 04/04/2024 0557   PLT 149 (L) 03/08/2024 0932   MCV 103.9 (H) 04/04/2024 0557   NEUTROABS 6.6 03/30/2024 1215   LYMPHSABS 2.0 03/30/2024 1215   MONOABS 1.2 (H) 03/30/2024 1215   EOSABS 0.1 03/30/2024 1215   BASOSABS 0.0 03/30/2024 1215   Comprehensive Metabolic Panel:    Component Value Date/Time   NA 138 04/05/2024 0515   K 3.1 (L) 04/05/2024 0515   CL 106 04/05/2024 0515   CO2 22 04/05/2024 0515   BUN 17 04/05/2024 0515   CREATININE 1.12 04/05/2024 0515   CREATININE 1.11 03/08/2024 0932   GLUCOSE 95 04/05/2024 0515   CALCIUM 8.3 (L) 04/05/2024 0515   AST 55 (H) 03/31/2024 0511   AST 21 03/08/2024 0932   ALT 19 03/31/2024 0511   ALT 9 03/08/2024 0932   ALKPHOS 38 03/31/2024 0511   BILITOT 1.3 (H) 03/31/2024 0511   BILITOT 0.9 03/08/2024 0932   PROT 5.2 (L) 03/31/2024 0511   ALBUMIN 2.6 (L) 03/31/2024 0511    Physical Exam: Vital Signs: BP 129/70 (BP Location: Left Arm)   Pulse 78   Temp 98.4 F (36.9 C) (Oral)   Resp 18   Ht 5' 7 (1.702 m)   Wt 76.8 kg   SpO2 95%   BMI 26.52 kg/m  SpO2: SpO2: 95 % O2 Device: O2 Device: Room Air O2 Flow Rate:   Intake/output summary:  Intake/Output Summary (Last 24 hours) at 04/05/2024 0655 Last data filed at 04/05/2024 0500 Gross per 24 hour  Intake 480 ml  Output 1400 ml  Net -920 ml   LBM:  Last BM Date : 04/04/24 Baseline Weight: Weight: 76.8 kg Most recent weight:  Weight: 76.8 kg  General: NAD, alert, chronically ill-appearing, elderly Cardiovascular: RRR Respiratory: not in respiratory distress Abdomen: not distended Neuro: A&Ox4, following commands easily Psych: appropriately answers all questions    Palliative Performance Scale: 40%              Additional Data Reviewed: Recent Labs    04/03/24 0444 04/04/24 0557 04/05/24 0515  WBC 11.3* 5.7  --   HGB 9.8* 10.1*  --   PLT 114* 121*  --   NA 137  --  138  BUN 11  --  17  CREATININE 1.04  --  1.12    Imaging: DG CHEST PORT 1 VIEW CLINICAL DATA:  Short of breath  EXAM: PORTABLE CHEST 1 VIEW  COMPARISON:  Prior chest x-ray 11/02/2022  FINDINGS: Increased pulmonary vascular congestion with interstitial prominence and bibasilar atelectasis. Findings suggest mild interstitial edema. Stable mild cardiomegaly. Atherosclerotic calcifications are present in the transverse aorta. Probable trace bilateral pleural effusions. No pneumothorax. No acute osseous abnormality.  IMPRESSION: 1. Findings are most consistent with mild CHF. Stable cardiomegaly with increased pulmonary vascular congestion, mild interstitial edema and likely small bilateral pleural effusions.  Electronically Signed   By: Wilkie Lent M.D.   On: 04/04/2024 09:59    I personally reviewed recent imaging.   Palliative Care Assessment and Plan Summary of Established Goals of Care and Medical Treatment Preferences   Patient is a 89 year old male with a past medical history of upper GI bleed, duodenal ulcer, hypertension, metastatic prostate cancer to bone, chronic back pain, and recurrent falls who was admitted on 03/30/2024 after a fall at home.  Patient lived alone and normally ambulated with help of a walker.  Patient noted that he had a fall at home on the floor of the bathtub and could not get up for around 16 hours.  Since being  hospitalized, patient has received management for rhabdomyolysis, electrolyte abnormalities, and elevated BNP.  Palliative medicine team consulted to assist with complex medical decision making.  # Complex medical decision making/goals of care  - Discussed care with patient at bedside as detailed above in HPI.  Also discussed care with patient's niece/reported HCPOA, Alexander Rich, as detailed above in HPI.  At this time continuing appropriate medical interventions.  Patient seeking long-term care placement as unsafe to return home on his own.  Patient has stated that if he was unable to make medical decisions for himself, he would want his niece, Alexander Rich, to make medical decisions on his behalf.  Alexander working to determine if patient has ACP documentation naming her HCPOA.  Noted hospital can assist with this paperwork if needed.  Palliative medicine team continuing to follow along with patient's medical journey.  -  Code Status: Limited: Do not attempt resuscitation (DNR) -DNR-LIMITED -Do Not Intubate/DNI     - Discussed CODE STATUS with patient explaining full code versus DNR/DNI.  Patient notes that he would not want cardiac resuscitation or intubation with mechanical ventilation.  CODE STATUS appropriately changed to DNR/DNI.  Alexander supporting of patient's decision regarding CODE STATUS change.  # Psycho-social/Spiritual Support:  - Support System: Psychologist, Sport [employer] Exercise  # Discharge Planning:  To Be Determined  Thank you for allowing the palliative care team to participate in the care Ellaree JINNY Saran.  Tinnie Radar, DO Palliative Care Provider PMT # [phone]  If patient remains symptomatic despite maximum doses, please call PMT at [phone] between 0700 and 1900. Outside of these hours, please call attending, as PMT does  not have night coverage.  Billing based on MDM: High  Problems Addressed: One or more chronic illnesses with severe exacerbation, progression, or side effects of  treatment.  Risks: Decision not to resuscitate or to de-escalate care because of poor prognosis

## 2024-04-05 NOTE — Progress Notes (Addendum)
 PROGRESS NOTE  Alexander Rich  FMW:[id number] DOB: 09/21/1925 DOA: 03/30/2024 PCP: Seabron Lenis, MD   Brief Narrative: Patient is a 89 year old male with a history of upper GI bleed, duodenal ulcer, hypertension, metastatic prostate cancer to bone, recurrent falls who presented with a fall from home,weakness.  She lives alone.  Ambulates with the help of walker.  He felt weak and fell on the floor of the bathtub and could not get up for around 16 hours .on presentation he was hypertensive.  CK level was in the range of 800s.  Right shoulder x-ray did not show any fracture or dislocation.  CT head did not show any acute findings as well as CT cervical spine.  Showed multilevel cervical degenerative changes, stable sclerotic metastasis at the T1 vertebral body.   Patient admitted for the management of generalized weakness, rhabdomyolysis.  Started on IV fluid.  PT consulted.  Recommended home health.  Patient states he does not want to live alone and wants to go to SNF for long-term .He lives alone.  TOC consulted and following for SNF.  Appeared volume overloaded on 12/2, being given IV Lasix , echo pending.  Palliative care also consulted for goals of care  Assessment & Plan:  Principal Problem:   Rhabdomyolysis Active Problems:   Prostate cancer metastatic to bone [facility])   Essential hypertension   Thrombocytopenia   Sinus bradycardia   Generalized weakness   B12 deficiency   Macrocytosis   Mild protein malnutrition   Falls   Generalized weakness/recurrent falls/rhabdomyolysis: Given iv fluid.  Lives alone.  Ambulates with the help of walker.  He has 2 nieces.  He wants to go to SNF for long-term.  TOC consulted. He has high risk for fall at home and may be unsafe for him to be at home alone. I had a long discussion with his niece's  husband on 11/29.  He said himself and his niece are in the 61s and they are unable to take care of him anymore.  They are requesting if he can be placed in a  facility for long-term care.  PT recommended SNF.  Dyspnea/elevated BNP: Appeared slightly short of breath on 12/2 but saturating fine on room air.  Chest x-ray showed mild features of pulmonary edema.  Had fine crackles in bilateral bases.  Given a dose of Lasix  IV once 40 mg on 12/2.  Elevated BNP.  Will give another dose today.  Checking echocardiogram.  No peripheral edema.  Remains on room air  Hypokalemia: Given potassium supplementation, being monitored  Hypertension:   Takes losartan  at home.  Restarted  Metastatic prostate cancer to bone/urinary transition: Continue Flomax .  Patient intermittently retaining urine.  Placed a Foley catheter.  Will give voiding trial in next 24 to 48 hours  Sinus bradycardia: Stable.  Avoid AV nodal blocking agents  Thrombocytopenia: Stable.  Continue to monitor  History of vitamin B12 deficiency: Continue supplementation  Constipation: Continue bowel regimen  Chronic back pain: On oxycodone  at home,continued  Goals of care: Very elderly patient with metastatic prostate cancer.  Extremely deconditioned.  Still remains full code.  Lives alone.  Consulted  palliative care for goals of care.     Nutrition Problem: Inadequate oral intake Etiology: poor appetite    DVT prophylaxis:enoxaparin  (LOVENOX ) injection 40 mg Start: 03/30/24 2200     Code Status: Full Code  Family Communication: Called and discussed with his niece's husband on phone on 11/29  Patient status: Observation  Patient is from :  Home  Anticipated discharge to: SNF   Estimated DC date: 2-3 days   Consultants: None  Procedures: None  Antimicrobials:  Anti-infectives (From admission, onward)    None       Subjective: Patient seen and examined at bedside today.  Overall comfortable.  Lying on bed.  Foley had to be placed for urinary retention yesterday.  No peripheral edema but has fine crackles at bilateral bases.  Being given another dose of Lasix .  Denies  shortness of breath or cough.  Objective: Vitals:   04/04/24 2028 04/05/24 0446 04/05/24 0809 04/05/24 0847  BP:  129/70 128/65   Pulse:  78 74   Resp:  18 18   Temp:  98.4 F (36.9 C) 97.6 F (36.4 C)   TempSrc:  Oral Oral   SpO2: 96% 95% 96% 95%  Weight:      Height:        Intake/Output Summary (Last 24 hours) at 04/05/2024 1056 Last data filed at 04/05/2024 0500 Gross per 24 hour  Intake 360 ml  Output 1400 ml  Net -1040 ml   Filed Weights   03/30/24 1823  Weight: 76.8 kg    Examination:     General exam: Overall comfortable, not in distress, pleasant elderly gentleman, chronically deconditioned and weak HEENT: PERRL Respiratory system: Mild bilateral basal crackles Cardiovascular system: S1 & S2 heard, RRR.  Gastrointestinal system: Abdomen is nondistended, soft and nontender. Central nervous system: Alert and oriented Extremities: No edema, no clubbing ,no cyanosis Skin: No rashes, no ulcers,no icterus     Data Reviewed: I have personally reviewed following labs and imaging studies  CBC: Recent Labs  Lab 03/30/24 1215 03/31/24 0511 04/03/24 0444 04/04/24 0557  WBC 10.0 6.5 11.3* 5.7  NEUTROABS 6.6  --   --   --   HGB 13.0 11.7* 9.8* 10.1*  HCT 40.1 36.8* 31.4* 31.7*  MCV 101.8* 103.4* 106.4* 103.9*  PLT 147* 120* 114* 121*   Basic Metabolic Panel: Recent Labs  Lab 03/30/24 1215 03/31/24 0511 04/03/24 0444 04/05/24 0515  NA 139 136 137 138  K 3.6 3.6 3.5 3.1*  CL 108 107 109 106  CO2 22 20* 20* 22  GLUCOSE 95 78 109* 95  BUN 16 14 11 17   CREATININE 0.99 0.91 1.04 1.12  CALCIUM 8.3* 7.8* 8.0* 8.3*  MG  --   --   --  2.3     Recent Results (from the past 240 hours)  Resp panel by RT-PCR (RSV, Flu A&B, Covid) Anterior Nasal Swab     Status: None   Collection Time: 03/30/24  1:23 PM   Specimen: Anterior Nasal Swab  Result Value Ref Range Status   SARS Coronavirus 2 by RT PCR NEGATIVE NEGATIVE Final    Comment: (NOTE) SARS-CoV-2 target  nucleic acids are NOT DETECTED.  The SARS-CoV-2 RNA is generally detectable in upper respiratory specimens during the acute phase of infection. The lowest concentration of SARS-CoV-2 viral copies this assay can detect is 138 copies/mL. A negative result does not preclude SARS-Cov-2 infection and should not be used as the sole basis for treatment or other patient management decisions. A negative result may occur with  improper specimen collection/handling, submission of specimen other than nasopharyngeal swab, presence of viral mutation(s) within the areas targeted by this assay, and inadequate number of viral copies(<138 copies/mL). A negative result must be combined with clinical observations, patient history, and epidemiological information. The expected result is Negative.  Fact Sheet for Patients:  bloggercourse.com  Fact Sheet for Healthcare Providers:  seriousbroker.it  This test is no t yet approved or cleared by the [place]  FDA and  has been authorized for detection and/or diagnosis of SARS-CoV-2 by FDA under an Emergency Use Authorization (EUA). This EUA will remain  in effect (meaning this test can be used) for the duration of the COVID-19 declaration under Section 564(b)(1) of the Act, 21 U.S.C.section 360bbb-3(b)(1), unless the authorization is terminated  or revoked sooner.       Influenza A by PCR NEGATIVE NEGATIVE Final   Influenza B by PCR NEGATIVE NEGATIVE Final    Comment: (NOTE) The Xpert Xpress SARS-CoV-2/FLU/RSV plus assay is intended as an aid in the diagnosis of influenza from Nasopharyngeal swab specimens and should not be used as a sole basis for treatment. Nasal washings and aspirates are unacceptable for Xpert Xpress SARS-CoV-2/FLU/RSV testing.  Fact Sheet for Patients: bloggercourse.com  Fact Sheet for Healthcare  Providers: seriousbroker.it  This test is not yet approved or cleared by the [place]  FDA and has been authorized for detection and/or diagnosis of SARS-CoV-2 by FDA under an Emergency Use Authorization (EUA). This EUA will remain in effect (meaning this test can be used) for the duration of the COVID-19 declaration under Section 564(b)(1) of the Act, 21 U.S.C. section 360bbb-3(b)(1), unless the authorization is terminated or revoked.     Resp Syncytial Virus by PCR NEGATIVE NEGATIVE Final    Comment: (NOTE) Fact Sheet for Patients: bloggercourse.com  Fact Sheet for Healthcare Providers: seriousbroker.it  This test is not yet approved or cleared by the [place]  FDA and has been authorized for detection and/or diagnosis of SARS-CoV-2 by FDA under an Emergency Use Authorization (EUA). This EUA will remain in effect (meaning this test can be used) for the duration of the COVID-19 declaration under Section 564(b)(1) of the Act, 21 U.S.C. section 360bbb-3(b)(1), unless the authorization is terminated or revoked.  Performed at [facility], [address], [place], [place] [address]      Radiology Studies: DG CHEST PORT 1 VIEW Result Date: 04/04/2024 CLINICAL DATA:  Short of breath EXAM: PORTABLE CHEST 1 VIEW COMPARISON:  Prior chest x-ray 11/02/2022 FINDINGS: Increased pulmonary vascular congestion with interstitial prominence and bibasilar atelectasis. Findings suggest mild interstitial edema. Stable mild cardiomegaly. Atherosclerotic calcifications are present in the transverse aorta. Probable trace bilateral pleural effusions. No pneumothorax. No acute osseous abnormality. IMPRESSION: 1. Findings are most consistent with mild CHF. Stable cardiomegaly with increased pulmonary vascular congestion, mild interstitial edema and likely small bilateral pleural effusions. Electronically  Signed   By: Wilkie Lent M.D.   On: 04/04/2024 09:59     Scheduled Meds:  abiraterone  acetate  1,000 mg Oral Daily   Chlorhexidine  Gluconate Cloth  6 each Topical Daily   cyanocobalamin   1,000 mcg Oral Daily   enoxaparin  (LOVENOX ) injection  40 mg Subcutaneous Q24H   feeding supplement  237 mL Oral BID BM   furosemide   40 mg Intravenous Once   guaiFENesin  600 mg Oral BID   losartan   100 mg Oral q AM   multivitamin with minerals  1 tablet Oral Daily   polyethylene glycol  17 g Oral Daily   potassium chloride   40 mEq Oral Q2H   senna-docusate  1 tablet Oral BID   tamsulosin   0.4 mg Oral q AM   Continuous Infusions:     LOS: 0 days   Ivonne Mustache, MD [facility] P12/07/2023, 10:56 AM

## 2024-04-06 ENCOUNTER — Other Ambulatory Visit: Payer: Self-pay

## 2024-04-06 DIAGNOSIS — K449 Diaphragmatic hernia without obstruction or gangrene: Secondary | ICD-10-CM | POA: Diagnosis not present

## 2024-04-06 DIAGNOSIS — E538 Deficiency of other specified B group vitamins: Secondary | ICD-10-CM | POA: Diagnosis present

## 2024-04-06 DIAGNOSIS — Z1152 Encounter for screening for COVID-19: Secondary | ICD-10-CM | POA: Diagnosis not present

## 2024-04-06 DIAGNOSIS — C7951 Secondary malignant neoplasm of bone: Secondary | ICD-10-CM | POA: Diagnosis present

## 2024-04-06 DIAGNOSIS — Y92091 Bathroom in other non-institutional residence as the place of occurrence of the external cause: Secondary | ICD-10-CM | POA: Diagnosis not present

## 2024-04-06 DIAGNOSIS — Z515 Encounter for palliative care: Secondary | ICD-10-CM | POA: Diagnosis not present

## 2024-04-06 DIAGNOSIS — R296 Repeated falls: Secondary | ICD-10-CM | POA: Diagnosis not present

## 2024-04-06 DIAGNOSIS — N401 Enlarged prostate with lower urinary tract symptoms: Secondary | ICD-10-CM | POA: Diagnosis present

## 2024-04-06 DIAGNOSIS — E441 Mild protein-calorie malnutrition: Secondary | ICD-10-CM | POA: Diagnosis present

## 2024-04-06 DIAGNOSIS — Y93E1 Activity, personal bathing and showering: Secondary | ICD-10-CM | POA: Diagnosis not present

## 2024-04-06 DIAGNOSIS — D539 Nutritional anemia, unspecified: Secondary | ICD-10-CM | POA: Diagnosis present

## 2024-04-06 DIAGNOSIS — Z8616 Personal history of COVID-19: Secondary | ICD-10-CM | POA: Diagnosis not present

## 2024-04-06 DIAGNOSIS — K224 Dyskinesia of esophagus: Secondary | ICD-10-CM | POA: Diagnosis not present

## 2024-04-06 DIAGNOSIS — I10 Essential (primary) hypertension: Secondary | ICD-10-CM | POA: Diagnosis present

## 2024-04-06 DIAGNOSIS — Z7189 Other specified counseling: Secondary | ICD-10-CM | POA: Diagnosis not present

## 2024-04-06 DIAGNOSIS — J479 Bronchiectasis, uncomplicated: Secondary | ICD-10-CM | POA: Diagnosis present

## 2024-04-06 DIAGNOSIS — R338 Other retention of urine: Secondary | ICD-10-CM | POA: Diagnosis present

## 2024-04-06 DIAGNOSIS — J810 Acute pulmonary edema: Secondary | ICD-10-CM | POA: Diagnosis present

## 2024-04-06 DIAGNOSIS — C61 Malignant neoplasm of prostate: Secondary | ICD-10-CM | POA: Diagnosis present

## 2024-04-06 DIAGNOSIS — E878 Other disorders of electrolyte and fluid balance, not elsewhere classified: Secondary | ICD-10-CM | POA: Diagnosis not present

## 2024-04-06 DIAGNOSIS — R1310 Dysphagia, unspecified: Secondary | ICD-10-CM | POA: Diagnosis not present

## 2024-04-06 DIAGNOSIS — T796XXA Traumatic ischemia of muscle, initial encounter: Secondary | ICD-10-CM | POA: Diagnosis not present

## 2024-04-06 DIAGNOSIS — M6282 Rhabdomyolysis: Secondary | ICD-10-CM | POA: Diagnosis not present

## 2024-04-06 DIAGNOSIS — F419 Anxiety disorder, unspecified: Secondary | ICD-10-CM | POA: Diagnosis present

## 2024-04-06 DIAGNOSIS — R54 Age-related physical debility: Secondary | ICD-10-CM | POA: Diagnosis present

## 2024-04-06 DIAGNOSIS — W182XXA Fall in (into) shower or empty bathtub, initial encounter: Secondary | ICD-10-CM | POA: Diagnosis present

## 2024-04-06 DIAGNOSIS — E876 Hypokalemia: Secondary | ICD-10-CM | POA: Diagnosis present

## 2024-04-06 DIAGNOSIS — D696 Thrombocytopenia, unspecified: Secondary | ICD-10-CM | POA: Diagnosis present

## 2024-04-06 DIAGNOSIS — Z66 Do not resuscitate: Secondary | ICD-10-CM | POA: Diagnosis not present

## 2024-04-06 DIAGNOSIS — G4700 Insomnia, unspecified: Secondary | ICD-10-CM | POA: Diagnosis present

## 2024-04-06 DIAGNOSIS — R7989 Other specified abnormal findings of blood chemistry: Secondary | ICD-10-CM | POA: Diagnosis not present

## 2024-04-06 DIAGNOSIS — F5105 Insomnia due to other mental disorder: Secondary | ICD-10-CM | POA: Diagnosis present

## 2024-04-06 DIAGNOSIS — I452 Bifascicular block: Secondary | ICD-10-CM | POA: Diagnosis present

## 2024-04-06 LAB — BASIC METABOLIC PANEL WITH GFR
Anion gap: 7 (ref 5–15)
BUN: 18 mg/dL (ref 8–23)
CO2: 24 mmol/L (ref 22–32)
Calcium: 8.3 mg/dL — ABNORMAL LOW (ref 8.9–10.3)
Chloride: 107 mmol/L (ref 98–111)
Creatinine, Ser: 1.27 mg/dL — ABNORMAL HIGH (ref 0.61–1.24)
GFR, Estimated: 52 mL/min — ABNORMAL LOW (ref >60)
Glucose, Bld: 93 mg/dL (ref 70–99)
Potassium: 3.5 mmol/L (ref 3.5–5.1)
Sodium: 138 mmol/L (ref 135–145)

## 2024-04-06 MED ORDER — DM-GUAIFENESIN ER 30-600 MG PO TB12
1.0000 | ORAL_TABLET | Freq: Two times a day (BID) | ORAL | Status: DC
  Administered 2024-04-06 – 2024-04-21 (×27): 1 via ORAL
  Filled 2024-04-06 (×29): qty 1

## 2024-04-06 MED ORDER — POLYVINYL ALCOHOL 1.4 % OP SOLN
1.0000 [drp] | Freq: Three times a day (TID) | OPHTHALMIC | Status: DC
  Administered 2024-04-06 – 2024-04-21 (×45): 1 [drp] via OPHTHALMIC
  Filled 2024-04-06: qty 15

## 2024-04-06 MED ORDER — BISACODYL 10 MG RE SUPP
10.0000 mg | Freq: Once | RECTAL | Status: AC
  Administered 2024-04-06: 10 mg via RECTAL
  Filled 2024-04-06: qty 1

## 2024-04-06 MED ORDER — GUAIFENESIN ER 600 MG PO TB12: 600.0000 mg | ORAL_TABLET | Freq: Two times a day (BID) | ORAL | Status: DC

## 2024-04-06 MED ORDER — PREDNISONE 5 MG PO TABS
5.0000 mg | ORAL_TABLET | Freq: Every day | ORAL | Status: DC
  Administered 2024-04-08 – 2024-04-21 (×14): 5 mg via ORAL
  Filled 2024-04-06 (×15): qty 1

## 2024-04-06 NOTE — Progress Notes (Signed)
  Daily Progress Note   Patient Name: Alexander Rich       Date: 04/06/2024 DOB: [birth year]  Age: 89 y.o. MRN#: [id number] Attending Physician: Rizwan, Saima, MD Primary Care Physician: Seabron Lenis, MD Admit Date: 03/30/2024 Length of Stay: 0 days  Reason for Consultation/Follow-up: Establishing goals of care  Subjective:   Reviewed EMR including recent documentation from hospitalist.  Personally reviewed recent BMP noting creatinine slightly elevated to 1.27 with GFR of 52.  Patient had received Lasix  yesterday due to concerns of bibasilar lung crackles.  Echo completed on 06/07/2023 noted ejection fraction estimated to be 60-65% with normal left ventricle and right ventricle function.  When seeing patient today, patient was able to use bedside commode with assistance.  Noted continuing current medical management and TOC assisting with discharge planning.  Objective:   Vital Signs:  BP (!) 150/82 (BP Location: Left Arm)   Pulse 66   Temp 98.5 F (36.9 C) (Oral)   Resp 18   Ht 5' 7 (1.702 m)   Wt 76.8 kg   SpO2 98%   BMI 26.52 kg/m   Physical Exam: General: NAD, alert, chronically ill-appearing, elderly Cardiovascular: RRR Respiratory: not in respiratory distress Abdomen: not distended Neuro: A&Ox4, following commands easily Psych: appropriately answers all questions  Assessment & Plan:   Assessment: Patient is a 89 year old male with a past medical history of upper GI bleed, duodenal ulcer, hypertension, metastatic prostate cancer to bone, chronic back pain, and recurrent falls who was admitted on 03/30/2024 after a fall at home.  Patient lived alone and normally ambulated with help of a walker.  Patient noted that he had a fall at home on the floor of the bathtub and could not get up for around 16 hours.  Since being hospitalized, patient has received management for rhabdomyolysis, electrolyte abnormalities, and elevated BNP.  Palliative medicine team consulted to assist  with complex medical decision making.   Recommendations/Plan: # Complex medical decision making/goals of care:     - As per discussions with patient, currently continuing appropriate medical interventions.  Patient seeking long-term care placement as unsafe to return home on his own.  Patient has already stated that if he was unable to make medical decisions for himself, he would want his niece, [name], to make medical decisions on his behalf.  Clara working to determine if patient has ACP documentation naming her HCPOA.  Already noted hospital can assist with this paperwork if needed.  Palliative medicine team continuing to follow along with patient's medical journey.                -  Code Status: Limited: Do not attempt resuscitation (DNR) -DNR-LIMITED -Do Not Intubate/DNI    # Psycho-social/Spiritual Support:  - Support System: Niece-[name]   # Discharge Planning:  To Be Determined   Thank you for allowing the palliative care team to participate in the care Ell[place]e JINNY Saran.  Tinnie Radar, DO Palliative Care Provider PMT # [phone]  If patient remains symptomatic despite maximum doses, please call PMT at [phone] between 0700 and 1900. Outside of these hours, please call attending, as PMT does not have night coverage.

## 2024-04-06 NOTE — Progress Notes (Signed)
 [facility] Progress Note  Patient: Alexander Rich     FMW:[id number]  DOA: 03/30/2024   PCP: Seabron Lenis, MD       Brief hospital course: This is a 89 year old male with history of duodenal ulcer, upper GI bleed, hypertension, prostate cancer metastatic to the bone who presented to the hospital for recurrent falls and generalized weakness.  The patient fell at home and could not get up for about 16 hours. In the ED: CK 894.  It rose to 998 and subsequently improved after being treated with IV fluids.  In the ED, the patient was able to walk about 50 feet without difficulty with a walker PT consulted and recommended home health.  The patient  stated that he was not comfortable with going home because he lives alone.  Patient noted to be volume overloaded on 12/2 and IV Lasix  was given.  Echo was ordered.  Subjective:  No complaints.     Assessment and Plan: Principal Problem:   Rhabdomyolysis/generalized weakness with prolonged - Rhabdomyolysis resolved  Active Problems:   Prostate cancer metastatic to bone (HCC) - Continue Flomax   Urinary retention - Attempt voiding trial today-Will need to reenter Foley if he has postvoid residuals that are high  Dyspnea and elevated BNP - Chest x-ray was suggestive of pulmonary edema on 2/2-she was noted of having some shortness of breath and crackles and IV Lasix  was given - 2D echo completed and shows a normal EF diastolic dysfunction  Hypertension Continue losartan   Chronic thrombocytopenia  Generalized weakness   Patient is medically stable for discharge-waiting on SNF      Code Status: Limited: Do not attempt resuscitation (DNR) -DNR-LIMITED -Do Not Intubate/DNI  Total time on patient care: 35 minutes DVT prophylaxis:  enoxaparin  (LOVENOX ) injection 40 mg Start: 03/30/24 2200     Objective:   Vitals:   04/05/24 0847 04/05/24 1417 04/05/24 2024 04/06/24 0451  BP:  (!) 115/50 105/65 (!) 150/82  Pulse:  61 66 66   Resp:  16 18 18   Temp:  98.4 F (36.9 C) (!) 97.5 F (36.4 C) 98.5 F (36.9 C)  TempSrc:  Oral Oral Oral  SpO2: 95% 98% 100% 98%  Weight:      Height:       Filed Weights   03/30/24 1823  Weight: 76.8 kg   Exam: General exam: Appears comfortable  HEENT: oral mucosa moist Respiratory system: Clear to auscultation.  Has a cough. Cardiovascular system: S1 & S2 heard  Gastrointestinal system: Abdomen soft, non-tender, nondistended. Normal bowel sounds   Extremities: No cyanosis, clubbing or edema Psychiatry:  Mood & affect appropriate.      CBC: Recent Labs  Lab 03/30/24 1215 03/31/24 0511 04/03/24 0444 04/04/24 0557  WBC 10.0 6.5 11.3* 5.7  NEUTROABS 6.6  --   --   --   HGB 13.0 11.7* 9.8* 10.1*  HCT 40.1 36.8* 31.4* 31.7*  MCV 101.8* 103.4* 106.4* 103.9*  PLT 147* 120* 114* 121*   Basic Metabolic Panel: Recent Labs  Lab 03/30/24 1215 03/31/24 0511 04/03/24 0444 04/05/24 0515  NA 139 136 137 138  K 3.6 3.6 3.5 3.1*  CL 108 107 109 106  CO2 22 20* 20* 22  GLUCOSE 95 78 109* 95  BUN 16 14 11 17   CREATININE 0.99 0.91 1.04 1.12  CALCIUM 8.3* 7.8* 8.0* 8.3*  MG  --   --   --  2.3     Scheduled Meds:  abiraterone  acetate  1,000  mg Oral Daily   Chlorhexidine  Gluconate Cloth  6 each Topical Daily   cyanocobalamin   1,000 mcg Oral Daily   enoxaparin  (LOVENOX ) injection  40 mg Subcutaneous Q24H   feeding supplement  237 mL Oral BID BM   guaiFENesin  600 mg Oral BID   losartan   100 mg Oral q AM   multivitamin with minerals  1 tablet Oral Daily   polyethylene glycol  17 g Oral Daily   senna-docusate  1 tablet Oral BID   tamsulosin   0.4 mg Oral q AM    Imaging and lab data personally reviewed   Author: Broox Lonigro  04/06/2024 8:06 AM  To contact [facility]>   Check the care team in [facility] and look for the attending/consulting [facility] provider listed  Log into [email or web address] and use [place]'s universal password   Go to> [facility]  and  find provider  If you still have difficulty reaching the provider, please page the [facility] (Director on Call) for the Hospitalists listed on amion

## 2024-04-06 NOTE — Progress Notes (Signed)
 Physical Therapy Treatment Patient Details Name: Alexander Rich MRN: [id number] DOB: [birth year] Today's Date: 04/06/2024   History of Present Illness 89 yo male admitted to hospital on 03/30/2024 due to 2 falls secondary to progressive weakness, pt found to have rhabdomyolysis. 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    PT Comments  Pt reports he is tired, declines OOB or amb but is agreeable to exercises in bed. Pt is alert, cooperative but requires tactile cues for participation in LE exercises.  D/c plan remains appropriate at this time. Continue to follow in acute setting.    If plan is discharge home, recommend the following: A little help with walking and/or transfers;A little help with bathing/dressing/bathroom;Assistance with cooking/housework;Assist for transportation;Help with stairs or ramp for entrance   Can travel by private vehicle     Yes  Equipment Recommendations  None recommended by PT    Recommendations for Other Services       Precautions / Restrictions Precautions Precautions: Fall Recall of Precautions/Restrictions: Intact Restrictions Weight Bearing Restrictions Per Provider Order: No     Mobility  Bed Mobility Overal bed mobility: Needs Assistance Bed Mobility: Rolling Rolling: Supervision, Used rails         General bed mobility comments: pt declines OOB stating he is  tired and hips are achy    Transfers                        Ambulation/Gait                   Stairs             Wheelchair Mobility     Tilt Bed    Modified Rankin (Stroke Patients Only)       Balance                                            Communication Communication Communication: No apparent difficulties  Cognition Arousal: Alert Behavior During Therapy: WFL for tasks assessed/performed   PT -  Cognitive impairments: No apparent impairments                         Following commands: Intact      Cueing Cueing Techniques: Verbal cues  Exercises General Exercises - Lower Extremity Ankle Circles/Pumps: AROM, Both, 10 reps (tactile cues) Quad Sets: AROM, Both, 10 reps Short Arc Quad: AROM, AAROM, Both (7 reps with tactile cues) Heel Slides: AAROM, Both, 10 reps    General Comments        Pertinent Vitals/Pain Pain Assessment Pain Assessment: Faces Faces Pain Scale: Hurts little more Pain Location: hips Pain Descriptors / Indicators: Aching Pain Intervention(s): Limited activity within patient's tolerance, Monitored during session, Repositioned    Home Living                          Prior Function            PT Goals (current goals can now be found in the care plan section) Acute Rehab PT Goals Patient Stated Goal: to go to a home and get some help PT Goal Formulation: With patient Time For Goal Achievement: 04/14/24 Potential to Achieve  Goals: Fair Progress towards PT goals: Progressing toward goals    Frequency    Min 2X/week      PT Plan      Co-evaluation              AM-PAC PT 6 Clicks Mobility   Outcome Measure  Help needed turning from your back to your side while in a flat bed without using bedrails?: A Little Help needed moving from lying on your back to sitting on the side of a flat bed without using bedrails?: A Little Help needed moving to and from a bed to a chair (including a wheelchair)?: A Little Help needed standing up from a chair using your arms (e.g., wheelchair or bedside chair)?: A Little Help needed to walk in hospital room?: A Lot Help needed climbing 3-5 steps with a railing? : A Lot 6 Click Score: 16    End of Session   Activity Tolerance: Patient limited by pain;Patient limited by fatigue Patient left: in bed;with call bell/phone within reach;with bed alarm set   PT Visit Diagnosis:  Unsteadiness on feet (R26.81);Other abnormalities of gait and mobility (R26.89);Repeated falls (R29.6);Muscle weakness (generalized) (M62.81);Difficulty in walking, not elsewhere classified (R26.2)     Time: 8455-8442 PT Time Calculation (min) (ACUTE ONLY): 13 min  Charges:    $Therapeutic Exercise: 8-22 mins PT General Charges $$ ACUTE PT VISIT: 1 Visit                     Orianna Biskup, PT  Acute Rehab Dept [facility]) [phone]  04/06/2024    [facility] 04/06/2024, 4:12 PM

## 2024-04-06 NOTE — Plan of Care (Signed)
   Problem: Activity: Goal: Risk for activity intolerance will decrease Outcome: Progressing   Problem: Nutrition: Goal: Adequate nutrition will be maintained Outcome: Progressing   Problem: Pain Managment: Goal: General experience of comfort will improve and/or be controlled Outcome: Progressing

## 2024-04-07 ENCOUNTER — Other Ambulatory Visit: Payer: Self-pay

## 2024-04-07 DIAGNOSIS — C7951 Secondary malignant neoplasm of bone: Secondary | ICD-10-CM | POA: Diagnosis not present

## 2024-04-07 DIAGNOSIS — C61 Malignant neoplasm of prostate: Secondary | ICD-10-CM | POA: Diagnosis not present

## 2024-04-07 DIAGNOSIS — M6282 Rhabdomyolysis: Secondary | ICD-10-CM | POA: Diagnosis not present

## 2024-04-07 MED ORDER — ALPRAZOLAM 0.25 MG PO TABS
0.2500 mg | ORAL_TABLET | Freq: Three times a day (TID) | ORAL | Status: DC | PRN
  Administered 2024-04-07 (×2): 0.25 mg via ORAL
  Filled 2024-04-07 (×2): qty 1

## 2024-04-07 MED ORDER — ENSURE PLUS HIGH PROTEIN PO LIQD
237.0000 mL | Freq: Three times a day (TID) | ORAL | Status: DC
  Administered 2024-04-07 – 2024-04-21 (×25): 237 mL via ORAL

## 2024-04-07 MED ORDER — ZOLPIDEM TARTRATE 5 MG PO TABS
5.0000 mg | ORAL_TABLET | Freq: Every evening | ORAL | Status: DC | PRN
  Administered 2024-04-08 – 2024-04-20 (×9): 5 mg via ORAL
  Filled 2024-04-07 (×10): qty 1

## 2024-04-07 NOTE — Progress Notes (Signed)
  Daily Progress Note   Patient Name: Alexander Rich       Date: 04/07/2024 DOB: 04-Jun-1925  Age: 89 y.o. MRN#: [id number] Attending Physician: Rizwan, Saima, MD Primary Care Physician: Seabron Lenis, MD Admit Date: 03/30/2024 Length of Stay: 1 day  Reason for Consultation/Follow-up: Establishing goals of care  Subjective:   Reviewed EMR including recent documentation from hospitalist and TOC.  Hospitalist discussed starting medication for patient's anxiety.  Still planning on placement at SNF with palliative support.  Presented to bedside to see patient.  Patient laying comfortably in bed.  Patient noted it has been discussed with him plan that he is to go to rehab with palliative support.  Discussed would not nutritionally plan for rehab with palliative and could eventually transition over to hospice support when needed.  Patient acknowledged this.  Discussed hospitalist has started medication for patient's anxiety so he can try this as needed to determine if needs to be further adjusted.  Spent time providing emotional support via active listening.  All questions answered at that time.  Noted palliative medicine team will continue to follow along with patient's medical journey.  Discussed care with hospitalist, TOC, and bedside RN to coordinate care.  Objective:   Vital Signs:  BP (!) 151/63 (BP Location: Right Arm)   Pulse 81   Temp 97.6 F (36.4 C) (Oral)   Resp 16   Ht 5' 7 (1.702 m)   Wt 76.8 kg   SpO2 96%   BMI 26.52 kg/m   Physical Exam: General: NAD, alert, chronically ill-appearing, elderly Cardiovascular: RRR Respiratory: not in respiratory distress Abdomen: not distended Neuro: Awake, interactive Psych: appropriately answers all questions  Assessment & Plan:   Assessment: Patient is a 89 year old male with a past medical history of upper GI bleed, duodenal ulcer, hypertension, metastatic prostate cancer to bone, chronic back pain, and recurrent falls who was  admitted on 03/30/2024 after a fall at home.  Patient lived alone and normally ambulated with help of a walker.  Patient noted that he had a fall at home on the floor of the bathtub and could not get up for around 16 hours.  Since being hospitalized, patient has received management for rhabdomyolysis, electrolyte abnormalities, and elevated BNP.  Palliative medicine team consulted to assist with complex medical decision making.   Recommendations/Plan: # Complex medical decision making/goals of care:     - Discussed care with patient as detailed above in HPI.  Plan for patient to go to SNF with palliative support.  Can transition over to hospice if needed.  TOC and hospitalist coordinating discharge planning and have been speaking to patient's niece regarding care planning.  Palliative medicine team continuing to follow along with patient's medical journey.                -  Code Status: Limited: Do not attempt resuscitation (DNR) -DNR-LIMITED -Do Not Intubate/DNI    # Psycho-social/Spiritual Support:  - Support System: Niece-[name]   # Discharge Planning: SNF with palliative support, TOC arranging   Thank you for allowing the palliative care team to participate in the care Ellaree JINNY Saran.  Tinnie Radar, DO Palliative Care Provider PMT # [phone]  If patient remains symptomatic despite maximum doses, please call PMT at [phone] between 0700 and 1900. Outside of these hours, please call attending, as PMT does not have night coverage.

## 2024-04-07 NOTE — TOC Progression Note (Addendum)
 Transition of Care [facility]) - Progression Note    Patient Details  Name: Alexander Rich MRN: [id number] Date of Birth: August 23, 1925  Transition of Care [facility]) CM/SW Contact  Doneta Glenys DASEN, RN Phone Number: 04/07/2024, 9:54 AM  Clinical Narrative:    Choice List Given. Service Provider Request Status STAR(s) Address Phone Patient Preferred  [facility] Preferred SNF  Accepted 2 [address] [place] [address] [phone]   [facility] Preferred SNF  Accepted 4 [address], [place] [address] [phone]   [facility]  Accepted 2 [address], [place] [address] [phone]   [facility] [place] [facility]  Accepted 4 [address] [place] [address] [phone]   [facility]  Accepted 3 [address], [place] [address] [phone]   [facility] SNF  Accepted 3 [address] [place] [address] [phone]   [facility]  Accepted 1 [address] [place] [address] [phone]   [facility] Preferred SN[facility]  Accepted 3 [address] [place] [address] [phone]   11:02 AM CM spoke with patient, Proffitt,Clara ([phone]  and Jim(Clara's husband) on the phone in the room. 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 CM updated [facility] that patient accepted bed offer. [facility] will not be able to accept until Monday (12/8). CM will start insurance auth. 12:19 PM [employer] auth started [place] ID [id number]  Expected Discharge Plan: Skilled Nursing Facility Barriers to Discharge: Continued Medical Work up               Expected Discharge Plan and Services In-house Referral: NA Discharge Planning Services: CM Consult Post Acute Care Choice: Skilled Nursing Facility Living arrangements for the past 2 months: Single Family Home                 DME Arranged:  N/A DME Agency: NA       HH Arranged: NA HH Agency: NA         Social Drivers of Health (SDOH) Interventions SDOH Screenings   Food Insecurity: No Food Insecurity (03/30/2024)  Housing: Low Risk  (03/30/2024)  Transportation Needs: No Transportation Needs (03/30/2024)  Utilities: Not At Risk (03/30/2024)  Depression (PHQ2-9): Low Risk  (01/05/2024)  Social Connections: Moderately Integrated (03/30/2024)  Tobacco Use: Low Risk  (03/30/2024)    Readmission Risk Interventions    11/04/2022    1:30 PM  Readmission Risk Prevention Plan  Transportation Screening Complete  PCP or Specialist Appt within 3-5 Days Complete  HRI or Home Care Consult Complete  Social Work Consult for Recovery Care Planning/Counseling Complete  Palliative Care Screening Not Applicable  Medication Review [facility]) Complete

## 2024-04-07 NOTE — Progress Notes (Addendum)
 [facility] Progress Note  Patient: Alexander Rich     FMW:[id number]  DOA: 03/30/2024   PCP: Seabron Lenis, MD       Brief hospital course: This is a 89 year old male with history of duodenal ulcer, upper GI bleed, hypertension, prostate cancer metastatic to the bone who presented to the hospital for recurrent falls and generalized weakness.  The patient fell at home in the bathtub and could not get up for about 16 hours. In the ED: CK 894.  It rose to 998 and subsequently improved after being treated with IV fluids.  In the ED, the patient was able to walk about 50 feet without difficulty with a walker PT consulted and recommended home health.  The patient  stated that he was not comfortable with going home because he lives alone.  Patient noted to be volume overloaded on 12/2 and IV Lasix  was given.  Echo was ordered.  Subjective:  He states that he is feeling very anxious about being in the hospital, having to lay in the bed and not fully understanding what is going on with him.  We discussed his metastatic cancer and it seems that he was not aware that he has bone metastasis.  We discussed the plan to go to skilled nursing facility.  He stated that he no longer feels comfortable living home alone and does not want to return home.  He has repeatedly asked me for something to control his anxiety.    Assessment and Plan: Principal Problem:   Rhabdomyolysis/generalized weakness with prolonged - Rhabdomyolysis resolved  Active Problems:   Prostate cancer metastatic to bone (HCC) - Continue Flomax   Anxiety Insomnia -   we have discussed the indications and the side effects and have decided to start a low-dose of Xanax  and Ambien   Urinary retention - foley removed 12/4- voided with 1 cc post void residual  Dyspnea and elevated BNP - Chest x-ray was suggestive of pulmonary edema on 2/2-she was noted of having some shortness of breath and crackles and IV Lasix  was given - 2D  echo completed and shows a normal EF diastolic dysfunction  Hypertension Continue losartan   Chronic thrombocytopenia  Generalized weakness   Disposition:  frail, poor oral intake, poor mobility, with metastatic prostate cancer- he no longer is able to care for himself- currently  looking for SNF - recommend an SNF with with sticks palliative care can following closely as he has a poor prognosis      Code Status: Limited: Do not attempt resuscitation (DNR) -DNR-LIMITED -Do Not Intubate/DNI  Total time on patient care: 35 minutes DVT prophylaxis:  enoxaparin  (LOVENOX ) injection 40 mg Start: 03/30/24 2200     Objective:   Vitals:   04/06/24 0451 04/06/24 1531 04/06/24 2021 04/07/24 0614  BP: (!) 150/82 122/60 126/68 (!) 151/63  Pulse: 66 64 65 81  Resp: 18 16    Temp: 98.5 F (36.9 C) 97.7 F (36.5 C) 98.1 F (36.7 C) 97.6 F (36.4 C)  TempSrc: Oral Oral Oral Oral  SpO2: 98% 100% 99% 96%  Weight:      Height:       Filed Weights   03/30/24 1823  Weight: 76.8 kg   Exam: General exam: Appears comfortable  HEENT: oral mucosa moist Respiratory system: Clear to auscultation.   Mild cough- had chest congestion Cardiovascular system: S1 & S2 heard  Gastrointestinal system: Abdomen soft, non-tender, nondistended. Normal bowel sounds   Extremities: No cyanosis, clubbing or edema Psychiatry:  Mood &  affect appropriate.      CBC: Recent Labs  Lab 04/03/24 0444 04/04/24 0557  WBC 11.3* 5.7  HGB 9.8* 10.1*  HCT 31.4* 31.7*  MCV 106.4* 103.9*  PLT 114* 121*   Basic Metabolic Panel: Recent Labs  Lab 04/03/24 0444 04/05/24 0515 04/06/24 0813  NA 137 138 138  K 3.5 3.1* 3.5  CL 109 106 107  CO2 20* 22 24  GLUCOSE 109* 95 93  BUN 11 17 18   CREATININE 1.04 1.12 1.27*  CALCIUM 8.0* 8.3* 8.3*  MG  --  2.3  --      Scheduled Meds:  abiraterone  acetate  1,000 mg Oral Daily   artificial tears  1 drop Both Eyes TID   Chlorhexidine  Gluconate Cloth  6 each Topical  Daily   cyanocobalamin   1,000 mcg Oral Daily   dextromethorphan -guaiFENesin   1 tablet Oral BID   enoxaparin  (LOVENOX ) injection  40 mg Subcutaneous Q24H   feeding supplement  237 mL Oral BID BM   losartan   100 mg Oral q AM   multivitamin with minerals  1 tablet Oral Daily   polyethylene glycol  17 g Oral Daily   predniSONE   5 mg Oral Q breakfast   senna-docusate  1 tablet Oral BID   tamsulosin   0.4 mg Oral q AM    Imaging and lab data personally reviewed   Author: [employer]  04/07/2024 10:03 AM  To contact [facility]>   Check the care team in [facility] and look for the attending/consulting TRH provider listed  Log into [email or web address] and use [place]'s universal password   Go to> [facility]  and find provider  If you still have difficulty reaching the provider, please page the [facility] (Director on Call) for the Hospitalists listed on amion

## 2024-04-07 NOTE — Progress Notes (Signed)
 Patient refused all medications. Stated I dont know how much longer I can live like this Will notify MD and continue to monitor.

## 2024-04-07 NOTE — Plan of Care (Signed)
  Problem: Activity: Goal: Risk for activity intolerance will decrease Outcome: Progressing   Problem: Coping: Goal: Level of anxiety will decrease Outcome: Progressing   Problem: Elimination: Goal: Will not experience complications related to urinary retention Outcome: Progressing   Problem: Pain Managment: Goal: General experience of comfort will improve and/or be controlled Outcome: Progressing   Problem: Skin Integrity: Goal: Risk for impaired skin integrity will decrease Outcome: Progressing

## 2024-04-07 NOTE — Progress Notes (Signed)
 Safety sitter at bedside

## 2024-04-07 NOTE — Plan of Care (Signed)

## 2024-04-08 DIAGNOSIS — Z515 Encounter for palliative care: Secondary | ICD-10-CM

## 2024-04-08 DIAGNOSIS — R296 Repeated falls: Secondary | ICD-10-CM

## 2024-04-08 DIAGNOSIS — Z66 Do not resuscitate: Secondary | ICD-10-CM | POA: Diagnosis not present

## 2024-04-08 DIAGNOSIS — Z7189 Other specified counseling: Secondary | ICD-10-CM

## 2024-04-08 LAB — BASIC METABOLIC PANEL WITH GFR
Anion gap: 10 (ref 5–15)
BUN: 16 mg/dL (ref 8–23)
CO2: 23 mmol/L (ref 22–32)
Calcium: 8.4 mg/dL — ABNORMAL LOW (ref 8.9–10.3)
Chloride: 108 mmol/L (ref 98–111)
Creatinine, Ser: 1 mg/dL (ref 0.61–1.24)
GFR, Estimated: >60 mL/min (ref >60)
Glucose, Bld: 80 mg/dL (ref 70–99)
Potassium: 3.6 mmol/L (ref 3.5–5.1)
Sodium: 141 mmol/L (ref 135–145)

## 2024-04-08 NOTE — Plan of Care (Signed)
  Problem: Coping: Goal: Level of anxiety will decrease Outcome: Progressing   Problem: Pain Managment: Goal: General experience of comfort will improve and/or be controlled Outcome: Progressing   Problem: Skin Integrity: Goal: Risk for impaired skin integrity will decrease Outcome: Progressing

## 2024-04-08 NOTE — Progress Notes (Addendum)
 [facility] Progress Note  Patient: Alexander Rich     FMW:[id number]  DOA: 03/30/2024   PCP: Seabron Lenis, MD       Brief hospital course: This is a 89 year old male with history of duodenal ulcer, upper GI bleed, hypertension, prostate cancer metastatic to the bone who presented to the hospital for recurrent falls and generalized weakness.  The patient fell at home in the bathtub and could not get up for about 16 hours. In the ED: CK 894.  It rose to 998 and subsequently improved after being treated with IV fluids.  In the ED, the patient was able to walk about 50 feet without difficulty with a walker PT consulted and recommended home health.  The patient  stated that he was not comfortable with going home because he lives alone.  Patient noted to be volume overloaded on 12/2 and IV Lasix  was given.  Echo was ordered.  Subjective:  Sleepy. No complaints today  Assessment and Plan: Principal Problem:   Rhabdomyolysis/generalized weakness with prolonged - Rhabdomyolysis resolved  Active Problems:   Prostate cancer metastatic to bone (HCC) - Continue Flomax   Anxiety Insomnia -  12/5>  we discussed the indications and the side effects and he decided to start a low-dose of Xanax  and Ambien - noted to have confusion after Xanax  which had to be discontinued  Urinary retention - foley removed 12/4- voided with 1 cc post void residual  Dyspnea and elevated BNP - Chest x-ray was suggestive of pulmonary edema on 2/2-she was noted of having some shortness of breath and crackles and IV Lasix  was given - 2D echo completed and shows a normal EF and no diastolic dysfunction  Hypertension Continue losartan   Chronic thrombocytopenia  Generalized weakness   Disposition:  frail, poor oral intake, poor mobility, with metastatic prostate cancer- he no longer is able to care for himself and therefore, currently  looking for SNF - recommend a SNF with palliative care can following  closely as he has a poor prognosis      Code Status: Limited: Do not attempt resuscitation (DNR) -DNR-LIMITED -Do Not Intubate/DNI  Total time on patient care: 35 minutes DVT prophylaxis:  enoxaparin  (LOVENOX ) injection 40 mg Start: 03/30/24 2200     Objective:   Vitals:   04/07/24 1359 04/07/24 2019 04/08/24 0540 04/08/24 1154  BP: (!) 167/97 (!) 148/62 (!) 155/64 113/64  Pulse: 64 72 68 (!) 56  Resp: 16 17 17 16   Temp: (!) 97.5 F (36.4 C) 97.7 F (36.5 C) 97.7 F (36.5 C) (!) 97.5 F (36.4 C)  TempSrc:  Axillary Axillary Oral  SpO2:  97% 98% (!) 80%  Weight:      Height:       Filed Weights   03/30/24 1823  Weight: 76.8 kg   Exam: General exam: Appears comfortable  HEENT: oral mucosa moist Respiratory system: Clear to auscultation.    Cardiovascular system: S1 & S2 heard  Gastrointestinal system: Abdomen soft, non-tender, nondistended. Normal bowel sounds   Extremities: No cyanosis, clubbing or edema Psychiatry:  Mood & affect appropriate.      CBC: Recent Labs  Lab 04/03/24 0444 04/04/24 0557  WBC 11.3* 5.7  HGB 9.8* 10.1*  HCT 31.4* 31.7*  MCV 106.4* 103.9*  PLT 114* 121*   Basic Metabolic Panel: Recent Labs  Lab 04/03/24 0444 04/05/24 0515 04/06/24 0813 04/08/24 0731  NA 137 138 138 141  K 3.5 3.1* 3.5 3.6  CL 109 106 107 108  CO2  20* 22 24 23   GLUCOSE 109* 95 93 80  BUN 11 17 18 16   CREATININE 1.04 1.12 1.27* 1.00  CALCIUM 8.0* 8.3* 8.3* 8.4*  MG  --  2.3  --   --      Scheduled Meds:  abiraterone  acetate  1,000 mg Oral Daily   artificial tears  1 drop Both Eyes TID   Chlorhexidine  Gluconate Cloth  6 each Topical Daily   cyanocobalamin   1,000 mcg Oral Daily   dextromethorphan -guaiFENesin   1 tablet Oral BID   enoxaparin  (LOVENOX ) injection  40 mg Subcutaneous Q24H   feeding supplement  237 mL Oral BID BM   feeding supplement  237 mL Oral TID BM   losartan   100 mg Oral q AM   multivitamin with minerals  1 tablet Oral Daily    polyethylene glycol  17 g Oral Daily   predniSONE   5 mg Oral Q breakfast   senna-docusate  1 tablet Oral BID   tamsulosin   0.4 mg Oral q AM    Imaging and lab data personally reviewed   Author: True Atlas  04/08/2024 1:17 PM  To contact [facility]>   Check the care team in [facility] and look for the attending/consulting TRH provider listed  Log into [email or web address] and use [place]'s universal password   Go to> [facility]  and find provider  If you still have difficulty reaching the provider, please page the [facility] (Director on Call) for the Hospitalists listed on amion

## 2024-04-09 DIAGNOSIS — C7951 Secondary malignant neoplasm of bone: Secondary | ICD-10-CM | POA: Diagnosis not present

## 2024-04-09 DIAGNOSIS — C61 Malignant neoplasm of prostate: Secondary | ICD-10-CM | POA: Diagnosis not present

## 2024-04-09 DIAGNOSIS — M6282 Rhabdomyolysis: Secondary | ICD-10-CM | POA: Diagnosis not present

## 2024-04-09 NOTE — Plan of Care (Signed)
  Problem: Coping: Goal: Level of anxiety will decrease Outcome: Progressing   Problem: Pain Managment: Goal: General experience of comfort will improve and/or be controlled Outcome: Progressing   Problem: Skin Integrity: Goal: Risk for impaired skin integrity will decrease Outcome: Progressing

## 2024-04-09 NOTE — Progress Notes (Signed)
 [facility] Progress Note  Patient: Alexander Rich     FMW:[id number]  DOA: 03/30/2024   PCP: Seabron Lenis, MD       Brief hospital course: This is a 89 year old male with history of duodenal ulcer, upper GI bleed, hypertension, prostate cancer metastatic to the bone who presented to the hospital for recurrent falls and generalized weakness.  The patient fell at home in the bathtub and could not get up for about 16 hours. In the ED: CK 894.  It rose to 998 and subsequently improved after being treated with IV fluids.  In the ED, the patient was able to walk about 50 feet without difficulty with a walker PT consulted and recommended home health.  The patient  stated that he was not comfortable with going home because he lives alone.  Patient noted to be volume overloaded on 12/2 and IV Lasix  was given.  Echo was ordered.  Subjective:  Alert today. States he feels good. No complaints of anxiety. No other complaints.   Assessment and Plan: Principal Problem:   Rhabdomyolysis/generalized weakness with prolonged - Rhabdomyolysis resolved  Active Problems:   Prostate cancer metastatic to bone (HCC) - Continue Flomax   Anxiety Insomnia -  12/5>  we discussed the indications and the side effects and he decided to start a low-dose of Xanax  and Ambien - noted to have confusion after Xanax  which had to be discontinued - Ambien  is helping with insomnia and he would like it given every night  Urinary retention - foley removed 12/4- voided with 1 cc post void residual  Dyspnea and elevated BNP - Chest x-ray was suggestive of pulmonary edema on 2/2-she was noted of having some shortness of breath and crackles and IV Lasix  was given - 2D echo completed and shows a normal EF and no diastolic dysfunction  Hypertension Continue losartan   Chronic thrombocytopenia  Generalized weakness   Disposition:  frail, poor oral intake, poor mobility, with metastatic prostate cancer- he no  longer is able to care for himself and therefore, currently  looking for SNF - recommend a SNF with palliative care can following closely as he has a poor prognosis      Code Status: Limited: Do not attempt resuscitation (DNR) -DNR-LIMITED -Do Not Intubate/DNI  Total time on patient care: 35 minutes DVT prophylaxis:  enoxaparin  (LOVENOX ) injection 40 mg Start: 03/30/24 2200     Objective:   Vitals:   04/08/24 0540 04/08/24 1154 04/08/24 2023 04/09/24 0620  BP: (!) 155/64 113/64 138/63 (!) 149/86  Pulse: 68 (!) 56 66 69  Resp: 17 16 18 18   Temp: 97.7 F (36.5 C) (!) 97.5 F (36.4 C) 97.8 F (36.6 C) 97.7 F (36.5 C)  TempSrc: Axillary Oral Oral Oral  SpO2: 98% (!) 80% 96% 96%  Weight:      Height:       Filed Weights   03/30/24 1823  Weight: 76.8 kg   Exam: General exam: Appears comfortable  HEENT: oral mucosa moist Respiratory system: Clear to auscultation.    Cardiovascular system: S1 & S2 heard  Gastrointestinal system: Abdomen soft, non-tender, nondistended. Normal bowel sounds   Extremities: No cyanosis, clubbing or edema Psychiatry:  Mood & affect appropriate.      CBC: Recent Labs  Lab 04/03/24 0444 04/04/24 0557  WBC 11.3* 5.7  HGB 9.8* 10.1*  HCT 31.4* 31.7*  MCV 106.4* 103.9*  PLT 114* 121*   Basic Metabolic Panel: Recent Labs  Lab 04/03/24 0444 04/05/24 0515 04/06/24 0813  04/08/24 0731  NA 137 138 138 141  K 3.5 3.1* 3.5 3.6  CL 109 106 107 108  CO2 20* 22 24 23   GLUCOSE 109* 95 93 80  BUN 11 17 18 16   CREATININE 1.04 1.12 1.27* 1.00  CALCIUM 8.0* 8.3* 8.3* 8.4*  MG  --  2.3  --   --      Scheduled Meds:  abiraterone  acetate  1,000 mg Oral Daily   artificial tears  1 drop Both Eyes TID   Chlorhexidine  Gluconate Cloth  6 each Topical Daily   cyanocobalamin   1,000 mcg Oral Daily   dextromethorphan -guaiFENesin   1 tablet Oral BID   enoxaparin  (LOVENOX ) injection  40 mg Subcutaneous Q24H   feeding supplement  237 mL Oral BID BM    feeding supplement  237 mL Oral TID BM   losartan   100 mg Oral q AM   multivitamin with minerals  1 tablet Oral Daily   polyethylene glycol  17 g Oral Daily   predniSONE   5 mg Oral Q breakfast   senna-docusate  1 tablet Oral BID   tamsulosin   0.4 mg Oral q AM    Imaging and lab data personally reviewed   Author: Jaelynn Currier  04/09/2024 11:01 AM  To contact [facility]>   Check the care team in [employer] and look for the attending/consulting TR[facility] provider listed  Log into [email or web address] and use [place]'s universal password   Go to> [facility]  and find provider  If you still have difficulty reaching the provider, please page the [facility] (Director on Call) for the Hospitalists listed on amion

## 2024-04-10 ENCOUNTER — Other Ambulatory Visit: Payer: Self-pay

## 2024-04-10 MED ORDER — ORAL CARE MOUTH RINSE: 15.0000 mL | OROMUCOSAL | Status: DC | PRN

## 2024-04-10 NOTE — Progress Notes (Signed)
 Physical Therapy Treatment Patient Details Name: Alexander Rich MRN: [id number] DOB: [birth year] Today's Date: 04/10/2024   History of Present Illness 89 yo male admitted to hospital on 03/30/2024 due to 2 falls secondary to progressive weakness, pt found to have rhabdomyolysis. 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 Patient noted to be volume overloaded on 12/2 and IV Lasix  was given.    PT Comments   Pt admitted with above diagnosis.  Pt currently with functional limitations due to the deficits listed below (see PT Problem List). Pt in bed when therapist arrived. Pt agreeable to therapy session. Pt reports LBP and some bad news with dx of cancer to bone and now [facility]. Pt continues to request SNF vs home with [facility]. Pt required min A for supine to sit with use of hospital bed features, CGA for sit to stand  from EOB with pt pulling on RW, gait tasks in hallway 120 feet with RW, CGA, cues for safety and RW management with pt becoming fatigued at end of amb bout with increased trunk flexion and pt requesting to sit. Pt left seated in recliner, all needs in place, pt c/o SOB at beginning of session and 96-97% on RA throughout. Pt will benefit from acute skilled PT to increase their independence and safety with mobility to allow discharge.      If plan is discharge home, recommend the following: A little help with walking and/or transfers;A little help with bathing/dressing/bathroom;Assistance with cooking/housework;Assist for transportation;Help with stairs or ramp for entrance   Can travel by private vehicle     Yes  Equipment Recommendations  None recommended by PT    Recommendations for Other Services       Precautions / Restrictions Precautions Precautions: Fall Recall of Precautions/Restrictions: Intact Restrictions Weight Bearing  Restrictions Per Provider Order: No     Mobility  Bed Mobility Overal bed mobility: Needs Assistance Bed Mobility: Supine to Sit     Supine to sit: Min assist, HOB elevated     General bed mobility comments: min cues, increased time    Transfers Overall transfer level: Needs assistance Equipment used: Rolling walker (2 wheels) Transfers: Sit to/from Stand Sit to Stand: From elevated surface, Contact guard assist           General transfer comment: Cues for safety, hand placement    Ambulation/Gait Ambulation/Gait assistance: Contact guard assist Gait Distance (Feet): 120 Feet Assistive device: Rolling walker (2 wheels) Gait Pattern/deviations: Decreased step length - right, Decreased step length - left, Step-through pattern, Trunk flexed Gait velocity: decreased     General Gait Details: slight trunk flexion. cues for safety, posture and RW positioning to maintain body inside RW espeically with turns, pt increased trunk flexion with fatigue .   Stairs             Wheelchair Mobility     Tilt Bed    Modified Rankin (Stroke Patients Only)       Balance Overall balance assessment: Needs assistance Sitting-balance support: Feet supported, Single extremity supported, No upper extremity supported Sitting balance-Leahy Scale: Good     Standing balance support: Bilateral upper extremity supported, During functional activity, Reliant on assistive device for balance Standing balance-Leahy Scale: Poor Standing balance comment: B UE support at [facility]  Communication Communication Communication: No apparent difficulties  Cognition Arousal: Alert Behavior During Therapy: WFL for tasks assessed/performed   PT - Cognitive impairments: No apparent impairments                         Following commands: Intact      Cueing Cueing Techniques: Verbal cues  Exercises      General Comments General comments (skin  integrity, edema, etc.): pt reported SOB at rest in bed and 96% on RA, s/p gait97% on RA and no reports of SOB      Pertinent Vitals/Pain Pain Assessment Pain Assessment: 0-10 Pain Score: 5  Pain Location: back Pain Descriptors / Indicators: Aching, Constant, Discomfort Pain Intervention(s): Limited activity within patient's tolerance, Monitored during session, Repositioned    Home Living                          Prior Function            PT Goals (current goals can now be found in the care plan section) Acute Rehab PT Goals Patient Stated Goal: to go to a home and get some help PT Goal Formulation: With patient Time For Goal Achievement: 04/14/24 Potential to Achieve Goals: Fair Progress towards PT goals: Progressing toward goals    Frequency    Min 2X/week      PT Plan      Co-evaluation              AM-PAC PT 6 Clicks Mobility   Outcome Measure  Help needed turning from your back to your side while in a flat bed without using bedrails?: A Little Help needed moving from lying on your back to sitting on the side of a flat bed without using bedrails?: A Little Help needed moving to and from a bed to a chair (including a wheelchair)?: A Little Help needed standing up from a chair using your arms (e.g., wheelchair or bedside chair)?: A Little Help needed to walk in hospital room?: A Lot Help needed climbing 3-5 steps with a railing? : A Lot 6 Click Score: 16    End of Session Equipment Utilized During Treatment: Gait belt Activity Tolerance: Patient limited by fatigue Patient left: with call bell/phone within reach;in chair;with chair alarm set Nurse Communication: Mobility status PT Visit Diagnosis: Unsteadiness on feet (R26.81);Other abnormalities of gait and mobility (R26.89);Repeated falls (R29.6);Muscle weakness (generalized) (M62.81);Difficulty in walking, not elsewhere classified (R26.2)     Time: 8864-8840 PT Time Calculation (min)  (ACUTE ONLY): 24 min  Charges:    $Gait Training: 8-22 mins $Therapeutic Activity: 8-22 mins PT General Charges $$ ACUTE PT VISIT: 1 Visit                     [place], PT Acute Rehab    [place] VEAR Drone 04/10/2024, 12:09 PM

## 2024-04-10 NOTE — TOC Progression Note (Addendum)
 Transition of Care [facility]) - Progression Note    Patient Details  Name: Alexander Rich MRN: [id number] Date of Birth: July 26, 1925  Transition [facility] CM/SW Contact  Heather DELENA Saltness, LCSW Phone Number: 04/10/2024, 12:37 PM  Clinical Narrative:     ADDENDUM  Peer-to-peer requested by 12/9 at 11:30 AM. MD to call [phone], option 5. Attending MD notified. TOC will continue to follow.  Updated clinical documentation uploaded to [facility] for insurance authorization. Authorization currently pending. TOC will continue to follow.   Expected Discharge Plan: Skilled Nursing Facility Barriers to Discharge: Continued Medical Work up   Expected Discharge Plan and Services In-house Referral: NA Discharge Planning Services: CM Consult Post Acute Care Choice: Skilled Nursing Facility Living arrangements for the past 2 months: Single Family Home                 DME Arranged: N/A DME Agency: NA       HH Arranged: NA HH Agency: NA         Social Drivers of Health (SDOH) Interventions SDOH Screenings   Food Insecurity: No Food Insecurity (03/30/2024)  Housing: Low Risk  (03/30/2024)  Transportation Needs: No Transportation Needs (03/30/2024)  Utilities: Not At Risk (03/30/2024)  Depression (PHQ2-9): Low Risk  (01/05/2024)  Social Connections: Moderately Integrated (03/30/2024)  Tobacco Use: Low Risk  (03/30/2024)    Readmission Risk Interventions    11/04/2022    1:30 PM  Readmission Risk Prevention Plan  Transportation Screening Complete  PCP or Specialist Appt within 3-5 Days Complete  HRI or Home Care Consult Complete  Social Work Consult for Recovery Care Planning/Counseling Complete  Palliative Care Screening Not Applicable  Medication Review Oceanographer) Complete    Signed: Heather Saltness, MSW, LCSW Clinical Social Worker Inpatient Care Management 04/10/2024 12:38 PM

## 2024-04-10 NOTE — Progress Notes (Signed)
 [facility] Progress Note  Patient: Alexander Rich     FMW:[id number]  DOA: 03/30/2024   PCP: Seabron Lenis, MD       Brief hospital course: This is a 89 year old male with history of duodenal ulcer, upper GI bleed, hypertension, prostate cancer metastatic to the bone who presented to the hospital for recurrent falls and generalized weakness.  The patient fell at home in the bathtub and could not get up for about 16 hours. In the ED: CK 894.  It rose to 998 and subsequently improved after being treated with IV fluids.  In the ED, the patient was able to walk about 50 feet without difficulty with a walker PT consulted and recommended home health.  The patient  stated that he was not comfortable with going home because he lives alone.  Patient noted to be volume overloaded on 12/2 and IV Lasix  was given.  Echo was ordered.  Subjective:  He has no complaints today.   Assessment and Plan: Principal Problem:   Rhabdomyolysis/generalized weakness with prolonged - Rhabdomyolysis resolved  Active Problems:   Prostate cancer metastatic to bone (HCC) - cont Zytiga  and Prednisone  - Has mets to L spine- cont Oxycodone  as needed for his back pain   Anxiety Insomnia -  12/5>  we discussed the indications and the side effects and he decided to start a low-dose of Xanax  and Ambien - noted to have confusion after Xanax  which had to be discontinued - Ambien  is helping with insomnia and he would like it given every night  Urinary retention - Continue Flomax  - foley removed 12/4- voided with 1 cc post void residual  Dyspnea and elevated BNP - Chest x-ray was suggestive of pulmonary edema on 2/2-she was noted of having some shortness of breath and crackles and IV Lasix  was given - 2D echo completed and shows a normal EF and no diastolic dysfunction  Hypertension Continue losartan   Chronic thrombocytopenia  Generalized weakness   Disposition:  frail, poor oral intake, poor mobility,  with metastatic prostate cancer- he no longer is able to care for himself and therefore, currently  looking for SNF - recommend a SNF with palliative care can following closely as he has a poor prognosis      Code Status: Limited: Do not attempt resuscitation (DNR) -DNR-LIMITED -Do Not Intubate/DNI  Total time on patient care: 35 minutes DVT prophylaxis:  enoxaparin  (LOVENOX ) injection 40 mg Start: 03/30/24 2200     Objective:   Vitals:   04/09/24 0620 04/09/24 1458 04/09/24 2005 04/10/24 0602  BP: (!) 149/86 (!) 142/61 (!) 143/69 (!) 149/82  Pulse: 69 67 66 69  Resp: 18 17 18 18   Temp: 97.7 F (36.5 C) (!) 97.5 F (36.4 C) 97.8 F (36.6 C) 98.1 F (36.7 C)  TempSrc: Oral Oral Oral Oral  SpO2: 96% 99% 97% 100%  Weight:      Height:       Filed Weights   03/30/24 1823  Weight: 76.8 kg   Exam: General exam: Appears comfortable  HEENT: oral mucosa moist Respiratory system: Clear to auscultation.    Cardiovascular system: S1 & S2 heard  Gastrointestinal system: Abdomen soft, non-tender, nondistended. Normal bowel sounds   Extremities: No cyanosis, clubbing or edema Psychiatry:  Mood & affect appropriate.      CBC: Recent Labs  Lab 04/04/24 0557  WBC 5.7  HGB 10.1*  HCT 31.7*  MCV 103.9*  PLT 121*   Basic Metabolic Panel: Recent Labs  Lab 04/05/24  0515 04/06/24 0813 04/08/24 0731  NA 138 138 141  K 3.1* 3.5 3.6  CL 106 107 108  CO2 22 24 23   GLUCOSE 95 93 80  BUN 17 18 16   CREATININE 1.12 1.27* 1.00  CALCIUM 8.3* 8.3* 8.4*  MG 2.3  --   --      Scheduled Meds:  abiraterone  acetate  1,000 mg Oral Daily   artificial tears  1 drop Both Eyes TID   Chlorhexidine  Gluconate Cloth  6 each Topical Daily   cyanocobalamin   1,000 mcg Oral Daily   dextromethorphan -guaiFENesin   1 tablet Oral BID   enoxaparin  (LOVENOX ) injection  40 mg Subcutaneous Q24H   feeding supplement  237 mL Oral BID BM   feeding supplement  237 mL Oral TID BM   losartan   100 mg Oral q  AM   multivitamin with minerals  1 tablet Oral Daily   polyethylene glycol  17 g Oral Daily   predniSONE   5 mg Oral Q breakfast   senna-docusate  1 tablet Oral BID   tamsulosin   0.4 mg Oral q AM    Imaging and lab data personally reviewed   Author: Shanyiah Conde  04/10/2024 11:41 AM  To contact [facility]>   Check the care team in [facility] and look for the attending/consulting TR[facility] provider listed  Log into [email or web address] and use [place]'s universal password   Go to> [facility]  and find provider  If you still have difficulty reaching the provider, please page the [facility] (Director on Call) for the Hospitalists listed on amion

## 2024-04-10 NOTE — Discharge Summary (Signed)
 Physician Discharge Summary  Alexander Rich FMW:[id number] DOB: [birth year] DOA: 03/30/2024  PCP: Seabron Lenis, MD  Admit date: 03/30/2024 Discharge date: 04/11/2024 Discharging to: SNF     Discharge Diagnoses:   Principal Problem:   Rhabdomyolysis Active Problems:   Prostate cancer metastatic to bone [facility])   Essential hypertension   Thrombocytopenia   Sinus bradycardia   Generalized weakness   B12 deficiency   Macrocytosis   Mild protein malnutrition   Falls   Palliative care encounter   Goals of care, counseling/discussion   Counseling and coordination of care   DNR (do not resuscitate)   Weakness        Brief hospital course: This is a 89 year old male with history of duodenal ulcer, upper GI bleed, hypertension, prostate cancer metastatic to the bone who presented to the hospital for recurrent falls and generalized weakness.  The patient fell at home in the bathtub and could not get up for about 16 hours. In the ED: CK 894.  It rose to 998 and subsequently improved after being treated with IV fluids.  In the ED, the patient was able to walk about 50 feet without difficulty with a walker PT consulted and recommended home health.  The patient  stated that he was not comfortable with going home because he lives alone.  Patient noted to be volume overloaded on 12/2 and IV Lasix  was given.  Echo was ordered.   Subjective:  Alert today. States he feels good. No complaints of anxiety. No other complaints.    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 -cont Zytiga  and Prednisone    Anxiety Insomnia -  12/5>  we discussed the indications and the side effects and he decided to start a low-dose of Xanax  and Ambien - noted to have confusion after Xanax  which had to be discontinued -  Ambien  is helping with insomnia and he would like it given every night   Urinary retention, BPH - Continue Flomax  - foley removed 12/4- voided with 1 cc post void residual  Chronic bronchiectasis -in RLL noted on imaging   Dyspnea and elevated BNP - Chest x-ray was suggestive of pulmonary edema on 2/2-she was noted of having some shortness of breath and crackles and IV Lasix  was given - 2D echo completed and shows a normal EF and no diastolic dysfunction   Hypertension - Continue losartan    Chronic thrombocytopenia   Generalized weakness       Discharge Instructions   Allergies as of 04/11/2024       Reactions   Ibuprofen Other (See Comments)   GI bleed   Nebivolol Hcl Other (See Comments)   Felt blah; low energy   Pravastatin Other (See Comments)   Lethargy   Sertraline Hcl Other (See Comments)   Terrible feeling   Simvastatin Other (See Comments)   Lethargy   Trazodone Other (See Comments)   Foggy-headed        Medication List     STOP taking these medications    furosemide  20 MG tablet Commonly known as: LASIX        TAKE these medications    abiraterone  acetate 250 MG tablet Commonly known as: ZYTIGA  Take 4 tablets (1,000 mg total) by mouth daily on an empty stomach 1 hour before meals OR 2 hours after a meal  acetaminophen  325 MG tablet Commonly known as: TYLENOL  Take 1 tablet (325 mg total) by mouth every 6 (six) hours as needed for mild pain (or Fever >/= 101).   cyanocobalamin  1000 MCG tablet Take 1 tablet (1,000 mcg total) by mouth daily.   losartan  100 MG tablet Commonly known as: COZAAR  Take 1 tablet (100 mg total) by mouth in the morning.   Lubriderm Advanced Therapy Lotn Apply 1 application  topically 3 (three) times daily as needed (for skin dryness - arms and chest).   multivitamin with minerals Tabs tablet Take 1 tablet by mouth daily.   oxyCODONE  5 MG immediate release tablet Commonly known as: Oxy  IR/ROXICODONE  Take 1-2 tablets (5-10 mg total) by mouth every 6 (six) hours as needed for severe pain (pain score 7-10). What changed:  when to take this additional instructions   polyethylene glycol 17 g packet Commonly known as: MIRALAX  / GLYCOLAX  Take 17 g by mouth daily as needed. Hold for diarrhea. Take when taking percocet   predniSONE  5 MG tablet Commonly known as: DELTASONE  Take 1 tablet (5 mg total) by mouth daily with breakfast.   senna-docusate 8.6-50 MG tablet Commonly known as: Senokot-S Take 1 tablet by mouth 2 (two) times daily.   Systane Complete PF 0.6 % Soln Generic drug: Propylene Glycol (PF) Place 1 drop into both eyes in the morning, at noon, in the evening, and at bedtime.   tamsulosin  0.4 MG Caps capsule Commonly known as: FLOMAX  Take 0.4 mg by mouth in the morning.   Vitamin B Complex Tabs Take 1 tablet by mouth daily.   vitamin C 1000 MG tablet Take 1,000 mg by mouth daily.   Vitamin D3 1000 units Caps Take 1,000 Units by mouth daily.   zolpidem  5 MG tablet Commonly known as: AMBIEN  Take 1 tablet (5 mg total) by mouth at bedtime as needed for sleep.        Contact information for follow-up providers     Seabron Lenis, MD.   Specialty: Family Medicine Contact information: [address] [place] [address] [phone]              Contact information for after-discharge care     Destination     [facility] .   Service: Skilled Nursing Contact information: [address] [place]  [address] [phone]                        The results of significant diagnostics from this hospitalization (including imaging, microbiology, ancillary and laboratory) are listed below for reference.    ECHOCARDIOGRAM COMPLETE Result Date: 04/05/2024    ECHOCARDIOGRAM REPORT   Patient Name:   Alexander Rich Date of Exam: 04/05/2024 Medical Rec #:  [id number]       Height:       67.0 in Accession #:     [id number]      Weight:       169.3 lb Date of Birth:  May 27, 1925      BSA:          1.884 m Patient Age:    89 years        BP:           128/65 mmHg Patient Gender: M               HR:           72 bpm. Exam Location:  Inpatient Procedure: 2D Echo (  Both Spectral and Color Flow Doppler were utilized during            procedure). Indications:    CHF  History:        Patient has no prior history of Echocardiogram examinations.  Sonographer:    Charmaine Gaskins Referring Phys: [id number] AMRIT ADHIKARI  Sonographer Comments: Technically challenging study due to limited acoustic windows, Technically difficult study due to poor echo windows and suboptimal subcostal window. IMPRESSIONS  1. Left ventricular ejection fraction, by estimation, is 60 to 65%. Left ventricular ejection fraction by 2D MOD biplane is 62.3 %. The left ventricle has normal function. The left ventricle has no regional wall motion abnormalities. Left ventricular diastolic parameters were normal.  2. Right ventricular systolic function is normal. The right ventricular size is normal.  3. The mitral valve is normal in structure. Mild mitral valve regurgitation. No evidence of mitral stenosis.  4. The aortic valve is normal in structure. Aortic valve regurgitation is mild. Aortic valve sclerosis/calcification is present, without any evidence of aortic stenosis.  5. There is mild dilatation of the ascending aorta, measuring 42 mm.  6. The inferior vena cava is normal in size with greater than 50% respiratory variability, suggesting right atrial pressure of 3 mmHg. Comparison(s): No prior Echocardiogram. FINDINGS  Left Ventricle: Left ventricular ejection fraction, by estimation, is 60 to 65%. Left ventricular ejection fraction by 2D MOD biplane is 62.3 %. The left ventricle has normal function. The left ventricle has no regional wall motion abnormalities. The left ventricular internal cavity size was normal in size. There is no left ventricular hypertrophy.  Left ventricular diastolic parameters were normal. Right Ventricle: The right ventricular size is normal. No increase in right ventricular wall thickness. Right ventricular systolic function is normal. Left Atrium: Left atrial size was normal in size. Right Atrium: Right atrial size was normal in size. Pericardium: There is no evidence of pericardial effusion. Mitral Valve: The mitral valve is normal in structure. Mild mitral valve regurgitation. No evidence of mitral valve stenosis. MV peak gradient, 4.6 mmHg. The mean mitral valve gradient is 1.0 mmHg. Tricuspid Valve: The tricuspid valve is normal in structure. Tricuspid valve regurgitation is not demonstrated. No evidence of tricuspid stenosis. Aortic Valve: The aortic valve is normal in structure. Aortic valve regurgitation is mild. Aortic regurgitation PHT measures 500 msec. Aortic valve sclerosis/calcification is present, without any evidence of aortic stenosis. Aortic valve mean gradient measures 10.0 mmHg. Aortic valve peak gradient measures 22.5 mmHg. Aortic valve area, by VTI measures 2.09 cm. Pulmonic Valve: The pulmonic valve was normal in structure. Pulmonic valve regurgitation is mild. No evidence of pulmonic stenosis. Aorta: The aortic root is normal in size and structure. There is mild dilatation of the ascending aorta, measuring 42 mm. Venous: The inferior vena cava is normal in size with greater than 50% respiratory variability, suggesting right atrial pressure of 3 mmHg. IAS/Shunts: No atrial level shunt detected by color flow Doppler.  LEFT VENTRICLE PLAX 2D                        Biplane EF (MOD) LVIDd:         4.30 cm         LV Biplane EF:   Left LVIDs:         2.40 cm                          ventricular  LV PW:         0.90 cm                          ejection LV IVS:        0.80 cm                          fraction by LVOT diam:     2.30 cm                          2D MOD LV SV:         88                               biplane is LV SV  Index:   47                               62.3 %. LVOT Area:     4.15 cm                                Diastology                                LV e' medial:    7.62 cm/s LV Volumes (MOD)               LV E/e' medial:  10.1 LV vol d, MOD    59.3 ml       LV e' lateral:   11.50 cm/s A2C:                           LV E/e' lateral: 6.7 LV vol d, MOD    61.4 ml A4C: LV vol s, MOD    26.6 ml A2C: LV vol s, MOD    19.1 ml A4C: LV SV MOD A2C:   32.7 ml LV SV MOD A4C:   61.4 ml LV SV MOD BP:    37.9 ml RIGHT VENTRICLE RV Basal diam:  3.10 cm RV Mid diam:    3.10 cm LEFT ATRIUM             Index        RIGHT ATRIUM           Index LA diam:        3.50 cm 1.86 cm/m   RA Area:     12.60 cm LA Vol (A2C):   43.4 ml 23.04 ml/m  RA Volume:   28.20 ml  14.97 ml/m LA Vol (A4C):   61.4 ml 32.59 ml/m LA Biplane Vol: 54.3 ml 28.82 ml/m  AORTIC VALVE                     PULMONIC VALVE AV Area (Vmax):    1.91 cm      PR End Diast Vel: 4.93 msec AV Area (Vmean):   2.21 cm AV Area (VTI):     2.09 cm AV Vmax:           237.00 cm/s AV Vmean:          147.000 cm/s AV VTI:  0.420 m AV Peak Grad:      22.5 mmHg AV Mean Grad:      10.0 mmHg LVOT Vmax:         109.00 cm/s LVOT Vmean:        78.100 cm/s LVOT VTI:          0.211 m LVOT/AV VTI ratio: 0.50 AI PHT:            500 msec  AORTA Ao Root diam: 3.90 cm Ao Asc diam:  4.20 cm MITRAL VALVE                TRICUSPID VALVE MV Area (PHT): 3.10 cm     TR Peak grad:   26.8 mmHg MV Area VTI:   2.88 cm     TR Vmax:        259.00 cm/s MV Peak grad:  4.6 mmHg MV Mean grad:  1.0 mmHg     SHUNTS MV Vmax:       1.07 m/s     Systemic VTI:  0.21 m MV Vmean:      51.9 cm/s    Systemic Diam: 2.30 cm MV E velocity: 77.10 cm/s MV A velocity: 107.00 cm/s MV E/A ratio:  0.72 Franck Azobou Tonleu Electronically signed by Joelle Cedars Tonleu Signature Date/Time: 04/05/2024/5:46:35 PM    Final    DG CHEST PORT 1 VIEW Result Date: 04/04/2024 CLINICAL DATA:  Short of breath EXAM: PORTABLE  CHEST 1 VIEW COMPARISON:  Prior chest x-ray 11/02/2022 FINDINGS: Increased pulmonary vascular congestion with interstitial prominence and bibasilar atelectasis. Findings suggest mild interstitial edema. Stable mild cardiomegaly. Atherosclerotic calcifications are present in the transverse aorta. Probable trace bilateral pleural effusions. No pneumothorax. No acute osseous abnormality. IMPRESSION: 1. Findings are most consistent with mild CHF. Stable cardiomegaly with increased pulmonary vascular congestion, mild interstitial edema and likely small bilateral pleural effusions. Electronically Signed   By: Wilkie Lent M.D.   On: 04/04/2024 09:59   CT CHEST ABDOMEN PELVIS W CONTRAST Result Date: 03/30/2024 CLINICAL DATA:  Repeated falls. EXAM: CT CHEST, ABDOMEN, AND PELVIS WITH CONTRAST TECHNIQUE: Multidetector CT imaging of the chest, abdomen and pelvis was performed following the standard protocol during bolus administration of intravenous contrast. RADIATION DOSE REDUCTION: This exam was performed according to the departmental dose-optimization program which includes automated exposure control, adjustment of the mA and/or kV according to patient size and/or use of iterative reconstruction technique. CONTRAST:  OMNIPAQUE  IOHEXOL  300 MG/ML  SOLN COMPARISON:  07/17/2022. FINDINGS: CT CHEST FINDINGS Cardiovascular: Heart normal in size. Three-vessel coronary artery calcifications. No pericardial effusion. Great vessels normal in caliber. Aortic atherosclerosis. Mediastinum/Nodes: No mediastinal hematoma. No neck base, mediastinal or hilar masses. No enlarged lymph nodes. Trachea esophagus are unremarkable. Lungs/Pleura: Trace pleural effusions. Bronchiectasis with associated Peri broncho vascular opacities in the right lower lobe. Linear type opacity noted at the base of the left lower lobe. Mild peripheral areas of interstitial thickening bilaterally. No lung mass or suspicious nodule. Scattered tiny  stable nodules, benign. No pneumothorax. Musculoskeletal: No acute fracture. Sclerotic changes of the T1 vertebra, stable from the prior CT. No chest wall mass. CT ABDOMEN PELVIS FINDINGS Hepatobiliary: Liver normal in size and overall attenuation. Multiple liver cysts, largest measuring 7.8 cm, stable from the prior CT. Gallbladder is distended. Tiny stone in the dependent gallbladder. No wall thickening or inflammation. No bile duct dilation. Pancreas: Partial fatty replacement of the pancreas. No pancreatic mass or inflammation. Spleen: Normal in size without focal abnormality.  Adrenals/Urinary Tract: Normal adrenal glands. Kidneys normal in overall size, orientation and position with symmetric enhancement. Several renal cortical cysts, largest exophytic from the lateral lower pole the right kidney, 2.8 cm. No follow-up recommended. No stones. No hydronephrosis. Normal ureters. Normal bladder. Stomach/Bowel: Moderate hiatal hernia. Stomach otherwise unremarkable. Small bowel and colon are normal in caliber. No wall thickening. No evidence of a bowel injury or mesenteric hematoma. No inflammation. Scattered diverticula mostly along the left colon. No diverticulitis. Vascular/Lymphatic: No vascular injury. Aortic atherosclerosis. No aneurysm. No enlarged lymph nodes. Reproductive: Enlarged prostate, 5.8 x 4.8 cm transversely, stable. Other: Small fat containing inguinal hernias, right greater than left, stable. No ascites. Musculoskeletal: No acute fracture. Depression of the upper endplate of L3 by an apparent prominent Schmorl's node. There is sclerosis of the posterior elements, most evident involving the pedicle and facet on the left of L3. There are additional small sclerotic lesions. Sclerotic changes at L3 have progressed since the prior CT. IMPRESSION: 1. No acute injury to the chest, abdomen or pelvis. 2. Chronic bronchiectasis in the right lower lobe with peribronchovascular opacities consistent with  atelectasis/scarring. Consider a component of acute bronchitis if there are consistent clinical findings. Trace bilateral pleural effusions. 3. Areas of skeletal sclerosis, most evident in the lumbar spine, consistent with blastic prostate carcinoma metastases. Sclerotic changes have progressed in the lumbar spine since the prior exam. 4. No current enlarged lymph nodes. Bulky adenopathy noted on the prior abdomen pelvis CT is no longer visualized. 5. Chronic findings include aortic atherosclerosis, moderate hiatal hernia, liver cysts and colonic diverticula. 6. Stable prostate enlargement. Electronically Signed   By: Alm Parkins M.D.   On: 03/30/2024 15:11   CT Cervical Spine Wo Contrast Result Date: 03/30/2024 CLINICAL DATA:  Multiple falls, history of metastatic prostate cancer EXAM: CT CERVICAL SPINE WITHOUT CONTRAST TECHNIQUE: Multidetector CT imaging of the cervical spine was performed without intravenous contrast. Multiplanar CT image reconstructions were also generated. RADIATION DOSE REDUCTION: This exam was performed according to the departmental dose-optimization program which includes automated exposure control, adjustment of the mA and/or kV according to patient size and/or use of iterative reconstruction technique. COMPARISON:  11/02/2022 FINDINGS: Alignment: Alignment is anatomic. Skull base and vertebrae: No acute displaced fracture. Chronic sclerosis of the T1 vertebral body compatible with known metastatic prostate cancer. No other destructive bony abnormalities. Soft tissues and spinal canal: No prevertebral fluid or swelling. No visible canal hematoma. Disc levels: Multilevel facet hypertrophic changes with bony fusion across the left C4-5 facet joint. There is partial bony fusion across the C4-5 disc space as well. Moderate spondylosis at C3-4. Upper chest: Airway is patent.  Lung apices are clear. Other: Reconstructed images demonstrate no additional findings. IMPRESSION: 1. No acute  cervical spine fracture. 2. Stable multilevel cervical degenerative changes. 3. Stable sclerotic metastasis at the T1 vertebral body. Electronically Signed   By: Ozell Daring M.D.   On: 03/30/2024 15:02   CT Head Wo Contrast Result Date: 03/30/2024 CLINICAL DATA:  Multiple falls, head trauma EXAM: CT HEAD WITHOUT CONTRAST TECHNIQUE: Contiguous axial images were obtained from the base of the skull through the vertex without intravenous contrast. RADIATION DOSE REDUCTION: This exam was performed according to the departmental dose-optimization program which includes automated exposure control, adjustment of the mA and/or kV according to patient size and/or use of iterative reconstruction technique. COMPARISON:  11/02/2022 FINDINGS: Brain: Chronic small vessel ischemic changes are seen within the periventricular white matter and bilateral thalami. No acute infarct or hemorrhage.  Lateral ventricles and midline structures are unremarkable. No acute extra-axial fluid collections. No mass effect. Vascular: No hyperdense vessel or unexpected calcification. Skull: Normal. Negative for fracture or focal lesion. Sinuses/Orbits: No acute finding. Other: None. IMPRESSION: 1. No acute intracranial process. Electronically Signed   By: Ozell Daring M.D.   On: 03/30/2024 14:58   DG Shoulder Right Result Date: 03/30/2024 CLINICAL DATA:  Clemens yesterday afternoon, found this morning. Right shoulder pain. EXAM: RIGHT SHOULDER - 2+ VIEW COMPARISON:  None Available. FINDINGS: No fracture.  No bone lesion. Glenohumeral and AC joints are normally aligned. Mild AC joint osteoarthritis. Narrowed subacromial space suggests a chronic rotator cuff tear. Skeletal structures are demineralized. IMPRESSION: 1. No fracture or dislocation. Electronically Signed   By: Alm Parkins M.D.   On: 03/30/2024 14:39   Labs:   Basic Metabolic Panel: Recent Labs  Lab 04/05/24 0515 04/06/24 0813 04/08/24 0731  NA 138 138 141  K 3.1* 3.5 3.6   CL 106 107 108  CO2 22 24 23   GLUCOSE 95 93 80  BUN 17 18 16   CREATININE 1.12 1.27* 1.00  CALCIUM 8.3* 8.3* 8.4*  MG 2.3  --   --      CBC: No results for input(s): WBC, NEUTROABS, HGB, HCT, MCV, PLT in the last 168 hours.        SIGNED:   Florinda Taflinger, MD  [facility] 04/11/2024, 9:40 AM Time taking on discharge: 50 minutes

## 2024-04-10 NOTE — Progress Notes (Signed)
 Nutrition Follow-up  INTERVENTION:   -Ensure Plus High Protein po TID, each supplement provides 350 kcal and 20 grams of protein.    -Multivitamin with minerals daily   -Needs feeding or meal set up assistance  NUTRITION DIAGNOSIS:   Inadequate oral intake related to poor appetite as evidenced by per patient/family report.  Ongoing.  GOAL:   Patient will meet greater than or equal to 90% of their needs  Progressing.  MONITOR:   PO intake, Supplement acceptance   ASSESSMENT:   89 year old male with a history of upper GI bleed, duodenal ulcer, hypertension, metastatic prostate cancer to bone, recurrent falls who presented with a fall from home,weakness.  Patient  currently consuming 25-90% of meals. Accepting Ensure at least once daily. Accepting MVI.  Admission weight: 169 lbs No other weights this admission.  Medications: Vitamin B-12, Multivitamin with minerals daily, Miralax , Prednisone , Senokot  Labs reviewed.  Diet Order:   Diet Order             Diet Heart Room service appropriate? Yes; Fluid consistency: Thin  Diet effective now                   EDUCATION NEEDS:   Not appropriate for education at this time  Skin:  Skin Assessment: Skin Integrity Issues: Skin Integrity Issues:: Other (Comment) Other: abrasions on arms, right wrist  Last BM:  12/7 -type 6  Height:   Ht Readings from Last 1 Encounters:  03/30/24 5' 7 (1.702 m)    Weight:   Wt Readings from Last 1 Encounters:  03/30/24 76.8 kg    BMI:  Body mass index is 26.52 kg/m.  Estimated Nutritional Needs:   Kcal:  1600-1800  Protein:  75-85g  Fluid:  1.6L/day   Morna Lee, MS, RD, LDN Inpatient Clinical Dietitian Contact via Secure chat

## 2024-04-10 NOTE — TOC Progression Note (Signed)
 Transition of Care [facility]) - Progression Note    Patient Details  Name: Alexander Rich MRN: [id number] Date of Birth: Jul 21, 1925  Transition of Care [facility]) CM/SW Contact  Heather DELENA Saltness, LCSW Phone Number: 04/10/2024, 9:36 AM  Clinical Narrative:    Pt's insurance authorization for [facility] is still pending. TOC will continue to follow.   Expected Discharge Plan: Skilled Nursing Facility Barriers to Discharge: Continued Medical Work up  Expected Discharge Plan and Services In-house Referral: NA Discharge Planning Services: CM Consult Post Acute Care Choice: Skilled Nursing Facility Living arrangements for the past 2 months: Single Family Home                 DME Arranged: N/A DME Agency: NA       HH Arranged: NA HH Agency: NA         Social Drivers of Health (SDOH) Interventions SDOH Screenings   Food Insecurity: No Food Insecurity (03/30/2024)  Housing: Low Risk  (03/30/2024)  Transportation Needs: No Transportation Needs (03/30/2024)  Utilities: Not At Risk (03/30/2024)  Depression (PHQ2-9): Low Risk  (01/05/2024)  Social Connections: Moderately Integrated (03/30/2024)  Tobacco Use: Low Risk  (03/30/2024)    Readmission Risk Interventions    11/04/2022    1:30 PM  Readmission Risk Prevention Plan  Transportation Screening Complete  PCP or Specialist Appt within 3-5 Days Complete  HRI or Home Care Consult Complete  Social Work Consult for Recovery Care Planning/Counseling Complete  Palliative Care Screening Not Applicable  Medication Review Oceanographer) Complete    Signed: Heather Saltness, MSW, LCSW Clinical Social Worker Inpatient Care Management 04/10/2024 9:36 AM

## 2024-04-10 NOTE — TOC Progression Note (Signed)
 Transition of Care [facility]) - Progression Note    Patient Details  Name: Alexander Rich MRN: [id number] Date of Birth: 16-Jun-1925  Transition of Care [facility]) CM/SW Contact  Doneta Glenys DASEN, RN Phone Number: 04/10/2024, 11:35 AM  Clinical Narrative:    CM received call for [employer] requesting us  to upload PT/OT note for the last 48 hours of patient out of bed activity. Information forwarded to [facility] via chat.   Expected Discharge Plan: Skilled Nursing Facility Barriers to Discharge: Continued Medical Work up               Expected Discharge Plan and Services In-house Referral: NA Discharge Planning Services: CM Consult Post Acute Care Choice: Skilled Nursing Facility Living arrangements for the past 2 months: Single Family Home                 DME Arranged: N/A DME Agency: NA       HH Arranged: NA HH Agency: NA         Social Drivers of Health (SDOH) Interventions SDOH Screenings   Food Insecurity: No Food Insecurity (03/30/2024)  Housing: Low Risk  (03/30/2024)  Transportation Needs: No Transportation Needs (03/30/2024)  Utilities: Not At Risk (03/30/2024)  Depression (PHQ2-9): Low Risk  (01/05/2024)  Social Connections: Moderately Integrated (03/30/2024)  Tobacco Use: Low Risk  (03/30/2024)    Readmission Risk Interventions    11/04/2022    1:30 PM  Readmission Risk Prevention Plan  Transportation Screening Complete  PCP or Specialist Appt within 3-5 Days Complete  HRI or Home Care Consult Complete  Social Work Consult for Recovery Care Planning/Counseling Complete  Palliative Care Screening Not Applicable  Medication Review [employer]) Complete

## 2024-04-11 ENCOUNTER — Other Ambulatory Visit: Payer: Self-pay

## 2024-04-11 MED ORDER — ADULT MULTIVITAMIN W/MINERALS CH: 1.0000 | ORAL_TABLET | Freq: Every day | ORAL | Status: AC

## 2024-04-11 MED ORDER — CYANOCOBALAMIN 1000 MCG PO TABS: 1000.0000 ug | ORAL_TABLET | Freq: Every day | ORAL | Status: AC

## 2024-04-11 MED ORDER — ZOLPIDEM TARTRATE 5 MG PO TABS: 5.0000 mg | ORAL_TABLET | Freq: Every evening | ORAL | 0 refills | Status: DC | PRN

## 2024-04-11 MED ORDER — SENNOSIDES-DOCUSATE SODIUM 8.6-50 MG PO TABS: 1.0000 | ORAL_TABLET | Freq: Two times a day (BID) | ORAL | Status: DC

## 2024-04-11 NOTE — Plan of Care (Signed)
 Peer to peer completed with [employer] regarding SNF. I was told that since the patient is CGA assist, he does not meet criteria for a SNF rehab stay. Communicated to TOC.

## 2024-04-11 NOTE — Care Management Important Message (Signed)
 Important Message  Patient Details IM Letter given. Name: Alexander Rich MRN: [id number] Date of Birth: 01-Nov-1925   Important Message Given:  Yes - Medicare IM     Melba Ates 04/11/2024, 12:49 PM

## 2024-04-11 NOTE — Discharge Summary (Signed)
 Physician Discharge Summary  Alexander Rich FMW:[id number] DOB: 05/06/1925 DOA: 03/30/2024  PCP: Seabron Lenis, MD  Admit date: 03/30/2024 Discharge date: 04/11/2024   Consults:  Palliative care Procedures:  None Code status - DNR   Discharge Diagnoses:   Principal Problem:   Rhabdomyolysis Active Problems:   Generalized weakness   Prostate cancer metastatic to bone [facility])   Essential hypertension   Thrombocytopenia   Sinus bradycardia   B12 deficiency   Macrocytosis   Mild protein malnutrition   Falls   Palliative care encounter   Goals of care, counseling/discussion   Counseling and coordination of care   DNR (do not resuscitate)     Brief hospital course: This is a 89 year old male with history of duodenal ulcer, upper GI bleed, hypertension, prostate cancer metastatic to the bone who presented to the hospital for recurrent falls and generalized weakness.  The patient fell at home in the bathtub and could not get up for about 16 hours. In the ED: CK 894.  It rose to 998 and subsequently improved after being treated with IV fluids.  In the ED, the patient was able to walk about 50 feet without difficulty with a walker PT consulted and recommended home health.  The patient  stated that he was not comfortable with going home because he lives alone.  Patient noted to be volume overloaded on 12/2 and IV Lasix  was given.  Echo was ordered.   Subjective:  He has no complaints today.    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- cont Oxycodone  as needed for his back pain    Anxiety Insomnia -  12/5>  we discussed the indications and the side effects and he decided to start a low-dose of Xanax  and Ambien - noted to have confusion after Xanax  which had to be discontinued - Ambien  is  helping with insomnia and he would like it given every night   Urinary retention - Continue Flomax  - foley removed 12/4- voided with 1 cc post void residual   Dyspnea and elevated BNP - Chest x-ray was suggestive of pulmonary edema on 2/2-she was noted of having some shortness of breath and crackles and IV Lasix  was given - 2D echo completed and shows a normal EF and no diastolic dysfunction   Hypertension Continue losartan    Chronic thrombocytopenia   Generalized weakness - no longer wants to live alone - has been able to ambulate with minimal assistance in the hospital with a walker - insurance is denying a SNF/ Rehab admission    Discharge Instructions   Allergies as of 04/11/2024       Reactions   Ibuprofen Other (See Comments)   GI bleed   Nebivolol Hcl Other (See Comments)   Felt blah; low energy   Pravastatin Other (See Comments)   Lethargy   Sertraline Hcl Other (See Comments)   Terrible feeling   Simvastatin Other (See Comments)   Lethargy   Trazodone Other (See Comments)   Foggy-headed        Medication List     STOP taking these medications    furosemide  20 MG tablet Commonly known as: LASIX        TAKE these medications    abiraterone  acetate 250 MG tablet Commonly known as: ZYTIGA  Take 4 tablets (1,000 mg total) by mouth daily on  an empty stomach 1 hour before meals OR 2 hours after a meal   acetaminophen  325 MG tablet Commonly known as: TYLENOL  Take 1 tablet (325 mg total) by mouth every 6 (six) hours as needed for mild pain (or Fever >/= 101).   cyanocobalamin  1000 MCG tablet Take 1 tablet (1,000 mcg total) by mouth daily.   losartan  100 MG tablet Commonly known as: COZAAR  Take 1 tablet (100 mg total) by mouth in the morning.   Lubriderm Advanced Therapy Lotn Apply 1 application  topically 3 (three) times daily as needed (for skin dryness - arms and chest).   multivitamin with minerals Tabs tablet Take 1 tablet by mouth  daily.   oxyCODONE  5 MG immediate release tablet Commonly known as: Oxy IR/ROXICODONE  Take 1-2 tablets (5-10 mg total) by mouth every 6 (six) hours as needed for severe pain (pain score 7-10). What changed:  when to take this additional instructions   polyethylene glycol 17 g packet Commonly known as: MIRALAX  / GLYCOLAX  Take 17 g by mouth daily as needed. Hold for diarrhea. Take when taking percocet   predniSONE  5 MG tablet Commonly known as: DELTASONE  Take 1 tablet (5 mg total) by mouth daily with breakfast.   senna-docusate 8.6-50 MG tablet Commonly known as: Senokot-S Take 1 tablet by mouth 2 (two) times daily.   Systane Complete PF 0.6 % Soln Generic drug: Propylene Glycol (PF) Place 1 drop into both eyes in the morning, at noon, in the evening, and at bedtime.   tamsulosin  0.4 MG Caps capsule Commonly known as: FLOMAX  Take 0.4 mg by mouth in the morning.   Vitamin B Complex Tabs Take 1 tablet by mouth daily.   vitamin C 1000 MG tablet Take 1,000 mg by mouth daily.   Vitamin D3 1000 units Caps Take 1,000 Units by mouth daily.   zolpidem  5 MG tablet Commonly known as: AMBIEN  Take 1 tablet (5 mg total) by mouth at bedtime as needed for sleep.        Contact information for follow-up providers     Seabron Lenis, MD.   Specialty: Family Medicine Contact information: [address] [place] [address] [phone]              Contact information for after-discharge care     Destination     [facility] .   Service: Skilled Nursing Contact information: [address] [place]  [address] [phone]                        The results of significant diagnostics from this hospitalization (including imaging, microbiology, ancillary and laboratory) are listed below for reference.    ECHOCARDIOGRAM COMPLETE Result Date: 04/05/2024    ECHOCARDIOGRAM REPORT   Patient Name:   Alexander Rich Date of Exam:  04/05/2024 Medical Rec #:  [id number]       Height:       67.0 in Accession #:    [id number]      Weight:       169.3 lb Date of Birth:  08/21/1925      BSA:          1.884 m Patient Age:    89 years        BP:           128/65 mmHg Patient Gender: M               HR:  72 bpm. Exam Location:  Inpatient Procedure: 2D Echo (Both Spectral and Color Flow Doppler were utilized during            procedure). Indications:    CHF  History:        Patient has no prior history of Echocardiogram examinations.  Sonographer:    Charmaine Gaskins Referring Phys: [id number] AMRIT ADHIKARI  Sonographer Comments: Technically challenging study due to limited acoustic windows, Technically difficult study due to poor echo windows and suboptimal subcostal window. IMPRESSIONS  1. Left ventricular ejection fraction, by estimation, is 60 to 65%. Left ventricular ejection fraction by 2D MOD biplane is 62.3 %. The left ventricle has normal function. The left ventricle has no regional wall motion abnormalities. Left ventricular diastolic parameters were normal.  2. Right ventricular systolic function is normal. The right ventricular size is normal.  3. The mitral valve is normal in structure. Mild mitral valve regurgitation. No evidence of mitral stenosis.  4. The aortic valve is normal in structure. Aortic valve regurgitation is mild. Aortic valve sclerosis/calcification is present, without any evidence of aortic stenosis.  5. There is mild dilatation of the ascending aorta, measuring 42 mm.  6. The inferior vena cava is normal in size with greater than 50% respiratory variability, suggesting right atrial pressure of 3 mmHg. Comparison(s): No prior Echocardiogram. FINDINGS  Left Ventricle: Left ventricular ejection fraction, by estimation, is 60 to 65%. Left ventricular ejection fraction by 2D MOD biplane is 62.3 %. The left ventricle has normal function. The left ventricle has no regional wall motion abnormalities. The left ventricular  internal cavity size was normal in size. There is no left ventricular hypertrophy. Left ventricular diastolic parameters were normal. Right Ventricle: The right ventricular size is normal. No increase in right ventricular wall thickness. Right ventricular systolic function is normal. Left Atrium: Left atrial size was normal in size. Right Atrium: Right atrial size was normal in size. Pericardium: There is no evidence of pericardial effusion. Mitral Valve: The mitral valve is normal in structure. Mild mitral valve regurgitation. No evidence of mitral valve stenosis. MV peak gradient, 4.6 mmHg. The mean mitral valve gradient is 1.0 mmHg. Tricuspid Valve: The tricuspid valve is normal in structure. Tricuspid valve regurgitation is not demonstrated. No evidence of tricuspid stenosis. Aortic Valve: The aortic valve is normal in structure. Aortic valve regurgitation is mild. Aortic regurgitation PHT measures 500 msec. Aortic valve sclerosis/calcification is present, without any evidence of aortic stenosis. Aortic valve mean gradient measures 10.0 mmHg. Aortic valve peak gradient measures 22.5 mmHg. Aortic valve area, by VTI measures 2.09 cm. Pulmonic Valve: The pulmonic valve was normal in structure. Pulmonic valve regurgitation is mild. No evidence of pulmonic stenosis. Aorta: The aortic root is normal in size and structure. There is mild dilatation of the ascending aorta, measuring 42 mm. Venous: The inferior vena cava is normal in size with greater than 50% respiratory variability, suggesting right atrial pressure of 3 mmHg. IAS/Shunts: No atrial level shunt detected by color flow Doppler.  LEFT VENTRICLE PLAX 2D                        Biplane EF (MOD) LVIDd:         4.30 cm         LV Biplane EF:   Left LVIDs:         2.40 cm  ventricular LV PW:         0.90 cm                          ejection LV IVS:        0.80 cm                          fraction by LVOT diam:     2.30 cm                           2D MOD LV SV:         88                               biplane is LV SV Index:   47                               62.3 %. LVOT Area:     4.15 cm                                Diastology                                LV e' medial:    7.62 cm/s LV Volumes (MOD)               LV E/e' medial:  10.1 LV vol d, MOD    59.3 ml       LV e' lateral:   11.50 cm/s A2C:                           LV E/e' lateral: 6.7 LV vol d, MOD    61.4 ml A4C: LV vol s, MOD    26.6 ml A2C: LV vol s, MOD    19.1 ml A4C: LV SV MOD A2C:   32.7 ml LV SV MOD A4C:   61.4 ml LV SV MOD BP:    37.9 ml RIGHT VENTRICLE RV Basal diam:  3.10 cm RV Mid diam:    3.10 cm LEFT ATRIUM             Index        RIGHT ATRIUM           Index LA diam:        3.50 cm 1.86 cm/m   RA Area:     12.60 cm LA Vol (A2C):   43.4 ml 23.04 ml/m  RA Volume:   28.20 ml  14.97 ml/m LA Vol (A4C):   61.4 ml 32.59 ml/m LA Biplane Vol: 54.3 ml 28.82 ml/m  AORTIC VALVE                     PULMONIC VALVE AV Area (Vmax):    1.91 cm      PR End Diast Vel: 4.93 msec AV Area (Vmean):   2.21 cm AV Area (VTI):     2.09 cm AV Vmax:           237.00 cm/s AV Vmean:          147.000 cm/s AV  VTI:            0.420 m AV Peak Grad:      22.5 mmHg AV Mean Grad:      10.0 mmHg LVOT Vmax:         109.00 cm/s LVOT Vmean:        78.100 cm/s LVOT VTI:          0.211 m LVOT/AV VTI ratio: 0.50 AI PHT:            500 msec  AORTA Ao Root diam: 3.90 cm Ao Asc diam:  4.20 cm MITRAL VALVE                TRICUSPID VALVE MV Area (PHT): 3.10 cm     TR Peak grad:   26.8 mmHg MV Area VTI:   2.88 cm     TR Vmax:        259.00 cm/s MV Peak grad:  4.6 mmHg MV Mean grad:  1.0 mmHg     SHUNTS MV Vmax:       1.07 m/s     Systemic VTI:  0.21 m MV Vmean:      51.9 cm/s    Systemic Diam: 2.30 cm MV E velocity: 77.10 cm/s MV A velocity: 107.00 cm/s MV E/A ratio:  0.72 Franck Azobou Tonleu Electronically signed by Joelle Cedars Tonleu Signature Date/Time: 04/05/2024/5:46:35 PM    Final    DG CHEST PORT 1  VIEW Result Date: 04/04/2024 CLINICAL DATA:  Short of breath EXAM: PORTABLE CHEST 1 VIEW COMPARISON:  Prior chest x-ray 11/02/2022 FINDINGS: Increased pulmonary vascular congestion with interstitial prominence and bibasilar atelectasis. Findings suggest mild interstitial edema. Stable mild cardiomegaly. Atherosclerotic calcifications are present in the transverse aorta. Probable trace bilateral pleural effusions. No pneumothorax. No acute osseous abnormality. IMPRESSION: 1. Findings are most consistent with mild CHF. Stable cardiomegaly with increased pulmonary vascular congestion, mild interstitial edema and likely small bilateral pleural effusions. Electronically Signed   By: Wilkie Lent M.D.   On: 04/04/2024 09:59   CT CHEST ABDOMEN PELVIS W CONTRAST Result Date: 03/30/2024 CLINICAL DATA:  Repeated falls. EXAM: CT CHEST, ABDOMEN, AND PELVIS WITH CONTRAST TECHNIQUE: Multidetector CT imaging of the chest, abdomen and pelvis was performed following the standard protocol during bolus administration of intravenous contrast. RADIATION DOSE REDUCTION: This exam was performed according to the departmental dose-optimization program which includes automated exposure control, adjustment of the mA and/or kV according to patient size and/or use of iterative reconstruction technique. CONTRAST:  OMNIPAQUE  IOHEXOL  300 MG/ML  SOLN COMPARISON:  07/17/2022. FINDINGS: CT CHEST FINDINGS Cardiovascular: Heart normal in size. Three-vessel coronary artery calcifications. No pericardial effusion. Great vessels normal in caliber. Aortic atherosclerosis. Mediastinum/Nodes: No mediastinal hematoma. No neck base, mediastinal or hilar masses. No enlarged lymph nodes. Trachea esophagus are unremarkable. Lungs/Pleura: Trace pleural effusions. Bronchiectasis with associated Peri broncho vascular opacities in the right lower lobe. Linear type opacity noted at the base of the left lower lobe. Mild peripheral areas of interstitial  thickening bilaterally. No lung mass or suspicious nodule. Scattered tiny stable nodules, benign. No pneumothorax. Musculoskeletal: No acute fracture. Sclerotic changes of the T1 vertebra, stable from the prior CT. No chest wall mass. CT ABDOMEN PELVIS FINDINGS Hepatobiliary: Liver normal in size and overall attenuation. Multiple liver cysts, largest measuring 7.8 cm, stable from the prior CT. Gallbladder is distended. Tiny stone in the dependent gallbladder. No wall thickening or inflammation. No bile duct dilation. Pancreas: Partial fatty replacement of the pancreas.  No pancreatic mass or inflammation. Spleen: Normal in size without focal abnormality. Adrenals/Urinary Tract: Normal adrenal glands. Kidneys normal in overall size, orientation and position with symmetric enhancement. Several renal cortical cysts, largest exophytic from the lateral lower pole the right kidney, 2.8 cm. No follow-up recommended. No stones. No hydronephrosis. Normal ureters. Normal bladder. Stomach/Bowel: Moderate hiatal hernia. Stomach otherwise unremarkable. Small bowel and colon are normal in caliber. No wall thickening. No evidence of a bowel injury or mesenteric hematoma. No inflammation. Scattered diverticula mostly along the left colon. No diverticulitis. Vascular/Lymphatic: No vascular injury. Aortic atherosclerosis. No aneurysm. No enlarged lymph nodes. Reproductive: Enlarged prostate, 5.8 x 4.8 cm transversely, stable. Other: Small fat containing inguinal hernias, right greater than left, stable. No ascites. Musculoskeletal: No acute fracture. Depression of the upper endplate of L3 by an apparent prominent Schmorl's node. There is sclerosis of the posterior elements, most evident involving the pedicle and facet on the left of L3. There are additional small sclerotic lesions. Sclerotic changes at L3 have progressed since the prior CT. IMPRESSION: 1. No acute injury to the chest, abdomen or pelvis. 2. Chronic bronchiectasis in  the right lower lobe with peribronchovascular opacities consistent with atelectasis/scarring. Consider a component of acute bronchitis if there are consistent clinical findings. Trace bilateral pleural effusions. 3. Areas of skeletal sclerosis, most evident in the lumbar spine, consistent with blastic prostate carcinoma metastases. Sclerotic changes have progressed in the lumbar spine since the prior exam. 4. No current enlarged lymph nodes. Bulky adenopathy noted on the prior abdomen pelvis CT is no longer visualized. 5. Chronic findings include aortic atherosclerosis, moderate hiatal hernia, liver cysts and colonic diverticula. 6. Stable prostate enlargement. Electronically Signed   By: Alm Parkins M.D.   On: 03/30/2024 15:11   CT Cervical Spine Wo Contrast Result Date: 03/30/2024 CLINICAL DATA:  Multiple falls, history of metastatic prostate cancer EXAM: CT CERVICAL SPINE WITHOUT CONTRAST TECHNIQUE: Multidetector CT imaging of the cervical spine was performed without intravenous contrast. Multiplanar CT image reconstructions were also generated. RADIATION DOSE REDUCTION: This exam was performed according to the departmental dose-optimization program which includes automated exposure control, adjustment of the mA and/or kV according to patient size and/or use of iterative reconstruction technique. COMPARISON:  11/02/2022 FINDINGS: Alignment: Alignment is anatomic. Skull base and vertebrae: No acute displaced fracture. Chronic sclerosis of the T1 vertebral body compatible with known metastatic prostate cancer. No other destructive bony abnormalities. Soft tissues and spinal canal: No prevertebral fluid or swelling. No visible canal hematoma. Disc levels: Multilevel facet hypertrophic changes with bony fusion across the left C4-5 facet joint. There is partial bony fusion across the C4-5 disc space as well. Moderate spondylosis at C3-4. Upper chest: Airway is patent.  Lung apices are clear. Other: Reconstructed  images demonstrate no additional findings. IMPRESSION: 1. No acute cervical spine fracture. 2. Stable multilevel cervical degenerative changes. 3. Stable sclerotic metastasis at the T1 vertebral body. Electronically Signed   By: Ozell Daring M.D.   On: 03/30/2024 15:02   CT Head Wo Contrast Result Date: 03/30/2024 CLINICAL DATA:  Multiple falls, head trauma EXAM: CT HEAD WITHOUT CONTRAST TECHNIQUE: Contiguous axial images were obtained from the base of the skull through the vertex without intravenous contrast. RADIATION DOSE REDUCTION: This exam was performed according to the departmental dose-optimization program which includes automated exposure control, adjustment of the mA and/or kV according to patient size and/or use of iterative reconstruction technique. COMPARISON:  11/02/2022 FINDINGS: Brain: Chronic small vessel ischemic changes are seen within  the periventricular white matter and bilateral thalami. No acute infarct or hemorrhage. Lateral ventricles and midline structures are unremarkable. No acute extra-axial fluid collections. No mass effect. Vascular: No hyperdense vessel or unexpected calcification. Skull: Normal. Negative for fracture or focal lesion. Sinuses/Orbits: No acute finding. Other: None. IMPRESSION: 1. No acute intracranial process. Electronically Signed   By: Ozell Daring M.D.   On: 03/30/2024 14:58   DG Shoulder Right Result Date: 03/30/2024 CLINICAL DATA:  Clemens yesterday afternoon, found this morning. Right shoulder pain. EXAM: RIGHT SHOULDER - 2+ VIEW COMPARISON:  None Available. FINDINGS: No fracture.  No bone lesion. Glenohumeral and AC joints are normally aligned. Mild AC joint osteoarthritis. Narrowed subacromial space suggests a chronic rotator cuff tear. Skeletal structures are demineralized. IMPRESSION: 1. No fracture or dislocation. Electronically Signed   By: Alm Parkins M.D.   On: 03/30/2024 14:39   Labs:   Basic Metabolic Panel: Recent Labs  Lab  04/05/24 0515 04/06/24 0813 04/08/24 0731  NA 138 138 141  K 3.1* 3.5 3.6  CL 106 107 108  CO2 22 24 23   GLUCOSE 95 93 80  BUN 17 18 16   CREATININE 1.12 1.27* 1.00  CALCIUM 8.3* 8.3* 8.4*  MG 2.3  --   --      CBC: No results for input(s): WBC, NEUTROABS, HGB, HCT, MCV, PLT in the last 168 hours.       SIGNED:   Angeleigh Chiasson, MD  [facility] 04/11/2024, 12:10 PM Time taking on discharge: 50 minutes

## 2024-04-11 NOTE — TOC Transition Note (Addendum)
 Transition of Care [facility]) - Discharge Note   Patient Details  Name: Alexander Rich MRN: [id number] Date of Birth: August 12, 1925  Transition of Care [facility]) CM/SW Contact:  Toy LITTIE Agar, RN Phone [phone]  04/11/2024, 1:07 PM   Clinical Narrative:    TOC acknowledges Home health orders. CM at bedside to explained that insurance has denied SNF and provided option for appeal. Patient  states that he is ok with going home and would like his niece [name] updated. CM provided patient with medicare.gov list of agencies for [facility] choices. Patient does not have a preference. Referral has been accepted by [facility]. AVS has been updated. CM spoke with Niece [name] to update on insurance denial and rights to appeal. Per Clara the family does not feel that patient is able to care for himself at home alone. Clara and her husband have agreed to pick patient up for discharge.   1325 Clara's husband called back to state that family will be filing an appeal. CM has provided family with appeal contact information. Team has been updated.      Barriers to Discharge: Continued Medical Work up   Patient Goals and CMS Choice Patient states their goals for this hospitalization and ongoing recovery are:: Wants to go to rehab CMS Medicare.gov Compare Post Acute Care list provided to:: Patient Choice offered to / list presented to : Patient (Niece per patient request [name]) [place] ownership interest in [facility].provided to:: Patient    Discharge Placement                       Discharge Plan and Services Additional resources added to the After Visit Summary for   In-house Referral: NA Discharge Planning Services: CM Consult Post Acute Care Choice: Skilled Nursing Facility          DME Arranged: N/A DME Agency: NA       HH Arranged: NA HH Agency: NA        Social Drivers of Health (SDOH) Interventions SDOH Screenings   Food Insecurity: No Food  Insecurity (03/30/2024)  Housing: Low Risk  (03/30/2024)  Transportation Needs: No Transportation Needs (03/30/2024)  Utilities: Not At Risk (03/30/2024)  Depression (PHQ2-9): Low Risk  (01/05/2024)  Social Connections: Moderately Integrated (03/30/2024)  Tobacco Use: Low Risk  (03/30/2024)     Readmission Risk Interventions    11/04/2022    1:30 PM  Readmission Risk Prevention Plan  Transportation Screening Complete  PCP or Specialist Appt within 3-5 Days Complete  HRI or Home Care Consult Complete  Social Work Consult for Recovery Care Planning/Counseling Complete  Palliative Care Screening Not Applicable  Medication Review Oceanographer) Complete

## 2024-04-11 NOTE — TOC Progression Note (Signed)
 Transition of Care [facility]) - Progression Note    Patient Details  Name: Alexander Rich MRN: [id number] Date of Birth: 11/19/1925  Transition of Care [facility]) CM/SW Contact  Heather DELENA Saltness, LCSW Phone Number: 04/11/2024, 10:13 AM  Clinical Narrative:    CSW received phone call from Piqua, staff member, at [facility] regarding pt's request for SNF rehab. Per Landry, pt's request for SNF rehab was denied due to pt's needs being able to be met at lower level of care. If pt wishes to appeal denial for SNF, phone number: [phone], and fax: [phone]. CSW made CM aware.   Expected Discharge Plan: Skilled Nursing Facility Barriers to Discharge: Continued Medical Work up   Expected Discharge Plan and Services In-house Referral: NA Discharge Planning Services: CM Consult Post Acute Care Choice: Skilled Nursing Facility Living arrangements for the past 2 months: Single Family Home Expected Discharge Date: 04/11/24               DME Arranged: N/A DME Agency: NA       HH Arranged: NA HH Agency: NA         Social Drivers of Health (SDOH) Interventions SDOH Screenings   Food Insecurity: No Food Insecurity (03/30/2024)  Housing: Low Risk  (03/30/2024)  Transportation Needs: No Transportation Needs (03/30/2024)  Utilities: Not At Risk (03/30/2024)  Depression (PHQ2-9): Low Risk  (01/05/2024)  Social Connections: Moderately Integrated (03/30/2024)  Tobacco Use: Low Risk  (03/30/2024)    Readmission Risk Interventions    11/04/2022    1:30 PM  Readmission Risk Prevention Plan  Transportation Screening Complete  PCP or Specialist Appt within 3-5 Days Complete  HRI or Home Care Consult Complete  Social Work Consult for Recovery Care Planning/Counseling Complete  Palliative Care Screening Not Applicable  Medication Review Oceanographer) Complete    Signed: Heather Saltness, MSW, LCSW Clinical Social Worker Inpatient Care Management 04/11/2024 10:15 AM

## 2024-04-11 NOTE — Plan of Care (Signed)

## 2024-04-12 ENCOUNTER — Other Ambulatory Visit: Payer: Self-pay

## 2024-04-12 ENCOUNTER — Inpatient Hospital Stay (HOSPITAL_COMMUNITY)

## 2024-04-12 DIAGNOSIS — K449 Diaphragmatic hernia without obstruction or gangrene: Secondary | ICD-10-CM | POA: Diagnosis not present

## 2024-04-12 DIAGNOSIS — E878 Other disorders of electrolyte and fluid balance, not elsewhere classified: Secondary | ICD-10-CM

## 2024-04-12 DIAGNOSIS — K224 Dyskinesia of esophagus: Secondary | ICD-10-CM | POA: Diagnosis not present

## 2024-04-12 DIAGNOSIS — R7989 Other specified abnormal findings of blood chemistry: Secondary | ICD-10-CM

## 2024-04-12 NOTE — Progress Notes (Signed)
 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  The patient fell at home in the bathtub and could not get up for about 16 hours. Treated for nontraumatic rhabdomyolysis with IV fluid.  Assessment and Plan: Nontraumatic rhabdomyolysis Unwitnessed fall. Presents with an unwitnessed fall. Significant weakness unable to get up on his own. CK was elevated. Treated with IV fluid.   Prostate cancer metastatic to bone (HCC) cont Zytiga  and Prednisone  Has mets to L spine- cont Oxycodone  as needed for his back pain    Anxiety Insomnia Currently on Ambien .   Urinary retention Required Foley catheter upon admission. Continue Flomax  foley removed 12/4- voided with 1 cc post void residual   Hypertension Continue losartan    Chronic thrombocytopenia   Generalized weakness PT OT consulted.  Awaiting placement.  Subjective: Difficulty with swallowing reported.  No nausea no vomiting no fever no chills.  Minimal oral intake reported secondary to that.  Physical Exam: Basal crackles. S1-S2 present present bowel sound present No edema.  Data Reviewed: I have Reviewed nursing notes, Vitals, and Lab results. Since last encounter, pertinent lab results x-ray esophagus   . I have ordered test including CBC BMP  .   Disposition: Status is: Inpatient Remains inpatient appropriate because: Awaiting placement.  enoxaparin  (LOVENOX ) injection 40 mg Start: 03/30/24 2200   Family Communication: No at bedside Level of care: Telemetry   Vitals:   04/11/24 1352 04/11/24 2110 04/12/24 0554 04/12/24 1430  BP: (!) 156/75 (!) 176/80 (!) 168/81 138/76  Pulse: 80 79 73 67  Resp: 20 17 17 20   Temp: 97.7 F  (36.5 C) 98 F (36.7 C) 97.6 F (36.4 C) 97.6 F (36.4 C)  TempSrc: Oral     SpO2: 97% 96% 97% 96%  Weight:      Height:         Author: Yetta Blanch, MD 04/12/2024 5:47 PM  Please look on [email or web address] to find out who is on call.

## 2024-04-12 NOTE — Progress Notes (Signed)
 Physical Therapy Treatment Patient Details Name: Alexander Rich MRN: [id number] DOB: 03-Dec-1925 Today's Date: 04/12/2024   History of Present Illness 89 yo male admitted to hospital on 03/30/2024 due to 2 falls secondary to progressive weakness, pt found to have rhabdomyolysis. 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 Patient noted to be volume overloaded on 12/2 and IV Lasix  was given.    PT Comments  Pt seen for PT tx with pt agreeable. Pt is successful with sit<>stand transfers but from elevated surfaces, he requires min assist for successful sit>Stand from lower surfaces. Pt ambulates with RW & CGA<>supervision but pushes RW out in front of him. Pt too fatigued after gait to perform exercises but did need to toilet. Pt requires min assist for standing balance. Pt is currently a high fall risk & lives alone with little assistance. Pt would benefit from ongoing skilled PT treatment & post acute rehab <3 hours therapy/day to increase independence with mobility, reduce fall risk & reduce readmission risk.  Anticipate pt will be unable to perform all ADLs without great fatigue & fall risk if he were to return home alone.   If plan is discharge home, recommend the following: A little help with walking and/or transfers;A little help with bathing/dressing/bathroom;Assistance with cooking/housework;Assist for transportation;Help with stairs or ramp for entrance   Can travel by private vehicle     Yes  Equipment Recommendations  None recommended by PT    Recommendations for Other Services       Precautions / Restrictions Precautions Precautions: Fall Restrictions Weight Bearing Restrictions Per Provider Order: No     Mobility  Bed Mobility Overal bed mobility: Needs Assistance Bed Mobility: Sit to Supine       Sit to supine: Supervision        Transfers Overall  transfer level: Needs assistance Equipment used: Rolling walker (2 wheels) Transfers: Sit to/from Stand Sit to Stand: Contact guard assist, From elevated surface, Min assist           General transfer comment: CGA from recliner with cuing re: hand placement to push up/reach back prior to sitting, min assist from low EOB, min assist from low toilet with cuing re: use of grab bar    Ambulation/Gait Ambulation/Gait assistance: Contact guard assist, Supervision Gait Distance (Feet): 200 Feet Assistive device: Rolling walker (2 wheels) Gait Pattern/deviations: Decreased step length - right, Decreased step length - left, Step-through pattern, Trunk flexed, Decreased stride length Gait velocity: decreased     General Gait Details: Begins to push RW out in front of him as he fatigues, cuing re: RW positioning when turning to sink.   Stairs             Wheelchair Mobility     Tilt Bed    Modified Rankin (Stroke Patients Only)       Balance Overall balance assessment: Needs assistance Sitting-balance support: Feet supported, Single extremity supported, No upper extremity supported Sitting balance-Leahy Scale: Good Sitting balance - Comments: peri care sitting on toilet with supervision   Standing balance support: No upper extremity supported, During functional activity Standing balance-Leahy Scale: Poor Standing balance comment: min assist for standing for hand hygiene at sink                            Communication Communication Communication: No apparent difficulties  Cognition  Arousal: Alert Behavior During Therapy: WFL for tasks assessed/performed   PT - Cognitive impairments: No apparent impairments                         Following commands: Intact      Cueing Cueing Techniques: Verbal cues  Exercises      General Comments General comments (skin integrity, edema, etc.): continent BM on toilet      Pertinent Vitals/Pain Pain  Assessment Pain Assessment: Faces Faces Pain Scale: Hurts little more Pain Location: back Pain Descriptors / Indicators: Discomfort Pain Intervention(s): Monitored during session    Home Living                          Prior Function            PT Goals (current goals can now be found in the care plan section) Acute Rehab PT Goals Patient Stated Goal: to go to a home and get some help PT Goal Formulation: With patient Potential to Achieve Goals: Fair Progress towards PT goals: Progressing toward goals    Frequency           PT Plan      Co-evaluation              AM-PAC PT 6 Clicks Mobility   Outcome Measure  Help needed turning from your back to your side while in a flat bed without using bedrails?: None Help needed moving from lying on your back to sitting on the side of a flat bed without using bedrails?: A Little Help needed moving to and from a bed to a chair (including a wheelchair)?: A Little Help needed standing up from a chair using your arms (e.g., wheelchair or bedside chair)?: A Little Help needed to walk in hospital room?: A Little Help needed climbing 3-5 steps with a railing? : A Little 6 Click Score: 19    End of Session   Activity Tolerance: Patient tolerated treatment well;Patient limited by fatigue Patient left: in bed;with call bell/phone within reach;with bed alarm set Nurse Communication: Mobility status PT Visit Diagnosis: Unsteadiness on feet (R26.81);Other abnormalities of gait and mobility (R26.89);Repeated falls (R29.6);Muscle weakness (generalized) (M62.81);Difficulty in walking, not elsewhere classified (R26.2)     Time: 1255-1316 PT Time Calculation (min) (ACUTE ONLY): 21 min  Charges:    $Therapeutic Activity: 8-22 mins PT General Charges $$ ACUTE PT VISIT: 1 Visit                     Richerd Pinal, PT, DPT 04/12/24, 1:22 PM   Richerd CHRISTELLA Pinal 04/12/2024, 1:20 PM

## 2024-04-12 NOTE — Plan of Care (Signed)
  Problem: Education: Goal: Knowledge of General Education information will improve Description: Including pain rating scale, medication(s)/side effects and non-pharmacologic comfort measures Outcome: Progressing   Problem: Health Behavior/Discharge Planning: Goal: Ability to manage health-related needs will improve Outcome: Progressing   Problem: Clinical Measurements: Goal: Respiratory complications will improve Outcome: Progressing   Problem: Nutrition: Goal: Adequate nutrition will be maintained Outcome: Progressing   Problem: Elimination: Goal: Will not experience complications related to urinary retention Outcome: Progressing   Problem: Pain Managment: Goal: General experience of comfort will improve and/or be controlled Outcome: Progressing

## 2024-04-12 NOTE — TOC Progression Note (Addendum)
 Transition of Care [facility]) - Progression Note    Patient Details  Name: Alexander Rich MRN: [id number] Date of Birth: 1926-02-08  Transition of Care [facility]) CM/SW Contact  Toy LITTIE Agar, RN Phone [phone]  04/12/2024, 2:03 PM  Clinical Narrative:    CM spoke with brother in law who states that family is currently filing insurance appeal.    Expected Discharge Plan: Skilled Nursing Facility Barriers to Discharge: Continued Medical Work up               Expected Discharge Plan and Services In-house Referral: NA Discharge Planning Services: CM Consult Post Acute Care Choice: Skilled Nursing Facility Living arrangements for the past 2 months: Single Family Home Expected Discharge Date: 04/11/24               DME Arranged: N/A DME Agency: NA       HH Arranged: NA HH Agency: NA         Social Drivers of Health (SDOH) Interventions SDOH Screenings   Food Insecurity: No Food Insecurity (03/30/2024)  Housing: Low Risk  (03/30/2024)  Transportation Needs: No Transportation Needs (03/30/2024)  Utilities: Not At Risk (03/30/2024)  Depression (PHQ2-9): Low Risk  (01/05/2024)  Social Connections: Moderately Integrated (03/30/2024)  Tobacco Use: Low Risk  (03/30/2024)    Readmission Risk Interventions    11/04/2022    1:30 PM  Readmission Risk Prevention Plan  Transportation Screening Complete  PCP or Specialist Appt within 3-5 Days Complete  HRI or Home Care Consult Complete  Social Work Consult for Recovery Care Planning/Counseling Complete  Palliative Care Screening Not Applicable  Medication Review Oceanographer) Complete

## 2024-04-12 NOTE — Progress Notes (Signed)
°  Daily Progress Note   Patient Name: Alexander Rich       Date: 04/12/2024 DOB: 1925-10-28  Age: 89 y.o. MRN#: [id number] Attending Physician: Tobie Yetta HERO, MD Primary Care Physician: Seabron Lenis, MD Admit Date: 03/30/2024 Length of Stay: 6 days  Reason for Consultation/Follow-up: Establishing goals of care  Subjective:   Reviewed EMR including recent documentation from hospitalist and TOC.   Presented to bedside to see patient.  Patient laying comfortably in bed.  Patient    Discussed care with hospitalist, TOC, and bedside RN to coordinate care.  Objective:   Vital Signs:  BP (!) 168/81 (BP Location: Left Arm)   Pulse 73   Temp 97.6 F (36.4 C)   Resp 17   Ht 5' 7 (1.702 m)   Wt 76.8 kg   SpO2 97%   BMI 26.52 kg/m   Physical Exam: General: NAD, alert, chronically ill-appearing, elderly Cardiovascular: RRR Respiratory: not in respiratory distress Abdomen: not distended Neuro: Awake, interactive Psych: appropriately answers all questions  Assessment & Plan:   Assessment: Patient is a 89 year old male with a past medical history of upper GI bleed, duodenal ulcer, hypertension, metastatic prostate cancer to bone, chronic back pain, and recurrent falls who was admitted on 03/30/2024 after a fall at home.  Patient lived alone and normally ambulated with help of a walker.  Patient noted that he had a fall at home on the floor of the bathtub and could not get up for around 16 hours.  Since being hospitalized, patient has received management for rhabdomyolysis, electrolyte abnormalities, and elevated BNP.  Palliative medicine team consulted to assist with complex medical decision making.   Recommendations/Plan: # Complex medical decision making/goals of care:     - Disposition pending.                 -  Code Status: Limited: Do not attempt resuscitation (DNR) -DNR-LIMITED -Do Not Intubate/DNI    # Psycho-social/Spiritual Support:  - Support System: Niece-Alexander  Rich   # Discharge Planning:  pending, TOC note reviewed, TOC in contact with niece. Family appealing SNF denial.     Thank you for allowing the palliative care team to participate in the care Alexander Rich.  Mod MDM Lonia Serve MD.  Palliative Care Provider PMT # [phone]  If patient remains symptomatic despite maximum doses, please call PMT at [phone] between 0700 and 1900. Outside of these hours, please call attending, as PMT does not have night coverage.

## 2024-04-13 NOTE — Progress Notes (Signed)
 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  The patient fell at home in the bathtub and could not get up for about 16 hours. Treated for nontraumatic rhabdomyolysis with IV fluid.   Assessment and Plan: Nontraumatic rhabdomyolysis Unwitnessed fall. Presents with an unwitnessed fall. Significant weakness unable to get up on his own. CK was elevated minimally Treated with IV fluid.   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   Urinary retention Required Foley catheter upon admission. Continue Flomax  foley removed 12/4, now able to void.   Hypertension Continue losartan    Chronic thrombocytopenia   Generalized weakness PT OT consulted.  Awaiting placement.   Subjective: No acute complaint.  Tenderness difficulty swallowing.  No nausea no vomiting fever no chills.  Physical Exam: Clear to auscultation. S1-S2 present Bowel sounds present.  Data Reviewed: I have Reviewed nursing notes, Vitals, and Lab results.  Disposition: Status is: Inpatient Remains inpatient appropriate because: Awaiting placement.  enoxaparin  (LOVENOX ) injection 40 mg Start: 03/30/24 2200   Family Communication: No one at bedside Level of care: Telemetry   Vitals:   04/12/24 1430 04/12/24 2011 04/13/24 0415 04/13/24 1322  BP: 138/76 (!) 155/72 139/78 130/66  Pulse: 67 (!) 59 70 (!) 57  Resp: 20 18 18 16   Temp: 97.6 F (36.4 C) 98.4 F (36.9 C) 98 F (36.7 C) 97.6 F (36.4 C)  TempSrc:  Oral Oral Oral  SpO2: 96% 95% 98% 96%  Weight:      Height:          Author: Yetta Blanch, MD 04/13/2024 6:42 PM  Please look on [email or web address] to find out who is on call.

## 2024-04-13 NOTE — Hospital Course (Signed)
 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  The patient fell at home in the bathtub and could not get up for about 16 hours. Treated for nontraumatic rhabdomyolysis with IV fluid.   Assessment and Plan: Nontraumatic rhabdomyolysis Unwitnessed fall. Presents with an unwitnessed fall. Significant weakness unable to get up on his own.  Strength improving now. CK was elevated minimally Treated with IV fluid.   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   Urinary retention Required Foley catheter upon admission. Continue Flomax  foley removed 12/4, now able to void.   Hypertension Continue losartan    Chronic thrombocytopenia   Generalized weakness PT OT consulted.  Awaiting placement.

## 2024-04-13 NOTE — Progress Notes (Signed)
 Mobility Specialist Progress Note:   04/13/24 1449  Mobility  Activity Ambulated with assistance  Level of Assistance Contact guard assist, steadying assist  Assistive Device Front wheel walker  Distance Ambulated (ft) 170 ft  Activity Response Tolerated well  Mobility Referral Yes  Mobility visit 1 Mobility  Mobility Specialist Start Time (ACUTE ONLY) 1330  Mobility Specialist Stop Time (ACUTE ONLY) 1341  Mobility Specialist Time Calculation (min) (ACUTE ONLY) 11 min   Pt was received in recliner and agreed to mobility. Limited ambulation due to fatigue. Returned to bed with all needs met. Call bell in reach and bed alarm on.  [employer] - Mobility Specialist

## 2024-04-13 NOTE — Progress Notes (Signed)
 Mobility Specialist Progress Note:   04/13/24 1242  Mobility  Activity Ambulated with assistance  Level of Assistance Minimal assist, patient does 75% or more  Assistive Device Front wheel walker  Distance Ambulated (ft) 20 ft  Activity Response Tolerated well  Mobility Referral Yes  Mobility visit 1 Mobility  Mobility Specialist Start Time (ACUTE ONLY) 1155  Mobility Specialist Stop Time (ACUTE ONLY) 1210  Mobility Specialist Time Calculation (min) (ACUTE ONLY) 15 min   Pt was received in bed and requested to use restroom. No complaints/issues during session. Returned to recliner with all needs met. Call bell in reach and chair alarm on.  [employer] - Mobility Specialist

## 2024-04-13 NOTE — Plan of Care (Signed)
  Problem: Education: Goal: Knowledge of General Education information will improve Description: Including pain rating scale, medication(s)/side effects and non-pharmacologic comfort measures Outcome: Progressing   Problem: Nutrition: Goal: Adequate nutrition will be maintained Outcome: Progressing   Problem: Coping: Goal: Level of anxiety will decrease Outcome: Progressing   Problem: Elimination: Goal: Will not experience complications related to bowel motility Outcome: Progressing Goal: Will not experience complications related to urinary retention Outcome: Progressing   Problem: Pain Managment: Goal: General experience of comfort will improve and/or be controlled Outcome: Progressing

## 2024-04-13 NOTE — Plan of Care (Signed)

## 2024-04-14 NOTE — Plan of Care (Signed)
°  Problem: Education: Goal: Knowledge of General Education information will improve Description: Including pain rating scale, medication(s)/side effects and non-pharmacologic comfort measures Outcome: Progressing   Problem: Clinical Measurements: Goal: Ability to maintain clinical measurements within normal limits will improve Outcome: Progressing Goal: Will remain free from infection Outcome: Progressing   Problem: Activity: Goal: Risk for activity intolerance will decrease Outcome: Progressing   Problem: Nutrition: Goal: Adequate nutrition will be maintained 04/14/2024 0411 by Jerrye Lacinda HERO, RN Outcome: Progressing 04/14/2024 0410 by Jerrye Lacinda HERO, RN Outcome: Progressing   Problem: Coping: Goal: Level of anxiety will decrease 04/14/2024 0411 by Jerrye Lacinda HERO, RN Outcome: Progressing 04/14/2024 0410 by Jerrye Lacinda HERO, RN Outcome: Progressing   Problem: Elimination: Goal: Will not experience complications related to urinary retention Outcome: Progressing   Problem: Pain Managment: Goal: General experience of comfort will improve and/or be controlled Outcome: Progressing

## 2024-04-14 NOTE — Plan of Care (Signed)
  Problem: Education: Goal: Knowledge of General Education information will improve Description: Including pain rating scale, medication(s)/side effects and non-pharmacologic comfort measures Outcome: Progressing   Problem: Clinical Measurements: Goal: Respiratory complications will improve Outcome: Progressing Goal: Cardiovascular complication will be avoided Outcome: Progressing   Problem: Nutrition: Goal: Adequate nutrition will be maintained Outcome: Progressing   Problem: Elimination: Goal: Will not experience complications related to urinary retention Outcome: Progressing   Problem: Pain Managment: Goal: General experience of comfort will improve and/or be controlled Outcome: Progressing

## 2024-04-14 NOTE — Progress Notes (Signed)
 [facility] PROGRESS NOTE  Patient: Alexander Rich FMW:[id number]   PCP: Seabron Lenis, MD DOB: 06-06-1925   DOA: 03/30/2024   DOS: 04/14/2024    Subjective: No acute complaint.  No nausea no vomiting no fever no chills.  Swallowing okay.  Objective:  Vitals:   04/13/24 1322 04/13/24 1956 04/14/24 0702 04/14/24 1259  BP: 130/66 (!) 170/81 (!) 141/71 (!) 94/43  Pulse: (!) 57 78 82 70  Resp: 16 18 18 18   Temp: 97.6 F (36.4 C) 97.9 F (36.6 C) 98.7 F (37.1 C) 97.9 F (36.6 C)  TempSrc: Oral Oral Oral   SpO2: 96% 97% 97% 96%  Weight:      Height:       Clear to auscultation. Bowel sounds audible No edema of lower extremity.  Assessment and plan: Medically stable. Awaiting placement.  Author: Yetta Blanch, MD [facility] 04/14/2024 6:34 PM   If 7PM-7AM, please contact night-coverage at [email or web address]

## 2024-04-14 NOTE — TOC Progression Note (Signed)
 Transition of Care [facility]) - Progression Note    Patient Details  Name: Alexander Rich MRN: [id number] Date of Birth: 08/01/1925  [facility]) CM/SW Contact  Toy LITTIE Agar, RN Phone Number:[phone]  04/14/2024, 12:08 PM  Clinical Narrative:    CM called [employer] [phone] to verify appeal status. Appeal has been received 12/10 appeal ID # [id number]. Call reference # [id number].    Expected Discharge Plan: Skilled Nursing Facility Barriers to Discharge: Continued Medical Work up               Expected Discharge Plan and Services In-house Referral: NA Discharge Planning Services: CM Consult Post Acute Care Choice: Skilled Nursing Facility Living arrangements for the past 2 months: Single Family Home Expected Discharge Date: 04/11/24               DME Arranged: N/A DME Agency: NA       HH Arranged: NA HH Agency: NA         Social Drivers of Health (SDOH) Interventions SDOH Screenings   Food Insecurity: No Food Insecurity (03/30/2024)  Housing: Low Risk (03/30/2024)  Transportation Needs: No Transportation Needs (03/30/2024)  Utilities: Not At Risk (03/30/2024)  Depression (PHQ2-9): Low Risk (01/05/2024)  Social Connections: Moderately Integrated (03/30/2024)  Tobacco Use: Low Risk (03/30/2024)    Readmission Risk Interventions    11/04/2022    1:30 PM  Readmission Risk Prevention Plan  Transportation Screening Complete  PCP or Specialist Appt within 3-5 Days Complete  HRI or Home Care Consult Complete  Social Work Consult for Recovery Care Planning/Counseling Complete  Palliative Care Screening Not Applicable  Medication Review Oceanographer) Complete

## 2024-04-14 NOTE — Progress Notes (Signed)
 Physical Therapy Treatment Patient Details Name: ZAVEN KLEMENS MRN: [id number] DOB: [birth year] Today's Date: 04/14/2024   History of Present Illness 89 yo male admitted to hospital on 03/30/2024 due to 2 falls secondary to progressive weakness, pt found to have rhabdomyolysis. 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 Patient noted to be volume overloaded on 12/2 and IV Lasix  was given.    PT Comments  Pt is a very sharpe 89 year old who was living home alone and amb with NO assistive device. Unfortunately he fell in the bathtub and remained there approx 16 hours.  Assisted OOB to amb in hallway was difficult.  Pt presents with poor balance and required much support through a walker.  Returned to room and performed a BERG Balance Test.  Pt did poorly and scored a very low 16/56 indicating 100% Fall Risk and need for assistance with all mobility. General Gait Details: Once upright, Pt was able to amb a great distance but required the use of a walker for balance/instability.  Prior he did NOT use.  His steps are slightly shuffled and his posture is poor.  Very forward flexed/Kyphotic.  Also slow moving with delayed balance coorection response with poor ability to self correct.  Increased instability with back steps to bed.  HIGH FALL RISK. General transfer comment: Requires anywhere from Min to Mod Assist pending surface height.  Definite use of hands hands to asisst as well as rocking forward momentum.  Increased instability with turns and back steps as well as poor forward flexed posture.  HIGH FALL RISK  LPT has rec Pt will need ST Rehab at SNF to address mobility and functional decline prior to safely returning home.       If plan is discharge home, recommend the following: A little help with walking and/or transfers;A little help with bathing/dressing/bathroom;Assistance with  cooking/housework;Assist for transportation;Help with stairs or ramp for entrance   Can travel by private vehicle     Yes  Equipment Recommendations  None recommended by PT    Recommendations for Other Services       Precautions / Restrictions Precautions Precautions: Fall Precaution/Restrictions Comments: Down in tub 16 hours Restrictions Weight Bearing Restrictions Per Provider Order: No     Mobility  Bed Mobility Overal bed mobility: Needs Assistance Bed Mobility: Supine to Sit     Supine to sit: Mod assist Sit to supine: Mod assist   General bed mobility comments: required increased assist for upper body during supine to EOB due to increased c/o weakness/fatigue and back pain.  Getting back into bed, required Max Assist to support B LE up.  Positioned to comfort.    Transfers Overall transfer level: Needs assistance Equipment used: Rolling walker (2 wheels) Transfers: Sit to/from Stand Sit to Stand: Min assist, Mod assist           General transfer comment: Requires anywhere from Min to Mod Assist pending surface height.  Definite use of hands hands to asisst as well as rocking forward momentum.  Increased instability with turns and back steps as well as poor forward flexed posture.  HIGH FALL RISK    Ambulation/Gait Ambulation/Gait assistance: Contact guard assist, Min assist Gait Distance (Feet): 85 Feet Assistive device: Rolling walker (2 wheels) Gait Pattern/deviations: Decreased step length - right, Decreased step length - left, Step-through pattern, Trunk flexed, Decreased stride length Gait velocity: decreased  General Gait Details: Once upright, Pt was able to amb a great distance but required the use of a walker for balance/instability.  Prior he did NOT use.  His steps are slightly shuffled and his posture is poor.  Very forward flexed/Kyphotic.  Also slow moving with delayed balance coorection response with poor ability to self correct.   Increased instability with back steps to bed.  HIGH FALL RISK.   Stairs             Wheelchair Mobility     Tilt Bed    Modified Rankin (Stroke Patients Only)       Balance Overall balance assessment: Needs assistance           Standing balance-Leahy Scale: Poor                   Standardized Balance Assessment Standardized Balance Assessment : Berg Balance Test Berg Balance Test Sit to Stand: Needs minimal aid to stand or to stabilize Standing Unsupported: Able to stand 30 seconds unsupported Sitting with Back Unsupported but Feet Supported on Floor or Stool: Able to sit safely and securely 2 minutes Stand to Sit: Needs assistance to sit Transfers: Needs one person to assist Standing Unsupported with Eyes Closed: Able to stand 3 seconds Standing Ubsupported with Feet Together: Needs help to attain position and unable to hold for 15 seconds From Standing, Reach Forward with Outstretched Arm: Can reach forward >12 cm safely (5) From Standing Position, Pick up Object from Floor: Unable to pick up shoe, but reaches 2-5 cm (1-2) from shoe and balances independently From Standing Position, Turn to Look Behind Over each Shoulder: Needs supervision when turning Turn 360 Degrees: Needs assistance while turning Standing Unsupported, Alternately Place Feet on Step/Stool: Needs assistance to keep from falling or unable to try Standing Unsupported, One Foot in Front: Loses balance while stepping or standing Standing on One Leg: Unable to try or needs assist to prevent fall Total Score: 16/56 100% Fall Risk requiring assist for all mobility        Communication    Cognition Arousal: Alert Behavior During Therapy: WFL for tasks assessed/performed   PT - Cognitive impairments: No apparent impairments                       PT - Cognition Comments: AxO x 3 sharp Man who lives home alone.        Cueing    Exercises      General Comments General  comments (skin integrity, edema, etc.): BERG score 16/56 indicating 100% FALL RISK requires assist for all mobility      Pertinent Vitals/Pain Pain Assessment Pain Assessment: Faces Faces Pain Scale: Hurts even more Pain Location: back Pain Descriptors / Indicators: Grimacing, Guarding, Radiating Pain Intervention(s): Monitored during session, Repositioned    Home Living                          Prior Function            PT Goals (current goals can now be found in the care plan section) Progress towards PT goals: Progressing toward goals    Frequency    Min 2X/week      PT Plan      Co-evaluation              AM-PAC PT 6 Clicks Mobility   Outcome Measure  Help needed turning from your back to  your side while in a flat bed without using bedrails?: A Lot Help needed moving from lying on your back to sitting on the side of a flat bed without using bedrails?: A Lot Help needed moving to and from a bed to a chair (including a wheelchair)?: A Lot Help needed standing up from a chair using your arms (e.g., wheelchair or bedside chair)?: A Lot Help needed to walk in hospital room?: A Lot Help needed climbing 3-5 steps with a railing? : Total 6 Click Score: 11    End of Session Equipment Utilized During Treatment: Gait belt Activity Tolerance: Patient limited by fatigue Patient left: in bed;with call bell/phone within reach;with bed alarm set Nurse Communication: Mobility status PT Visit Diagnosis: Unsteadiness on feet (R26.81);Other abnormalities of gait and mobility (R26.89);Repeated falls (R29.6);Muscle weakness (generalized) (M62.81);Difficulty in walking, not elsewhere classified (R26.2)     Time: 8965-8942 PT Time Calculation (min) (ACUTE ONLY): 23 min  Charges:    $Gait Training: 8-22 mins $Therapeutic Activity: 8-22 mins PT General Charges $$ ACUTE PT VISIT: 1 Visit                     Katheryn Leap  PTA Acute  Rehabilitation  Services Office M-F          [phone]

## 2024-04-15 NOTE — Progress Notes (Signed)
°  Daily Progress Note   Patient Name: Alexander Rich       Date: 04/15/2024 DOB: 11/04/1925  Age: 89 y.o. MRN#: [id number] Attending Physician: Tobie Yetta HERO, MD Primary Care Physician: Seabron Lenis, MD Admit Date: 03/30/2024 Length of Stay: 9 days  Reason for Consultation/Follow-up: Establishing goals of care  Subjective:   Reviewed EMR including recent documentation from hospitalist and TOC.   Presented to bedside to see patient.  Patient laying comfortably in bed.  Patient     Objective:   Vital Signs:  BP (!) 109/55 (BP Location: Right Arm)   Pulse 92   Temp (!) 97.4 F (36.3 C) (Oral)   Resp 16   Ht 5' 7 (1.702 m)   Wt 76.8 kg   SpO2 98%   BMI 26.52 kg/m   Physical Exam: General: NAD, alert, chronically ill-appearing, elderly Cardiovascular: RRR Respiratory: not in respiratory distress Abdomen: not distended Neuro: Awake, interactive Psych: appropriately answers all questions  Assessment & Plan:   Assessment: Patient is a 89 year old male with a past medical history of upper GI bleed, duodenal ulcer, hypertension, metastatic prostate cancer to bone, chronic back pain, and recurrent falls who was admitted on 03/30/2024 after a fall at home.  Patient lived alone and normally ambulated with help of a walker.  Patient noted that he had a fall at home on the floor of the bathtub and could not get up for around 16 hours.  Since being hospitalized, patient has received management for rhabdomyolysis, electrolyte abnormalities, and elevated BNP.  Palliative medicine team consulted to assist with complex medical decision making.   Recommendations/Plan: # Complex medical decision making/goals of care:     - Disposition pending.                 -  Code Status: Limited: Do not attempt resuscitation (DNR) -DNR-LIMITED -Do Not Intubate/DNI    # Psycho-social/Spiritual Support:  - Support System: Niece-[name]   # Discharge Planning:  pending, TOC note reviewed, TOC  in contact with niece. Family appealing SNF denial.     Thank you for allowing the palliative care team to participate in the care Ellaree JINNY Saran.  low MDM Lonia Serve MD.  Palliative Care Provider PMT # [phone]  If patient remains symptomatic despite maximum doses, please call PMT at [phone] between 0700 and 1900. Outside of these hours, please call attending, as PMT does not have night coverage.

## 2024-04-15 NOTE — Progress Notes (Signed)
 Mobility Specialist Progress Note:   04/15/24 1505  Mobility  Activity Ambulated with assistance  Level of Assistance Contact guard assist, steadying assist  Assistive Device Front wheel walker  Distance Ambulated (ft) 270 ft  Activity Response Tolerated well  Mobility Referral Yes  Mobility visit 1 Mobility  Mobility Specialist Start Time (ACUTE ONLY) 1341  Mobility Specialist Stop Time (ACUTE ONLY) 1408  Mobility Specialist Time Calculation (min) (ACUTE ONLY) 27 min   Pt was received in bed and agreed to mobility. Pt stated slight weakness in legs due to being in bed. Returned to recliner with all needs met. Call bell in reach.  [employer] - Mobility Specialist

## 2024-04-15 NOTE — Progress Notes (Signed)
 [facility] Progress Note Patient: Alexander Rich FMW:[id number] DOB: March 03, 1926  DOA: 03/30/2024 DOS: the patient was seen and examined on 04/15/2024  Brief Hospital Course: Patient with PMH of  duodenal ulcer, upper GI bleed, hypertension, prostate cancer metastatic to the bone who presented to the hospital for recurrent falls and generalized weakness.  The patient fell at home in the bathtub and could not get up for about 16 hours. Treated for nontraumatic rhabdomyolysis with IV fluid.   Assessment and Plan: Nontraumatic rhabdomyolysis Unwitnessed fall. Presents with an unwitnessed fall. Significant weakness unable to get up on his own.  Strength improving now. CK was elevated minimally Treated with IV fluid.   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   Urinary retention Required Foley catheter upon admission. Continue Flomax  foley removed 12/4, now able to void.   Hypertension Continue losartan    Chronic thrombocytopenia   Generalized weakness PT OT consulted.  Awaiting placement.   Subjective: No nausea no vomiting no fever no chills.  No chest pain.  Physical Exam: Alert awake and oriented x 3. No edema. Clear to auscultation.  Data Reviewed: I have Reviewed nursing notes, Vitals, and Lab results.  Disposition: Status is: Inpatient Remains inpatient appropriate because: Remains medically stable.  Awaiting placement.  enoxaparin  (LOVENOX ) injection 40 mg Start: 03/30/24 2200   Family Communication: No one at bedside Level of care: Med-Surg   Vitals:   04/14/24 2001 04/15/24 0443 04/15/24 0941 04/15/24 1443  BP:  (!) 168/76 111/63 (!) 109/55  Pulse: 68 72 (!) 59 92  Resp:  20  16  Temp:  99.4 F (37.4 C)  (!) 97.4 F (36.3 C)  TempSrc:  Oral  Oral  SpO2: 98% 97%  98%  Weight:      Height:         Author: Yetta Blanch, MD 04/15/2024 5:01 PM  Please look on [email or web address] to find out who is on call.

## 2024-04-15 NOTE — Plan of Care (Signed)
  Problem: Clinical Measurements: Goal: Cardiovascular complication will be avoided Outcome: Progressing   Problem: Education: Goal: Knowledge of General Education information will improve Description: Including pain rating scale, medication(s)/side effects and non-pharmacologic comfort measures Outcome: Not Progressing   Problem: Health Behavior/Discharge Planning: Goal: Ability to manage health-related needs will improve Outcome: Not Progressing   Problem: Clinical Measurements: Goal: Ability to maintain clinical measurements within normal limits will improve Outcome: Not Progressing Goal: Will remain free from infection Outcome: Not Progressing Goal: Diagnostic test results will improve Outcome: Not Progressing Goal: Respiratory complications will improve Outcome: Not Progressing

## 2024-04-16 LAB — CBC
HCT: 34.2 % — ABNORMAL LOW (ref 39.0–52.0)
Hemoglobin: 11.1 g/dL — ABNORMAL LOW (ref 13.0–17.0)
MCH: 32.6 pg (ref 26.0–34.0)
MCHC: 32.5 g/dL (ref 30.0–36.0)
MCV: 100.3 fL — ABNORMAL HIGH (ref 80.0–100.0)
Platelets: 256 K/uL (ref 150–400)
RBC: 3.41 MIL/uL — ABNORMAL LOW (ref 4.22–5.81)
RDW: 13.8 % (ref 11.5–15.5)
WBC: 7.2 K/uL (ref 4.0–10.5)
nRBC: 0 % (ref 0.0–0.2)

## 2024-04-16 LAB — BASIC METABOLIC PANEL WITH GFR
Anion gap: 7 (ref 5–15)
BUN: 13 mg/dL (ref 8–23)
CO2: 26 mmol/L (ref 22–32)
Calcium: 8.6 mg/dL — ABNORMAL LOW (ref 8.9–10.3)
Chloride: 105 mmol/L (ref 98–111)
Creatinine, Ser: 0.92 mg/dL (ref 0.61–1.24)
GFR, Estimated: >60 mL/min (ref >60)
Glucose, Bld: 94 mg/dL (ref 70–99)
Potassium: 3.9 mmol/L (ref 3.5–5.1)
Sodium: 138 mmol/L (ref 135–145)

## 2024-04-16 LAB — MAGNESIUM: Magnesium: 2.5 mg/dL — ABNORMAL HIGH (ref 1.7–2.4)

## 2024-04-16 MED ORDER — LOSARTAN POTASSIUM 50 MG PO TABS: 100.0000 mg | ORAL_TABLET | Freq: Every evening | ORAL | Status: DC

## 2024-04-16 NOTE — Progress Notes (Signed)
 [facility] Progress Note Patient: Alexander Rich FMW:[id number] DOB: 1925-08-08  DOA: 03/30/2024 DOS: the patient was seen and examined on 04/16/2024  Brief Hospital Course: Patient with PMH of  duodenal ulcer, upper GI bleed, hypertension, prostate cancer metastatic to the bone who presented to the hospital for recurrent falls and generalized weakness.  The patient fell at home in the bathtub and could not get up for about 16 hours. Treated for nontraumatic rhabdomyolysis with IV fluid.   Assessment and Plan: Nontraumatic rhabdomyolysis Unwitnessed fall. Significant weakness unable to get up on his own.  Strength improving now. CK was elevated minimally Treated with IV fluid.   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   Urinary retention Required Foley catheter upon admission. Continue Flomax  foley removed 12/4, now able to void.   Hypertension Continue losartan , blood pressure noted to be on the low side in the mornings so retimed for evening   Chronic thrombocytopenia   Generalized weakness PT OT consulted.  Awaiting placement.   Subjective: No nausea no vomiting no fever no chills.  No chest pain.  Says he hardly has any appetite, but has no other specific complaints.  Physical Exam: Alert awake and oriented x 3. No edema. Clear to auscultation.  Data Reviewed: I have Reviewed nursing notes, Vitals, and Lab results.  Disposition: Status is: Inpatient Remains inpatient appropriate because: Remains medically stable.  Awaiting placement.  enoxaparin  (LOVENOX ) injection 40 mg Start: 03/30/24 2200   Family Communication: No one at bedside Level of care: Med-Surg   Vitals:   04/15/24 1443 04/15/24 2107 04/16/24 0522 04/16/24 0915  BP: (!) 109/55 (!) 150/66 (!) 153/69 (!) 118/55  Pulse: 92 63 77 60  Resp: 16 15 16    Temp: (!) 97.4  F (36.3 C) 98.2 F (36.8 C) 98.8 F (37.1 C)   TempSrc: Oral Oral Oral   SpO2: 98% 99% 99%   Weight:      Height:         Author: Tyreak Reagle CHRISTELLA Gail, MD 04/16/2024 9:19 AM  Please look on [email or web address] to find out who is on call.

## 2024-04-16 NOTE — Progress Notes (Signed)
°  Daily Progress Note   Patient Name: Alexander Rich       Date: 04/16/2024 DOB: 02-Apr-1926  Age: 89 y.o. MRN#: [id number] Attending Physician: Zella Katha HERO, MD Primary Care Physician: Seabron Lenis, MD Admit Date: 03/30/2024 Length of Stay: 10 days  Reason for Consultation/Follow-up: Establishing goals of care  Subjective:   Reviewed EMR including recent documentation from hospitalist and TOC.   Presented to bedside to see patient.  Patient laying comfortably in bed.  Patient     Objective:   Vital Signs:  BP (!) 118/55   Pulse 60   Temp 98.8 F (37.1 C) (Oral)   Resp 16   Ht 5' 7 (1.702 m)   Wt 76.8 kg   SpO2 99%   BMI 26.52 kg/m   Physical Exam: General: NAD, alert, chronically ill-appearing, elderly Cardiovascular: RRR Respiratory: not in respiratory distress Abdomen: not distended Neuro: Awake, interactive Psych: appropriately answers all questions  Assessment & Plan:   Assessment: Patient is a 89 year old male with a past medical history of upper GI bleed, duodenal ulcer, hypertension, metastatic prostate cancer to bone, chronic back pain, and recurrent falls who was admitted on 03/30/2024 after a fall at home.  Patient lived alone and normally ambulated with help of a walker.  Patient noted that he had a fall at home on the floor of the bathtub and could not get up for around 16 hours.  Since being hospitalized, patient has received management for rhabdomyolysis, electrolyte abnormalities, and elevated BNP.  Palliative medicine team consulted to assist with complex medical decision making.   Recommendations/Plan: # Complex medical decision making/goals of care:     - Disposition pending.                 -  Code Status: Limited: Do not attempt resuscitation (DNR) -DNR-LIMITED -Do Not Intubate/DNI   Oxy IR PO PRN for pain.  # Psycho-social/Spiritual Support:  - Support System: Psychologist, [employer]   # Discharge Planning:  pending, TOC note reviewed, TOC  in contact with niece. Family appealing SNF denial.     Thank you for allowing the palliative care team to participate in the care Ellaree JINNY Saran.  low MDM Lonia Serve MD.  Palliative Care Provider PMT # [phone]  If patient remains symptomatic despite maximum doses, please call PMT at [phone] between 0700 and 1900. Outside of these hours, please call attending, as PMT does not have night coverage.

## 2024-04-16 NOTE — Plan of Care (Signed)

## 2024-04-16 NOTE — Plan of Care (Signed)

## 2024-04-17 ENCOUNTER — Other Ambulatory Visit: Payer: Self-pay

## 2024-04-17 MED ORDER — LOSARTAN POTASSIUM 50 MG PO TABS
100.0000 mg | ORAL_TABLET | Freq: Every day | ORAL | Status: DC
  Administered 2024-04-17 – 2024-04-21 (×5): 100 mg via ORAL
  Filled 2024-04-17 (×5): qty 2

## 2024-04-17 NOTE — Progress Notes (Signed)
 Physical Therapy Treatment Patient Details Name: Alexander Rich MRN: [id number] DOB: July 02, 1925 Today's Date: 04/17/2024   History of Present Illness 89 yo male admitted to hospital on 03/30/2024 due to 2 falls secondary to progressive weakness, pt found to have rhabdomyolysis. 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 Patient noted to be volume overloaded on 12/2 and IV Lasix  was given.    PT Comments  Pt AXO 3 pleasant and willing.  Max c/o feeling tired.  Assisted OOB required increased time and effort.  Assisted with amb to the bathroom without any AD as prior.  General transfer comment: Requires anywhere from Min to Mod Assist pending surface height.  Definite use of hands hands to asisst as well as rocking forward momentum.  Increased instability with turns and back steps as well as poor forward flexed posture.  Assisted on/off toilet.  Assisted with hygien as Pt was unable to self perform and maintain a safe standing balance.  HIGH FALL RISK VERY unsteady gait with heavy lean, support on furniture and doorway.  Poor flexed posture and shuffled steps.  Increasingly unsteady with turns and back steps in the bathroom.  Poor self corrections and delayed balance response.  HIGH FALL RISK.  Limited amb distance in hallway also due to Max c/o weakness/fatigue.   Prior to admit, Pt was living home alone, was IND amb withot any AD, IND ADL's.  LPT has rec Pt will need ST Rehab at SNF to address mobility and functional decline prior to safely returning home.    If plan is discharge home, recommend the following: A little help with walking and/or transfers;A little help with bathing/dressing/bathroom;Assistance with cooking/housework;Assist for transportation;Help with stairs or ramp for entrance   Can travel by private vehicle     Yes  Equipment Recommendations  None recommended by  PT    Recommendations for Other Services       Precautions / Restrictions Precautions Precautions: Fall Precaution/Restrictions Comments: fell Down in tub 16 hours Restrictions Weight Bearing Restrictions Per Provider Order: No     Mobility  Bed Mobility Overal bed mobility: Needs Assistance Bed Mobility: Supine to Sit     Supine to sit: Mod assist     General bed mobility comments: required increased assist for upper body during supine to EOB due to increased c/o weakness/fatigue and back pain.  Getting back into bed, required Max Assist to support B LE up.  Positioned to comfort.    Transfers Overall transfer level: Needs assistance Equipment used: Rolling walker (2 wheels) Transfers: Sit to/from Stand Sit to Stand: Min assist, Mod assist           General transfer comment: Requires anywhere from Min to Mod Assist pending surface height.  Definite use of hands hands to asisst as well as rocking forward momentum.  Increased instability with turns and back steps as well as poor forward flexed posture.  Assisted on/off toilet.  Assisted with hygien as Pt was unable to self perform and maintain a safe standing balance.  HIGH FALL RISK    Ambulation/Gait Ambulation/Gait assistance: Contact guard assist, Min assist Gait Distance (Feet): 55 Feet Assistive device: Rolling walker (2 wheels) Gait Pattern/deviations: Decreased step length - right, Decreased step length - left, Step-through pattern, Trunk flexed, Decreased stride length Gait velocity: decreased     General Gait Details: Once upright, Pt was able to amb a great  distance but required the use of a walker for balance/instability.  Prior he did NOT use.  His steps are slightly shuffled and his posture is poor.  Very forward flexed/Kyphotic.  Also slow moving with delayed balance coorection response with poor ability to self correct.  Increased instability with back steps to bed.  HIGH FALL RISK.   Stairs              Wheelchair Mobility     Tilt Bed    Modified Rankin (Stroke Patients Only)       Balance Overall balance assessment: Needs assistance           Standing balance-Leahy Scale: Poor                              Communication Communication Communication: No apparent difficulties  Cognition Arousal: Alert Behavior During Therapy: WFL for tasks assessed/performed   PT - Cognitive impairments: No apparent impairments                       PT - Cognition Comments: AxO x 3 sharp Man who lives home alone. Following commands: Intact      Cueing Cueing Techniques: Verbal cues  Exercises      General Comments        Pertinent Vitals/Pain Pain Assessment Pain Assessment: Faces Pain Location: back Pain Descriptors / Indicators: Grimacing, Guarding, Radiating Pain Intervention(s): Monitored during session, Repositioned    Home Living                          Prior Function            PT Goals (current goals can now be found in the care plan section) Progress towards PT goals: Progressing toward goals    Frequency    Min 2X/week      PT Plan      Co-evaluation              AM-PAC PT 6 Clicks Mobility   Outcome Measure  Help needed turning from your back to your side while in a flat bed without using bedrails?: A Lot Help needed moving from lying on your back to sitting on the side of a flat bed without using bedrails?: A Lot Help needed moving to and from a bed to a chair (including a wheelchair)?: A Lot Help needed standing up from a chair using your arms (e.g., wheelchair or bedside chair)?: A Lot Help needed to walk in hospital room?: A Lot Help needed climbing 3-5 steps with a railing? : Total 6 Click Score: 11    End of Session Equipment Utilized During Treatment: Gait belt Activity Tolerance: Patient limited by fatigue Patient left: in chair;with call bell/phone within reach;with chair alarm  set Nurse Communication: Mobility status PT Visit Diagnosis: Unsteadiness on feet (R26.81);Other abnormalities of gait and mobility (R26.89);Repeated falls (R29.6);Muscle weakness (generalized) (M62.81);Difficulty in walking, not elsewhere classified (R26.2)     Time: 8893-8866 PT Time Calculation (min) (ACUTE ONLY): 27 min  Charges:    $Gait Training: 8-22 mins $Therapeutic Activity: 8-22 mins PT General Charges $$ ACUTE PT VISIT: 1 Visit                     Katheryn Leap  PTA Acute  Rehabilitation Services Office M-F          [phone]

## 2024-04-17 NOTE — Progress Notes (Signed)
 [facility] Progress Note Patient: Alexander Rich FMW:[id number] DOB: 29-Dec-1925  DOA: 03/30/2024 DOS: the patient was seen and examined on 04/17/2024  Brief Hospital Course: Patient with PMH of  duodenal ulcer, upper GI bleed, hypertension, prostate cancer metastatic to the bone who presented to the hospital for recurrent falls and generalized weakness.  The patient fell at home in the bathtub and could not get up for about 16 hours. Treated for nontraumatic rhabdomyolysis with IV fluid.   Assessment and Plan: Nontraumatic rhabdomyolysis Unwitnessed fall. Significant weakness unable to get up on his own.  Strength improving now. CK was elevated minimally Treated with IV fluid.   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   Urinary retention Required Foley catheter upon admission. Continue Flomax  foley removed 12/4, now able to void.   Hypertension Continue losartan , blood pressure noted to be on the low side in the mornings so retimed for evening   Chronic thrombocytopenia   Generalized weakness PT/OT consulted.  Awaiting placement.   Subjective:  No nausea no vomiting no fever no chills.  No chest pain.  No acute events.  Physical Exam: Alert awake and oriented x 3. No edema. Clear to auscultation.  Data Reviewed: I have Reviewed nursing notes, Vitals, and Lab results.  Disposition: Status is: Inpatient Remains inpatient appropriate because: Remains medically stable, working with PT and awaiting placement.  enoxaparin  (LOVENOX ) injection 40 mg Start: 03/30/24 2200   Family Communication: No one at bedside Level of care: Med-Surg   Vitals:   04/16/24 1433 04/16/24 2013 04/17/24 0349 04/17/24 0852  BP: (!) 152/70 (!) 157/67 (!) 160/84 (!) 193/71  Pulse: 61 70 78 74  Resp: 18 18 18    Temp: 98.5 F (36.9 C) 98.3 F (36.8 C) 97.7 F  (36.5 C) 97.8 F (36.6 C)  TempSrc: Oral Oral Oral Oral  SpO2: 100% 97% 100% 95%  Weight:      Height:         Author: Tanielle Emigh CHRISTELLA Gail, MD 04/17/2024 10:43 AM  Please look on [email or web address] to find out who is on call.

## 2024-04-17 NOTE — Progress Notes (Signed)
 Nutrition Follow-up  INTERVENTION:   -Ensure Plus High Protein po TID, each supplement provides 350 kcal and 20 grams of protein.   -Multivitamin with minerals daily  -Continue feeding or meal set up assistance   NUTRITION DIAGNOSIS:   Inadequate oral intake related to poor appetite as evidenced by per patient/family report.  Ongoing.  GOAL:   Patient will meet greater than or equal to 90% of their needs  Progressing.  MONITOR:   PO intake, Supplement acceptance  ASSESSMENT:   89 year old male with a history of upper GI bleed, duodenal ulcer, hypertension, metastatic prostate cancer to bone, recurrent falls who presented with a fall from home,weakness.  Patient currently consuming 15-35% of meals. At times not eating meals at all d/t not being hungry. Drinking Ensure supplements.  Awaiting placement to SNF.   Medications: Vitamin B-12, Multivitamin with minerals daily, Miralax , Senokot, Prednisone   Labs reviewed: Elevated Mg   Diet Order:   Diet Order             Diet Heart Room service appropriate? Yes; Fluid consistency: Thin  Diet effective now                   EDUCATION NEEDS:   Not appropriate for education at this time  Skin:  Skin Assessment: Skin Integrity Issues: Skin Integrity Issues:: Other (Comment) Other: abrasions on arms, right wrist  Last BM:  12/14  Height:   Ht Readings from Last 1 Encounters:  03/30/24 5' 7 (1.702 m)    Weight:   Wt Readings from Last 1 Encounters:  03/30/24 76.8 kg    BMI:  Body mass index is 26.52 kg/m.  Estimated Nutritional Needs:   Kcal:  1600-1800  Protein:  75-85g  Fluid:  1.6L/day   Morna Lee, MS, RD, LDN Inpatient Clinical Dietitian Contact via Secure chat

## 2024-04-17 NOTE — TOC Progression Note (Signed)
 Transition of Care [facility]) - Progression Note    Patient Details  Name: Alexander Rich MRN: [id number] Date of Birth: 1926-01-10  Transition of Care [facility]) CM/SW Contact  Toy LITTIE Agar, RN Phone [phone]  04/17/2024, 3:31 PM  Clinical Narrative:    CM called [employer] [phone]) for status update on appeal. Appeal was received on 12/10 and is still pending. Decision will be determined within 30 days of receiving request.    Expected Discharge Plan: Skilled Nursing Facility Barriers to Discharge: Continued Medical Work up               Expected Discharge Plan and Services In-house Referral: NA Discharge Planning Services: CM Consult Post Acute Care Choice: Skilled Nursing Facility Living arrangements for the past 2 months: Single Family Home Expected Discharge Date: 04/11/24               DME Arranged: N/A DME Agency: NA       HH Arranged: NA HH Agency: NA         Social Drivers of Health (SDOH) Interventions SDOH Screenings   Food Insecurity: No Food Insecurity (03/30/2024)  Housing: Low Risk (03/30/2024)  Transportation Needs: No Transportation Needs (03/30/2024)  Utilities: Not At Risk (03/30/2024)  Depression (PHQ2-9): Low Risk (01/05/2024)  Social Connections: Moderately Integrated (03/30/2024)  Tobacco Use: Low Risk (03/30/2024)    Readmission Risk Interventions    11/04/2022    1:30 PM  Readmission Risk Prevention Plan  Transportation Screening Complete  PCP or Specialist Appt within 3-5 Days Complete  HRI or Home Care Consult Complete  Social Work Consult for Recovery Care Planning/Counseling Complete  Palliative Care Screening Not Applicable  Medication Review Oceanographer) Complete

## 2024-04-18 ENCOUNTER — Other Ambulatory Visit: Payer: Self-pay

## 2024-04-18 NOTE — Plan of Care (Signed)

## 2024-04-18 NOTE — TOC Progression Note (Signed)
 Transition of Care [facility]) - Progression Note    Patient Details  Name: Alexander Rich MRN: [id number] Date of Birth: 07-08-1925  Transition of Care [facility]) CM/SW Contact  Toy LITTIE Agar, RN Phone Number:[phone]  04/18/2024, 3:16 PM  Clinical Narrative:    CM has reached out to inpatient care manager supervisor for guidance for next steps with this case since insurance appeal is still pending. Barrier is patient with weakness and falls from home alone. CM to reach out to PT for daily PT visits.   1600 Inpatient care manager supervisor Angie Lesches provides update that [employer] has not submitted HCPOA paperwork  which has caused a delay in processing appeal. CM to reach out to niece  [name].    Expected Discharge Plan: Skilled Nursing Facility Barriers to Discharge: Continued Medical Work up               Expected Discharge Plan and Services In-house Referral: NA Discharge Planning Services: CM Consult Post Acute Care Choice: Skilled Nursing Facility Living arrangements for the past 2 months: Single Family Home Expected Discharge Date: 04/11/24               DME Arranged: N/A DME Agency: NA       HH Arranged: NA HH Agency: NA         Social Drivers of Health (SDOH) Interventions SDOH Screenings   Food Insecurity: No Food Insecurity (03/30/2024)  Housing: Low Risk (03/30/2024)  Transportation Needs: No Transportation Needs (03/30/2024)  Utilities: Not At Risk (03/30/2024)  Depression (PHQ2-9): Low Risk (01/05/2024)  Social Connections: Moderately Integrated (03/30/2024)  Tobacco Use: Low Risk (03/30/2024)    Readmission Risk Interventions    11/04/2022    1:30 PM  Readmission Risk Prevention Plan  Transportation Screening Complete  PCP or Specialist Appt within 3-5 Days Complete  HRI or Home Care Consult Complete  Social Work Consult for Recovery Care Planning/Counseling Complete  Palliative Care Screening Not Applicable  Medication Review Special Educational Needs Teacher) Complete

## 2024-04-18 NOTE — Progress Notes (Signed)
 Mobility Specialist Progress Note:  Post-mobility: 98% SPO2   04/18/24 1641  Mobility  Activity Ambulated with assistance  Level of Assistance Minimal assist, patient does 75% or more  Assistive Device Front wheel walker  Distance Ambulated (ft) 190 ft  Activity Response Tolerated well  Mobility Referral Yes  Mobility visit 1 Mobility  Mobility Specialist Start Time (ACUTE ONLY) 1543  Mobility Specialist Stop Time (ACUTE ONLY) 1553  Mobility Specialist Time Calculation (min) (ACUTE ONLY) 10 min   Pt was received in bed and agreed to mobility. Pt stated feeling fatigued towards the end of session. 1x minute sitting rest break at the edge of bed upon returning to room. Returned to bed with all needs met. Call bell in reach. RN notified.   [employer] - Mobility Specialist

## 2024-04-18 NOTE — Progress Notes (Signed)
 [facility] Progress Note Patient: Alexander Rich FMW:[id number] DOB: [birth year]  DOA: 03/30/2024 DOS: the patient was seen and examined on 04/18/2024  Brief Hospital Course: Patient with PMH of  duodenal ulcer, upper GI bleed, hypertension, prostate cancer metastatic to the bone who presented to the hospital for recurrent falls and generalized weakness.  The patient fell at home in the bathtub and could not get up for about 16 hours. Treated for nontraumatic rhabdomyolysis with IV fluid.  Now medically stable, awaiting insurance authorization for subacute nursing facility, appeal is pending.   Assessment and Plan: Nontraumatic rhabdomyolysis Unwitnessed fall. Significant weakness unable to get up on his own.  Strength improving now. CK was elevated minimally Treated with IV fluid.   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   Urinary retention Required Foley catheter upon admission. Continue Flomax  foley removed 12/4, now able to void.   Hypertension Continue losartan , blood pressure noted to be on the low side in the mornings so retimed for evening   Chronic thrombocytopenia   Generalized weakness PT/OT consulted.  Awaiting placement.   Subjective:  No nausea no vomiting no fever no chills.  No chest pain.  No acute events. Expresses frustration at still being in the hospital, and nobody in his family will bring him his reading glasses.  Physical Exam: Alert awake and oriented x 3.  His nephew was at the bedside this morning. No edema. Clear to auscultation.  Data Reviewed: I have Reviewed nursing notes, Vitals, and Lab results.  Disposition: Status is: Inpatient Remains inpatient appropriate because: Remains medically stable, working with PT and awaiting placement.  enoxaparin  (LOVENOX ) injection 40 mg Start: 03/30/24 2200   Family  Communication: No one at bedside Level of care: Med-Surg   Vitals:   04/17/24 2059 04/18/24 0602 04/18/24 0828 04/18/24 1331  BP: (!) 163/89 (!) 149/74 (!) 150/58 105/81  Pulse: 73 62 61 64  Resp: 18 18 16 20   Temp:  97.6 F (36.4 C) 97.6 F (36.4 C) 98.6 F (37 C)  TempSrc:  Oral Oral Oral  SpO2: 99% 100% 95% 99%  Weight:      Height:         Author: Daphnie Venturini CHRISTELLA Gail, MD 04/18/2024 1:52 PM  Please look on [email or web address] to find out who is on call.

## 2024-04-19 ENCOUNTER — Other Ambulatory Visit: Payer: Self-pay

## 2024-04-19 DIAGNOSIS — M6282 Rhabdomyolysis: Secondary | ICD-10-CM | POA: Diagnosis not present

## 2024-04-19 DIAGNOSIS — R338 Other retention of urine: Secondary | ICD-10-CM | POA: Diagnosis present

## 2024-04-19 NOTE — Assessment & Plan Note (Signed)
 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

## 2024-04-19 NOTE — Assessment & Plan Note (Signed)
 Nutrition Problem: Inadequate oral intake Etiology: poor appetite Signs/Symptoms: per patient/family report Interventions: Ensure Enlive (each supplement provides 350kcal and 20 grams of protein), MVI

## 2024-04-19 NOTE — Assessment & Plan Note (Signed)
 PT/OT consulted and recommended STR Insurance reviewed and denied, even after peer to peer Family has appealed He may need to dc with family if unable to arrange for placement

## 2024-04-19 NOTE — Progress Notes (Signed)
 Physical Therapy Treatment Patient Details Name: Alexander Rich MRN: [id number] DOB: [birth year] Today's Date: 04/19/2024   History of Present Illness 89 yo male admitted to hospital on 03/30/2024 due to 2 falls secondary to progressive weakness, pt found to have rhabdomyolysis. 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 Patient noted to be volume overloaded on 12/2 and IV Lasix  was given.    PT Comments  Pt agreeable to working with therapy. Min A for mobility on today. He has concerns about length of stay that he has had in the hospital. He participated well on today. Assisted pt back to bed to rest at his request. Will continue to follow. Patient will benefit from continued inpatient follow up therapy, <3 hours/day     If plan is discharge home, recommend the following: A little help with walking and/or transfers;A little help with bathing/dressing/bathroom;Assistance with cooking/housework;Assist for transportation;Help with stairs or ramp for entrance   Can travel by private vehicle     Yes  Equipment Recommendations  None recommended by PT    Recommendations for Other Services       Precautions / Restrictions Precautions Precautions: Fall Restrictions Weight Bearing Restrictions Per Provider Order: No     Mobility  Bed Mobility Overal bed mobility: Needs Assistance Bed Mobility: Supine to Sit     Supine to sit: Min assist Sit to supine: Contact guard assist   General bed mobility comments: Assist for trunk to upright. Increased time and effort. Cues for technique.    Transfers Overall transfer level: Needs assistance Equipment used: Rolling walker (2 wheels) Transfers: Sit to/from Stand Sit to Stand: Contact guard assist           General transfer comment: Cues for safety, technique, hand placement. Increased time and effort for pt.     Ambulation/Gait Ambulation/Gait assistance: Contact guard assist Gait Distance (Feet): 115 Feet Assistive device: Rolling walker (2 wheels) Gait Pattern/deviations: Step-through pattern, Decreased stride length       General Gait Details: Fair gait speed. No LOB with RW use on today. Tolerated distance fairly well. Cues for safety, proper operation of RW   Stairs             Wheelchair Mobility     Tilt Bed    Modified Rankin (Stroke Patients Only)       Balance Overall balance assessment: Needs assistance         Standing balance support: Bilateral upper extremity supported, During functional activity, Reliant on assistive device for balance Standing balance-Leahy Scale: Fair                              Hotel Manager: No apparent difficulties  Cognition Arousal: Alert Behavior During Therapy: WFL for tasks assessed/performed   PT - Cognitive impairments: No apparent impairments                         Following commands: Intact      Cueing Cueing Techniques: Verbal cues  Exercises Other Exercises Other Exercises: Sit to stand x 3 reps, CGA    General Comments        Pertinent Vitals/Pain Pain Assessment Pain Assessment: Faces Faces Pain Scale: Hurts little more Pain Location: back Pain Descriptors / Indicators: Discomfort, Aching Pain Intervention(s): Monitored during session, Repositioned  Home Living                          Prior Function            PT Goals (current goals can now be found in the care plan section) Progress towards PT goals: Progressing toward goals    Frequency    Min 2X/week      PT Plan      Co-evaluation              AM-PAC PT 6 Clicks Mobility   Outcome Measure  Help needed turning from your back to your side while in a flat bed without using bedrails?: A Little Help needed moving from lying on your back to sitting on the  side of a flat bed without using bedrails?: A Little Help needed moving to and from a bed to a chair (including a wheelchair)?: A Little Help needed standing up from a chair using your arms (e.g., wheelchair or bedside chair)?: A Little Help needed to walk in hospital room?: A Little Help needed climbing 3-5 steps with a railing? : A Lot 6 Click Score: 17    End of Session Equipment Utilized During Treatment: Gait belt Activity Tolerance: Patient tolerated treatment well;Patient limited by fatigue Patient left: in bed;with call bell/phone within reach;with bed alarm set   PT Visit Diagnosis: Unsteadiness on feet (R26.81);Other abnormalities of gait and mobility (R26.89);Repeated falls (R29.6);Muscle weakness (generalized) (M62.81);Difficulty in walking, not elsewhere classified (R26.2)     Time: 8494-8480 PT Time Calculation (min) (ACUTE ONLY): 14 min  Charges:    $Gait Training: 8-22 mins PT General Charges $$ ACUTE PT VISIT: 1 Visit                         Dannial SQUIBB, PT Acute Rehabilitation  Office: [phone]

## 2024-04-19 NOTE — Plan of Care (Signed)
  Problem: Clinical Measurements: Goal: Ability to maintain clinical measurements within normal limits will improve Outcome: Progressing Goal: Will remain free from infection Outcome: Progressing Goal: Diagnostic test results will improve Outcome: Progressing   Problem: Activity: Goal: Risk for activity intolerance will decrease Outcome: Progressing   Problem: Elimination: Goal: Will not experience complications related to bowel motility Outcome: Progressing Goal: Will not experience complications related to urinary retention Outcome: Progressing   Problem: Pain Managment: Goal: General experience of comfort will improve and/or be controlled Outcome: Progressing   Problem: Safety: Goal: Ability to remain free from injury will improve Outcome: Progressing   Problem: Skin Integrity: Goal: Risk for impaired skin integrity will decrease Outcome: Progressing

## 2024-04-19 NOTE — Assessment & Plan Note (Deleted)
 Unwitnessed fall. Presents with an unwitnessed fall. Significant weakness unable to get up on his own.  Strength improving now. CK was elevated minimally Treated with IV fluid.

## 2024-04-19 NOTE — Assessment & Plan Note (Signed)
 DNR confirmed at the time of admission Patient will need a gold out of facility DNR form at the time of discharge

## 2024-04-19 NOTE — Assessment & Plan Note (Deleted)
 PT OT consulted.  Awaiting placement.

## 2024-04-19 NOTE — Assessment & Plan Note (Deleted)
 Continue losartan 

## 2024-04-19 NOTE — Assessment & Plan Note (Signed)
 Presented with an unwitnessed fall Significant weakness unable to get up on his own Strength improving CK was elevated minimally Treated with IV fluid This issue is clinically resolved

## 2024-04-19 NOTE — Plan of Care (Signed)
°  Problem: Clinical Measurements: Goal: Ability to maintain clinical measurements within normal limits will improve Outcome: Progressing Goal: Will remain free from infection Outcome: Progressing Goal: Cardiovascular complication will be avoided Outcome: Progressing   Problem: Activity: Goal: Risk for activity intolerance will decrease Outcome: Progressing   Problem: Nutrition: Goal: Adequate nutrition will be maintained Outcome: Progressing   Problem: Coping: Goal: Level of anxiety will decrease Outcome: Progressing   Problem: Elimination: Goal: Will not experience complications related to bowel motility Outcome: Progressing   Problem: Safety: Goal: Ability to remain free from injury will improve Outcome: Progressing   Problem: Skin Integrity: Goal: Risk for impaired skin integrity will decrease Outcome: Progressing

## 2024-04-19 NOTE — Assessment & Plan Note (Signed)
 Required foley catheter on presentation Continue tamsulosin  Passed voiding trial

## 2024-04-19 NOTE — Assessment & Plan Note (Deleted)
 DNR confirmed at the time of admission Patient will need a gold out of facility DNR form at the time of discharge

## 2024-04-19 NOTE — Assessment & Plan Note (Deleted)
 Required Foley catheter upon admission. Continue Flomax  foley removed 12/4, now able to void.

## 2024-04-19 NOTE — Assessment & Plan Note (Signed)
 Continue losartan 

## 2024-04-19 NOTE — Progress Notes (Signed)
 Progress Note   Patient: Alexander Rich FMW:[id number] DOB: [birth year] DOA: 03/30/2024     13 DOS: the patient was seen and examined on 04/19/2024   Brief hospital course: 89yo with h/o UGI bleeding due to duodenal ulcer, HTN, and prostate cancer with bony metastasis who presented on 11/27 with recurrent falls and resultant nontraumatic rhabdomyolysis.  PT/OT consulted and he was recommended for STR in SNF but insurance denied after peer to peer.  Family is appealing this decision.   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- continue Oxycodone  as needed for his back pain Acute urinary retention Required foley catheter on presentation Continue tamsulosin  Passed voiding trial Essential hypertension Continue losartan  Inadequate oral intake Nutrition Problem: Inadequate oral intake Etiology: poor appetite Signs/Symptoms: per patient/family report Interventions: Ensure Enlive (each supplement provides 350kcal and 20 grams of protein), MVI DNR (do not resuscitate) DNR confirmed at the time of admission Patient will need a gold out of facility DNR form at the time of discharge      Consultants: Palliative care PT Inpatient case management   Procedures: None   Antibiotics: None  30 Day Unplanned Readmission Risk Score    Flowsheet Row ED to Hosp-Admission (Current) from 03/30/2024 in [place] 6 EAST ONCOLOGY  30 Day Unplanned Readmission Risk Score (%) 22.16 Filed at 04/19/2024 1600    This score is  the patient's risk of an unplanned readmission within 30 days of being discharged (0 -100%). The score is based on dignosis, age, lab data, medications, orders, and past utilization.   Low:  0-14.9   Medium: 15-21.9   High: 22-29.9   Extreme: 30 and above           Subjective: He is sad, does not have a significant will to continue living.  No family at home.   Objective: Vitals:   04/19/24 0623 04/19/24 1343  BP: 110/67 125/70  Pulse: 80 61  Resp: 18 (!) 22  Temp: 98.5 F (36.9 C) 98 F (36.7 C)  SpO2: 96% 98%    Intake/Output Summary (Last 24 hours) at 04/19/2024 1938 Last data filed at 04/19/2024 1900 Gross per 24 hour  Intake 360 ml  Output 200 ml  Net 160 ml   Filed Weights   03/30/24 1823  Weight: 76.8 kg    Exam:  General:  Appears calm and comfortable and is in NAD Eyes:  normal lids, iris; marked ectropion ENT:  grossly normal hearing, lips & tongue, mmm Cardiovascular:  RRR. No LE edema.  Respiratory:   CTA bilaterally with no wheezes/rales/rhonchi.  Normal respiratory effort. Abdomen:  soft, NT, ND Skin:  no rash or induration seen on limited exam Musculoskeletal:  grossly normal tone BUE/BLE, good ROM, no bony abnormality Psychiatric:  blunted mood and affect, speech fluent and appropriate, AOx3 Neurologic:  CN 2-12 grossly intact, moves all extremities in coordinated fashion  Data Reviewed: I have reviewed the patient's lab results since admission.  Pertinent labs for today include:   None today     Family Communication: None present  Mobility: PT/OT Consulted  and are recommending - Skilled Nursing-Short Term Rehab (<3 Hours/Day)04/19/2024 1710    Code Status: Limited: Do not attempt resuscitation (DNR) -DNR-LIMITED -Do Not Intubate/DNI     Disposition: Status is: Inpatient Remains inpatient appropriate because: awaiting disposition     Time spent: 50 minutes  Unresulted Labs (From admission, onward)    None         Author: Delon Herald, MD 04/19/2024 7:38 PM  For on call review [email or web address].

## 2024-04-19 NOTE — TOC Progression Note (Addendum)
 Transition of Care [facility]) - Progression Note    Patient Details  Name: [name] MRN: [id number] Date of Birth: 05-Sep-1925  [facility]) CM/SW Contact  Toy LITTIE Agar, RN Phone Number:[phone]  04/19/2024, 11:42 AM  Clinical Narrative:    CM spoke with niece [name] in reference to [employer] paperwork to insurance for appeal. Per Clara paperwork was faxed last week. Clara states that she will fax this information again. Cm has explained that therapy is working with patient daily and that plan will be to discharge home. CM will follow up with therapy.   1338 [name] Skye called to update CM to make aware that HCPOA forms have been faxed with confirmation received.   Expected Discharge Plan: Skilled Nursing Facility Barriers to Discharge: Continued Medical Work up               Expected Discharge Plan and Services In-house Referral: NA Discharge Planning Services: CM Consult Post Acute Care Choice: Skilled Nursing Facility Living arrangements for the past 2 months: Single Family Home Expected Discharge Date: 04/11/24               DME Arranged: N/A DME Agency: NA       HH Arranged: NA HH Agency: NA         Social Drivers of Health (SDOH) Interventions SDOH Screenings   Food Insecurity: No Food Insecurity (03/30/2024)  Housing: Low Risk (03/30/2024)  Transportation Needs: No Transportation Needs (03/30/2024)  Utilities: Not At Risk (03/30/2024)  Depression (PHQ2-9): Low Risk (01/05/2024)  Social Connections: Moderately Integrated (03/30/2024)  Tobacco Use: Low Risk (03/30/2024)    Readmission Risk Interventions    11/04/2022    1:30 PM  Readmission Risk Prevention Plan  Transportation Screening Complete  PCP or Specialist Appt within 3-5 Days Complete  HRI or Home Care Consult Complete  Social Work Consult for Recovery Care Planning/Counseling Complete  Palliative Care Screening Not Applicable  Medication Review Furniture Conservator/restorer) Complete

## 2024-04-19 NOTE — Assessment & Plan Note (Deleted)
 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

## 2024-04-20 ENCOUNTER — Other Ambulatory Visit: Payer: Self-pay

## 2024-04-20 DIAGNOSIS — M6282 Rhabdomyolysis: Secondary | ICD-10-CM | POA: Diagnosis not present

## 2024-04-20 NOTE — Assessment & Plan Note (Deleted)
 PT/OT consulted and recommended STR Insurance reviewed and denied, even after peer to peer Family has appealed He may need to dc with family if unable to arrange for placement

## 2024-04-20 NOTE — Assessment & Plan Note (Signed)
 Continue losartan 

## 2024-04-20 NOTE — Assessment & Plan Note (Signed)
 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

## 2024-04-20 NOTE — Assessment & Plan Note (Signed)
 DNR confirmed at the time of admission Patient will need a gold out of facility DNR form at the time of discharge

## 2024-04-20 NOTE — Assessment & Plan Note (Deleted)
 Required foley catheter on presentation Continue tamsulosin  Passed voiding trial

## 2024-04-20 NOTE — Assessment & Plan Note (Deleted)
 Presented with an unwitnessed fall Significant weakness unable to get up on his own Strength improving CK was elevated minimally Treated with IV fluid This issue is clinically resolved

## 2024-04-20 NOTE — Assessment & Plan Note (Signed)
 Nutrition Problem: Inadequate oral intake Etiology: poor appetite Signs/Symptoms: per patient/family report Interventions: Ensure Enlive (each supplement provides 350kcal and 20 grams of protein), MVI

## 2024-04-20 NOTE — Assessment & Plan Note (Deleted)
 Continue losartan 

## 2024-04-20 NOTE — Assessment & Plan Note (Signed)
 PT/OT consulted and recommended STR Insurance reviewed and denied, even after peer to peer Family has appealed He may need to dc with family if unable to arrange for placement

## 2024-04-20 NOTE — Assessment & Plan Note (Signed)
 Required foley catheter on presentation Continue tamsulosin  Passed voiding trial

## 2024-04-20 NOTE — Assessment & Plan Note (Deleted)
 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

## 2024-04-20 NOTE — Assessment & Plan Note (Signed)
 Presented with an unwitnessed fall Significant weakness unable to get up on his own Strength improving CK was elevated minimally Treated with IV fluid This issue is clinically resolved

## 2024-04-20 NOTE — Progress Notes (Signed)
 Progress Note   Patient: Alexander Rich FMW:[id number] DOB: 1925-06-24 DOA: 03/30/2024     14 DOS: the patient was seen and examined on 04/20/2024   Brief hospital course: 89yo with h/o UGI bleeding due to duodenal ulcer, HTN, and prostate cancer with bony metastasis who presented on 11/27 with recurrent falls and resultant nontraumatic rhabdomyolysis.  PT/OT consulted and he was recommended for STR in SNF but insurance denied after peer to peer.  Family is appealing this decision.    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- continue Oxycodone  as needed for his back pain Acute urinary retention Required foley catheter on presentation Continue tamsulosin  Passed voiding trial Essential hypertension Continue losartan  Inadequate oral intake Nutrition Problem: Inadequate oral intake Etiology: poor appetite Signs/Symptoms: per patient/family report Interventions: Ensure Enlive (each supplement provides 350kcal and 20 grams of protein), MVI DNR (do not resuscitate) DNR confirmed at the time of admission Patient will need a gold out of facility DNR form at the time of discharge       Consultants: Palliative care PT Inpatient case management   Procedures: None   Antibiotics: None  30 Day Unplanned Readmission Risk Score    Flowsheet Row ED to Hosp-Admission (Current) from 03/30/2024 in [place] 6 EAST ONCOLOGY  30 Day Unplanned Readmission Risk Score (%) 22.47 Filed at 04/20/2024 1600    This score  is the patient's risk of an unplanned readmission within 30 days of being discharged (0 -100%). The score is based on dignosis, age, lab data, medications, orders, and past utilization.   Low:  0-14.9   Medium: 15-21.9   High: 22-29.9   Extreme: 30 and above           Subjective: Feeling ok, concerned about living alone.  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  I spoke with his niece.  He has not finalized his paperwork, was in the [place] for 4 years and went to [facility].  His niece is 89yo and really can't help him much.  Family is not an option so he would have to go home by himself.  He cannot afford a caregiver at home.   Objective: Vitals:   04/20/24 0934 04/20/24 1437  BP:  (!) 145/72  Pulse: 80 76  Resp:  18  Temp:  98.8 F (37.1 C)  SpO2: 99% 97%    Intake/Output Summary (Last 24 hours) at 04/20/2024 1722 Last data filed at 04/19/2024 1900 Gross per 24 hour  Intake 240 ml  Output --  Net 240 ml   Filed Weights   03/30/24 1823  Weight: 76.8 kg    Exam:  General:  Appears calm and comfortable and is in NAD, up in chair Eyes:  normal lids, iris; + ectropion ENT:  grossly normal hearing, lips & tongue, mmm Cardiovascular:  RRR. No LE edema.  Respiratory:   CTA bilaterally with no wheezes/rales/rhonchi.  Normal respiratory effort. Abdomen:  soft, NT, ND Skin:  no rash or induration seen on limited exam Musculoskeletal:  grossly normal tone  BUE/BLE, good ROM, no bony abnormality Psychiatric:  blunted mood and affect, speech fluent and appropriate, AOx3 Neurologic:  CN 2-12 grossly intact, moves all extremities in coordinated fashion  Data Reviewed: I have reviewed the patient's lab results since admission.  Pertinent labs for today include:   None     Family Communication: None present; I spoke with his niece by telephone  Mobility: PT/OT Consulted and are recommending - Skilled Nursing-Short Term Rehab (<3  Hours/Day)04/19/2024 1710    Code Status: Limited: Do not attempt resuscitation (DNR) -DNR-LIMITED -Do Not Intubate/DNI    Disposition: Status is: Inpatient Remains inpatient appropriate because: needs placement     Time spent: 35 minutes  Unresulted Labs (From admission, onward)     Start     Ordered   04/21/24 0500  CBC  Tomorrow morning,   R       Question:  Specimen collection method  Answer:  Lab=Lab collect   04/20/24 1722   04/21/24 0500  Basic metabolic panel with GFR  Tomorrow morning,   R       Question:  Specimen collection method  Answer:  Lab=Lab collect   04/20/24 1722             Author: Delon Herald, MD 04/20/2024 5:22 PM  For on call review [email or web address].

## 2024-04-20 NOTE — Plan of Care (Signed)

## 2024-04-20 NOTE — Plan of Care (Signed)
  Problem: Nutrition: Goal: Adequate nutrition will be maintained Outcome: Progressing   Problem: Coping: Goal: Level of anxiety will decrease Outcome: Progressing   Problem: Elimination: Goal: Will not experience complications related to bowel motility Outcome: Progressing Goal: Will not experience complications related to urinary retention Outcome: Progressing   Problem: Pain Managment: Goal: General experience of comfort will improve and/or be controlled Outcome: Progressing

## 2024-04-20 NOTE — Progress Notes (Signed)
 Mobility Specialist Progress Note:   04/20/24 1235  Mobility  Activity Pivoted/transferred from chair to bed  Level of Assistance Moderate assist, patient does 50-74%  Assistive Device Front wheel walker  Distance Ambulated (ft) 2 ft  Activity Response Tolerated fair  Mobility Referral Yes  Mobility visit 1 Mobility  Mobility Specialist Start Time (ACUTE ONLY) 1115  Mobility Specialist Stop Time (ACUTE ONLY) 1125  Mobility Specialist Time Calculation (min) (ACUTE ONLY) 10 min   Pt was received in recliner and requested to get back in bed. Pt stated not feeling well. Mod A sit to stand. Returned to bed with all needs met. Call bell in reach and bed alarm on.   [employer] - Mobility Specialist

## 2024-04-20 NOTE — Assessment & Plan Note (Deleted)
 Nutrition Problem: Inadequate oral intake Etiology: poor appetite Signs/Symptoms: per patient/family report Interventions: Ensure Enlive (each supplement provides 350kcal and 20 grams of protein), MVI

## 2024-04-20 NOTE — Assessment & Plan Note (Deleted)
 DNR confirmed at the time of admission Patient will need a gold out of facility DNR form at the time of discharge

## 2024-04-21 ENCOUNTER — Other Ambulatory Visit (HOSPITAL_COMMUNITY): Payer: Self-pay

## 2024-04-21 DIAGNOSIS — M6282 Rhabdomyolysis: Secondary | ICD-10-CM | POA: Diagnosis not present

## 2024-04-21 LAB — CBC
HCT: 35.8 % — ABNORMAL LOW (ref 39.0–52.0)
Hemoglobin: 11.4 g/dL — ABNORMAL LOW (ref 13.0–17.0)
MCH: 32.2 pg (ref 26.0–34.0)
MCHC: 31.8 g/dL (ref 30.0–36.0)
MCV: 101.1 fL — ABNORMAL HIGH (ref 80.0–100.0)
Platelets: 183 K/uL (ref 150–400)
RBC: 3.54 MIL/uL — ABNORMAL LOW (ref 4.22–5.81)
RDW: 14.1 % (ref 11.5–15.5)
WBC: 7.4 K/uL (ref 4.0–10.5)
nRBC: 0 % (ref 0.0–0.2)

## 2024-04-21 LAB — BASIC METABOLIC PANEL WITH GFR
Anion gap: 9 (ref 5–15)
BUN: 16 mg/dL (ref 8–23)
CO2: 25 mmol/L (ref 22–32)
Calcium: 8.8 mg/dL — ABNORMAL LOW (ref 8.9–10.3)
Chloride: 104 mmol/L (ref 98–111)
Creatinine, Ser: 1 mg/dL (ref 0.61–1.24)
GFR, Estimated: >60 mL/min
Glucose, Bld: 136 mg/dL — ABNORMAL HIGH (ref 70–99)
Potassium: 4.6 mmol/L (ref 3.5–5.1)
Sodium: 138 mmol/L (ref 135–145)

## 2024-04-21 MED ORDER — ENSURE PLUS HIGH PROTEIN PO LIQD
237.0000 mL | Freq: Three times a day (TID) | ORAL | 0 refills | Status: AC
  Filled 2024-04-21: qty 21330, 30d supply, fill #0

## 2024-04-21 NOTE — Assessment & Plan Note (Addendum)
 DNR confirmed at the time of admission Patient will need a gold out of facility DNR form at the time of discharge - signed and on his chart

## 2024-04-21 NOTE — Assessment & Plan Note (Addendum)
 Continue losartan 

## 2024-04-21 NOTE — TOC Progression Note (Addendum)
 Transition of Care [facility]) - Progression Note    Patient Details  Name: Alexander Rich MRN: [id number] Date of Birth: [birth year]  Transition of Care [facility]) CM/SW Contact  Toy LITTIE Agar, RN Phone Number:[phone]  04/21/2024, 9:48 AM  Clinical Narrative:    Case discussed with MD and inpatient care manager supervisor Delon Lesches. Inpatient care manager with limited options. We have discussed case and agree to follow through with VA benefit check and insurance appeal status. Per MD patient has served in [employer] and may have VA benefits. Thic CM has attempted to reach April transfer coordinator with VA to verify benefits. There is no answer. CM will await return call.   [id number] Insurance shara still in progress. No updates available at this time.   1019 CM spoke with April transfer coordinator at [facility] per April patient is non service connected. Patients must be 70% or greater services connection to be eligible for Long term care. For short term care the [place] will not cover the primary benefit must cover. Patient receives care with  [facility] . MD is Dr. Odessa Blanch, SW Dian Solian [phone]. VA shows primary insurance as Medicare part D & B. Patient has no VA benefits to cover SNF.  1100 CM at bedside to update patient to make aware that he would be discharging home. Patient called niece and asked CM to speak with nieces husband [name]. Per [name] the patient will be at home alone because [name] and his wife can not stay with him. [name] states that patient comes from home alone and that he has tried to get placement for patient before but after getting all paperwork completed the patient refused to go. [name] states that he would call the insurance company again. CM has informed [name] that the plan is for discharge.      Expected Discharge Plan: Skilled Nursing Facility Barriers to Discharge: Continued Medical Work up               Expected Discharge Plan and  Services In-house Referral: NA Discharge Planning Services: CM Consult Post Acute Care Choice: Skilled Nursing Facility Living arrangements for the past 2 months: Single Family Home Expected Discharge Date: 04/11/24               DME Arranged: N/A DME Agency: NA       HH Arranged: NA HH Agency: NA         Social Drivers of Health (SDOH) Interventions SDOH Screenings   Food Insecurity: No Food Insecurity (03/30/2024)  Housing: Low Risk (03/30/2024)  Transportation Needs: No Transportation Needs (03/30/2024)  Utilities: Not At Risk (03/30/2024)  Depression (PHQ2-9): Low Risk (01/05/2024)  Social Connections: Moderately Integrated (03/30/2024)  Tobacco Use: Low Risk (03/30/2024)    Readmission Risk Interventions    11/04/2022    1:30 PM  Readmission Risk Prevention Plan  Transportation Screening Complete  PCP or Specialist Appt within 3-5 Days Complete  HRI or Home Care Consult Complete  Social Work Consult for Recovery Care Planning/Counseling Complete  Palliative Care Screening Not Applicable  Medication Review Oceanographer) Complete

## 2024-04-21 NOTE — Assessment & Plan Note (Addendum)
 Presented with an unwitnessed fall Significant weakness unable to get up on his own Strength improving CK was elevated minimally Treated with IV fluid This issue is clinically resolved

## 2024-04-21 NOTE — TOC Transition Note (Signed)
 Transition of Care [facility]) - Discharge Note   Patient Details  Name: Alexander Rich MRN: [id number] Date of Birth: 1925-10-06  Transition of Care [facility]) CM/SW Contact:  Toy LITTIE Agar, RN Phone Number:[phone]  04/21/2024, 3:07 PM   Clinical Narrative:    Patient discharging home HH has been set up with [facility]. [name] has been notified about d/c . No other inpatient care manager needs noted at this time. Inpatient care manager will sign off.      Barriers to Discharge: Continued Medical Work up   Patient Goals and CMS Choice Patient states their goals for this hospitalization and ongoing recovery are:: Wants to go to rehab CMS Medicare.gov Compare Post Acute Care list provided to:: Patient Choice offered to / list presented to : Patient (Niece per patient request [name]) [place] ownership interest in [facility].provided to:: Patient    Discharge Placement                       Discharge Plan and Services Additional resources added to the After Visit Summary for   In-house Referral: NA Discharge Planning Services: CM Consult Post Acute Care Choice: Skilled Nursing Facility          DME Arranged: N/A DME Agency: NA       HH Arranged: NA HH Agency: NA        Social Drivers of Health (SDOH) Interventions SDOH Screenings   Food Insecurity: No Food Insecurity (03/30/2024)  Housing: Low Risk (03/30/2024)  Transportation Needs: No Transportation Needs (03/30/2024)  Utilities: Not At Risk (03/30/2024)  Depression (PHQ2-9): Low Risk (01/05/2024)  Social Connections: Moderately Integrated (03/30/2024)  Tobacco Use: Low Risk (03/30/2024)     Readmission Risk Interventions    11/04/2022    1:30 PM  Readmission Risk Prevention Plan  Transportation Screening Complete  PCP or Specialist Appt within 3-5 Days Complete  HRI or Home Care Consult Complete  Social Work Consult for Recovery Care Planning/Counseling Complete  Palliative Care  Screening Not Applicable  Medication Review Oceanographer) Complete

## 2024-04-21 NOTE — Plan of Care (Signed)
" °  Problem: Clinical Measurements: Goal: Will remain free from infection Outcome: Progressing Goal: Diagnostic test results will improve Outcome: Progressing Goal: Respiratory complications will improve Outcome: Progressing   Problem: Coping: Goal: Level of anxiety will decrease 04/21/2024 0457 by Jerrye Lacinda HERO, RN Outcome: Progressing 04/21/2024 0456 by Jerrye Lacinda HERO, RN Outcome: Progressing   Problem: Elimination: Goal: Will not experience complications related to bowel motility 04/21/2024 0457 by Jerrye Lacinda HERO, RN Outcome: Progressing 04/21/2024 0456 by Jerrye Lacinda HERO, RN Outcome: Progressing Goal: Will not experience complications related to urinary retention 04/21/2024 0457 by Jerrye Lacinda HERO, RN Outcome: Progressing 04/21/2024 0456 by Jerrye Lacinda HERO, RN Outcome: Progressing   Problem: Pain Managment: Goal: General experience of comfort will improve and/or be controlled Outcome: Progressing   "

## 2024-04-21 NOTE — Assessment & Plan Note (Addendum)
 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

## 2024-04-21 NOTE — Progress Notes (Signed)
 Mobility Specialist Progress Note:   04/21/24 1416  Mobility  Activity Pivoted/transferred from chair to bed  Level of Assistance Minimal assist, patient does 75% or more  Assistive Device Front wheel walker  Distance Ambulated (ft) 2 ft  Activity Response Tolerated well  Mobility Referral Yes  Mobility visit 1 Mobility  Mobility Specialist Start Time (ACUTE ONLY) 1400  Mobility Specialist Stop Time (ACUTE ONLY) 1408  Mobility Specialist Time Calculation (min) (ACUTE ONLY) 8 min   Pt was received in recliner and requested to get back in bed. Min A sit to stand. Returned to bed with all needs met. Call bell in reach and bed alarm on.  [employer] - Mobility Specialist

## 2024-04-21 NOTE — Assessment & Plan Note (Addendum)
 Nutrition Problem: Inadequate oral intake Etiology: poor appetite Signs/Symptoms: per patient/family report Interventions: Ensure Enlive (each supplement provides 350kcal and 20 grams of protein), MVI

## 2024-04-21 NOTE — Discharge Summary (Signed)
 " Physician Discharge Summary   Patient: Alexander Rich MRN: [id number] DOB: Jun 13, 1932  Admit date:     03/30/2032  Discharge date: 04/21/2032  Discharge Physician: Delon Herald   PCP: Seabron Lenis, MD   Recommendations at discharge:   You are being discharged home with home health physical and occupational therapy, social worker, RN, and aide You are being referred for outpatient palliative care, as well Follow up with Dr. Seabron in 1-2 weeks Stop Lasix   Discharge Diagnoses: Principal Problem:   Rhabdomyolysis Active Problems:   General weakness   Prostate cancer metastatic to bone [facility]   Essential hypertension   Thrombocytopenia   Sinus bradycardia   B12 deficiency   Macrocytosis   Inadequate oral intake   Falls   Palliative care by specialist   Goals of care, counseling/discussion   Counseling and coordination of care   DNR (do not resuscitate)   Acute urinary retention  Hospital Course: [phone] with h/o UGI bleeding due to duodenal ulcer, HTN, and prostate cancer with bony metastasis who presented on 11/27 with recurrent falls and resultant nontraumatic rhabdomyolysis.  PT/OT consulted and he was recommended for STR in SNF but insurance denied after peer to peer.  Family is appealing this decision.  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- continue Oxycodone  as needed for his back pain Acute urinary retention Required foley catheter on presentation Continue  tamsulosin  Passed voiding trial Essential hypertension Continue losartan  Inadequate oral intake Nutrition Problem: Inadequate oral intake Etiology: poor appetite Signs/Symptoms: per patient/family report Interventions: Ensure Enlive (each supplement provides 350kcal and 20 grams of protein), MVI DNR (do not resuscitate) DNR confirmed at the time of admission Patient will need a gold out of facility DNR form at the time of discharge - signed and on his chart    Consultants: Palliative care PT Inpatient case management   Procedures: None   Antibiotics: None     Pain control - [place]  Controlled Substance Reporting System database was reviewed. and patient was instructed, not to drive, operate heavy machinery, perform activities at heights, swimming or participation in water activities or provide baby-sitting services while on Pain, Sleep and Anxiety Medications; until their outpatient Physician has advised to do so again. Also recommended to not to take more than prescribed Pain, Sleep and Anxiety Medications.   Disposition: Home Diet recommendation:  Regular diet DISCHARGE MEDICATION: Allergies as of 04/21/2024       Reactions   Ibuprofen Other (See Comments)   GI bleed   Nebivolol Hcl Other (See Comments)   Felt blah; low energy   Pravastatin Other (See Comments)   Lethargy   Sertraline Hcl Other (See Comments)   Terrible feeling   Simvastatin Other (See Comments)   Lethargy   Trazodone Other (See Comments)   Foggy-headed        Medication List     STOP taking these medications    furosemide  20 MG tablet Commonly known as: LASIX        TAKE these medications    abiraterone  acetate  250 MG tablet Commonly known as: ZYTIGA  Take 4 tablets (1,000 mg total) by mouth daily on an empty stomach 1 hour before meals OR 2 hours after a meal   acetaminophen  325 MG tablet Commonly known as: TYLENOL  Take 1 tablet (325 mg total) by mouth every 6  (six) hours as needed for mild pain (or Fever >/= 101).   cyanocobalamin  1000 MCG tablet Take 1 tablet (1,000 mcg total) by mouth daily.   feeding supplement Liqd Take 237 mLs by mouth 3 (three) times daily between meals.   losartan  100 MG tablet Commonly known as: COZAAR  Take 1 tablet (100 mg total) by mouth in the morning.   Lubriderm Advanced Therapy Lotn Apply 1 application  topically 3 (three) times daily as needed (for skin dryness - arms and chest).   multivitamin with minerals Tabs tablet Take 1 tablet by mouth daily.   oxyCODONE  5 MG immediate release tablet Commonly known as: Oxy IR/ROXICODONE  Take 1-2 tablets (5-10 mg total) by mouth every 6 (six) hours as needed for severe pain (pain score 7-10). What changed:  when to take this additional instructions   polyethylene glycol 17 g packet Commonly known as: MIRALAX  / GLYCOLAX  Take 17 g by mouth daily as needed. Hold for diarrhea. Take when taking percocet   predniSONE  5 MG tablet Commonly known as: DELTASONE  Take 1 tablet (5 mg total) by mouth daily with breakfast.   senna-docusate 8.6-50 MG tablet Commonly known as: Senokot-S Take 1 tablet by mouth 2 (two) times daily.   Systane Complete PF 0.6 % Soln Generic drug: Propylene Glycol (PF) Place 1 drop into both eyes in the morning, at noon, in the evening, and at bedtime.   tamsulosin  0.4 MG Caps capsule Commonly known as: FLOMAX  Take 0.4 mg by mouth in the morning.   Vitamin B Complex Tabs Take 1 tablet by mouth daily.   vitamin C 1000 MG tablet Take 1,000 mg by mouth daily.   Vitamin D3 1000 units Caps Take 1,000 Units by mouth daily.        Contact information for follow-up providers     Seabron Lenis, MD. Schedule an appointment as soon as possible for a visit in 1 week(s).   Specialty: Family Medicine Contact information: [phone] [address] [place] [address] [phone]              Contact information for  after-discharge care     Destination     [facility] .   Service: Skilled Nursing Contact information: [address] [place]  [phone]             Home Medical Care     [facility]) Follow up.   Service: Home Health Services Contact information: [address] [place]  [address] [phone]                    Discharge Exam:    Subjective: Feeling ok.  He understands the need to return home with [facility] services and feels ready.  He was talking about it on the telephone with his niece at the time of my departure.   Objective: Vitals:   04/20/24 2023 04/21/24 0429  BP: (!) 114/59 (!) 146/63  Pulse: 60 67  Resp: 14 14  Temp: 98.6 F (37 C) 98 F (36.7 C)  SpO2: 99% 96%    Intake/Output Summary (Last 24 hours) at 04/21/2024 1243 Last data  filed at 04/21/2024 0434 Gross per 24 hour  Intake 240 ml  Output 150 ml  Net 90 ml   Filed Weights   03/30/24 1823  Weight: 76.8 kg    Exam:  General:  Appears calm and comfortable and is in NAD Eyes:  normal lids, iris; ectropion ENT:  grossly normal hearing, lips & tongue, mmm Cardiovascular:  RRR. No LE edema.  Respiratory:   CTA bilaterally with no wheezes/rales/rhonchi.  Normal respiratory effort. Abdomen:  soft, NT, ND Skin:  no rash or induration seen on limited exam Musculoskeletal:  grossly normal tone BUE/BLE, good ROM, no bony abnormality Psychiatric:  blunted mood and affect, speech fluent and appropriate, AOx3 Neurologic:  CN 2-12 grossly intact, moves all extremities in coordinated fashion  Data Reviewed: I have reviewed the patient's lab results since admission.  Pertinent labs for today include:   Glucose 136 WBC 7.4 Hgb 11.4, stable    Condition at discharge: stable  The results of significant diagnostics from this hospitalization (including imaging, microbiology, ancillary and  laboratory) are listed below for reference.   Imaging Studies: DG ESOPHAGUS W SINGLE CM (SOL OR THIN BA) Result Date: 04/12/2024 EXAM: SINGLE CONTRAST ESOPHAGRAM 04/12/2024 03:07:00 PM TECHNIQUE: Multiple single contrast images of the esophagus and gastroesophageal junction were obtained following the oral administration of water soluble contrast. The exam was performed in the LPO position and some in the left lateral decubitus position due to the patient's infirmity, limited range of motion, and back pain. FLUOROSCOPY DOSE AND TYPE: Radiation Dose Index: Reference Air Kerma (in mGy) = COMPARISON: None available. CLINICAL HISTORY: Dysphagia. FINDINGS: On the single contrast images, primary peristaltic waves in the esophagus were disrupted in the mid esophagus on all swallows with secondary and tertiary contractions compatible with nonspecific esophageal dysmotility disorder. We were unable to fully distend the distal esophagus due to the degree of dysmotility, and accordingly stricture of the distal most esophagus is not readily excluded. Moderate size hiatal hernia. No evidence of leak. No evidence of achalasia. No gastroesophageal reflux was elicited during examination. Penetration is suggested on image 70 series 5 with thin liquid barium, but no overt aspiration is observed. Atheromatous vascular calcification of the aortic arch. IMPRESSION: 1. 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 2. Moderate hiatal hernia. 3. Suggested penetration with thin liquid barium without overt aspiration. 4. Atheromatous vascular calcification of the aortic arch. Electronically signed by: Ryan Salvage MD 04/12/2024 03:56 PM EST RP Workstation: [id number]   ECHOCARDIOGRAM COMPLETE Result Date: 04/05/2024    ECHOCARDIOGRAM REPORT   Patient Name:   LAQUON EMEL Date of Exam:  04/05/2024 Medical Rec #:  [id number]       Height:       67.0 in Accession #:    [id number]      Weight:       169.3 lb Date of Birth:  1925-06-14      BSA:          1.884 m Patient Age:    89 years        BP:           128/65 mmHg Patient Gender: M               HR:           72 bpm. Exam Location:  Inpatient Procedure: 2D Echo (Both Spectral and Color Flow Doppler were utilized during  procedure). Indications:    CHF  History:        Patient has no prior history of Echocardiogram examinations.  Sonographer:    Charmaine Gaskins Referring Phys: [id number] AMRIT ADHIKARI  Sonographer Comments: Technically challenging study due to limited acoustic windows, Technically difficult study due to poor echo windows and suboptimal subcostal window. IMPRESSIONS  1. Left ventricular ejection fraction, by estimation, is 60 to 65%. Left ventricular ejection fraction by 2D MOD biplane is 62.3 %. The left ventricle has normal function. The left ventricle has no regional wall motion abnormalities. Left ventricular diastolic parameters were normal.  2. Right ventricular systolic function is normal. The right ventricular size is normal.  3. The mitral valve is normal in structure. Mild mitral valve regurgitation. No evidence of mitral stenosis.  4. The aortic valve is normal in structure. Aortic valve regurgitation is mild. Aortic valve sclerosis/calcification is present, without any evidence of aortic stenosis.  5. There is mild dilatation of the ascending aorta, measuring 42 mm.  6. The inferior vena cava is normal in size with greater than 50% respiratory variability, suggesting right atrial pressure of 3 mmHg. Comparison(s): No prior Echocardiogram. FINDINGS  Left Ventricle: Left ventricular ejection fraction, by estimation, is 60 to 65%. Left ventricular ejection fraction by 2D MOD biplane is 62.3 %. The left ventricle has normal function. The left ventricle has no regional wall motion abnormalities. The left ventricular  internal cavity size was normal in size. There is no left ventricular hypertrophy. Left ventricular diastolic parameters were normal. Right Ventricle: The right ventricular size is normal. No increase in right ventricular wall thickness. Right ventricular systolic function is normal. Left Atrium: Left atrial size was normal in size. Right Atrium: Right atrial size was normal in size. Pericardium: There is no evidence of pericardial effusion. Mitral Valve: The mitral valve is normal in structure. Mild mitral valve regurgitation. No evidence of mitral valve stenosis. MV peak gradient, 4.6 mmHg. The mean mitral valve gradient is 1.0 mmHg. Tricuspid Valve: The tricuspid valve is normal in structure. Tricuspid valve regurgitation is not demonstrated. No evidence of tricuspid stenosis. Aortic Valve: The aortic valve is normal in structure. Aortic valve regurgitation is mild. Aortic regurgitation PHT measures 500 msec. Aortic valve sclerosis/calcification is present, without any evidence of aortic stenosis. Aortic valve mean gradient measures 10.0 mmHg. Aortic valve peak gradient measures 22.5 mmHg. Aortic valve area, by VTI measures 2.09 cm. Pulmonic Valve: The pulmonic valve was normal in structure. Pulmonic valve regurgitation is mild. No evidence of pulmonic stenosis. Aorta: The aortic root is normal in size and structure. There is mild dilatation of the ascending aorta, measuring 42 mm. Venous: The inferior vena cava is normal in size with greater than 50% respiratory variability, suggesting right atrial pressure of 3 mmHg. IAS/Shunts: No atrial level shunt detected by color flow Doppler.  LEFT VENTRICLE PLAX 2D                        Biplane EF (MOD) LVIDd:         4.30 cm         LV Biplane EF:   Left LVIDs:         2.40 cm                          ventricular LV PW:         0.90 cm  ejection LV IVS:        0.80 cm                          fraction by LVOT diam:     2.30 cm                           2D MOD LV SV:         88                               biplane is LV SV Index:   47                               62.3 %. LVOT Area:     4.15 cm                                Diastology                                LV e' medial:    7.62 cm/s LV Volumes (MOD)               LV E/e' medial:  10.1 LV vol d, MOD    59.3 ml       LV e' lateral:   11.50 cm/s A2C:                           LV E/e' lateral: 6.7 LV vol d, MOD    61.4 ml A4C: LV vol s, MOD    26.6 ml A2C: LV vol s, MOD    19.1 ml A4C: LV SV MOD A2C:   32.7 ml LV SV MOD A4C:   61.4 ml LV SV MOD BP:    37.9 ml RIGHT VENTRICLE RV Basal diam:  3.10 cm RV Mid diam:    3.10 cm LEFT ATRIUM             Index        RIGHT ATRIUM           Index LA diam:        3.50 cm 1.86 cm/m   RA Area:     12.60 cm LA Vol (A2C):   43.4 ml 23.04 ml/m  RA Volume:   28.20 ml  14.97 ml/m LA Vol (A4C):   61.4 ml 32.59 ml/m LA Biplane Vol: 54.3 ml 28.82 ml/m  AORTIC VALVE                     PULMONIC VALVE AV Area (Vmax):    1.91 cm      PR End Diast Vel: 4.93 msec AV Area (Vmean):   2.21 cm AV Area (VTI):     2.09 cm AV Vmax:           237.00 cm/s AV Vmean:          147.000 cm/s AV VTI:            0.420 m AV Peak Grad:      22.5 mmHg AV Mean Grad:      10.0 mmHg LVOT Vmax:  109.00 cm/s LVOT Vmean:        78.100 cm/s LVOT VTI:          0.211 m LVOT/AV VTI ratio: 0.50 AI PHT:            500 msec  AORTA Ao Root diam: 3.90 cm Ao Asc diam:  4.20 cm MITRAL VALVE                TRICUSPID VALVE MV Area (PHT): 3.10 cm     TR Peak grad:   26.8 mmHg MV Area VTI:   2.88 cm     TR Vmax:        259.00 cm/s MV Peak grad:  4.6 mmHg MV Mean grad:  1.0 mmHg     SHUNTS MV Vmax:       1.07 m/s     Systemic VTI:  0.21 m MV Vmean:      51.9 cm/s    Systemic Diam: 2.30 cm MV E velocity: 77.10 cm/s MV A velocity: 107.00 cm/s MV E/A ratio:  0.72 Franck Azobou Tonleu Electronically signed by Joelle Cedars Tonleu Signature Date/Time: 04/05/2024/5:46:35 PM    Final    DG CHEST PORT 1  VIEW Result Date: 04/04/2024 CLINICAL DATA:  Short of breath EXAM: PORTABLE CHEST 1 VIEW COMPARISON:  Prior chest x-ray 11/02/2022 FINDINGS: Increased pulmonary vascular congestion with interstitial prominence and bibasilar atelectasis. Findings suggest mild interstitial edema. Stable mild cardiomegaly. Atherosclerotic calcifications are present in the transverse aorta. Probable trace bilateral pleural effusions. No pneumothorax. No acute osseous abnormality. IMPRESSION: 1. Findings are most consistent with mild CHF. Stable cardiomegaly with increased pulmonary vascular congestion, mild interstitial edema and likely small bilateral pleural effusions. Electronically Signed   By: Wilkie Lent M.D.   On: 04/04/2024 09:59   CT CHEST ABDOMEN PELVIS W CONTRAST Result Date: 03/30/2024 CLINICAL DATA:  Repeated falls. EXAM: CT CHEST, ABDOMEN, AND PELVIS WITH CONTRAST TECHNIQUE: Multidetector CT imaging of the chest, abdomen and pelvis was performed following the standard protocol during bolus administration of intravenous contrast. RADIATION DOSE REDUCTION: This exam was performed according to the departmental dose-optimization program which includes automated exposure control, adjustment of the mA and/or kV according to patient size and/or use of iterative reconstruction technique. CONTRAST:  OMNIPAQUE  IOHEXOL  300 MG/ML  SOLN COMPARISON:  07/17/2022. FINDINGS: CT CHEST FINDINGS Cardiovascular: Heart normal in size. Three-vessel coronary artery calcifications. No pericardial effusion. Great vessels normal in caliber. Aortic atherosclerosis. Mediastinum/Nodes: No mediastinal hematoma. No neck base, mediastinal or hilar masses. No enlarged lymph nodes. Trachea esophagus are unremarkable. Lungs/Pleura: Trace pleural effusions. Bronchiectasis with associated Peri broncho vascular opacities in the right lower lobe. Linear type opacity noted at the base of the left lower lobe. Mild peripheral areas of interstitial  thickening bilaterally. No lung mass or suspicious nodule. Scattered tiny stable nodules, benign. No pneumothorax. Musculoskeletal: No acute fracture. Sclerotic changes of the T1 vertebra, stable from the prior CT. No chest wall mass. CT ABDOMEN PELVIS FINDINGS Hepatobiliary: Liver normal in size and overall attenuation. Multiple liver cysts, largest measuring 7.8 cm, stable from the prior CT. Gallbladder is distended. Tiny stone in the dependent gallbladder. No wall thickening or inflammation. No bile duct dilation. Pancreas: Partial fatty replacement of the pancreas. No pancreatic mass or inflammation. Spleen: Normal in size without focal abnormality. Adrenals/Urinary Tract: Normal adrenal glands. Kidneys normal in overall size, orientation and position with symmetric enhancement. Several renal cortical cysts, largest exophytic from the lateral lower pole the right kidney, 2.8 cm.  No follow-up recommended. No stones. No hydronephrosis. Normal ureters. Normal bladder. Stomach/Bowel: Moderate hiatal hernia. Stomach otherwise unremarkable. Small bowel and colon are normal in caliber. No wall thickening. No evidence of a bowel injury or mesenteric hematoma. No inflammation. Scattered diverticula mostly along the left colon. No diverticulitis. Vascular/Lymphatic: No vascular injury. Aortic atherosclerosis. No aneurysm. No enlarged lymph nodes. Reproductive: Enlarged prostate, 5.8 x 4.8 cm transversely, stable. Other: Small fat containing inguinal hernias, right greater than left, stable. No ascites. Musculoskeletal: No acute fracture. Depression of the upper endplate of L3 by an apparent prominent Schmorl's node. There is sclerosis of the posterior elements, most evident involving the pedicle and facet on the left of L3. There are additional small sclerotic lesions. Sclerotic changes at L3 have progressed since the prior CT. IMPRESSION: 1. No acute injury to the chest, abdomen or pelvis. 2. Chronic bronchiectasis in  the right lower lobe with peribronchovascular opacities consistent with atelectasis/scarring. Consider a component of acute bronchitis if there are consistent clinical findings. Trace bilateral pleural effusions. 3. Areas of skeletal sclerosis, most evident in the lumbar spine, consistent with blastic prostate carcinoma metastases. Sclerotic changes have progressed in the lumbar spine since the prior exam. 4. No current enlarged lymph nodes. Bulky adenopathy noted on the prior abdomen pelvis CT is no longer visualized. 5. Chronic findings include aortic atherosclerosis, moderate hiatal hernia, liver cysts and colonic diverticula. 6. Stable prostate enlargement. Electronically Signed   By: Alm Parkins M.D.   On: 03/30/2024 15:11   CT Cervical Spine Wo Contrast Result Date: 03/30/2024 CLINICAL DATA:  Multiple falls, history of metastatic prostate cancer EXAM: CT CERVICAL SPINE WITHOUT CONTRAST TECHNIQUE: Multidetector CT imaging of the cervical spine was performed without intravenous contrast. Multiplanar CT image reconstructions were also generated. RADIATION DOSE REDUCTION: This exam was performed according to the departmental dose-optimization program which includes automated exposure control, adjustment of the mA and/or kV according to patient size and/or use of iterative reconstruction technique. COMPARISON:  11/02/2022 FINDINGS: Alignment: Alignment is anatomic. Skull base and vertebrae: No acute displaced fracture. Chronic sclerosis of the T1 vertebral body compatible with known metastatic prostate cancer. No other destructive bony abnormalities. Soft tissues and spinal canal: No prevertebral fluid or swelling. No visible canal hematoma. Disc levels: Multilevel facet hypertrophic changes with bony fusion across the left C4-5 facet joint. There is partial bony fusion across the C4-5 disc space as well. Moderate spondylosis at C3-4. Upper chest: Airway is patent.  Lung apices are clear. Other: Reconstructed  images demonstrate no additional findings. IMPRESSION: 1. No acute cervical spine fracture. 2. Stable multilevel cervical degenerative changes. 3. Stable sclerotic metastasis at the T1 vertebral body. Electronically Signed   By: Ozell Daring M.D.   On: 03/30/2024 15:02   CT Head Wo Contrast Result Date: 03/30/2024 CLINICAL DATA:  Multiple falls, head trauma EXAM: CT HEAD WITHOUT CONTRAST TECHNIQUE: Contiguous axial images were obtained from the base of the skull through the vertex without intravenous contrast. RADIATION DOSE REDUCTION: This exam was performed according to the departmental dose-optimization program which includes automated exposure control, adjustment of the mA and/or kV according to patient size and/or use of iterative reconstruction technique. COMPARISON:  11/02/2022 FINDINGS: Brain: Chronic small vessel ischemic changes are seen within the periventricular white matter and bilateral thalami. No acute infarct or hemorrhage. Lateral ventricles and midline structures are unremarkable. No acute extra-axial fluid collections. No mass effect. Vascular: No hyperdense vessel or unexpected calcification. Skull: Normal. Negative for fracture or focal lesion. Sinuses/Orbits: No  acute finding. Other: None. IMPRESSION: 1. No acute intracranial process. Electronically Signed   By: Ozell Daring M.D.   On: 03/30/2024 14:58   DG Shoulder Right Result Date: 03/30/2024 CLINICAL DATA:  Clemens yesterday afternoon, found this morning. Right shoulder pain. EXAM: RIGHT SHOULDER - 2+ VIEW COMPARISON:  None Available. FINDINGS: No fracture.  No bone lesion. Glenohumeral and AC joints are normally aligned. Mild AC joint osteoarthritis. Narrowed subacromial space suggests a chronic rotator cuff tear. Skeletal structures are demineralized. IMPRESSION: 1. No fracture or dislocation. Electronically Signed   By: Alm Parkins M.D.   On: 03/30/2024 14:39    Microbiology: Results for orders placed or performed during  the hospital encounter of 03/30/24  Resp panel by RT-PCR (RSV, Flu A&B, Covid) Anterior Nasal Swab     Status: None   Collection Time: 03/30/24  1:23 PM   Specimen: Anterior Nasal Swab  Result Value Ref Range Status   SARS Coronavirus 2 by RT PCR NEGATIVE NEGATIVE Final    Comment: (NOTE) SARS-CoV-2 target nucleic acids are NOT DETECTED.  The SARS-CoV-2 RNA is generally detectable in upper respiratory specimens during the acute phase of infection. The lowest concentration of SARS-CoV-2 viral copies this assay can detect is 138 copies/mL. A negative result does not preclude SARS-Cov-2 infection and should not be used as the sole basis for treatment or other patient management decisions. A negative result may occur with  improper specimen collection/handling, submission of specimen other than nasopharyngeal swab, presence of viral mutation(s) within the areas targeted by this assay, and inadequate number of viral copies(<138 copies/mL). A negative result must be combined with clinical observations, patient history, and epidemiological information. The expected result is Negative.  Fact Sheet for Patients:  bloggercourse.com  Fact Sheet for Healthcare Providers:  seriousbroker.it  This test is no t yet approved or cleared by the [place]  FDA and  has been authorized for detection and/or diagnosis of SARS-CoV-2 by FDA under an Emergency Use Authorization (EUA). This EUA will remain  in effect (meaning this test can be used) for the duration of the COVID-19 declaration under Section 564(b)(1) of the Act, 21 U.S.C.section 360bbb-3(b)(1), unless the authorization is terminated  or revoked sooner.       Influenza A by PCR NEGATIVE NEGATIVE Final   Influenza B by PCR NEGATIVE NEGATIVE Final    Comment: (NOTE) The Xpert Xpress SARS-CoV-2/FLU/RSV plus assay is intended as an aid in the diagnosis of influenza from Nasopharyngeal swab  specimens and should not be used as a sole basis for treatment. Nasal washings and aspirates are unacceptable for Xpert Xpress SARS-CoV-2/FLU/RSV testing.  Fact Sheet for Patients: bloggercourse.com  Fact Sheet for Healthcare Providers: seriousbroker.it  This test is not yet approved or cleared by the [place]  FDA and has been authorized for detection and/or diagnosis of SARS-CoV-2 by FDA under an Emergency Use Authorization (EUA). This EUA will remain in effect (meaning this test can be used) for the duration of the COVID-19 declaration under Section 564(b)(1) of the Act, 21 U.S.C. section 360bbb-3(b)(1), unless the authorization is terminated or revoked.     Resp Syncytial Virus by PCR NEGATIVE NEGATIVE Final    Comment: (NOTE) Fact Sheet for Patients: bloggercourse.com  Fact Sheet for Healthcare Providers: seriousbroker.it  This test is not yet approved or cleared by the [place]  FDA and has been authorized for detection and/or diagnosis of SARS-CoV-2 by FDA under an Emergency Use Authorization (EUA). This EUA will remain in effect (meaning this test  can be used) for the duration of the COVID-19 declaration under Section 564(b)(1) of the Act, 21 U.S.C. section 360bbb-3(b)(1), unless the authorization is terminated or revoked.  Performed at [facility], [address] [place], [place] [address]     Labs: CBC: Recent Labs  Lab 04/16/24 0730 04/21/24 0746  WBC 7.2 7.4  HGB 11.1* 11.4*  HCT 34.2* 35.8*  MCV 100.3* 101.1*  PLT 256 183   Basic Metabolic Panel: Recent Labs  Lab 04/16/24 0730 04/21/24 0746  NA 138 138  K 3.9 4.6  CL 105 104  CO2 26 25  GLUCOSE 94 136*  BUN 13 16  CREATININE 0.92 1.00  CALCIUM 8.6* 8.8*  MG 2.5*  --    Liver Function Tests: No results for input(s): AST, ALT, ALKPHOS, BILITOT, PROT,  ALBUMIN in the last 168 hours. CBG: No results for input(s): GLUCAP in the last 168 hours.  Discharge time spent: greater than 30 minutes.  Signed: Delon Herald, MD [facility] 04/21/2024 "

## 2024-04-21 NOTE — Assessment & Plan Note (Addendum)
 Required foley catheter on presentation Continue tamsulosin  Passed voiding trial

## 2024-04-21 NOTE — Assessment & Plan Note (Addendum)
 PT/OT consulted and recommended STR Insurance reviewed and denied, even after peer to peer Family has appealed without success He will need to dc to home with maximized home health support

## 2024-04-21 NOTE — Plan of Care (Signed)

## 2024-04-21 NOTE — Progress Notes (Signed)
 Mobility Specialist - Progress Note   04/21/24 1226  Mobility  Activity Ambulated with assistance  Level of Assistance Minimal assist, patient does 75% or more  Assistive Device Four wheel walker  Distance Ambulated (ft) 65 ft  Activity Response Tolerated well  Mobility visit 1 Mobility  Mobility Specialist Start Time (ACUTE ONLY) 1152  Mobility Specialist Stop Time (ACUTE ONLY) 1211  Mobility Specialist Time Calculation (min) (ACUTE ONLY) 19 min   Pt agreeable to ambulate, required Min A to stand from EOB. Pt required cues and steadying support when turning and to avoid hitting wall rails. Pt was left sitting in recliner with call light at side. RN notified of session.   Alexander Rich Mobility Specialist Acute Rehabilitation Services 04/21/2024, 12:29 PM

## 2024-04-22 ENCOUNTER — Encounter (HOSPITAL_COMMUNITY): Payer: Self-pay | Admitting: Emergency Medicine

## 2024-04-22 ENCOUNTER — Other Ambulatory Visit: Payer: Self-pay

## 2024-04-22 ENCOUNTER — Observation Stay (HOSPITAL_COMMUNITY)
Admission: EM | Admit: 2024-04-22 | Discharge: 2024-05-02 | Disposition: A | Discharge status: Other Acute Inpatient Hospital | Attending: Emergency Medicine | Admitting: Emergency Medicine

## 2024-04-22 DIAGNOSIS — C61 Malignant neoplasm of prostate: Secondary | ICD-10-CM | POA: Diagnosis present

## 2024-04-22 DIAGNOSIS — I10 Essential (primary) hypertension: Secondary | ICD-10-CM | POA: Diagnosis not present

## 2024-04-22 DIAGNOSIS — C7951 Secondary malignant neoplasm of bone: Secondary | ICD-10-CM | POA: Insufficient documentation

## 2024-04-22 DIAGNOSIS — N3000 Acute cystitis without hematuria: Secondary | ICD-10-CM | POA: Diagnosis not present

## 2024-04-22 DIAGNOSIS — R338 Other retention of urine: Secondary | ICD-10-CM | POA: Diagnosis present

## 2024-04-22 DIAGNOSIS — R627 Adult failure to thrive: Secondary | ICD-10-CM | POA: Diagnosis present

## 2024-04-22 DIAGNOSIS — S61412A Laceration without foreign body of left hand, initial encounter: Secondary | ICD-10-CM | POA: Diagnosis not present

## 2024-04-22 DIAGNOSIS — K5900 Constipation, unspecified: Secondary | ICD-10-CM | POA: Diagnosis not present

## 2024-04-22 DIAGNOSIS — N400 Enlarged prostate without lower urinary tract symptoms: Secondary | ICD-10-CM | POA: Insufficient documentation

## 2024-04-22 DIAGNOSIS — Z602 Problems related to living alone: Secondary | ICD-10-CM

## 2024-04-22 DIAGNOSIS — R8271 Bacteriuria: Secondary | ICD-10-CM | POA: Diagnosis not present

## 2024-04-22 DIAGNOSIS — X58XXXA Exposure to other specified factors, initial encounter: Secondary | ICD-10-CM | POA: Insufficient documentation

## 2024-04-22 DIAGNOSIS — R339 Retention of urine, unspecified: Secondary | ICD-10-CM

## 2024-04-22 DIAGNOSIS — R531 Weakness: Secondary | ICD-10-CM | POA: Diagnosis present

## 2024-04-22 DIAGNOSIS — D696 Thrombocytopenia, unspecified: Secondary | ICD-10-CM | POA: Insufficient documentation

## 2024-04-22 LAB — CBC WITH DIFFERENTIAL/PLATELET
Abs Immature Granulocytes: 0.03 K/uL (ref 0.00–0.07)
Basophils Absolute: 0 K/uL (ref 0.0–0.1)
Basophils Relative: 0 %
Eosinophils Absolute: 0.1 K/uL (ref 0.0–0.5)
Eosinophils Relative: 1 %
HCT: 36.2 % — ABNORMAL LOW (ref 39.0–52.0)
Hemoglobin: 11.6 g/dL — ABNORMAL LOW (ref 13.0–17.0)
Immature Granulocytes: 0 %
Lymphocytes Relative: 23 %
Lymphs Abs: 2 K/uL (ref 0.7–4.0)
MCH: 32.6 pg (ref 26.0–34.0)
MCHC: 32 g/dL (ref 30.0–36.0)
MCV: 101.7 fL — ABNORMAL HIGH (ref 80.0–100.0)
Monocytes Absolute: 1 K/uL (ref 0.1–1.0)
Monocytes Relative: 11 %
Neutro Abs: 5.4 K/uL (ref 1.7–7.7)
Neutrophils Relative %: 65 %
Platelets: 167 K/uL (ref 150–400)
RBC: 3.56 MIL/uL — ABNORMAL LOW (ref 4.22–5.81)
RDW: 14.1 % (ref 11.5–15.5)
WBC: 8.5 K/uL (ref 4.0–10.5)
nRBC: 0 % (ref 0.0–0.2)

## 2024-04-22 LAB — URINALYSIS, W/ REFLEX TO CULTURE (INFECTION SUSPECTED)
Bilirubin Urine: NEGATIVE
Glucose, UA: NEGATIVE mg/dL
Ketones, ur: NEGATIVE mg/dL
Nitrite: NEGATIVE
Protein, ur: 30 mg/dL — AB
Specific Gravity, Urine: 1.012 (ref 1.005–1.030)
WBC, UA: >50 WBC/hpf (ref 0–5)
pH: 6 (ref 5.0–8.0)

## 2024-04-22 LAB — BASIC METABOLIC PANEL WITH GFR
Anion gap: 6 (ref 5–15)
BUN: 24 mg/dL — ABNORMAL HIGH (ref 8–23)
CO2: 27 mmol/L (ref 22–32)
Calcium: 9 mg/dL (ref 8.9–10.3)
Chloride: 105 mmol/L (ref 98–111)
Creatinine, Ser: 1.19 mg/dL (ref 0.61–1.24)
GFR, Estimated: 56 mL/min — ABNORMAL LOW
Glucose, Bld: 83 mg/dL (ref 70–99)
Potassium: 4.4 mmol/L (ref 3.5–5.1)
Sodium: 138 mmol/L (ref 135–145)

## 2024-04-22 MED ORDER — ONDANSETRON HCL 4 MG PO TABS: 4.0000 mg | ORAL_TABLET | Freq: Four times a day (QID) | ORAL | Status: DC | PRN

## 2024-04-22 MED ORDER — SODIUM CHLORIDE 0.9 % IV SOLN
2.0000 g | Freq: Once | INTRAVENOUS | Status: AC
  Administered 2024-04-22: 2 g via INTRAVENOUS
  Filled 2024-04-22: qty 20

## 2024-04-22 MED ORDER — PREDNISONE 5 MG PO TABS
5.0000 mg | ORAL_TABLET | Freq: Every day | ORAL | Status: DC
  Administered 2024-04-23 – 2024-04-29 (×7): 5 mg via ORAL
  Filled 2024-04-22 (×7): qty 1

## 2024-04-22 MED ORDER — POLYETHYLENE GLYCOL 3350 17 G PO PACK
17.0000 g | PACK | Freq: Every day | ORAL | Status: DC | PRN
  Administered 2024-04-28: 17 g via ORAL
  Filled 2024-04-22: qty 1

## 2024-04-22 MED ORDER — SODIUM CHLORIDE 0.9 % IV BOLUS
1000.0000 mL | Freq: Once | INTRAVENOUS | Status: AC
  Administered 2024-04-22: 1000 mL via INTRAVENOUS

## 2024-04-22 MED ORDER — SENNOSIDES-DOCUSATE SODIUM 8.6-50 MG PO TABS
1.0000 | ORAL_TABLET | Freq: Two times a day (BID) | ORAL | Status: DC
  Administered 2024-04-23 – 2024-05-02 (×14): 1 via ORAL
  Filled 2024-04-22 (×13): qty 1

## 2024-04-22 MED ORDER — TAMSULOSIN HCL 0.4 MG PO CAPS
0.4000 mg | ORAL_CAPSULE | Freq: Every morning | ORAL | Status: DC
  Administered 2024-04-23 – 2024-05-02 (×10): 0.4 mg via ORAL
  Filled 2024-04-22 (×8): qty 1

## 2024-04-22 MED ORDER — ACETAMINOPHEN 650 MG RE SUPP: 650.0000 mg | Freq: Four times a day (QID) | RECTAL | Status: DC | PRN

## 2024-04-22 MED ORDER — OXYCODONE HCL 5 MG PO TABS
5.0000 mg | ORAL_TABLET | Freq: Four times a day (QID) | ORAL | Status: DC | PRN
  Administered 2024-04-22 (×2): 5 mg via ORAL
  Administered 2024-04-23 – 2024-04-27 (×9): 10 mg via ORAL
  Administered 2024-05-02 (×2): 5 mg via ORAL
  Filled 2024-04-22 (×7): qty 2
  Filled 2024-04-22: qty 1
  Filled 2024-04-22: qty 2
  Filled 2024-04-22: qty 1
  Filled 2024-04-22 (×3): qty 2

## 2024-04-22 MED ORDER — ONDANSETRON HCL 4 MG/2ML IJ SOLN
4.0000 mg | Freq: Four times a day (QID) | INTRAMUSCULAR | Status: DC | PRN
  Administered 2024-04-23: 4 mg via INTRAVENOUS
  Filled 2024-04-22: qty 2

## 2024-04-22 MED ORDER — CHLORHEXIDINE GLUCONATE CLOTH 2 % EX PADS
6.0000 | MEDICATED_PAD | Freq: Every day | CUTANEOUS | Status: DC
  Administered 2024-04-23 – 2024-05-01 (×9): 6 via TOPICAL

## 2024-04-22 MED ORDER — ABIRATERONE ACETATE 250 MG PO TABS: 1000.0000 mg | ORAL_TABLET | Freq: Every day | ORAL | Status: DC

## 2024-04-22 MED ORDER — LOSARTAN POTASSIUM 50 MG PO TABS
100.0000 mg | ORAL_TABLET | Freq: Every morning | ORAL | Status: DC
  Administered 2024-04-23 – 2024-05-02 (×8): 100 mg via ORAL
  Filled 2024-04-22 (×8): qty 2

## 2024-04-22 MED ORDER — ENOXAPARIN SODIUM 40 MG/0.4ML IJ SOSY
40.0000 mg | PREFILLED_SYRINGE | INTRAMUSCULAR | Status: DC
  Administered 2024-04-23 – 2024-05-01 (×9): 40 mg via SUBCUTANEOUS
  Filled 2024-04-22 (×8): qty 0.4

## 2024-04-22 MED ORDER — ACETAMINOPHEN 325 MG PO TABS
650.0000 mg | ORAL_TABLET | Freq: Four times a day (QID) | ORAL | Status: DC | PRN
  Administered 2024-04-27: 650 mg via ORAL
  Filled 2024-04-22 (×2): qty 2

## 2024-04-22 NOTE — H&P (Signed)
 " History and Physical    Alexander Rich FMW:[id number] DOB: 23-Feb-1926 DOA: 04/22/2024  PCP: Seabron Lenis, MD   Patient coming from: Home   Chief Complaint: Too weak to do ADLs  HPI: Alexander Rich is a 89 y.o. male with medical history significant for prostate cancer metastatic to bone, hypertension, PUD, and recent admission with rhabdomyolysis and urinary retention who was discharged home yesterday but now returns due to being unable to care for himself at home.    Patient reports that he is too generally weak and unsteady on his feet to get around his house and care for himself.  His only local relatives are quite elderly themselves and unable to assist him.  Insurance denied SNF stay recently despite appeal.  He denies any fevers, chills, chest pain, abdominal pain, dysuria, flank pain, or new focal numbness or weakness.  He denies having any new change in his condition but does not feel that he can safely live alone anymore.  ED Course: Upon arrival to the ED, patient is found to be afebrile and saturating well on room air with normal RR, normal HR, and stable BP.  Labs are notable for mildly elevated BUN, mild microcytic anemia, bacteriuria, and pyuria.  Urine culture was collected, Foley catheter was placed for urinary retention, and the patient was treated with 1 L NS, 2 g IV Rocephin , and oxycodone  x 2.  Review of Systems:  All other systems reviewed and apart from HPI, are negative.  Past Medical History:  Diagnosis Date   B12 deficiency 12/02/2022   Constipation    Hip pain    Hyperpigmented skin lesions 03/21/2022   Hypertension    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   Vitamin D deficiency 03/30/2024    Past Surgical History:  Procedure Laterality Date   BACK SURGERY     ESOPHAGOGASTRODUODENOSCOPY N/A 03/24/2022   Procedure: ESOPHAGOGASTRODUODENOSCOPY (EGD);  Surgeon: Burnette Fallow, MD;  Location: [facility];  Service: Gastroenterology;   Laterality: N/A;    Social History:   reports that he has never smoked. He has never used smokeless tobacco. He reports current alcohol  use. He reports that he does not use drugs.  Allergies[1]  Family History  Problem Relation Age of Onset   Kidney disease Mother    CVA Father      Prior to Admission medications  Medication Sig Start Date End Date Taking? Authorizing Provider  abiraterone  acetate (ZYTIGA ) 250 MG tablet Take 4 tablets (1,000 mg total) by mouth daily on an empty stomach 1 hour before meals OR 2 hours after a meal 03/08/24   Burton, Lacie K, NP  acetaminophen  (TYLENOL ) 325 MG tablet Take 1 tablet (325 mg total) by mouth every 6 (six) hours as needed for mild pain (or Fever >/= 101). 03/25/22   Sebastian Toribio GAILS, MD  Ascorbic Acid (VITAMIN C) 1000 MG tablet Take 1,000 mg by mouth daily.    [provider]  B Complex Vitamins (VITAMIN B COMPLEX) TABS Take 1 tablet by mouth daily.    [provider]  Cholecalciferol (VITAMIN D3) 1000 units CAPS Take 1,000 Units by mouth daily.    [provider]  cyanocobalamin  1000 MCG tablet Take 1 tablet (1,000 mcg total) by mouth daily. 04/11/24   Rizwan, Saima, MD  Emollient [facility] ADVANCED THERAPY) LOTN Apply 1 application  topically 3 (three) times daily as needed (for skin dryness - arms and chest).    [provider]  feeding supplement (ENSURE  PLUS HIGH PROTEIN) LIQD Take 237 mLs by mouth 3 (three) times daily between meals. 04/21/24   Barbarann Nest, MD  losartan  (COZAAR ) 100 MG tablet Take 1 tablet (100 mg total) by mouth in the morning. 03/30/22   Sebastian Toribio GAILS, MD  Multiple Vitamin (MULTIVITAMIN WITH MINERALS) TABS tablet Take 1 tablet by mouth daily. 04/11/24   Rizwan, Saima, MD  oxyCODONE  (OXY IR/ROXICODONE ) 5 MG immediate release tablet Take 1-2 tablets (5-10 mg total) by mouth every 6 (six) hours as needed for severe pain (pain score 7-10). Patient taking differently: Take 5-10 mg  by mouth See admin instructions. Take 5-10 mg by mouth in the morning & at bedtime and may take an additional 5-10 mg twice a day as needed for unresolved pain 03/03/24   Boscia, Heather E, NP  polyethylene glycol (MIRALAX  / GLYCOLAX ) 17 g packet Take 17 g by mouth daily as needed. Hold for diarrhea. Take when taking percocet 03/25/22   Sebastian Toribio GAILS, MD  predniSONE  (DELTASONE ) 5 MG tablet Take 1 tablet (5 mg total) by mouth daily with breakfast. 01/05/24   Lanny Callander, MD  senna-docusate (SENOKOT-S) 8.6-50 MG tablet Take 1 tablet by mouth 2 (two) times daily. 04/11/24   Rizwan, Saima, MD  SYSTANE COMPLETE PF 0.6 % SOLN Place 1 drop into both eyes in the morning, at noon, in the evening, and at bedtime.    [provider]  tamsulosin  (FLOMAX ) 0.4 MG CAPS capsule Take 0.4 mg by mouth in the morning. 09/12/19   [provider]    Physical Exam: Vitals:   04/22/24 1600 04/22/24 1630 04/22/24 1830 04/22/24 1900  BP: 113/65 136/69 111/71 129/65  Pulse: (!) 58 66 72 68  Resp:    16  Temp:    99.5 F (37.5 C)  TempSrc:      SpO2: 97% 97% 99% 96%  Weight:      Height:        Constitutional: NAD, calm  Eyes: PERTLA, blepharitis ENMT: Mucous membranes are moist. Posterior pharynx clear of any exudate or lesions.   Neck: supple, no masses  Respiratory: no wheezing, no crackles. No accessory muscle use.  Cardiovascular: S1 & S2 heard, regular rate and rhythm. No extremity edema.  Abdomen: No tenderness, soft. Bowel sounds active.  Musculoskeletal: no clubbing / cyanosis. No joint deformity upper and lower extremities.   Skin: no significant rashes, lesions, ulcers. Warm, dry, well-perfused. Neurologic: CN 2-12 grossly intact. Moving all extremities. Alert and oriented to person, place, and situation.  Psychiatric: Pleasant. Cooperative.    Labs and Imaging on Admission: I have personally reviewed following labs and imaging studies  CBC: Recent Labs  Lab 04/16/24 0730  04/21/24 0746 04/22/24 1646  WBC 7.2 7.4 8.5  NEUTROABS  --   --  5.4  HGB 11.1* 11.4* 11.6*  HCT 34.2* 35.8* 36.2*  MCV 100.3* 101.1* 101.7*  PLT 256 183 167   Basic Metabolic Panel: Recent Labs  Lab 04/16/24 0730 04/21/24 0746 04/22/24 1646  NA 138 138 138  K 3.9 4.6 4.4  CL 105 104 105  CO2 26 25 27   GLUCOSE 94 136* 83  BUN 13 16 24*  CREATININE 0.92 1.00 1.19  CALCIUM 8.6* 8.8* 9.0  MG 2.5*  --   --    GFR: Estimated Creatinine Clearance: 33.9 mL/min (by C-G formula based on SCr of 1.19 mg/dL). Liver Function Tests: No results for input(s): AST, ALT, ALKPHOS, BILITOT, PROT, ALBUMIN in the last 168 hours.  No results for input(s): LIPASE, AMYLASE in the last 168 hours. No results for input(s): AMMONIA in the last 168 hours. Coagulation Profile: No results for input(s): INR, PROTIME in the last 168 hours. Cardiac Enzymes: No results for input(s): CKTOTAL, CKMB, CKMBINDEX, TROPONINI in the last 168 hours. BNP (last 3 results) Recent Labs    04/05/24 0515  PROBNP 1,195.0*   HbA1C: No results for input(s): HGBA1C in the last 72 hours. CBG: No results for input(s): GLUCAP in the last 168 hours. Lipid Profile: No results for input(s): CHOL, HDL, LDLCALC, TRIG, CHOLHDL, LDLDIRECT in the last 72 hours. Thyroid  Function Tests: No results for input(s): TSH, T4TOTAL, FREET4, T3FREE, THYROIDAB in the last 72 hours. Anemia Panel: No results for input(s): VITAMINB12, FOLATE, FERRITIN, TIBC, IRON, RETICCTPCT in the last 72 hours. Urine analysis:    Component Value Date/Time   COLORURINE YELLOW 04/22/2024 2241   APPEARANCEUR CLOUDY (A) 04/22/2024 2241   LABSPEC 1.012 04/22/2024 2241   PHURINE 6.0 04/22/2024 2241   GLUCOSEU NEGATIVE 04/22/2024 2241   HGBUR MODERATE (A) 04/22/2024 2241   BILIRUBINUR NEGATIVE 04/22/2024 2241   KETONESUR NEGATIVE 04/22/2024 2241   PROTEINUR 30 (A) 04/22/2024 2241    NITRITE NEGATIVE 04/22/2024 2241   LEUKOCYTESUR LARGE (A) 04/22/2024 2241   Sepsis Labs: @LABRCNTIP (procalcitonin:4,lacticidven:4) )No results found for this or any previous visit (from the past 240 hours).   Radiological Exams on Admission: No results found.   Assessment/Plan  1. Failure to thrive  - Patient denies having any acute change in his condition but is unable to safely live alone due to his chronic physical debility    - There does not appear to be any reversible cause for his debility on ED workup, will add B12 and TSH  - TOC consulted    2. Acute urinary retention; bacteriuria  - Foley was placed in ED for retention; bacteriuria and pyuria noted on UA but patient denies urinary symptoms and there is no fever or leukocytosis   - Continue Foley for now    3. Hypertension  - Continue losartan     4. Prostate cancer with bone mets and cancer-related pain  - Continue Zytiga , prednisone , and as-needed oxycodone      DVT prophylaxis: Lovenox   Code Status: DNR/DNI, confirmed on admission  Level of Care: Level of care: Med-Surg Family Communication: none present   Disposition Plan:  Patient is from: Home  Anticipated d/c is to: TBD Anticipated d/c date is: Possibly as early as 04/23/24  Patient currently: Pending disposition planning  Consults called: None  Admission status: Observation     Evalene GORMAN Sprinkles, MD [facility]  04/22/2024, 11:22 PM       [1]  Allergies Allergen Reactions   Ibuprofen Other (See Comments)    GI bleed   Nebivolol Hcl Other (See Comments)    Felt blah; low energy   Pravastatin Other (See Comments)    Lethargy   Sertraline Hcl Other (See Comments)    Terrible feeling   Simvastatin Other (See Comments)    Lethargy   Trazodone Other (See Comments)    Foggy-headed   "

## 2024-04-22 NOTE — ED Triage Notes (Signed)
 Pt via [facility] from home. Pt was just discharged yesterday after being in the hospital since late November. Pt has not been eating or drinking, not performing ADLs, and has limited mobility since discharge yesterday. Pt has no acute pain or GI complaints but family is concerned about failure to thrive and wants eval for SNF placement due to safety concerns.   A/O x 4 DNR BP 88/50 (soft at baseline) HR 85 RR 16 O2 97% RA CBG 192

## 2024-04-22 NOTE — ED Provider Notes (Signed)
 "  EMERGENCY DEPARTMENT AT [facility] Provider Note  CSN: [id number] Arrival date & time: 04/22/24 1503  Chief Complaint(s) Fatigue  HPI Alexander Rich is a 89 y.o. male history of metastatic prostate cancer, hypertension presenting to the emergency department with weakness.  Patient reports that he is 19 and feels extremely weak.  He is having trouble caring for himself at home.  He has trouble ambulating.  He lives alone.  His only family members that are still living are an 52 year old nephew and an 89 year old niece who are also elderly and cannot really help him.  He denies any specific focal symptoms other than chronic back pain.  Denies any chest pain, shortness of breath.  Denies any urinary symptoms.  Denies any fevers.  Denies any new falls or injury.  Denies any headaches.  Denies any swelling in the legs.  Denies any cough.  Denies any sore throat.  Overall feels essentially at baseline, just cannot care for himself..   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   Vitamin D deficiency 03/30/2024   Patient Active Problem List   Diagnosis Date Noted   Failure to thrive in adult 04/22/2024   Acute urinary retention 04/19/2024   Palliative care by specialist 04/05/2024   Goals of care, counseling/discussion 04/05/2024   Counseling and coordination of care 04/05/2024   DNR (do not resuscitate) 04/05/2024   Rhabdomyolysis 03/30/2024   Macrocytosis 03/30/2024   Inadequate oral intake 03/30/2024   Duodenal ulcer 03/30/2024   Cardiac arrhythmia 03/30/2024   Chronic idiopathic constipation 03/30/2024   Mixed hyperlipidemia 03/30/2024   Odynophagia 03/30/2024   Vitamin D deficiency 03/30/2024   Falls 03/30/2024   B12 deficiency 12/02/2022   Fall at home, initial encounter  11/03/2022   COVID-19 virus infection 11/03/2022   Dehydration 11/03/2022   Acute prerenal azotemia 11/03/2022   General weakness 11/02/2022   Sigmoid thickening 07/18/2022   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   Prostate enlargement 07/16/2022   Bilateral lower extremity edema 07/14/2022   History of duodenal ulcer 03/25/2022   Primary localized osteoarthritis of left hip 03/24/2022   Benign prostatic hyperplasia without urinary obstruction 03/22/2022   Sinus bradycardia 03/22/2022   Acute upper gastrointestinal bleeding 03/21/2022   Leukocytosis 03/21/2022   Macrocytic anemia 03/21/2022   Essential hypertension 03/21/2022   Hyperpigmented skin lesions 03/21/2022   Thrombocytopenia 03/21/2022   Pain of left hip joint 11/17/2021   Osteoarthritis of knee 02/17/2021   Home Medication(s) Prior to Admission medications  Medication Sig Start Date End Date Taking? Authorizing Provider  abiraterone  acetate (ZYTIGA ) 250 MG tablet Take 4 tablets (1,000 mg total) by mouth daily on an empty stomach 1 hour before meals OR 2 hours after a meal 03/08/24   Burton, Lacie K, NP  acetaminophen  (TYLENOL ) 325 MG tablet Take 1 tablet (325 mg total) by mouth every 6 (six) hours as needed for mild pain (or Fever >/= 101). 03/25/22   Sebastian Toribio GAILS, MD  Ascorbic Acid (VITAMIN C) 1000 MG tablet Take 1,000 mg by mouth daily.    [provider]  B Complex Vitamins (VITAMIN B COMPLEX) TABS Take 1 tablet by mouth daily.    [provider]  Cholecalciferol (VITAMIN D3) 1000 units CAPS Take 1,000 Units by mouth  daily.    [provider]  cyanocobalamin  1000 MCG tablet Take 1 tablet (1,000 mcg total) by mouth daily. 04/11/24   Rizwan, Saima, MD  Emollient [facility] ADVANCED THERAPY) LOTN Apply 1 application  topically 3 (three) times daily as needed (for skin dryness - arms and chest).    [provider]  feeding supplement (ENSURE PLUS HIGH PROTEIN) LIQD Take  237 mLs by mouth 3 (three) times daily between meals. 04/21/24   Barbarann Nest, MD  losartan  (COZAAR ) 100 MG tablet Take 1 tablet (100 mg total) by mouth in the morning. 03/30/22   Sebastian Toribio GAILS, MD  Multiple Vitamin (MULTIVITAMIN WITH MINERALS) TABS tablet Take 1 tablet by mouth daily. 04/11/24   Rizwan, Saima, MD  oxyCODONE  (OXY IR/ROXICODONE ) 5 MG immediate release tablet Take 1-2 tablets (5-10 mg total) by mouth every 6 (six) hours as needed for severe pain (pain score 7-10). Patient taking differently: Take 5-10 mg by mouth See admin instructions. Take 5-10 mg by mouth in the morning & at bedtime and may take an additional 5-10 mg twice a day as needed for unresolved pain 03/03/24   Boscia, Heather E, NP  polyethylene glycol (MIRALAX  / GLYCOLAX ) 17 g packet Take 17 g by mouth daily as needed. Hold for diarrhea. Take when taking percocet 03/25/22   Sebastian Toribio GAILS, MD  predniSONE  (DELTASONE ) 5 MG tablet Take 1 tablet (5 mg total) by mouth daily with breakfast. 01/05/24   Lanny Callander, MD  senna-docusate (SENOKOT-S) 8.6-50 MG tablet Take 1 tablet by mouth 2 (two) times daily. 04/11/24   Rizwan, Saima, MD  SYSTANE COMPLETE PF 0.6 % SOLN Place 1 drop into both eyes in the morning, at noon, in the evening, and at bedtime.    [provider]  tamsulosin  (FLOMAX ) 0.4 MG CAPS capsule Take 0.4 mg by mouth in the morning. 09/12/19   [provider]                                                                                                                                    Past Surgical History Past Surgical History:  Procedure Laterality Date   BACK SURGERY     ESOPHAGOGASTRODUODENOSCOPY N/A 03/24/2022   Procedure: ESOPHAGOGASTRODUODENOSCOPY (EGD);  Surgeon: Burnette Fallow, MD;  Location: [facility];  Service: Gastroenterology;  Laterality: N/A;   Family History Family History  Problem Relation Age of Onset   Kidney disease Mother    CVA Father     Social  History Social History[1] Allergies Ibuprofen, Nebivolol hcl, Pravastatin, Sertraline hcl, Simvastatin, and Trazodone  Review of Systems Review of Systems  All other systems reviewed and are negative.   Physical Exam Vital Signs  I have reviewed the triage vital signs BP 129/65   Pulse 68   Temp 99.5 F (37.5 C)   Resp 16   Ht 5' 7 (1.702 m)   Wt 70.3 kg   SpO2  96%   BMI 24.28 kg/m  Physical Exam Vitals and nursing note reviewed.  Constitutional:      General: He is not in acute distress.    Appearance: Normal appearance.  HENT:     Mouth/Throat:     Mouth: Mucous membranes are moist.  Eyes:     Conjunctiva/sclera: Conjunctivae normal.  Cardiovascular:     Rate and Rhythm: Normal rate and regular rhythm.  Pulmonary:     Effort: Pulmonary effort is normal. No respiratory distress.     Breath sounds: Normal breath sounds.  Abdominal:     General: Abdomen is flat.     Palpations: Abdomen is soft.     Tenderness: There is no abdominal tenderness.  Musculoskeletal:     Right lower leg: No edema.     Left lower leg: No edema.  Skin:    General: Skin is warm and dry.     Capillary Refill: Capillary refill takes less than 2 seconds.  Neurological:     Mental Status: He is alert and oriented to person, place, and time. Mental status is at baseline.  Psychiatric:        Mood and Affect: Mood normal.        Behavior: Behavior normal.     ED Results and Treatments Labs (all labs ordered are listed, but only abnormal results are displayed) Labs Reviewed  BASIC METABOLIC PANEL WITH GFR - Abnormal; Notable for the following components:      Result Value   BUN 24 (*)    GFR, Estimated 56 (*)    All other components within normal limits  CBC WITH DIFFERENTIAL/PLATELET - Abnormal; Notable for the following components:   RBC 3.56 (*)    Hemoglobin 11.6 (*)    HCT 36.2 (*)    MCV 101.7 (*)    All other components within normal limits  URINALYSIS, W/ REFLEX TO  CULTURE (INFECTION SUSPECTED) - Abnormal; Notable for the following components:   APPearance CLOUDY (*)    Hgb urine dipstick MODERATE (*)    Protein, ur 30 (*)    Leukocytes,Ua LARGE (*)    Bacteria, UA FEW (*)    All other components within normal limits  URINE CULTURE  BASIC METABOLIC PANEL WITH GFR  CBC                                                                                                                          Radiology No results found.  Pertinent labs & imaging results that were available during my care of the patient were reviewed by me and considered in my medical decision making (see MDM for details).  Medications Ordered in ED Medications  oxyCODONE  (Oxy IR/ROXICODONE ) immediate release tablet 5-10 mg (5 mg Oral Given 04/22/24 1941)  cefTRIAXone  (ROCEPHIN ) 2 g in sodium chloride  0.9 % 100 mL IVPB (2 g Intravenous New Bag/Given 04/22/24 2318)  abiraterone  acetate (ZYTIGA ) tablet 1,000 mg (has no administration in time range)  losartan  (COZAAR ) tablet 100 mg (has no administration in time range)  predniSONE  (DELTASONE ) tablet 5 mg (has no administration in time range)  polyethylene glycol (MIRALAX  / GLYCOLAX ) packet 17 g (has no administration in time range)  senna-docusate (Senokot-S) tablet 1 tablet (has no administration in time range)  tamsulosin  (FLOMAX ) capsule 0.4 mg (has no administration in time range)  enoxaparin  (LOVENOX ) injection 40 mg (has no administration in time range)  acetaminophen  (TYLENOL ) tablet 650 mg (has no administration in time range)    Or  acetaminophen  (TYLENOL ) suppository 650 mg (has no administration in time range)  ondansetron  (ZOFRAN ) tablet 4 mg (has no administration in time range)    Or  ondansetron  (ZOFRAN ) injection 4 mg (has no administration in time range)  Chlorhexidine  Gluconate Cloth 2 % PADS 6 each (has no administration in time range)  sodium chloride  0.9 % bolus 1,000 mL (0 mLs Intravenous Stopped 04/22/24 2201)                                                                                                                                      Procedures Procedures  (including critical care time)  Medical Decision Making / ED Course   MDM:  89 year old presenting with weakness, inability care for self.  Patient is overall well-appearing, physical examination without focal findings.  Patient is oriented and alert.  Suspect patient symptoms are due to his chronic weakness.  He denies any specific symptoms to suggest any acute process but will check basic labs, urinalysis.  It seems like patient is probably not safe to live at home anymore.  He was discharged from the hospital just yesterday but reports he was unable to take care of himself at all after leaving.  There was issues with insurance placement for SNF.  He reports that he has assets including owning his own home and owning his car and thinks that he can sell these if necessary to obtain placement.  Will consult social work following laboratory testing.  Clinical Course as of 04/22/24 7676  Sat Apr 22, 2024  2321 Urinalysis actually does show signs of urinary infection.  He also required urinary catheter placement.  Was unable to void on his own.  I have ordered ceftriaxone  to cover for UTI.  Given evidence of acute medical issue as potential cause of his weakness discussed with Dr. Charlton will admit patient. [WS]    Clinical Course User Index [WS] Francesca Elsie CROME, MD     Additional history obtained: -Additional history obtained from ems -External records from outside source obtained and reviewed including: Chart review including previous notes, labs, imaging, consultation notes including prior hospitalization notes    Lab Tests: -I ordered, reviewed, and interpreted labs.   The pertinent results include:   Labs Reviewed  BASIC METABOLIC PANEL WITH GFR - Abnormal; Notable for the following components:      Result Value   BUN  24 (*)     GFR, Estimated 56 (*)    All other components within normal limits  CBC WITH DIFFERENTIAL/PLATELET - Abnormal; Notable for the following components:   RBC 3.56 (*)    Hemoglobin 11.6 (*)    HCT 36.2 (*)    MCV 101.7 (*)    All other components within normal limits  URINALYSIS, W/ REFLEX TO CULTURE (INFECTION SUSPECTED) - Abnormal; Notable for the following components:   APPearance CLOUDY (*)    Hgb urine dipstick MODERATE (*)    Protein, ur 30 (*)    Leukocytes,Ua LARGE (*)    Bacteria, UA FEW (*)    All other components within normal limits  URINE CULTURE  BASIC METABOLIC PANEL WITH GFR  CBC    Notable for UTI    Medicines ordered and prescription drug management: Meds ordered this encounter  Medications   oxyCODONE  (Oxy IR/ROXICODONE ) immediate release tablet 5-10 mg    Refill:  0   sodium chloride  0.9 % bolus 1,000 mL   cefTRIAXone  (ROCEPHIN ) 2 g in sodium chloride  0.9 % 100 mL IVPB    Antibiotic Indication::   UTI   abiraterone  acetate (ZYTIGA ) tablet 1,000 mg   losartan  (COZAAR ) tablet 100 mg   predniSONE  (DELTASONE ) tablet 5 mg   polyethylene glycol (MIRALAX  / GLYCOLAX ) packet 17 g    Hold for diarrhea. Take when taking percocet     senna-docusate (Senokot-S) tablet 1 tablet   tamsulosin  (FLOMAX ) capsule 0.4 mg   enoxaparin  (LOVENOX ) injection 40 mg   OR Linked Order Group    acetaminophen  (TYLENOL ) tablet 650 mg    acetaminophen  (TYLENOL ) suppository 650 mg   OR Linked Order Group    ondansetron  (ZOFRAN ) tablet 4 mg    ondansetron  (ZOFRAN ) injection 4 mg   Chlorhexidine  Gluconate Cloth 2 % PADS 6 each    -I have reviewed the patients home medicines and have made adjustments as needed    Reevaluation: After the interventions noted above, I reevaluated the patient and found that their symptoms have improved  Co morbidities that complicate the patient evaluation  Past Medical History:  Diagnosis Date   B12 deficiency 12/02/2022   Constipation    Hip  pain    Hyperpigmented skin lesions 03/21/2022   Hypertension    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    Final Clinical Impression(s) / ED Diagnoses Final diagnoses:  Acute cystitis without hematuria  Urinary retention  Self-care deficit in patient living alone     This chart was dictated using voice recognition software.  Despite best efforts to proofread,  errors can occur which can change the documentation meaning.     [1]  Social History Tobacco Use   Smoking status: Never   Smokeless tobacco: Never  Vaping Use   Vaping status: Never Used  Substance Use Topics   Alcohol  use: Yes   Drug use: Never    Comment: occasional     Francesca Elsie CROME, MD 04/22/24 2323  "

## 2024-04-23 DIAGNOSIS — R338 Other retention of urine: Secondary | ICD-10-CM | POA: Diagnosis not present

## 2024-04-23 LAB — TSH: TSH: 1.53 u[IU]/mL (ref 0.350–4.500)

## 2024-04-23 LAB — VITAMIN B12: Vitamin B-12: 1700 pg/mL — ABNORMAL HIGH (ref 180–914)

## 2024-04-23 LAB — CBC
HCT: 34.4 % — ABNORMAL LOW (ref 39.0–52.0)
Hemoglobin: 11.2 g/dL — ABNORMAL LOW (ref 13.0–17.0)
MCH: 33.1 pg (ref 26.0–34.0)
MCHC: 32.6 g/dL (ref 30.0–36.0)
MCV: 101.8 fL — ABNORMAL HIGH (ref 80.0–100.0)
Platelets: 145 K/uL — ABNORMAL LOW (ref 150–400)
RBC: 3.38 MIL/uL — ABNORMAL LOW (ref 4.22–5.81)
RDW: 14.1 % (ref 11.5–15.5)
WBC: 8.9 K/uL (ref 4.0–10.5)
nRBC: 0 % (ref 0.0–0.2)

## 2024-04-23 LAB — BASIC METABOLIC PANEL WITH GFR
Anion gap: 7 (ref 5–15)
BUN: 17 mg/dL (ref 8–23)
CO2: 25 mmol/L (ref 22–32)
Calcium: 8.2 mg/dL — ABNORMAL LOW (ref 8.9–10.3)
Chloride: 105 mmol/L (ref 98–111)
Creatinine, Ser: 0.95 mg/dL (ref 0.61–1.24)
GFR, Estimated: >60 mL/min
Glucose, Bld: 95 mg/dL (ref 70–99)
Potassium: 4.7 mmol/L (ref 3.5–5.1)
Sodium: 137 mmol/L (ref 135–145)

## 2024-04-23 MED ORDER — ZOLPIDEM TARTRATE 5 MG PO TABS
5.0000 mg | ORAL_TABLET | Freq: Once | ORAL | Status: AC
  Administered 2024-04-24: 5 mg via ORAL
  Filled 2024-04-23: qty 1

## 2024-04-23 MED ORDER — POLYVINYL ALCOHOL 1.4 % OP SOLN
1.0000 [drp] | OPHTHALMIC | Status: DC | PRN
  Administered 2024-04-23 – 2024-04-24 (×3): 1 [drp] via OPHTHALMIC
  Filled 2024-04-23: qty 15

## 2024-04-23 NOTE — Progress Notes (Signed)
 " [facility] Progress Note  Patient: Alexander Rich     FMW:[id number]  DOA: 04/22/2024   PCP: Seabron Lenis, MD       Brief hospital course: This is a 89 year old male with history of duodenal ulcer, upper GI bleed, hypertension, prostate cancer metastatic to the bone who presented to the hospital fo for difficulty taking care of himself.  He was hospitalized from 11/27 through 12/19 after a fall at home.  He did not feel stable going back home to live alone however, insurance denied to pay for a skilled nursing facility.  Subjective:  He has no complaints today.  Worried again about going home alone.  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- cont Oxycodone  as needed for his back pain   BPH - Continue Flomax    Hypertension Continue losartan   Chronic thrombocytopenia  DNR       Code Status: Limited: Do not attempt resuscitation (DNR) -DNR-LIMITED -Do Not Intubate/DNI  Total time on patient care: 35 minutes DVT prophylaxis:  enoxaparin  (LOVENOX ) injection 40 mg Start: 04/23/24 1000     Objective:   Vitals:   04/22/24 2328 04/23/24 0016 04/23/24 0018 04/23/24 0345  BP:  (!) 167/74 (!) 161/66 (!) 152/76  Pulse:  (!) 57 (!) 53 75  Resp:  16  16  Temp: 99.1 F (37.3 C) 97.9 F (36.6 C)  98.1 F (36.7 C)  TempSrc:  Oral  Oral  SpO2:  98%  99%  Weight:      Height:       Filed Weights   04/22/24 1521  Weight: 70.3 kg   Exam: General exam: Appears comfortable  HEENT: oral mucosa moist Respiratory system: Clear to auscultation.     Cardiovascular system: S1 & S2 heard  Gastrointestinal system: Abdomen soft, non-tender, nondistended. Normal bowel sounds   Extremities: No cyanosis, clubbing or edema Psychiatry:  Mood & affect appropriate.      CBC: Recent Labs  Lab 04/21/24 0746 04/22/24 1646 04/23/24 0554  WBC 7.4 8.5 8.9  NEUTROABS  --  5.4  --   HGB 11.4* 11.6* 11.2*  HCT 35.8* 36.2* 34.4*  MCV 101.1* 101.7* 101.8*  PLT 183 167 145*   Basic Metabolic Panel: Recent Labs  Lab 04/21/24 0746 04/22/24 1646 04/23/24 0554  NA 138 138 137  K 4.6 4.4 4.7  CL 104 105 105  CO2 25 27 25   GLUCOSE 136* 83 95  BUN 16 24* 17  CREATININE 1.00 1.19 0.95  CALCIUM 8.8* 9.0 8.2*     Scheduled Meds:  abiraterone  acetate  1,000 mg Oral Daily   Chlorhexidine  Gluconate Cloth  6 each Topical Daily   enoxaparin  (LOVENOX ) injection  40 mg Subcutaneous Q24H   losartan   100 mg Oral q AM   predniSONE   5 mg Oral Q breakfast   senna-docusate  1 tablet Oral BID   tamsulosin   0.4 mg Oral q AM    Imaging and lab data personally reviewed   Author: Choua Chalker  04/23/2024 10:07 AM  To contact [facility]>   Check the care team  in [facility] and look for the attending/consulting [facility] provider listed  Log into [email or web address] and use [place]'s universal password   Go to> [facility]  and find provider  If you still have difficulty reaching the provider, please page the [facility] (Director on Call) for the Hospitalists listed on amion     "

## 2024-04-23 NOTE — Plan of Care (Signed)
" °  Problem: Education: Goal: Knowledge of General Education information will improve Description: Including pain rating scale, medication(s)/side effects and non-pharmacologic comfort measures Outcome: Progressing   Problem: Health Behavior/Discharge Planning: Goal: Ability to manage health-related needs will improve Outcome: Progressing   Problem: Clinical Measurements: Goal: Ability to maintain clinical measurements within normal limits will improve Outcome: Progressing Goal: Will remain free from infection Outcome: Progressing Goal: Diagnostic test results will improve Outcome: Progressing Goal: Respiratory complications will improve Outcome: Progressing Goal: Cardiovascular complication will be avoided Outcome: Progressing   Problem: Activity: Goal: Risk for activity intolerance will decrease Outcome: Progressing   Problem: Pain Managment: Goal: General experience of comfort will improve and/or be controlled Outcome: Progressing   Problem: Elimination: Goal: Will not experience complications related to bowel motility Outcome: Progressing Goal: Will not experience complications related to urinary retention Outcome: Progressing   Problem: Safety: Goal: Ability to remain free from injury will improve Outcome: Progressing   "

## 2024-04-23 NOTE — Plan of Care (Signed)
  Problem: Coping: Goal: Level of anxiety will decrease Outcome: Progressing   Problem: Pain Managment: Goal: General experience of comfort will improve and/or be controlled Outcome: Progressing   Problem: Safety: Goal: Ability to remain free from injury will improve Outcome: Progressing   Problem: Skin Integrity: Goal: Risk for impaired skin integrity will decrease Outcome: Progressing

## 2024-04-24 ENCOUNTER — Other Ambulatory Visit: Payer: Self-pay

## 2024-04-24 DIAGNOSIS — R338 Other retention of urine: Secondary | ICD-10-CM | POA: Diagnosis not present

## 2024-04-24 MED ORDER — MUPIROCIN 2 % EX OINT
TOPICAL_OINTMENT | Freq: Every day | CUTANEOUS | Status: DC
  Administered 2024-04-30 – 2024-05-01 (×2): 1 via TOPICAL
  Filled 2024-04-24: qty 22

## 2024-04-24 NOTE — Progress Notes (Signed)
 " [facility] Progress Note  Patient: Alexander Rich     FMW:[id number]  DOA: 04/22/2024   PCP: Seabron Lenis, MD       Brief hospital course: This is a 89 year old male with history of duodenal ulcer, upper GI bleed, hypertension, prostate cancer metastatic to the bone who presented to the hospital fo for difficulty taking care of himself.  He was hospitalized from 11/27 through 12/19 after a fall at home.  He did not feel stable going back home to live alone however, insurance denied to pay for a skilled nursing facility.  Subjective:  He has no complaints.  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1 C) 97.7 F (36.5 C) 97.8 F (36.6 C)  TempSrc: Oral Oral Oral Oral  SpO2: 100% 96% 97% 100%  Weight:      Height:       Filed Weights   04/22/24 1521  Weight: 70.3 kg   Exam: General exam: Appears comfortable  HEENT: oral mucosa moist Respiratory  system: Clear to auscultation.    Cardiovascular system: S1 & S2 heard  Gastrointestinal system: Abdomen soft, non-tender, nondistended. Normal bowel sounds   Extremities: No cyanosis, clubbing or edema Psychiatry:  Mood & affect appropriate.      CBC: Recent Labs  Lab 04/21/24 0746 04/22/24 1646 04/23/24 0554  WBC 7.4 8.5 8.9  NEUTROABS  --  5.4  --   HGB 11.4* 11.6* 11.2*  HCT 35.8* 36.2* 34.4*  MCV 101.1* 101.7* 101.8*  PLT 183 167 145*   Basic Metabolic Panel: Recent Labs  Lab 04/21/24 0746 04/22/24 1646 04/23/24 0554  NA 138 138 137  K 4.6 4.4 4.7  CL 104 105 105  CO2 25 27 25   GLUCOSE 136* 83 95  BUN 16 24* 17  CREATININE 1.00 1.19 0.95  CALCIUM 8.8* 9.0 8.2*     Scheduled Meds:  abiraterone  acetate  1,000 mg Oral Daily   Chlorhexidine  Gluconate Cloth  6 each Topical Daily   enoxaparin  (LOVENOX ) injection  40 mg Subcutaneous Q24H   losartan   100 mg Oral q AM   mupirocin  ointment   Topical Daily   predniSONE   5 mg Oral Q breakfast   senna-docusate  1 tablet Oral BID   tamsulosin   0.4 mg Oral q AM    Imaging and lab data personally reviewed   Author: [employer]  04/24/2024 2:23  PM  To contact [facility]>   Check the care team in [facility] and look for the attending/consulting [facility] provider listed  Log into [email or web address] and use [place]'s universal password   Go to> [facility]  and find provider  If you still have difficulty reaching the provider, please page the [facility] (Director on Call) for the Hospitalists listed on amion     "

## 2024-04-24 NOTE — Consult Note (Signed)
 WOC Nurse Consult Note: Reason for Consult: L hand skin tear  Wound type: full thickness L hand x 2 separate areas trauma  Pressure Injury POA: NA not pressure  Measurement: see nursing flowsheet  Wound bed: pink moist  Drainage (amount, consistency, odor) serosanguinous  Periwound: ecchymosis  Dressing procedure/placement/frequency: Cleanse L hand skin tears with NS, apply Mupirocin  to wound beds daily then cover with Vaseline gauze, dry gauze and secure with silicone foam or Kerlix roll gauze whichever works best.   POC discussed with bedside nurse. WOC team will not follow. Reconsult if further needs arise.   Thank you,    Powell Bar MSN, RN-BC, [employer]

## 2024-04-24 NOTE — Care Management Obs Status (Signed)
 MEDICARE OBSERVATION STATUS NOTIFICATION   Patient Details  Name: Alexander Rich MRN: [id number] Date of Birth: [birth year]   Medicare Observation Status Notification Given:  Yes    Toy LITTIE Agar, RN 04/24/2024, 12:02 PM

## 2024-04-24 NOTE — TOC Initial Note (Signed)
 Transition of Care [facility]) - Initial/Assessment Note    Patient Details  Name: Alexander ETSITTY MRN: [id number] Date of Birth: 1926-03-19  Transition of Care [facility]) CM/SW Contact:    Toy LITTIE Agar, RN Phone Number:[phone]  04/24/2024, 4:18 PM  Clinical Narrative:                 Inpatient care manager following patient from home where he states that he was unable to care for himself. Patient verbalized that he needs assistance in finding a safe discharge plan. CM discussed options for care and requested permission to consult [facility]. Patient is agreeable. Cm spoke with Adina at [employer] for referral.   Adina at hospital to see patient and called niece [name] , when speaking to niece she states that decision making has been turned over to [employer]. Matt called this CM to update. Matt to hold off on referral until CM can determine decision maker and establish consent to make decisions.   CM at bedside to discuss decision maker with patient. Patient states that his niece Valentin will assist in decision making and that he does not know who Mardy Daring is. Patient does not give consent to discuss care with Alyssa brown.   CM spoke with [name] and [name] Mills via phone, both states that Mardy Daring is Jim's daughter and that they have turned everything over to her. CM explains that decision making and POA can not just be handed over and that patient will need to consent. CM has explained that for now CM will need to communicate with Clara for decision making.   Expected Discharge Plan: Long Term Nursing Home (Long term care level of care to be determined) Barriers to Discharge: Other (must enter comment) (patient unable to care for himself)   Patient Goals and CMS Choice Patient states their goals for this hospitalization and ongoing recovery are:: Wants to find somewhere that he can get long term care from. CMS Medicare.gov Compare Post Acute Care  list provided to:: Patient        Expected Discharge Plan and Services In-house Referral: NA Discharge Planning Services: CM Consult   Living arrangements for the past 2 months: Single Family Home                 DME Arranged: N/A DME Agency: NA       HH Arranged: NA HH Agency: NA        Prior Living Arrangements/Services Living arrangements for the past 2 months: Single Family Home Lives with:: Self Patient language and need for interpreter reviewed:: Yes Do you feel safe going back to the place where you live?: No (Patient states that he is unable to care for himself.)   Patient states that he is unable to care for himself.  Need for Family Participation in Patient Care: Yes (Comment) Care giver support system in place?: No (comment) Current home services: Home RN, Home RT, Home PT, Home OT (Services initiated with [facility] on last admission) Criminal Activity/Legal Involvement Pertinent to Current Situation/Hospitalization: No - Comment as needed  Activities of Daily Living   ADL Screening (condition at time of admission) Independently performs ADLs?: No Does the patient have a NEW difficulty with bathing/dressing/toileting/self-feeding that is expected to last >3 days?: Yes (Initiates electronic notice to provider for possible OT consult) Does the patient have a NEW difficulty with getting in/out of bed, walking, or climbing stairs that is expected to last >3 days?: Yes (Initiates  electronic notice to provider for possible PT consult) Does the patient have a NEW difficulty with communication that is expected to last >3 days?: Yes (Initiates electronic notice to provider for possible SLP consult) Is the patient deaf or have difficulty hearing?: No Does the patient have difficulty seeing, even when wearing glasses/contacts?: No Does the patient have difficulty concentrating, remembering, or making decisions?: Yes  Permission Sought/Granted Permission sought to share  information with : Case Manager, Family Supports Permission granted to share information with : Yes, Release of Information Signed  Share Information with NAME: [name]     Permission granted to share info w Relationship: niece  Permission granted to share info w Contact Information: [phone]  Emotional Assessment Appearance:: Appears stated age Attitude/Demeanor/Rapport: Gracious Affect (typically observed): Overwhelmed, Pleasant Orientation: : Oriented to Self, Oriented to Place, Oriented to  Time, Oriented to Situation Alcohol  / Substance Use: Not Applicable Psych Involvement: No (comment)  Admission diagnosis:  Urinary retention [R33.9] Acute cystitis without hematuria [N30.00] Acute urinary retention [R33.8] Self-care deficit in patient living alone [Z60.2] Patient Active Problem List   Diagnosis Date Noted   Failure to thrive in adult 04/22/2024   Acute urinary retention 04/19/2024   Palliative care by specialist 04/05/2024   Goals of care, counseling/discussion 04/05/2024   Counseling and coordination of care 04/05/2024   DNR (do not resuscitate) 04/05/2024   Rhabdomyolysis 03/30/2024   Macrocytosis 03/30/2024   Inadequate oral intake 03/30/2024   Duodenal ulcer 03/30/2024   Cardiac arrhythmia 03/30/2024   Chronic idiopathic constipation 03/30/2024   Mixed hyperlipidemia 03/30/2024   Odynophagia 03/30/2024   Vitamin D deficiency 03/30/2024   Falls 03/30/2024   B12 deficiency 12/02/2022   Fall at home, initial encounter 11/03/2022   COVID-19 virus infection 11/03/2022   Dehydration 11/03/2022   Acute prerenal azotemia 11/03/2022   General weakness 11/02/2022   Sigmoid thickening 07/18/2022   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   Prostate enlargement 07/16/2022   Bilateral lower extremity edema 07/14/2022   History of duodenal ulcer 03/25/2022   Primary localized osteoarthritis of left hip 03/24/2022   Benign prostatic hyperplasia  without urinary obstruction 03/22/2022   Sinus bradycardia 03/22/2022   Acute upper gastrointestinal bleeding 03/21/2022   Leukocytosis 03/21/2022   Macrocytic anemia 03/21/2022   Essential hypertension 03/21/2022   Hyperpigmented skin lesions 03/21/2022   Thrombocytopenia 03/21/2022   Pain of left hip joint 11/17/2021   Osteoarthritis of knee 02/17/2021   PCP:  Seabron Lenis, MD Pharmacy:   [facility] [phone] [place], [place] - [address] AT [facility] [address] & [facility] [address] [place] [address] Phone: [phone] Fax: [phone]     Social Drivers of Health (SDOH) Social History: SDOH Screenings   Food Insecurity: Food Insecurity Present (04/23/2024)  Housing: Low Risk (04/23/2024)  Transportation Needs: No Transportation Needs (04/23/2024)  Utilities: Not At Risk (04/23/2024)  Depression (PHQ2-9): Low Risk (01/05/2024)  Social Connections: Moderately Isolated (04/23/2024)  Tobacco Use: Low Risk (04/22/2024)   SDOH Interventions:     Readmission Risk Interventions    11/04/2022    1:30 PM  Readmission Risk Prevention Plan  Transportation Screening Complete  PCP or Specialist Appt within 3-5 Days Complete  HRI or Home Care Consult Complete  Social Work Consult for Recovery Care Planning/Counseling Complete  Palliative Care Screening Not Applicable  Medication Review Oceanographer) Complete

## 2024-04-25 ENCOUNTER — Other Ambulatory Visit: Payer: Self-pay

## 2024-04-25 ENCOUNTER — Observation Stay (HOSPITAL_COMMUNITY)

## 2024-04-25 DIAGNOSIS — R338 Other retention of urine: Secondary | ICD-10-CM | POA: Diagnosis not present

## 2024-04-25 LAB — URINE CULTURE: Culture: 60000 — AB

## 2024-04-25 NOTE — Plan of Care (Signed)
   Problem: Activity: Goal: Risk for activity intolerance will decrease Outcome: Progressing   Problem: Nutrition: Goal: Adequate nutrition will be maintained Outcome: Progressing   Problem: Coping: Goal: Level of anxiety will decrease Outcome: Progressing

## 2024-04-25 NOTE — Plan of Care (Signed)

## 2024-04-25 NOTE — TOC Progression Note (Addendum)
 Transition of Care [facility]) - Progression Note    Patient Details  Name: Alexander Rich MRN: [id number] Date of Birth: November 22, 1925  Transition of Care [facility]) CM/SW Contact  Toy LITTIE Agar, RN Phone Number:[phone] 04/25/2024, 8:34 AM  Clinical Narrative:    CM received message from [place] with [facility] stating that nurse from [facility] will be here today for evaluation for assisted living.    Expected Discharge Plan: Long Term Nursing Home (Long term care level of care to be determined) Barriers to Discharge: Other (must enter comment) (patient unable to care for himself)               Expected Discharge Plan and Services In-house Referral: NA Discharge Planning Services: CM Consult   Living arrangements for the past 2 months: Single Family Home                 DME Arranged: N/A DME Agency: NA       HH Arranged: NA HH Agency: NA         Social Drivers of Health (SDOH) Interventions SDOH Screenings   Food Insecurity: Food Insecurity Present (04/23/2024)  Housing: Low Risk (04/23/2024)  Transportation Needs: No Transportation Needs (04/23/2024)  Utilities: Not At Risk (04/23/2024)  Depression (PHQ2-9): Low Risk (01/05/2024)  Social Connections: Moderately Isolated (04/23/2024)  Tobacco Use: Low Risk (04/22/2024)    Readmission Risk Interventions    11/04/2022    1:30 PM  Readmission Risk Prevention Plan  Transportation Screening Complete  PCP or Specialist Appt within 3-5 Days Complete  HRI or Home Care Consult Complete  Social Work Consult for Recovery Care Planning/Counseling Complete  Palliative Care Screening Not Applicable  Medication Review Oceanographer) Complete

## 2024-04-25 NOTE — Progress Notes (Signed)
" °   04/25/24 1535  Spiritual Encounters  Type of Visit Attempt (pt unavailable)   Per referral from [facility], I attempted to provide spiritual care support to Alexander Rich. He was sleeping soundly at this time. Will attempt to follow up, please page if urgent support needed.  Kaylamarie Swickard L. Delores HERO.Div "

## 2024-04-25 NOTE — Progress Notes (Signed)
 " [facility] Progress Note  Patient: Alexander Rich     FMW:[id number]  DOA: 04/22/2024   PCP: Seabron Lenis, MD       Brief hospital course: This is a 89 year old male with history of duodenal ulcer, upper GI bleed, hypertension, prostate cancer metastatic to the bone who presented to the hospital fo for difficulty taking care of himself.  He was hospitalized from 11/27 through 12/19 after a fall at home.  He did not feel stable going back home to live alone however, insurance denied to pay for a skilled nursing facility.  Subjective:  He is complaining of hopelessness. He is unhappy about being in the hospital and feel that his niece's husband has been spreading rumors about him to the nursing staff and now the staff is behaving rudely with him.   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  - seems he is getting depressed- follow- hold off on starting medications  DNR       Code Status: Limited: Do not attempt resuscitation (DNR) -DNR-LIMITED -Do Not Intubate/DNI  Total time on patient care: 35 minutes DVT prophylaxis:  enoxaparin  (LOVENOX ) injection 40 mg Start: 04/23/24 1000     Objective:   Vitals:   04/24/24 1052 04/24/24 1504 04/24/24 2031 04/25/24 0515  BP: 119/62 129/64 129/62 (!) 158/71  Pulse: 72 73 75 (!) 54  Resp: 16 15 14 14   Temp: 97.8 F  (36.6 C) 97.7 F (36.5 C) 98.1 F (36.7 C) 98.9 F (37.2 C)  TempSrc: Oral Oral Oral Oral  SpO2: 100% 99% 99% 93%  Weight:      Height:       Filed Weights   04/22/24 1521  Weight: 70.3 kg   Exam: General exam: Appears comfortable  HEENT: oral mucosa moist Respiratory system: Clear to auscultation.    Cardiovascular system: S1 & S2 heard  Gastrointestinal system: Abdomen soft, non-tender, nondistended. Normal bowel sounds   Extremities: No cyanosis, clubbing or edema Psychiatry:  Mood & affect appropriate.      CBC: Recent Labs  Lab 04/21/24 0746 04/22/24 1646 04/23/24 0554  WBC 7.4 8.5 8.9  NEUTROABS  --  5.4  --   HGB 11.4* 11.6* 11.2*  HCT 35.8* 36.2* 34.4*  MCV 101.1* 101.7* 101.8*  PLT 183 167 145*   Basic Metabolic Panel: Recent Labs  Lab 04/21/24 0746 04/22/24 1646 04/23/24 0554  NA 138 138 137  K 4.6 4.4 4.7  CL 104 105 105  CO2 25 27 25   GLUCOSE 136* 83 95  BUN 16 24* 17  CREATININE 1.00 1.19 0.95  CALCIUM 8.8* 9.0 8.2*     Scheduled Meds:  abiraterone  acetate  1,000 mg Oral Daily   Chlorhexidine  Gluconate Cloth  6 each Topical Daily   enoxaparin  (LOVENOX ) injection  40 mg Subcutaneous Q24H   losartan   100 mg Oral q  AM   mupirocin  ointment   Topical Daily   predniSONE   5 mg Oral Q breakfast   senna-docusate  1 tablet Oral BID   tamsulosin   0.4 mg Oral q AM    Imaging and lab data personally reviewed   Author: Nazair Fortenberry  04/25/2024 12:23 PM  To contact [facility]>   Check the care team in [facility] and look for the attending/consulting TRH provider listed  Log into [email or web address] and use [facility] universal password   Go to> [facility]  and find provider  If you still have difficulty reaching the provider, please page the [facility] (Director on Call) for the Hospitalists listed on amion     "

## 2024-04-25 NOTE — TOC Progression Note (Signed)
 Transition of Care [facility]) - Progression Note    Patient Details  Name: Alexander Rich MRN: [id number] Date of Birth: [birth year]  Transition [facility] Care [facility]) CM/SW Contact  Toy LITTIE Agar, RN Phone Number:[phone]  04/25/2024, 1:33 PM  Clinical Narrative:    Cm at bedside to discuss disposition planning with patient. Patient is agreeable to proceeding with Assisted Living  placement. Patient confirms with CM that representative from [facility] came to assess patient. Patient states that he is in agreement and definitely wants to move forward with placement at assisted living facility.   Harlene and Adina with [employer] [phone] to follow for assistance with placement. Harlene has requested updated FL2 abd xray tb test. Md. aware of xray and has ordered and FL2 has been updated and emailed to [place].    Expected Discharge Plan: Long Term Nursing Home (Long term care level of care to be determined) Barriers to Discharge: Other (must enter comment) (patient unable to care for himself)               Expected Discharge Plan and Services In-house Referral: NA Discharge Planning Services: CM Consult   Living arrangements for the past 2 months: Single Family Home                 DME Arranged: N/A DME Agency: NA       HH Arranged: NA HH Agency: NA         Social Drivers of Health (SDOH) Interventions SDOH Screenings   Food Insecurity: Food Insecurity Present (04/23/2024)  Housing: Low Risk (04/23/2024)  Transportation Needs: No Transportation Needs (04/23/2024)  Utilities: Not At Risk (04/23/2024)  Depression (PHQ2-9): Low Risk (01/05/2024)  Social Connections: Moderately Isolated (04/23/2024)  Tobacco Use: Low Risk (04/22/2024)    Readmission Risk Interventions    11/04/2022    1:30 PM  Readmission Risk Prevention Plan  Transportation Screening Complete  PCP or Specialist Appt within 3-5 Days Complete  HRI or Home Care Consult  Complete  Social Work Consult for Recovery Care Planning/Counseling Complete  Palliative Care Screening Not Applicable  Medication Review Oceanographer) Complete

## 2024-04-25 NOTE — [place] FL2 (Signed)
 " [place]  MEDICAID FL2 LEVEL OF CARE FORM     IDENTIFICATION  Patient Name: Alexander Rich Birthdate: 1926-01-05 Sex: male Admission Date (Current Location): 04/22/2024  [facility] and [place] Number:  Producer, Television/film/video and Address:         Provider Number:    Attending Physician Name and Address:  Earley Saucer, MD  Relative Name and Phone Number:  [name] [phone]    Current Level of Care: Hospital Recommended Level of Care: Assisted Living Facility Prior Approval Number:    Date Approved/Denied:   PASRR Number: [id number]  Discharge Plan: Other (Comment)    Current Diagnoses: Patient Active Problem List   Diagnosis Date Noted   Failure to thrive in adult 04/22/2024   Acute urinary retention 04/19/2024   Palliative care by specialist 04/05/2024   Goals of care, counseling/discussion 04/05/2024   Counseling and coordination of care 04/05/2024   DNR (do not resuscitate) 04/05/2024   Rhabdomyolysis 03/30/2024   Macrocytosis 03/30/2024   Inadequate oral intake 03/30/2024   Duodenal ulcer 03/30/2024   Cardiac arrhythmia 03/30/2024   Chronic idiopathic constipation 03/30/2024   Mixed hyperlipidemia 03/30/2024   Odynophagia 03/30/2024   Vitamin D deficiency 03/30/2024   Falls 03/30/2024   B12 deficiency 12/02/2022   Fall at home, initial encounter 11/03/2022   COVID-19 virus infection 11/03/2022   Dehydration 11/03/2022   Acute prerenal azotemia 11/03/2022   General weakness 11/02/2022   Sigmoid thickening 07/18/2022   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   Prostate enlargement 07/16/2022   Bilateral lower extremity edema 07/14/2022   History of duodenal ulcer 03/25/2022   Primary localized osteoarthritis of left hip 03/24/2022   Benign prostatic hyperplasia without urinary obstruction 03/22/2022   Sinus bradycardia 03/22/2022   Acute upper gastrointestinal bleeding 03/21/2022   Leukocytosis 03/21/2022   Macrocytic anemia  03/21/2022   Essential hypertension 03/21/2022   Hyperpigmented skin lesions 03/21/2022   Thrombocytopenia 03/21/2022   Pain of left hip joint 11/17/2021   Osteoarthritis of knee 02/17/2021    Orientation RESPIRATION BLADDER Height & Weight     Self, Time, Situation, Place  Normal Indwelling catheter (temporary) Weight: 70.3 kg Height:  5' 7 (170.2 cm)  BEHAVIORAL SYMPTOMS/MOOD NEUROLOGICAL BOWEL NUTRITION STATUS     (n/a) Continent Diet (Regular)  AMBULATORY STATUS COMMUNICATION OF NEEDS Skin   Limited Assist Verbally Other (Comment) (redness noted to hand & finger bilateral, abrasions left arm, ecchymosis bilateral buttocks/ arms, left hand post traumatic injury foam dressing intact. (skin tears))                       Personal Care Assistance Level of Assistance  Bathing, Feeding, Dressing, Total care Bathing Assistance: Limited assistance Feeding assistance: Limited assistance Dressing Assistance: Limited assistance Total Care Assistance: Limited assistance   Functional Limitations Info  Sight, Hearing, Speech Sight Info: Impaired (eyeglasses) Hearing Info: Impaired (Impaired) Speech Info: Adequate    SPECIAL CARE FACTORS FREQUENCY  PT (By licensed PT), OT (By licensed OT)     PT Frequency: 3x/wk OT Frequency: 3x/wk            Contractures Contractures Info: Not present    Additional Factors Info  Code Status, Allergies, Psychotropic, Insulin Sliding Scale, Isolation Precautions, Suctioning Needs Code Status Info: Full Allergies Info: Ambien  (Zolpidem ), Ibuprofen, Nebivolol Hcl, Pravastatin, Sertraline Hcl, Simvastatin, Trazodone Psychotropic Info: n/a see dicharge summary Insulin Sliding Scale Info: see discharge summary Isolation Precautions Info: n/a Suctioning Needs: n/a  Current Medications (04/25/2024):  This is the current hospital active medication list Current Facility-Administered Medications  Medication Dose Route Frequency Provider Last  Rate Last Admin   abiraterone  acetate (ZYTIGA ) tablet 1,000 mg  1,000 mg Oral Daily Opyd, Timothy S, MD       acetaminophen  (TYLENOL ) tablet 650 mg  650 mg Oral Q6H PRN Opyd, Timothy S, MD       Or   acetaminophen  (TYLENOL ) suppository 650 mg  650 mg Rectal Q6H PRN Opyd, Timothy S, MD       artificial tears ophthalmic solution 1 drop  1 drop Both Eyes PRN Opyd, Evalene RAMAN, MD   1 drop at 04/24/24 1109   Chlorhexidine  Gluconate Cloth 2 % PADS 6 each  6 each Topical Daily Opyd, Timothy S, MD   6 each at 04/25/24 9143   enoxaparin  (LOVENOX ) injection 40 mg  40 mg Subcutaneous Q24H Opyd, Timothy S, MD   40 mg at 04/25/24 9144   losartan  (COZAAR ) tablet 100 mg  100 mg Oral q AM Opyd, Timothy S, MD   100 mg at 04/25/24 9144   mupirocin  ointment (BACTROBAN ) 2 %   Topical Daily Rizwan, Saima, MD   Given at 04/25/24 9143   ondansetron  (ZOFRAN ) tablet 4 mg  4 mg Oral Q6H PRN Opyd, Timothy S, MD       Or   ondansetron  (ZOFRAN ) injection 4 mg  4 mg Intravenous Q6H PRN Opyd, Timothy S, MD   4 mg at 04/23/24 1817   oxyCODONE  (Oxy IR/ROXICODONE ) immediate release tablet 5-10 mg  5-10 mg Oral Q6H PRN Francesca Elsie CROME, MD   10 mg at 04/25/24 0855   polyethylene glycol (MIRALAX  / GLYCOLAX ) packet 17 g  17 g Oral Daily PRN Opyd, Timothy S, MD       predniSONE  (DELTASONE ) tablet 5 mg  5 mg Oral Q breakfast Opyd, Timothy S, MD   5 mg at 04/25/24 0855   senna-docusate (Senokot-S) tablet 1 tablet  1 tablet Oral BID Opyd, Timothy S, MD   1 tablet at 04/25/24 9144   tamsulosin  (FLOMAX ) capsule 0.4 mg  0.4 mg Oral q AM Opyd, Timothy S, MD   0.4 mg at 04/25/24 9144     Discharge Medications: Please see discharge summary for a list of discharge medications.  Relevant Imaging Results:  Relevant Lab Results:   Additional Information SS# [id number]  Toy CROME Agar, RN     "

## 2024-04-26 DIAGNOSIS — R338 Other retention of urine: Secondary | ICD-10-CM | POA: Diagnosis not present

## 2024-04-26 MED ORDER — TUBERCULIN PPD 5 UNIT/0.1ML ID SOLN
5.0000 [IU] | Freq: Once | INTRADERMAL | Status: AC
  Administered 2024-04-26: 5 [IU] via INTRADERMAL
  Filled 2024-04-26: qty 0.1

## 2024-04-26 MED ORDER — ORAL CARE MOUTH RINSE: 15.0000 mL | OROMUCOSAL | Status: DC | PRN

## 2024-04-26 NOTE — Plan of Care (Signed)
  Problem: Clinical Measurements: Goal: Will remain free from infection Outcome: Progressing Goal: Respiratory complications will improve Outcome: Progressing Goal: Cardiovascular complication will be avoided Outcome: Progressing   Problem: Coping: Goal: Level of anxiety will decrease Outcome: Progressing

## 2024-04-26 NOTE — TOC Progression Note (Addendum)
 Transition of Care [facility]) - Progression Note    Patient Details  Name: Alexander Rich MRN: [id number] Date of Birth: [birth year]  Transition of Care [facility]) CM/SW Contact  Heather DELENA Saltness, LCSW Phone Number: 04/26/2024, 11:14 AM  Clinical Narrative:     ADDENDUM  2:31 PM - TB test ordered. [facility] FL2 Addendum is requiring immunization records and medication administration records, needing to be filled out and signed by pt's PCP. Alan reports she will obtain pt's PCP, Alm Rav, MD, signature. Once TB test has resulted, it can be emailed to [facility] via secure email at [email or web address].   12:32 PM - CSW spoke with Alan Mare, via phone call to inquire about need for hospital bed. Alan reports [facility] does not require hospital bed for pt, however, [employer] recommended hospital bed. Per chart review, PT recommending no DME at this time.  11:24 AM - CSW spoke with Alan Mare [phone], sales rep, from [facility]. [facility] able to accept pt for LTC. [facility] needing FL2, TB test results, PT/OT orders, and hospital bed prior to admission. Tentative admission, pending the above, on Monday 12/29. TB test ordered by MD. MD and RN made aware.  11:08 AM - CSW received voicemail from Alan Mare [phone], sales rep, from [facility], regarding pt. CSW attempted to return phone call, but no answer. Voicemail left requesting return phone call. TOC will continue to follow.   Expected Discharge Plan: Long Term Nursing Home (Long term care level of care to be determined) Barriers to Discharge: Other (must enter comment) (patient unable to care for himself)  Expected Discharge Plan and Services In-house Referral: NA Discharge Planning Services: CM Consult   Living arrangements for the past 2 months: Single Family Home                 DME Arranged: N/A DME Agency: NA       HH Arranged: NA HH Agency: NA          Social Drivers of Health (SDOH) Interventions SDOH Screenings   Food Insecurity: Food Insecurity Present (04/23/2024)  Housing: Low Risk (04/23/2024)  Transportation Needs: No Transportation Needs (04/23/2024)  Utilities: Not At Risk (04/23/2024)  Depression (PHQ2-9): Low Risk (01/05/2024)  Social Connections: Moderately Isolated (04/23/2024)  Tobacco Use: Low Risk (04/22/2024)    Readmission Risk Interventions    11/04/2022    1:30 PM  Readmission Risk Prevention Plan  Transportation Screening Complete  PCP or Specialist Appt within 3-5 Days Complete  HRI or Home Care Consult Complete  Social Work Consult for Recovery Care Planning/Counseling Complete  Palliative Care Screening Not Applicable  Medication Review Oceanographer) Complete   Signed: Heather Saltness, MSW, LCSW Clinical Social Worker Inpatient Care Management 04/26/2024 11:23 AM

## 2024-04-26 NOTE — Progress Notes (Addendum)
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 He was hospitalized from 11/27 through 12/19 after a fall at home.  He did not feel stable going back home to live alone however, insurance denied to pay for a skilled nursing facility.  Subjective:  Noted to be sleeping, easily arousable.  Denied any new complaints.  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  Delirium precautions       Code Status: Limited: Do not attempt resuscitation (DNR) -DNR-LIMITED -Do Not Intubate/DNI   DVT prophylaxis:  enoxaparin  (LOVENOX ) injection 40 mg Start: 04/23/24 1000     Objective:   Vitals:   04/25/24 1340 04/25/24 2015 04/26/24 0529 04/26/24 1318  BP: 131/71 (!) 142/72 (!) 144/70 139/79  Pulse: 90 80 77 72  Resp: 18 17 15 18   Temp:  98.5 F (36.9 C) 98.5 F (36.9 C) 98.6 F (37 C)  TempSrc:  Oral Oral Oral  SpO2: 98% 100% 97% 99%  Weight:      Height:       Filed Weights   04/22/24 1521  Weight: 70.3 kg   Exam: General: NAD  Cardiovascular: S1, S2 present Respiratory: CTAB Abdomen: Soft, nontender, nondistended, bowel sounds present Musculoskeletal: No bilateral pedal edema noted Skin: Normal Psychiatry: Normal mood     CBC: Recent Labs  Lab  04/21/24 0746 04/22/24 1646 04/23/24 0554  WBC 7.4 8.5 8.9  NEUTROABS  --  5.4  --   HGB 11.4* 11.6* 11.2*  HCT 35.8* 36.2* 34.4*  MCV 101.1* 101.7* 101.8*  PLT 183 167 145*   Basic Metabolic Panel: Recent Labs  Lab 04/21/24 0746 04/22/24 1646 04/23/24 0554  NA 138 138 137  K 4.6 4.4 4.7  CL 104 105 105  CO2 25 27 25   GLUCOSE 136* 83 95  BUN 16 24* 17  CREATININE 1.00 1.19 0.95  CALCIUM 8.8* 9.0 8.2*     Scheduled Meds:  abiraterone  acetate  1,000 mg Oral Daily   Chlorhexidine  Gluconate Cloth  6 each Topical Daily   enoxaparin  (LOVENOX ) injection  40 mg Subcutaneous Q24H   losartan   100 mg Oral q AM   mupirocin  ointment   Topical Daily   predniSONE   5 mg Oral Q breakfast   senna-docusate  1 tablet Oral BID   tamsulosin   0.4 mg Oral q AM   tuberculin  5 Units Intradermal Once       Author: Lebron JINNY Cage, MD 04/26/2024 5:24 PM      "

## 2024-04-27 DIAGNOSIS — R338 Other retention of urine: Secondary | ICD-10-CM | POA: Diagnosis not present

## 2024-04-27 NOTE — Plan of Care (Signed)
  Problem: Coping: Goal: Level of anxiety will decrease Outcome: Progressing   Problem: Pain Managment: Goal: General experience of comfort will improve and/or be controlled Outcome: Progressing   Problem: Safety: Goal: Ability to remain free from injury will improve Outcome: Progressing   Problem: Skin Integrity: Goal: Risk for impaired skin integrity will decrease Outcome: Progressing

## 2024-04-27 NOTE — Progress Notes (Signed)
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 He was hospitalized from 11/27 through 12/19 after a fall at home.  He did not feel stable going back home to live alone however, insurance denied to pay for a skilled nursing facility.  Subjective:  Denied any new complaints.  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  Delirium precautions       Code Status: Limited: Do not attempt resuscitation (DNR) -DNR-LIMITED -Do Not Intubate/DNI   DVT prophylaxis:  enoxaparin  (LOVENOX ) injection 40 mg Start: 04/23/24 1000     Objective:   Vitals:   04/26/24 1318 04/26/24 2026 04/27/24 0541 04/27/24 1420  BP: 139/79 (!) 143/68 (!) 131/98 (!) 146/86  Pulse: 72 60 75 81  Resp: 18 14 14 18   Temp: 98.6 F (37 C) 97.7 F (36.5 C) 97.8 F (36.6 C) 98.2 F (36.8 C)  TempSrc: Oral Oral Oral Oral  SpO2: 99% 99% 100% 97%  Weight:      Height:       Filed Weights   04/22/24 1521  Weight: 70.3 kg   Exam: General: NAD  Cardiovascular: S1, S2 present Respiratory: CTAB Abdomen: Soft, nontender, nondistended, bowel sounds present Musculoskeletal: No bilateral pedal edema noted Skin: Normal Psychiatry: Normal mood     CBC: Recent Labs  Lab 04/21/24 0746  04/22/24 1646 04/23/24 0554  WBC 7.4 8.5 8.9  NEUTROABS  --  5.4  --   HGB 11.4* 11.6* 11.2*  HCT 35.8* 36.2* 34.4*  MCV 101.1* 101.7* 101.8*  PLT 183 167 145*   Basic Metabolic Panel: Recent Labs  Lab 04/21/24 0746 04/22/24 1646 04/23/24 0554  NA 138 138 137  K 4.6 4.4 4.7  CL 104 105 105  CO2 25 27 25   GLUCOSE 136* 83 95  BUN 16 24* 17  CREATININE 1.00 1.19 0.95  CALCIUM 8.8* 9.0 8.2*     Scheduled Meds:  abiraterone  acetate  1,000 mg Oral Daily   Chlorhexidine  Gluconate Cloth  6 each Topical Daily   enoxaparin  (LOVENOX ) injection  40 mg Subcutaneous Q24H   losartan   100 mg Oral q AM   mupirocin  ointment   Topical Daily   predniSONE   5 mg Oral Q breakfast   senna-docusate  1 tablet Oral BID   tamsulosin   0.4 mg Oral q AM   tuberculin  5 Units Intradermal Once       Author: Lebron JINNY Cage, MD 04/27/2024 3:04 PM      "

## 2024-04-27 NOTE — Plan of Care (Signed)

## 2024-04-28 DIAGNOSIS — R338 Other retention of urine: Secondary | ICD-10-CM | POA: Diagnosis not present

## 2024-04-28 MED ORDER — LUBRIDERM SERIOUSLY SENSITIVE EX LOTN: 1.0000 | TOPICAL_LOTION | Freq: Three times a day (TID) | CUTANEOUS | Status: DC | PRN

## 2024-04-28 NOTE — Plan of Care (Signed)

## 2024-04-28 NOTE — TOC Progression Note (Addendum)
 Transition of Care [facility]) - Progression Note    Patient Details  Name: Alexander Rich MRN: [id number] Date of Birth: October 28, 1925  [facility]) CM/SW Contact  Jon ONEIDA Anon, RN Phone Number: 04/28/2024, 1:44 PM  Clinical Narrative:    RNCM emailed TB skin test results to [facility] with [facility] to [id number] .com. Alan confirmed she received results. States that she is awaiting family to complete move in packet paperwork. Says pt can admit likely Monday or Tuesday if all paperwork is complete. States Harlene with [employer] is coordinating between pt family and facility. RNCM called Harlene at [phone], left a voicemail, awaiting a return call. ICM continuing to follow for DC needs.   Addendum: 1554: Received return phone call from [facility] with [facility] [phone]. She states that the family has completed part of the needed paperwork but pt will not likely be able to admit into the facility until Monday at the earliest. States family would like to pursue home hospice services through [facility]. RNCM sent referral to [facility], and Amy Gurrette, hospital liaison wit [facility] has accepted referral and will speak with pt family members Clara and [name] listed in pt chart.    Expected Discharge Plan: Long Term Nursing Home (Long term care level of care to be determined) Barriers to Discharge: Other (must enter comment) (patient unable to care for himself)               Expected Discharge Plan and Services In-house Referral: NA Discharge Planning Services: CM Consult   Living arrangements for the past 2 months: Single Family Home                 DME Arranged: N/A DME Agency: NA       HH Arranged: NA HH Agency: NA         Social Drivers of Health (SDOH) Interventions SDOH Screenings   Food Insecurity: Food Insecurity Present (04/23/2024)  Housing: Low Risk (04/23/2024)  Transportation Needs: No  Transportation Needs (04/23/2024)  Utilities: Not At Risk (04/23/2024)  Depression (PHQ2-9): Low Risk (01/05/2024)  Social Connections: Moderately Isolated (04/23/2024)  Tobacco Use: Low Risk (04/22/2024)    Readmission Risk Interventions    11/04/2022    1:30 PM  Readmission Risk Prevention Plan  Transportation Screening Complete  PCP or Specialist Appt within 3-5 Days Complete  HRI or Home Care Consult Complete  Social Work Consult for Recovery Care Planning/Counseling Complete  Palliative Care Screening Not Applicable  Medication Review Oceanographer) Complete

## 2024-04-28 NOTE — Progress Notes (Signed)
 [id number]  [facility] Liaison Note  Received a request from Care Manager for hospice services at [facility] after discharge. Left message for [name] and Clara to initiate education related to hospice philosophy, services and team approach to care.  Per discussion with Harlene at [employer], the plan is for discharge to via [facility] on Monday, 12/29 after hospital bed has been delivered.  DME needs discussed. Family is requesting a hospital bed and over bed table.  Please send signed and completed DNR home with the patient/family.  Please provide prescriptions at discharge as needed to ensure ongoing symptom management.  [facility] information and contact numbers given to  [place].  Above information shared with Care Manager.  Please call with any questions or concerns.  Thank you for the opportunity to participate in this patient's care.  Inocente Jacobs, BSN, RN [employer] [phone]

## 2024-04-28 NOTE — Progress Notes (Signed)
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 He was hospitalized from 11/27 through 12/19 after a fall at home.  He did not feel stable going back home to live alone however, insurance denied to pay for a skilled nursing facility.  Subjective:  Patient denies any new complaints.  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  Delirium precautions       Code Status: Limited: Do not attempt resuscitation (DNR) -DNR-LIMITED -Do Not Intubate/DNI   DVT prophylaxis:  enoxaparin  (LOVENOX ) injection 40 mg Start: 04/23/24 1000     Objective:   Vitals:   04/27/24 1420 04/27/24 1942 04/28/24 0459 04/28/24 1300  BP: (!) 146/86 (!) 139/90 (!) 154/73 115/73  Pulse: 81 67 64 75  Resp: 18 20 16 20   Temp: 98.2 F (36.8 C) 98.4 F (36.9 C) 98.1 F (36.7 C) (!) 97.5 F (36.4 C)  TempSrc: Oral Oral Oral Oral  SpO2: 97% 100% 100% 100%  Weight:      Height:       Filed Weights   04/22/24 1521  Weight: 70.3 kg   Exam: General: NAD  Cardiovascular: S1, S2 present Respiratory: CTAB Abdomen: Soft, nontender, nondistended, bowel sounds present Musculoskeletal: No bilateral pedal edema noted Skin: Normal Psychiatry: Normal mood      CBC: Recent Labs  Lab 04/22/24 1646 04/23/24 0554  WBC 8.5 8.9  NEUTROABS 5.4  --   HGB 11.6* 11.2*  HCT 36.2* 34.4*  MCV 101.7* 101.8*  PLT 167 145*   Basic Metabolic Panel: Recent Labs  Lab 04/22/24 1646 04/23/24 0554  NA 138 137  K 4.4 4.7  CL 105 105  CO2 27 25  GLUCOSE 83 95  BUN 24* 17  CREATININE 1.19 0.95  CALCIUM 9.0 8.2*     Scheduled Meds:  abiraterone  acetate  1,000 mg Oral Daily   Chlorhexidine  Gluconate Cloth  6 each Topical Daily   enoxaparin  (LOVENOX ) injection  40 mg Subcutaneous Q24H   losartan   100 mg Oral q AM   mupirocin  ointment   Topical Daily   predniSONE   5 mg Oral Q breakfast   senna-docusate  1 tablet Oral BID   tamsulosin   0.4 mg Oral q AM       Author: Lebron JINNY Cage, MD 04/28/2024 7:14 PM      "

## 2024-04-28 NOTE — Progress Notes (Signed)
" °   04/28/24 1156  PPD Results  Does patient have an induration at the injection site? No  Induration(mm) 0 mm  Name of Physician Notified Donnamarie Lebron PARAS, MD    "

## 2024-04-29 ENCOUNTER — Observation Stay (HOSPITAL_COMMUNITY)

## 2024-04-29 DIAGNOSIS — R338 Other retention of urine: Secondary | ICD-10-CM | POA: Diagnosis not present

## 2024-04-29 LAB — CREATININE, SERUM
Creatinine, Ser: 0.81 mg/dL (ref 0.61–1.24)
GFR, Estimated: >60 mL/min

## 2024-04-29 MED ORDER — BISACODYL 10 MG RE SUPP
10.0000 mg | Freq: Once | RECTAL | Status: AC
  Administered 2024-04-29: 10 mg via RECTAL
  Filled 2024-04-29: qty 1

## 2024-04-29 MED ORDER — ALUM & MAG HYDROXIDE-SIMETH 200-200-20 MG/5ML PO SUSP
30.0000 mL | Freq: Four times a day (QID) | ORAL | Status: DC | PRN
  Administered 2024-04-29: 30 mL via ORAL
  Filled 2024-04-29: qty 30

## 2024-04-29 MED ORDER — POLYETHYLENE GLYCOL 3350 17 G PO PACK
17.0000 g | PACK | Freq: Two times a day (BID) | ORAL | Status: DC
  Administered 2024-04-30 – 2024-05-01 (×2): 17 g via ORAL
  Filled 2024-04-29: qty 1

## 2024-04-29 NOTE — Progress Notes (Signed)
 Patients drivers license and blue cross blue shield medicare card provided to patient. Was left behind during previous admission.

## 2024-04-29 NOTE — Plan of Care (Signed)
" °  Problem: Education: Goal: Knowledge of General Education information will improve Description: Including pain rating scale, medication(s)/side effects and non-pharmacologic comfort measures Outcome: Progressing   Problem: Health Behavior/Discharge Planning: Goal: Ability to manage health-related needs will improve Outcome: Progressing   Problem: Clinical Measurements: Goal: Respiratory complications will improve Outcome: Progressing Goal: Cardiovascular complication will be avoided Outcome: Progressing   Problem: Activity: Goal: Risk for activity intolerance will decrease Outcome: Progressing   Problem: Nutrition: Goal: Adequate nutrition will be maintained Outcome: Progressing   Problem: Elimination: Goal: Will not experience complications related to urinary retention Outcome: Progressing   Problem: Pain Managment: Goal: General experience of comfort will improve and/or be controlled Outcome: Progressing   Problem: Safety: Goal: Ability to remain free from injury will improve Outcome: Progressing   "

## 2024-04-29 NOTE — Plan of Care (Signed)
   Problem: Pain Managment: Goal: General experience of comfort will improve and/or be controlled Outcome: Progressing   Problem: Skin Integrity: Goal: Risk for impaired skin integrity will decrease Outcome: Progressing

## 2024-04-29 NOTE — Progress Notes (Signed)
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 He was hospitalized from 11/27 through 12/19 after a fall at home.  He did not feel stable going back home to live alone however, insurance denied to pay for a skilled nursing facility.  Subjective:  Patient reported constipation, rectal discomfort.  Strict bowel regimen.  Assessment and Plan: Failure to thrive/inability to take care of self TOC to assist in finding placement for him-he will likely benefit from assisted living & he is willing to pay out-of-pocket for this  Prostate cancer metastatic to bone Zytiga  has not been restarted this admission as unavailable in our pharmacy, patient has not been able to provide his own stock.  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  Delirium precautions       Code Status: Limited: Do not attempt resuscitation (DNR) -DNR-LIMITED -Do Not Intubate/DNI   DVT prophylaxis:  enoxaparin  (LOVENOX ) injection 40 mg Start: 04/23/24 1000     Objective:   Vitals:   04/28/24 1300 04/28/24 2058 04/29/24 0609 04/29/24 1503  BP: 115/73 (!) 160/79 (!) 166/81 (!) 152/79  Pulse: 75 70 73 75  Resp: 20 18 18 18   Temp: (!) 97.5 F (36.4 C) 97.7 F (36.5 C) (!) 97.4 F (36.3 C) 98.3 F (36.8 C)  TempSrc: Oral Oral Oral Oral  SpO2: 100% 99% 98% 97%  Weight:      Height:       Filed Weights    04/22/24 1521  Weight: 70.3 kg   Exam: General: NAD  Cardiovascular: S1, S2 present Respiratory: CTAB Abdomen: Soft, nontender, nondistended, bowel sounds present Musculoskeletal: No bilateral pedal edema noted Skin: Normal Psychiatry: Normal mood     CBC: Recent Labs  Lab 04/23/24 0554  WBC 8.9  HGB 11.2*  HCT 34.4*  MCV 101.8*  PLT 145*   Basic Metabolic Panel: Recent Labs  Lab 04/23/24 0554 04/29/24 0628  NA 137  --   K 4.7  --   CL 105  --   CO2 25  --   GLUCOSE 95  --   BUN 17  --   CREATININE 0.95 0.81  CALCIUM 8.2*  --      Scheduled Meds:  abiraterone  acetate  1,000 mg Oral Daily   Chlorhexidine  Gluconate Cloth  6 each Topical Daily   enoxaparin  (LOVENOX ) injection  40 mg Subcutaneous Q24H   losartan   100 mg Oral q AM   mupirocin  ointment   Topical Daily   polyethylene glycol  17 g Oral BID   predniSONE   5 mg Oral Q breakfast   senna-docusate  1 tablet Oral BID   tamsulosin   0.4 mg Oral q AM       Author: Lebron JINNY Cage, MD 04/29/2024 7:28 PM      "

## 2024-04-30 DIAGNOSIS — R338 Other retention of urine: Secondary | ICD-10-CM | POA: Diagnosis not present

## 2024-04-30 LAB — CBC
HCT: 31 % — ABNORMAL LOW (ref 39.0–52.0)
Hemoglobin: 10.2 g/dL — ABNORMAL LOW (ref 13.0–17.0)
MCH: 32.6 pg (ref 26.0–34.0)
MCHC: 32.9 g/dL (ref 30.0–36.0)
MCV: 99 fL (ref 80.0–100.0)
Platelets: 148 K/uL — ABNORMAL LOW (ref 150–400)
RBC: 3.13 MIL/uL — ABNORMAL LOW (ref 4.22–5.81)
RDW: 14.2 % (ref 11.5–15.5)
WBC: 6.7 K/uL (ref 4.0–10.5)
nRBC: 0 % (ref 0.0–0.2)

## 2024-04-30 LAB — BASIC METABOLIC PANEL WITH GFR
Anion gap: 7 (ref 5–15)
BUN: 11 mg/dL (ref 8–23)
CO2: 26 mmol/L (ref 22–32)
Calcium: 8.5 mg/dL — ABNORMAL LOW (ref 8.9–10.3)
Chloride: 104 mmol/L (ref 98–111)
Creatinine, Ser: 0.75 mg/dL (ref 0.61–1.24)
GFR, Estimated: >60 mL/min
Glucose, Bld: 91 mg/dL (ref 70–99)
Potassium: 4.1 mmol/L (ref 3.5–5.1)
Sodium: 136 mmol/L (ref 135–145)

## 2024-04-30 MED ORDER — HYDROXYZINE HCL 10 MG PO TABS
10.0000 mg | ORAL_TABLET | Freq: Three times a day (TID) | ORAL | Status: DC | PRN
  Administered 2024-04-30 – 2024-05-01 (×2): 10 mg via ORAL
  Filled 2024-04-30 (×2): qty 1

## 2024-04-30 NOTE — Plan of Care (Signed)
   Problem: Coping: Goal: Level of anxiety will decrease Outcome: Progressing   Problem: Safety: Goal: Ability to remain free from injury will improve Outcome: Progressing   Problem: Skin Integrity: Goal: Risk for impaired skin integrity will decrease Outcome: Progressing

## 2024-04-30 NOTE — Progress Notes (Signed)
 [facility] Liaison Note   Received previous request from Jon RUMPF for hospice services at facility after discharge. Per her request, spoke with patient's niece and husband Clara and [name] to initiate education related to hospice philosophy, services, and team approach to care. Patient/family verbalized understanding of information given. Per discussion, the plan is for discharge home by [facility]AR/EMS, likely Monday.   DME needs discussed. Patient has the following equipment in the home:none   Patient/family requests the following equipment for delivery: hospital bed (full rails)    The address has been verified and is correct in the chart. Clara [phone] is the requested contact to arrange time of equipment delivery.   Please send signed and completed DNR with patient/family. Please provide prescriptions at discharge as needed to ensure ongoing symptom management.   [facility] information and contact numbers given to: Clara Above information shared with [place], ICM.   Please call with any questions or concerns. Thank you for the opportunity to participate in this patient's care.   Nat Babe, BSN, RN [employer] [phone]

## 2024-04-30 NOTE — Plan of Care (Signed)
   Problem: Education: Goal: Knowledge of General Education information will improve Description: Including pain rating scale, medication(s)/side effects and non-pharmacologic comfort measures Outcome: Progressing   Problem: Clinical Measurements: Goal: Will remain free from infection Outcome: Progressing   Problem: Coping: Goal: Level of anxiety will decrease Outcome: Progressing   Problem: Elimination: Goal: Will not experience complications related to urinary retention Outcome: Progressing   Problem: Safety: Goal: Ability to remain free from injury will improve Outcome: Progressing   Problem: Skin Integrity: Goal: Risk for impaired skin integrity will decrease Outcome: Progressing

## 2024-04-30 NOTE — Progress Notes (Signed)
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 He was hospitalized from 11/27 through 12/19 after a fall at home.  He did not feel stable going back home to live alone however, insurance denied to pay for a skilled nursing facility.  Subjective:  Today, patient denies any new complaints, having Bms and passing gas, denies any nausea/vomiting, tolerating diet very well.  Reported anxiety   Assessment and Plan: Failure to thrive/inability to take care of self TOC to assist in finding placement for him-he will likely benefit from assisted living & he is willing to pay out-of-pocket for this  Prostate cancer metastatic to bone Zytiga  has not been restarted this admission as unavailable in our pharmacy, patient has not been able to provide his own stock.  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  Tolerating diet very well Strict bowel regimen, tapwater enema  BPH Continue Flomax   Hypertension Continue losartan   Chronic thrombocytopenia  Skin tears Left hand Wound care per RN protocol  Paranoia?  Delirium precautions       Code Status: Limited: Do not attempt resuscitation (DNR) -DNR-LIMITED -Do Not Intubate/DNI   DVT prophylaxis:  enoxaparin  (LOVENOX ) injection 40 mg Start: 04/23/24 1000     Objective:   Vitals:   04/30/24 0443 04/30/24 1000 04/30/24 1255 04/30/24 1530  BP: (!) 147/81 (!) 100/51 132/81 135/64   Pulse: 72 70 74 66  Resp: 18   15  Temp: 98 F (36.7 C)   98 F (36.7 C)  TempSrc: Oral     SpO2: 97%  98% 97%  Weight:      Height:       Filed Weights   04/22/24 1521  Weight: 70.3 kg   Exam: General: NAD  Cardiovascular: S1, S2 present Respiratory: CTAB Abdomen: Soft, nontender, nondistended, bowel sounds present Musculoskeletal: No bilateral pedal edema noted Skin: Normal Psychiatry: Normal mood     CBC: Recent Labs  Lab 04/30/24 0700  WBC 6.7  HGB 10.2*  HCT 31.0*  MCV 99.0  PLT 148*   Basic Metabolic Panel: Recent Labs  Lab 04/29/24 0628 04/30/24 0700  NA  --  136  K  --  4.1  CL  --  104  CO2  --  26  GLUCOSE  --  91  BUN  --  11  CREATININE 0.81 0.75  CALCIUM  --  8.5*     Scheduled Meds:  Chlorhexidine  Gluconate Cloth  6 each Topical Daily   enoxaparin  (LOVENOX ) injection  40 mg Subcutaneous Q24H   losartan   100 mg Oral q AM   mupirocin  ointment   Topical Daily   polyethylene glycol  17 g Oral BID   senna-docusate  1 tablet Oral BID   tamsulosin   0.4 mg Oral q AM       Author: Lebron JINNY Cage, MD 04/30/2024 3:54 PM      "

## 2024-05-01 ENCOUNTER — Other Ambulatory Visit (HOSPITAL_COMMUNITY): Payer: Self-pay

## 2024-05-01 ENCOUNTER — Encounter: Payer: Self-pay | Admitting: Hematology

## 2024-05-01 DIAGNOSIS — R338 Other retention of urine: Secondary | ICD-10-CM | POA: Diagnosis not present

## 2024-05-01 MED ORDER — LOSARTAN POTASSIUM 100 MG PO TABS: 50.0000 mg | ORAL_TABLET | Freq: Every morning | ORAL | Status: AC

## 2024-05-01 MED ORDER — POLYETHYLENE GLYCOL 3350 17 GM/SCOOP PO POWD
17.0000 g | Freq: Every day | ORAL | 0 refills | Status: AC | PRN
  Filled 2024-05-01: qty 238, 14d supply, fill #0

## 2024-05-01 MED ORDER — HYDROXYZINE HCL 10 MG PO TABS
10.0000 mg | ORAL_TABLET | Freq: Two times a day (BID) | ORAL | 0 refills | Status: AC | PRN
  Filled 2024-05-01: qty 30, 15d supply, fill #0

## 2024-05-01 MED ORDER — PREDNISONE 5 MG PO TABS
5.0000 mg | ORAL_TABLET | Freq: Every day | ORAL | 0 refills | Status: DC
  Filled 2024-05-01: qty 30, 30d supply, fill #0

## 2024-05-01 MED ORDER — SENNOSIDES-DOCUSATE SODIUM 8.6-50 MG PO TABS
1.0000 | ORAL_TABLET | Freq: Two times a day (BID) | ORAL | 0 refills | Status: AC
  Filled 2024-05-01: qty 60, 30d supply, fill #0

## 2024-05-01 NOTE — TOC Progression Note (Addendum)
 Transition of Care [facility]) - Progression Note    Patient Details  Name: Alexander Rich MRN: [id number] Date of Birth: 05/11/1925  Transition of Care [facility]) CM/SW Contact  Toy LITTIE Agar, RN Phone Number:[phone]  05/01/2024, 11:24 AM  Clinical Narrative:    CM has received Addendum to FL-2 that needs to be completed and signed per MD. Message has sent message to MD for signature. Copy of chest xray will need to be fax. CM will fax both forms at the same time. Per [facility] the bed is scheduled to be delivered today but we do not have eta.   1432 Cm spoke with Alan Rigsbee at [facility] to confirm that facility has everything needed to accept patient. Alan confirms that facility can accept the patient tomorrow morning (05/02/2024).     Expected Discharge Plan: Long Term Nursing Home (Long term care level of care to be determined) Barriers to Discharge: Other (must enter comment) (patient unable to care for himself)               Expected Discharge Plan and Services In-house Referral: NA Discharge Planning Services: CM Consult   Living arrangements for the past 2 months: Single Family Home Expected Discharge Date: 05/01/24               DME Arranged: N/A DME Agency: NA       HH Arranged: NA HH Agency: NA         Social Drivers of Health (SDOH) Interventions SDOH Screenings   Food Insecurity: Food Insecurity Present (04/23/2024)  Housing: Low Risk (04/23/2024)  Transportation Needs: No Transportation Needs (04/23/2024)  Utilities: Not At Risk (04/23/2024)  Depression (PHQ2-9): Low Risk (01/05/2024)  Social Connections: Moderately Isolated (04/23/2024)  Tobacco Use: Low Risk (04/22/2024)    Readmission Risk Interventions    11/04/2022    1:30 PM  Readmission Risk Prevention Plan  Transportation Screening Complete  PCP or Specialist Appt within 3-5 Days Complete  HRI or Home Care Consult Complete  Social Work Consult for Recovery Care Planning/Counseling  Complete  Palliative Care Screening Not Applicable  Medication Review Oceanographer) Complete

## 2024-05-01 NOTE — Progress Notes (Signed)
 Alexander Rich [address] [facility] Liaison Note   Received previous request from Jon [facility]MP[facility] for hospice services at facility after discharge. Patient/family verbalized understanding of information given. Per discussion, the plan is for discharge [facility] by [facility]AR/EMS, on Tuesday morning (12/30).    Patient/family requests the following equipment for delivery: hospital bed (full rails)    Equipment was delivered to [facility] today.   Please send signed and completed DNR with patient/family. Please provide prescriptions at discharge as needed to ensure ongoing symptom management.   [facility] information and contact numbers given to: Clara Above information shared with [place], ICM.   Please call with any questions or concerns. Thank you for the opportunity to participate in this patient's care.   Inocente Jacobs, BSN, RN [employer] [phone]

## 2024-05-01 NOTE — Progress Notes (Signed)
 Discharge medications delivered to patient at the bedside in a secure bag.

## 2024-05-01 NOTE — Discharge Summary (Signed)
 " Physician Discharge Summary   Patient: Alexander Rich MRN: [id number] DOB: 03-10-1926  Admit date:     04/22/2024  Discharge date: 05/01/2024  Discharge Physician: Lebron JINNY Cage   PCP: Seabron Lenis, MD   Recommendations at discharge:   Follow-up with PCP in 1 week  Discharge Diagnoses: Principal Problem:   Acute urinary retention Active Problems:   Prostate cancer metastatic to bone [facility])   Essential hypertension   Failure to thrive in adult    Hospital Course: 89 year old male with history of duodenal ulcer, upper GI bleed, hypertension, prostate cancer metastatic to the bone who presented to the hospital for difficulty taking care of himself. He was hospitalized from 11/27 through 12/19 after a fall at home. He did not feel stable going back home to live alone however, insurance denied to pay for a skilled nursing facility.  Patient admitted for further management.    Today, patient denied any new complaints.  Met patient eating breakfast without any complaints.  Patient stable to transfer to ALF.    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  Tolerating diet very well Strict bowel regimen   BPH Continue Flomax    Hypertension Continue losartan , adjusted dose to 50 mg daily as noted some soft BP, may increase to 100 mg daily pending BP reading   Chronic thrombocytopenia   Skin tears Left hand Wound care   ??Generalized anxiety disorder Started on Atarax  as needed PCP to  follow-up      Pain control - [place]  Controlled Substance Reporting System database was reviewed. and patient was instructed, not to drive, operate heavy machinery, perform activities at heights, swimming or participation in water activities or provide baby-sitting services while on Pain, Sleep and Anxiety Medications; until their outpatient Physician has advised to do so again. Also recommended to not to take more than prescribed Pain, Sleep and Anxiety Medications.     Consultants: None Procedures performed: None Disposition: Assisted living Diet recommendation:  Cardiac diet   DISCHARGE MEDICATION: Allergies as of 05/01/2024       Reactions   Ambien  [zolpidem ] Other (See Comments)   confusion   Ibuprofen Other (See Comments)   GI bleed   Nebivolol Hcl Other (See Comments)   Felt blah; low energy   Pravastatin Other (See Comments)   Lethargy   Sertraline Hcl Other (See Comments)   Terrible feeling   Simvastatin Other (See Comments)   Lethargy   Trazodone Other (See Comments)   Foggy-headed        Medication List     TAKE these medications    abiraterone  acetate 250 MG tablet Commonly known as: ZYTIGA  Take 4 tablets (1,000 mg total) by mouth daily on an empty stomach 1 hour before meals OR 2 hours after a meal What changed:  when to take this additional instructions   acetaminophen  325 MG tablet Commonly known as: TYLENOL  Take 1 tablet (325 mg total) by mouth every 6 (six) hours as needed for mild pain (or Fever >/= 101).  cyanocobalamin  1000 MCG tablet Take 1 tablet (1,000 mcg total) by mouth daily.   feeding supplement Liqd Take 237 mLs by mouth 3 (three) times daily between meals. What changed:  when to take this reasons to take this   hydrOXYzine  10 MG tablet Commonly known as: ATARAX  Take 1 tablet (10 mg total) by mouth 2 (two) times daily as needed for anxiety.   losartan  100 MG tablet Commonly known as: COZAAR  Take 0.5 tablets  (50 mg total) by mouth in the morning. What changed: how much to take   Lubriderm Advanced Therapy Lotn Apply 1 application  topically 3 (three) times daily as needed (for skin dryness - arms and chest).   multivitamin with minerals Tabs tablet Take 1 tablet by mouth daily.   oxyCODONE  5 MG immediate release tablet Commonly known as: Oxy IR/ROXICODONE  Take 1-2 tablets (5-10 mg total) by mouth every 6 (six) hours as needed for severe pain (pain score 7-10). What changed:  when to take this additional instructions   polyethylene glycol 17 g packet Commonly known as: MIRALAX  / GLYCOLAX  Take 17 g by mouth daily as needed. Hold for diarrhea. Take when taking percocet What changed:  reasons to take this additional instructions   predniSONE  5 MG tablet Commonly known as: DELTASONE  Take 1 tablet (5 mg total) by mouth daily with breakfast.   senna-docusate 8.6-50 MG tablet Commonly known as: Senokot-S Take 1 tablet by mouth 2 (two) times daily.   Systane Complete PF 0.6 % Soln Generic drug: Propylene Glycol (PF) Place 1 drop into both eyes 4 (four) times daily as needed (for dryness).   tamsulosin  0.4 MG Caps capsule Commonly known as: FLOMAX  Take 0.4 mg by mouth in the morning.   Vitamin B Complex Tabs Take 1 tablet by mouth daily.   vitamin C 1000 MG tablet Take 1,000 mg by mouth daily.   Vitamin D3 1000 units Caps Take 1,000 Units by mouth daily.        Follow-up Information     Seabron Lenis, MD. Schedule an appointment as soon as possible for a visit in 1 week(s).   Specialty: Family Medicine Contact information: [phone] [address] [place] [address] [phone]                Discharge Exam: Fredricka Weights   04/22/24 1521  Weight: 70.3 kg   General: NAD, elderly Cardiovascular: S1, S2 present Respiratory: CTAB Abdomen: Soft, nontender, nondistended, bowel sounds present Musculoskeletal: No bilateral pedal edema noted Skin:  Normal Psychiatry: Normal mood   Condition at discharge: stable  The results of significant diagnostics from this hospitalization (including imaging, microbiology, ancillary and laboratory) are listed below for reference.   Imaging Studies: DG Abd Portable 1V Result Date: 04/29/2024 EXAM: 1 VIEW XRAY OF THE ABDOMEN 04/29/2024 06:55:00 PM COMPARISON: None available. CLINICAL HISTORY: Constipation; Fecal impaction in rectum (HCC). FINDINGS: BOWEL: Mild gaseous distention of colon in the right abdomen. There are some mildly dilated air filled small bowel loops in the right abdomen. Mild-to-moderate stool burden. SOFT TISSUES: No abnormal calcifications. BONES: No acute fracture. LUMBAR SPINE: Degenerative disc disease of the lumbar spine. Mild levocurvature of the lumbar spine. IMPRESSION: 1. Mild gaseous distention of the colon and mildly dilated air-filled small bowel loops in the right abdomen. Findings may be related to small bowel ileus or partial small bowel obstruction. 2. Mild-to-moderate stool burden. Electronically signed by: Greig Pique MD 04/29/2024 08:12 PM EST RP Workstation: [id number]   DG Chest  2 View Result Date: 04/25/2024 EXAM: 2 VIEW(S) XRAY OF THE CHEST 04/25/2024 03:00:00 PM COMPARISON: 04/04/2024 CLINICAL HISTORY: Cough FINDINGS: LUNGS AND PLEURA: Patchy bibasilar opacities, likely atelectasis . Trace bilateral pleural effusions. No pneumothorax. HEART AND MEDIASTINUM: Atherosclerotic calcifications. No acute abnormality of the cardiac and mediastinal silhouettes. BONES AND SOFT TISSUES: Multilevel degenerative changes of spine. IMPRESSION: 1. Patchy bibasilar opacities, favor atelectasis. Trace bilateral pleural effusions. Electronically signed by: Norman Gatlin MD 04/25/2024 11:15 PM EST RP Workstation: [id number]   DG ESOPHAGUS W SINGLE CM (SOL OR THIN BA) Result Date: 04/12/2024 EXAM: SINGLE CONTRAST ESOPHAGRAM 04/12/2024 03:07:00 PM TECHNIQUE: Multiple single contrast  images of the esophagus and gastroesophageal junction were obtained following the oral administration of water soluble contrast. The exam was performed in the LPO position and some in the left lateral decubitus position due to the patient's infirmity, limited range of motion, and back pain. FLUOROSCOPY DOSE AND TYPE: Radiation Dose Index: Reference Air Kerma (in mGy) = COMPARISON: None available. CLINICAL HISTORY: Dysphagia. FINDINGS: On the single contrast images, primary peristaltic waves in the esophagus were disrupted in the mid esophagus on all swallows with secondary and tertiary contractions compatible with nonspecific esophageal dysmotility disorder. We were unable to fully distend the distal esophagus due to the degree of dysmotility, and accordingly stricture of the distal most esophagus is not readily excluded. Moderate size hiatal hernia. No evidence of leak. No evidence of achalasia. No gastroesophageal reflux was elicited during examination. Penetration is suggested on image 70 series 5 with thin liquid barium, but no overt aspiration is observed. Atheromatous vascular calcification of the aortic arch. IMPRESSION: 1. 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 2. Moderate hiatal hernia. 3. Suggested penetration with thin liquid barium without overt aspiration. 4. Atheromatous vascular calcification of the aortic arch. Electronically signed by: Ryan Salvage MD 04/12/2024 03:56 PM EST RP Workstation: [id number]   ECHOCARDIOGRAM COMPLETE Result Date: 04/05/2024    ECHOCARDIOGRAM REPORT   Patient Name:   JASYN MEY Date of Exam: 04/05/2024 Medical Rec #:  [id number]       Height:       67.0 in Accession #:    [id number]      Weight:       169.3 lb Date of Birth:  09/05/1925      BSA:          1.884 m Patient Age:    89 years        BP:           128/65  mmHg Patient Gender: M               HR:           72 bpm. Exam Location:  Inpatient Procedure: 2D Echo (Both Spectral and Color Flow Doppler were utilized during            procedure). Indications:    CHF  History:        Patient has no prior history of Echocardiogram examinations.  Sonographer:    Charmaine Gaskins Referring Phys: [id number] AMRIT ADHIKARI  Sonographer Comments: Technically challenging study due to limited acoustic windows, Technically difficult study due to poor echo windows and suboptimal subcostal window. IMPRESSIONS  1. Left ventricular ejection fraction, by estimation, is 60 to 65%. Left ventricular ejection fraction by 2D MOD biplane is 62.3 %. The left ventricle has normal function. The left ventricle has no regional wall  motion abnormalities. Left ventricular diastolic parameters were normal.  2. Right ventricular systolic function is normal. The right ventricular size is normal.  3. The mitral valve is normal in structure. Mild mitral valve regurgitation. No evidence of mitral stenosis.  4. The aortic valve is normal in structure. Aortic valve regurgitation is mild. Aortic valve sclerosis/calcification is present, without any evidence of aortic stenosis.  5. There is mild dilatation of the ascending aorta, measuring 42 mm.  6. The inferior vena cava is normal in size with greater than 50% respiratory variability, suggesting right atrial pressure of 3 mmHg. Comparison(s): No prior Echocardiogram. FINDINGS  Left Ventricle: Left ventricular ejection fraction, by estimation, is 60 to 65%. Left ventricular ejection fraction by 2D MOD biplane is 62.3 %. The left ventricle has normal function. The left ventricle has no regional wall motion abnormalities. The left ventricular internal cavity size was normal in size. There is no left ventricular hypertrophy. Left ventricular diastolic parameters were normal. Right Ventricle: The right ventricular size is normal. No increase in right ventricular wall  thickness. Right ventricular systolic function is normal. Left Atrium: Left atrial size was normal in size. Right Atrium: Right atrial size was normal in size. Pericardium: There is no evidence of pericardial effusion. Mitral Valve: The mitral valve is normal in structure. Mild mitral valve regurgitation. No evidence of mitral valve stenosis. MV peak gradient, 4.6 mmHg. The mean mitral valve gradient is 1.0 mmHg. Tricuspid Valve: The tricuspid valve is normal in structure. Tricuspid valve regurgitation is not demonstrated. No evidence of tricuspid stenosis. Aortic Valve: The aortic valve is normal in structure. Aortic valve regurgitation is mild. Aortic regurgitation PHT measures 500 msec. Aortic valve sclerosis/calcification is present, without any evidence of aortic stenosis. Aortic valve mean gradient measures 10.0 mmHg. Aortic valve peak gradient measures 22.5 mmHg. Aortic valve area, by VTI measures 2.09 cm. Pulmonic Valve: The pulmonic valve was normal in structure. Pulmonic valve regurgitation is mild. No evidence of pulmonic stenosis. Aorta: The aortic root is normal in size and structure. There is mild dilatation of the ascending aorta, measuring 42 mm. Venous: The inferior vena cava is normal in size with greater than 50% respiratory variability, suggesting right atrial pressure of 3 mmHg. IAS/Shunts: No atrial level shunt detected by color flow Doppler.  LEFT VENTRICLE PLAX 2D                        Biplane EF (MOD) LVIDd:         4.30 cm         LV Biplane EF:   Left LVIDs:         2.40 cm                          ventricular LV PW:         0.90 cm                          ejection LV IVS:        0.80 cm                          fraction by LVOT diam:     2.30 cm                          2D MOD LV SV:  88                               biplane is LV SV Index:   47                               62.3 %. LVOT Area:     4.15 cm                                Diastology                                 LV e' medial:    7.62 cm/s LV Volumes (MOD)               LV E/e' medial:  10.1 LV vol d, MOD    59.3 ml       LV e' lateral:   11.50 cm/s A2C:                           LV E/e' lateral: 6.7 LV vol d, MOD    61.4 ml A4C: LV vol s, MOD    26.6 ml A2C: LV vol s, MOD    19.1 ml A4C: LV SV MOD A2C:   32.7 ml LV SV MOD A4C:   61.4 ml LV SV MOD BP:    37.9 ml RIGHT VENTRICLE RV Basal diam:  3.10 cm RV Mid diam:    3.10 cm LEFT ATRIUM             Index        RIGHT ATRIUM           Index LA diam:        3.50 cm 1.86 cm/m   RA Area:     12.60 cm LA Vol (A2C):   43.4 ml 23.04 ml/m  RA Volume:   28.20 ml  14.97 ml/m LA Vol (A4C):   61.4 ml 32.59 ml/m LA Biplane Vol: 54.3 ml 28.82 ml/m  AORTIC VALVE                     PULMONIC VALVE AV Area (Vmax):    1.91 cm      PR End Diast Vel: 4.93 msec AV Area (Vmean):   2.21 cm AV Area (VTI):     2.09 cm AV Vmax:           237.00 cm/s AV Vmean:          147.000 cm/s AV VTI:            0.420 m AV Peak Grad:      22.5 mmHg AV Mean Grad:      10.0 mmHg LVOT Vmax:         109.00 cm/s LVOT Vmean:        78.100 cm/s LVOT VTI:          0.211 m LVOT/AV VTI ratio: 0.50 AI PHT:            500 msec  AORTA Ao Root diam: 3.90 cm Ao Asc diam:  4.20 cm MITRAL VALVE                TRICUSPID VALVE MV  Area (PHT): 3.10 cm     TR Peak grad:   26.8 mmHg MV Area VTI:   2.88 cm     TR Vmax:        259.00 cm/s MV Peak grad:  4.6 mmHg MV Mean grad:  1.0 mmHg     SHUNTS MV Vmax:       1.07 m/s     Systemic VTI:  0.21 m MV Vmean:      51.9 cm/s    Systemic Diam: 2.30 cm MV E velocity: 77.10 cm/s MV A velocity: 107.00 cm/s MV E/A ratio:  0.72 Franck Azobou Tonleu Electronically signed by Joelle Cedars Tonleu Signature Date/Time: 04/05/2024/5:46:35 PM    Final    DG CHEST PORT 1 VIEW Result Date: 04/04/2024 CLINICAL DATA:  Short of breath EXAM: PORTABLE CHEST 1 VIEW COMPARISON:  Prior chest x-ray 11/02/2022 FINDINGS: Increased pulmonary vascular congestion with interstitial prominence and bibasilar  atelectasis. Findings suggest mild interstitial edema. Stable mild cardiomegaly. Atherosclerotic calcifications are present in the transverse aorta. Probable trace bilateral pleural effusions. No pneumothorax. No acute osseous abnormality. IMPRESSION: 1. Findings are most consistent with mild CHF. Stable cardiomegaly with increased pulmonary vascular congestion, mild interstitial edema and likely small bilateral pleural effusions. Electronically Signed   By: Wilkie Lent M.D.   On: 04/04/2024 09:59    Microbiology: Results for orders placed or performed during the hospital encounter of 04/22/24  Urine Culture     Status: Abnormal   Collection Time: 04/22/24 10:41 PM   Specimen: Urine, Random  Result Value Ref Range Status   Specimen Description   Final    URINE, RANDOM Performed at [facility], [address], [place], [place] [address]    Special Requests   Final    NONE Reflexed from [id number] Performed at [facility], [address] [place], [place] [address]    Culture (A)  Final    60,000 COLONIES/mL PSEUDOMONAS AERUGINOSA 80,000 COLONIES/mL ENTEROCOCCUS FAECALIS    Report Status 04/25/2024 FINAL  Final   Organism ID, Bacteria PSEUDOMONAS AERUGINOSA (A)  Final   Organism ID, Bacteria ENTEROCOCCUS FAECALIS (A)  Final      Susceptibility   Enterococcus faecalis - MIC*    AMPICILLIN <=2 SENSITIVE Sensitive     NITROFURANTOIN <=16 SENSITIVE Sensitive     VANCOMYCIN 2 SENSITIVE Sensitive     * 80,000 COLONIES/mL ENTEROCOCCUS FAECALIS   Pseudomonas aeruginosa - MIC*    MEROPENEM <=0.25 SENSITIVE Sensitive     CIPROFLOXACIN 0.12 SENSITIVE Sensitive     IMIPENEM 2 SENSITIVE Sensitive     PIP/TAZO Value in next row Sensitive      8 SENSITIVEThis is a modified FDA-approved test that has been validated and its performance characteristics determined by the reporting laboratory.  This laboratory is certified under the Clinical Laboratory  Improvement Amendments CLIA as qualified to perform high complexity clinical laboratory testing.    CEFEPIME Value in next row Sensitive      8 SENSITIVEThis is a modified FDA-approved test that has been validated and its performance characteristics determined by the reporting laboratory.  This laboratory is certified under the Clinical Laboratory Improvement Amendments CLIA as qualified to perform high complexity clinical laboratory testing.    CEFTAZIDIME/AVIBACTAM Value in next row Sensitive      8 SENSITIVEThis is a modified FDA-approved test that has been validated and its performance characteristics determined by the reporting laboratory.  This laboratory is certified under the Clinical Laboratory Improvement Amendments  CLIA as qualified to perform high complexity clinical laboratory testing.    CEFTOLOZANE/TAZOBACTAM Value in next row Sensitive      8 SENSITIVEThis is a modified FDA-approved test that has been validated and its performance characteristics determined by the reporting laboratory.  This laboratory is certified under the Clinical Laboratory Improvement Amendments CLIA as qualified to perform high complexity clinical laboratory testing.    TOBRAMYCIN Value in next row Sensitive      8 SENSITIVEThis is a modified FDA-approved test that has been validated and its performance characteristics determined by the reporting laboratory.  This laboratory is certified under the Clinical Laboratory Improvement Amendments CLIA as qualified to perform high complexity clinical laboratory testing.    CEFTAZIDIME Value in next row Sensitive      8 SENSITIVEThis is a modified FDA-approved test that has been validated and its performance characteristics determined by the reporting laboratory.  This laboratory is certified under the Clinical Laboratory Improvement Amendments CLIA as qualified to perform high complexity clinical laboratory testing.    * 60,000 COLONIES/mL PSEUDOMONAS AERUGINOSA     Labs: CBC: Recent Labs  Lab 04/30/24 0700  WBC 6.7  HGB 10.2*  HCT 31.0*  MCV 99.0  PLT 148*   Basic Metabolic Panel: Recent Labs  Lab 04/29/24 0628 04/30/24 0700  NA  --  136  K  --  4.1  CL  --  104  CO2  --  26  GLUCOSE  --  91  BUN  --  11  CREATININE 0.81 0.75  CALCIUM  --  8.5*   Liver Function Tests: No results for input(s): AST, ALT, ALKPHOS, BILITOT, PROT, ALBUMIN in the last 168 hours. CBG: No results for input(s): GLUCAP in the last 168 hours.  Discharge time spent: less than 30 minutes.  Signed: Lebron JINNY Cage, MD [facility] 05/01/2024 "

## 2024-05-01 NOTE — Plan of Care (Signed)
  Problem: Coping: Goal: Level of anxiety will decrease Outcome: Progressing   Problem: Pain Managment: Goal: General experience of comfort will improve and/or be controlled Outcome: Progressing   Problem: Safety: Goal: Ability to remain free from injury will improve Outcome: Progressing   Problem: Skin Integrity: Goal: Risk for impaired skin integrity will decrease Outcome: Progressing

## 2024-05-02 ENCOUNTER — Other Ambulatory Visit: Payer: Self-pay

## 2024-05-02 NOTE — TOC Transition Note (Signed)
 Transition of Care [facility]) - Discharge Note   Patient Details  Name: Alexander Rich MRN: [id number] Date of Birth: 1925/07/18  [facility]) CM/SW Contact:  Toy LITTIE Agar, RN Phone Number:[phone]  05/02/2024, 10:34 AM   Clinical Narrative:    Patient discharging to [facility]F. Niece [name] made aware. Transportation arranged by [employer]. Melissa with [facility] has been updated. No other inpatient care manager needs noted.    Final next level of care: Assisted Living Barriers to Discharge: No Barriers Identified, Barriers Resolved   Patient Goals and CMS Choice Patient states their goals for this hospitalization and ongoing recovery are:: Ready to discharge to ALF CMS Medicare.gov Compare Post Acute Care list provided to:: Patient        Discharge Placement              Patient chooses bed at:  ([facility]) Patient to be transferred to facility by: [facility] Name of family member notified: Clara and [name] Skye Patient and family notified of of transfer: 05/02/24  Discharge Plan and Services Additional resources added to the After Visit Summary for   In-house Referral: NA Discharge Planning Services: CM Consult            DME Arranged: Other see comment [facility] bed per hospice) DME Agency: NA       HH Arranged: NA HH Agency: NA        Social Drivers of Health (SDOH) Interventions SDOH Screenings   Food Insecurity: Food Insecurity Present (04/23/2024)  Housing: Low Risk (04/23/2024)  Transportation Needs: No Transportation Needs (04/23/2024)  Utilities: Not At Risk (04/23/2024)  Depression (PHQ2-9): Low Risk (01/05/2024)  Social Connections: Moderately Isolated (04/23/2024)  Tobacco Use: Low Risk (04/22/2024)     Readmission Risk Interventions    11/04/2022    1:30 PM  Readmission Risk Prevention Plan  Transportation Screening Complete  PCP or Specialist Appt within 3-5 Days Complete  HRI or Home Care Consult  Complete  Social Work Consult for Recovery Care Planning/Counseling Complete  Palliative Care Screening Not Applicable  Medication Review Oceanographer) Complete

## 2024-05-02 NOTE — Plan of Care (Signed)
  Problem: Education: Goal: Knowledge of General Education information will improve Description: Including pain rating scale, medication(s)/side effects and non-pharmacologic comfort measures Outcome: Progressing   Problem: Coping: Goal: Level of anxiety will decrease Outcome: Progressing   Problem: Elimination: Goal: Will not experience complications related to urinary retention Outcome: Progressing   Problem: Pain Managment: Goal: General experience of comfort will improve and/or be controlled Outcome: Progressing   Problem: Safety: Goal: Ability to remain free from injury will improve Outcome: Progressing

## 2024-05-05 ENCOUNTER — Other Ambulatory Visit: Payer: Self-pay

## 2024-05-05 NOTE — Progress Notes (Signed)
 Patient has transitioned to hospice; will receive medication through them. Dis-enrolling.

## 2024-05-08 DIAGNOSIS — E538 Deficiency of other specified B group vitamins: Secondary | ICD-10-CM

## 2024-05-08 DIAGNOSIS — C61 Malignant neoplasm of prostate: Secondary | ICD-10-CM

## 2024-05-08 NOTE — Assessment & Plan Note (Deleted)
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  I explained the logistics to him in detail, however patient is strongly against radiation therapy, he does not want to prolong his life, his goal of therapy is to improve his quality of life.  Also I think radiation will improve his pain control, he still declined RT --His leg edema has improved since he started ADT however his pain is still significant, I switched Casodex to Zytiga and prednisone, for better disease control and symptom relief. He started in June 2024. He responded well, both pain and leg edema has improved. He is tolerating well  -Due to his advanced age, limited social support, if he progressed on ADT, I would recommend hospice care.  He is not a candidate for chemotherapy

## 2024-05-08 NOTE — Progress Notes (Deleted)
 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  I explained the logistics to him in detail, however patient is strongly against radiation therapy, he does not want to prolong his life, his goal of therapy is to improve his quality of life.  Also I think radiation will improve his pain control, he still declined RT --His leg edema has improved since he started ADT however his pain is still significant, I switched Casodex  to Zytiga  and prednisone , for better disease control and symptom relief. He started in June 2024. He responded well, both pain and leg edema has improved. He is tolerating well  -Due to his advanced age, limited social support, if he progressed on ADT, I would recommend hospice care.  He is not a candidate for chemotherapy    The patient understands the plans discussed today and is in agreement with them.  He knows to contact our office if he develops concerns prior to his next appointment.  I provided *** minutes of face-to-face time during this encounter and > 50% was spent counseling as documented under my assessment and plan.    Powell FORBES Lessen, NP  [facility] - A DEPT OF [facility] [address] [place] [address] Dept: [phone] Dept Fax: [phone]   No orders of the defined types were placed in this encounter.     CHIEF COMPLAINT:  CC: Follow-up prostate cancer.  Current Treatment:   Xgeva  and B12 every 2 months Eligard  every 4 months Zytiga  250 mg and prednisone   INTERVAL HISTORY:  Maxson is here today for repeat clinical assessment. He was last seen by Lacie, NP, on 03/08/2024. Today, he is due to receive Xgeva , Eligard , and B12 injections. He denies fevers or chills. He denies pain. His appetite is good. His weight {Weight change:10426}.  I have reviewed the past medical history, past surgical history, social history and family history with the patient and they are unchanged from previous note.  ALLERGIES:  is allergic to ambien  [zolpidem ], ibuprofen, nebivolol hcl, pravastatin, sertraline hcl, simvastatin, and trazodone.  MEDICATIONS:  Current Outpatient Medications  Medication Sig Dispense Refill   abiraterone  acetate (ZYTIGA ) 250 MG tablet Take 4 tablets (1,000 mg total) by mouth daily on an empty stomach 1 hour before meals OR 2 hours after a meal (Patient taking differently: Take 1,000 mg by mouth See admin instructions. Take 1,000 mg by mouth one hour before breakfast) 120 tablet 2   acetaminophen  (TYLENOL ) 325 MG tablet Take 1 tablet (325 mg total) by mouth every 6 (six) hours as needed for mild pain (or Fever >/= 101). (Patient not taking: Reported on 04/23/2024)     Ascorbic Acid (VITAMIN C) 1000 MG tablet Take 1,000 mg by mouth daily.     B Complex  Vitamins (VITAMIN B COMPLEX) TABS Take 1 tablet by mouth daily.     Cholecalciferol (VITAMIN D3) 1000 units CAPS Take 1,000 Units by mouth daily.     cyanocobalamin  1000 MCG tablet Take 1 tablet (1,000 mcg total) by mouth daily.     Emollient (LUBRIDERM ADVANCED THERAPY) LOTN Apply 1 application  topically 3 (three) times daily as needed (for skin dryness - arms and chest).     feeding supplement  (ENSURE PLUS HIGH PROTEIN) LIQD Take 237 mLs by mouth 3 (three) times daily between meals. (Patient taking differently: Take 237 mLs by mouth daily as needed.) 21330 mL 0   hydrOXYzine  (ATARAX ) 10 MG tablet Take 1 tablet (10 mg total) by mouth 2 (two) times daily as needed for anxiety. 30 tablet 0   losartan  (COZAAR ) 100 MG tablet Take 0.5 tablets (50 mg total) by mouth in the morning.     Multiple Vitamin (MULTIVITAMIN WITH MINERALS) TABS tablet Take 1 tablet by mouth daily.     oxyCODONE  (OXY IR/ROXICODONE ) 5 MG immediate release tablet Take 1-2 tablets (5-10 mg total) by mouth every 6 (six) hours as needed for severe pain (pain score 7-10). (Patient taking differently: Take 5-10 mg by mouth See admin instructions. Take 5-10 mg by mouth in the morning & at bedtime and may take an additional 5-10 mg twice a day as needed for unresolved pain) 80 tablet 0   polyethylene glycol powder (GLYCOLAX /MIRALAX ) 17 GM/SCOOP powder Take 17 g by mouth daily as needed. Hold for diarrhea. Take when taking percocet 238 g 0   predniSONE  (DELTASONE ) 5 MG tablet Take 1 tablet (5 mg total) by mouth daily with breakfast. 30 tablet 0   senna-docusate (SENOKOT-S) 8.6-50 MG tablet Take 1 tablet by mouth 2 (two) times daily. 60 tablet 0   SYSTANE COMPLETE PF 0.6 % SOLN Place 1 drop into both eyes 4 (four) times daily as needed (for dryness).     tamsulosin  (FLOMAX ) 0.4 MG CAPS capsule Take 0.4 mg by mouth in the morning.     No current facility-administered medications for this visit.    HISTORY OF PRESENT ILLNESS:   Oncology History Overview Note   Cancer Staging  Prostate cancer metastatic to bone [facility]) Staging form: Prostate, AJCC 8th Edition - Clinical: Stage IVB (cT3, cN1, pM1c, PSA: 587) - Signed by Timmy Maude SAUNDERS, MD on 07/17/2022 Prostate specific antigen (PSA) range: 20 or greater     Prostate cancer metastatic to bone (HCC)  07/17/2022 Initial Diagnosis   Prostate cancer metastatic to bone (HCC)    07/17/2022 Cancer Staging   Staging form: Prostate, AJCC 8th Edition - Clinical: Stage IVB (cT3, cN1, pM1c, PSA: 587) - Signed by Timmy Maude SAUNDERS, MD on 07/17/2022 Prostate specific antigen (PSA) range: 20 or greater   07/17/2022 Imaging    IMPRESSION: 1. Signs of prostate cancer with nodal and bone metastasis. 2. L3 metastatic lesion with extensive soft tissue extending into a narrowed central canal. Correlate with any acute symptoms of cord compression and consider MRI for further assessment as warranted. Superior endplate deformity at L3 likely a combination of Schmorl's node and pathologic fracture. Potential for early pathologic fracture at T1 as well. 3. Soft tissue along neurovascular structures in the LEFT pelvis raising the question of sciatic nerve involvement from tumor extension in this area this patient with LEFT-sided hip pain. 4. Bulky nodal disease in the pelvis likely explains lower extremity edema. 5. Bladder wall thickening which is largely circumferential likely related to  bladder outlet obstruction with potential small bladder calculus. 6. Eccentric thickening of the sigmoid in the setting of diverticular changes is nonspecific. Correlation with Cologuard testing could be considered to determine whether further evaluation may be warranted.       REVIEW OF SYSTEMS:   Constitutional: Denies fevers, chills or abnormal weight loss Eyes: Denies blurriness of vision Ears, nose, mouth, throat, and face: Denies mucositis or sore throat Respiratory: Denies cough, dyspnea or wheezes Cardiovascular: Denies palpitation, chest discomfort or lower extremity swelling Gastrointestinal:  Denies nausea, heartburn or change in bowel habits Skin: Denies abnormal skin rashes Lymphatics: Denies new lymphadenopathy or easy bruising Neurological:Denies numbness, tingling or new weaknesses Behavioral/Psych: Mood is stable, no new changes  All other systems were reviewed with  the patient and are negative.   VITALS:  There were no vitals taken for this visit.  Wt Readings from Last 3 Encounters:  04/22/24 155 lb (70.3 kg)  03/30/24 169 lb 5 oz (76.8 kg)  03/08/24 170 lb 1.6 oz (77.2 kg)    There is no height or weight on file to calculate BMI.  Performance status (ECOG): {CHL ONC [id number]  PHYSICAL EXAM:   GENERAL:alert, no distress and comfortable SKIN: skin color, texture, turgor are normal, no rashes or significant lesions EYES: normal, Conjunctiva are pink and non-injected, sclera clear OROPHARYNX:no exudate, no erythema and lips, buccal mucosa, and tongue normal  NECK: supple, thyroid  normal size, non-tender, without nodularity LYMPH:  no palpable lymphadenopathy in the cervical, axillary or inguinal LUNGS: clear to auscultation and percussion with normal breathing effort HEART: regular rate & rhythm and no murmurs and no lower extremity edema ABDOMEN:abdomen soft, non-tender and normal bowel sounds Musculoskeletal:no cyanosis of digits and no clubbing  NEURO: alert & oriented x 3 with fluent speech, no focal motor/sensory deficits  LABORATORY DATA:  I have reviewed the data as listed    Component Value Date/Time   NA 136 04/30/2024 0700   K 4.1 04/30/2024 0700   CL 104 04/30/2024 0700   CO2 26 04/30/2024 0700   GLUCOSE 91 04/30/2024 0700   BUN 11 04/30/2024 0700   CREATININE 0.75 04/30/2024 0700   CREATININE 1.11 03/08/2024 0932   CALCIUM 8.5 (L) 04/30/2024 0700   PROT 5.2 (L) 03/31/2024 0511   ALBUMIN 2.6 (L) 03/31/2024 0511   AST 55 (H) 03/31/2024 0511   AST 21 03/08/2024 0932   ALT 19 03/31/2024 0511   ALT 9 03/08/2024 0932   ALKPHOS 38 03/31/2024 0511   BILITOT 1.3 (H) 03/31/2024 0511   BILITOT 0.9 03/08/2024 0932   GFRNONAA >60 04/30/2024 0700   GFRNONAA >60 03/08/2024 0932   GFRAA >60 09/26/2019 0929    No results found for: SPEP, UPEP  Lab Results  Component Value Date   WBC 6.7 04/30/2024   NEUTROABS 5.4  04/22/2024   HGB 10.2 (L) 04/30/2024   HCT 31.0 (L) 04/30/2024   MCV 99.0 04/30/2024   PLT 148 (L) 04/30/2024      Chemistry      Component Value Date/Time   NA 136 04/30/2024 0700   K 4.1 04/30/2024 0700   CL 104 04/30/2024 0700   CO2 26 04/30/2024 0700   BUN 11 04/30/2024 0700   CREATININE 0.75 04/30/2024 0700   CREATININE 1.11 03/08/2024 0932      Component Value Date/Time   CALCIUM 8.5 (L) 04/30/2024 0700   ALKPHOS 38 03/31/2024 0511   AST 55 (H) 03/31/2024 0511   AST 21 03/08/2024 0932  ALT 19 03/31/2024 0511   ALT 9 03/08/2024 0932   BILITOT 1.3 (H) 03/31/2024 0511   BILITOT 0.9 03/08/2024 0932       RADIOGRAPHIC STUDIES: I have personally reviewed the radiological images as listed and agreed with the findings in the report. DG Abd Portable 1V Result Date: 04/29/2024 EXAM: 1 VIEW XRAY OF THE ABDOMEN 04/29/2024 06:55:00 PM COMPARISON: None available. CLINICAL HISTORY: Constipation; Fecal impaction in rectum (HCC). FINDINGS: BOWEL: Mild gaseous distention of colon in the right abdomen. There are some mildly dilated air filled small bowel loops in the right abdomen. Mild-to-moderate stool burden. SOFT TISSUES: No abnormal calcifications. BONES: No acute fracture. LUMBAR SPINE: Degenerative disc disease of the lumbar spine. Mild levocurvature of the lumbar spine. IMPRESSION: 1. Mild gaseous distention of the colon and mildly dilated air-filled small bowel loops in the right abdomen. Findings may be related to small bowel ileus or partial small bowel obstruction. 2. Mild-to-moderate stool burden. Electronically signed by: Greig Pique MD 04/29/2024 08:12 PM EST RP Workstation: [id number]   DG Chest 2 View Result Date: 04/25/2024 EXAM: 2 VIEW(S) XRAY OF THE CHEST 04/25/2024 03:00:00 PM COMPARISON: 04/04/2024 CLINICAL HISTORY: Cough FINDINGS: LUNGS AND PLEURA: Patchy bibasilar opacities, likely atelectasis . Trace bilateral pleural effusions. No pneumothorax. HEART AND  MEDIASTINUM: Atherosclerotic calcifications. No acute abnormality of the cardiac and mediastinal silhouettes. BONES AND SOFT TISSUES: Multilevel degenerative changes of spine. IMPRESSION: 1. Patchy bibasilar opacities, favor atelectasis. Trace bilateral pleural effusions. Electronically signed by: Norman Gatlin MD 04/25/2024 11:15 PM EST RP Workstation: [id number]   DG ESOPHAGUS W SINGLE CM (SOL OR THIN BA) Result Date: 04/12/2024 EXAM: SINGLE CONTRAST ESOPHAGRAM 04/12/2024 03:07:00 PM TECHNIQUE: Multiple single contrast images of the esophagus and gastroesophageal junction were obtained following the oral administration of water soluble contrast. The exam was performed in the LPO position and some in the left lateral decubitus position due to the patient's infirmity, limited range of motion, and back pain. FLUOROSCOPY DOSE AND TYPE: Radiation Dose Index: Reference Air Kerma (in mGy) = COMPARISON: None available. CLINICAL HISTORY: Dysphagia. FINDINGS: On the single contrast images, primary peristaltic waves in the esophagus were disrupted in the mid esophagus on all swallows with secondary and tertiary contractions compatible with nonspecific esophageal dysmotility disorder. We were unable to fully distend the distal esophagus due to the degree of dysmotility, and accordingly stricture of the distal most esophagus is not readily excluded. Moderate size hiatal hernia. No evidence of leak. No evidence of achalasia. No gastroesophageal reflux was elicited during examination. Penetration is suggested on image 70 series 5 with thin liquid barium, but no overt aspiration is observed. Atheromatous vascular calcification of the aortic arch. IMPRESSION: 1. 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 2. Moderate hiatal hernia. 3. Suggested penetration with thin liquid barium  without overt aspiration. 4. Atheromatous vascular calcification of the aortic arch. Electronically signed by: Ryan Salvage MD 04/12/2024 03:56 PM EST RP Workstation: [id number]   "

## 2024-05-09 ENCOUNTER — Telehealth: Payer: Self-pay

## 2024-05-09 ENCOUNTER — Other Ambulatory Visit: Payer: Self-pay

## 2024-05-09 ENCOUNTER — Inpatient Hospital Stay: Admitting: Nurse Practitioner

## 2024-05-09 ENCOUNTER — Inpatient Hospital Stay: Attending: Hematology

## 2024-05-09 ENCOUNTER — Inpatient Hospital Stay

## 2024-05-09 NOTE — Telephone Encounter (Signed)
 Pt niece advised us  he is in  [facility]. Contacted  [facility] [phone] regarding how to proceed with appointments due to missed appointment. Spoke with front desk and they advised he was not there and transferred me to another facility within their community and again was advised he was not there and stated he was at [facility]. They gave me a direct number to the nurse Melissa at [facility] community [phone]. Attempted to contact [facility] but got no answer.Left a voicemail regarding what's going on and to call us  back in a timely manner.

## 2024-05-09 NOTE — Telephone Encounter (Signed)
 Called pt in regards to a missed appt, pt did not answer left a voicemail stating   to call back to reschedule and with any further questions.

## 2024-05-09 NOTE — Telephone Encounter (Signed)
 Spoke with pt's nephew-in-law regarding pt's appts today.  Pt's nephew-in-law stated that the pt is on Hospice w/in [facility].

## 2024-05-15 ENCOUNTER — Other Ambulatory Visit: Payer: Self-pay | Admitting: Hematology

## 2024-06-04 DEATH — deceased
